# Patient Record
Sex: Male | Born: 1955 | Race: White | Hispanic: No | Marital: Married | Smoking: Never smoker
Health system: Southern US, Community
[De-identification: ages and names within clinical notes are randomized; demographics above are authoritative.]

## PROBLEM LIST (undated history)

## (undated) DIAGNOSIS — M1990 Unspecified osteoarthritis, unspecified site: Secondary | ICD-10-CM

## (undated) DIAGNOSIS — N319 Neuromuscular dysfunction of bladder, unspecified: Secondary | ICD-10-CM

## (undated) DIAGNOSIS — R112 Nausea with vomiting, unspecified: Secondary | ICD-10-CM

## (undated) DIAGNOSIS — S82892A Other fracture of left lower leg, initial encounter for closed fracture: Secondary | ICD-10-CM

## (undated) DIAGNOSIS — K219 Gastro-esophageal reflux disease without esophagitis: Secondary | ICD-10-CM

## (undated) DIAGNOSIS — M4856XA Collapsed vertebra, not elsewhere classified, lumbar region, initial encounter for fracture: Secondary | ICD-10-CM

## (undated) DIAGNOSIS — M79643 Pain in unspecified hand: Secondary | ICD-10-CM

## (undated) DIAGNOSIS — D649 Anemia, unspecified: Secondary | ICD-10-CM

## (undated) DIAGNOSIS — I341 Nonrheumatic mitral (valve) prolapse: Secondary | ICD-10-CM

## (undated) DIAGNOSIS — K592 Neurogenic bowel, not elsewhere classified: Secondary | ICD-10-CM

## (undated) DIAGNOSIS — Z9889 Other specified postprocedural states: Secondary | ICD-10-CM

## (undated) DIAGNOSIS — T4145XA Adverse effect of unspecified anesthetic, initial encounter: Secondary | ICD-10-CM

## (undated) DIAGNOSIS — Z978 Presence of other specified devices: Secondary | ICD-10-CM

## (undated) DIAGNOSIS — I4510 Unspecified right bundle-branch block: Secondary | ICD-10-CM

## (undated) DIAGNOSIS — T8859XA Other complications of anesthesia, initial encounter: Secondary | ICD-10-CM

## (undated) HISTORY — PX: TONSILLECTOMY: SUR1361

## (undated) HISTORY — PX: STRABISMUS SURGERY: SHX218

## (undated) HISTORY — PX: KNEE SURGERY: SHX244

---
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
## 1998-12-15 HISTORY — PX: CERVICAL FUSION: SHX112

## 2011-02-03 ENCOUNTER — Other Ambulatory Visit: Payer: Self-pay | Admitting: Orthopedic Surgery

## 2011-02-03 DIAGNOSIS — M25512 Pain in left shoulder: Secondary | ICD-10-CM

## 2011-02-06 ENCOUNTER — Ambulatory Visit
Admission: RE | Admit: 2011-02-06 | Discharge: 2011-02-06 | Disposition: A | Discharge status: Home / Self Care | Payer: PRIVATE HEALTH INSURANCE | Source: Ambulatory Visit | Attending: Orthopedic Surgery | Admitting: Orthopedic Surgery

## 2011-02-06 DIAGNOSIS — M25512 Pain in left shoulder: Secondary | ICD-10-CM

## 2019-10-15 ENCOUNTER — Emergency Department (HOSPITAL_COMMUNITY): Payer: Commercial Managed Care - PPO

## 2019-10-15 ENCOUNTER — Encounter (HOSPITAL_COMMUNITY): Payer: Self-pay | Admitting: Neurological Surgery

## 2019-10-15 ENCOUNTER — Other Ambulatory Visit: Payer: Self-pay

## 2019-10-15 ENCOUNTER — Inpatient Hospital Stay (HOSPITAL_COMMUNITY)
Admission: EM | Admit: 2019-10-15 | Discharge: 2019-10-25 | DRG: 456 | Disposition: A | Discharge status: Rehabilitation Facility | Payer: Commercial Managed Care - PPO | Attending: Surgery | Admitting: Surgery

## 2019-10-15 ENCOUNTER — Inpatient Hospital Stay (HOSPITAL_COMMUNITY): Payer: Commercial Managed Care - PPO

## 2019-10-15 DIAGNOSIS — S32012A Unstable burst fracture of first lumbar vertebra, initial encounter for closed fracture: Principal | ICD-10-CM | POA: Diagnosis present

## 2019-10-15 DIAGNOSIS — S82235A Nondisplaced oblique fracture of shaft of left tibia, initial encounter for closed fracture: Secondary | ICD-10-CM | POA: Diagnosis present

## 2019-10-15 DIAGNOSIS — Z791 Long term (current) use of non-steroidal anti-inflammatories (NSAID): Secondary | ICD-10-CM

## 2019-10-15 DIAGNOSIS — S32001A Stable burst fracture of unspecified lumbar vertebra, initial encounter for closed fracture: Secondary | ICD-10-CM

## 2019-10-15 DIAGNOSIS — S43015A Anterior dislocation of left humerus, initial encounter: Secondary | ICD-10-CM | POA: Diagnosis present

## 2019-10-15 DIAGNOSIS — S32009A Unspecified fracture of unspecified lumbar vertebra, initial encounter for closed fracture: Secondary | ICD-10-CM | POA: Diagnosis present

## 2019-10-15 DIAGNOSIS — Z09 Encounter for follow-up examination after completed treatment for conditions other than malignant neoplasm: Secondary | ICD-10-CM

## 2019-10-15 DIAGNOSIS — E46 Unspecified protein-calorie malnutrition: Secondary | ICD-10-CM | POA: Diagnosis not present

## 2019-10-15 DIAGNOSIS — G834 Cauda equina syndrome: Secondary | ICD-10-CM | POA: Diagnosis present

## 2019-10-15 DIAGNOSIS — Z800 Family history of malignant neoplasm of digestive organs: Secondary | ICD-10-CM

## 2019-10-15 DIAGNOSIS — S82892A Other fracture of left lower leg, initial encounter for closed fracture: Secondary | ICD-10-CM

## 2019-10-15 DIAGNOSIS — R739 Hyperglycemia, unspecified: Secondary | ICD-10-CM | POA: Diagnosis not present

## 2019-10-15 DIAGNOSIS — S343XXD Injury of cauda equina, subsequent encounter: Secondary | ICD-10-CM | POA: Diagnosis not present

## 2019-10-15 DIAGNOSIS — D62 Acute posthemorrhagic anemia: Secondary | ICD-10-CM | POA: Diagnosis not present

## 2019-10-15 DIAGNOSIS — S43006A Unspecified dislocation of unspecified shoulder joint, initial encounter: Secondary | ICD-10-CM

## 2019-10-15 DIAGNOSIS — Z9889 Other specified postprocedural states: Secondary | ICD-10-CM

## 2019-10-15 DIAGNOSIS — K5900 Constipation, unspecified: Secondary | ICD-10-CM | POA: Diagnosis not present

## 2019-10-15 DIAGNOSIS — Z880 Allergy status to penicillin: Secondary | ICD-10-CM | POA: Diagnosis not present

## 2019-10-15 DIAGNOSIS — T1490XA Injury, unspecified, initial encounter: Secondary | ICD-10-CM

## 2019-10-15 DIAGNOSIS — K592 Neurogenic bowel, not elsewhere classified: Secondary | ICD-10-CM | POA: Diagnosis present

## 2019-10-15 DIAGNOSIS — Z20828 Contact with and (suspected) exposure to other viral communicable diseases: Secondary | ICD-10-CM | POA: Diagnosis present

## 2019-10-15 DIAGNOSIS — M48061 Spinal stenosis, lumbar region without neurogenic claudication: Secondary | ICD-10-CM | POA: Diagnosis present

## 2019-10-15 DIAGNOSIS — W11XXXA Fall on and from ladder, initial encounter: Secondary | ICD-10-CM | POA: Diagnosis present

## 2019-10-15 DIAGNOSIS — M19041 Primary osteoarthritis, right hand: Secondary | ICD-10-CM | POA: Diagnosis present

## 2019-10-15 DIAGNOSIS — M19042 Primary osteoarthritis, left hand: Secondary | ICD-10-CM | POA: Diagnosis present

## 2019-10-15 DIAGNOSIS — S82852A Displaced trimalleolar fracture of left lower leg, initial encounter for closed fracture: Secondary | ICD-10-CM | POA: Diagnosis present

## 2019-10-15 DIAGNOSIS — Y92239 Unspecified place in hospital as the place of occurrence of the external cause: Secondary | ICD-10-CM | POA: Diagnosis not present

## 2019-10-15 DIAGNOSIS — S24103A Unspecified injury at T7-T10 level of thoracic spinal cord, initial encounter: Secondary | ICD-10-CM | POA: Diagnosis not present

## 2019-10-15 DIAGNOSIS — N390 Urinary tract infection, site not specified: Secondary | ICD-10-CM | POA: Diagnosis not present

## 2019-10-15 DIAGNOSIS — Z981 Arthrodesis status: Secondary | ICD-10-CM

## 2019-10-15 DIAGNOSIS — N319 Neuromuscular dysfunction of bladder, unspecified: Secondary | ICD-10-CM | POA: Diagnosis present

## 2019-10-15 DIAGNOSIS — S82832A Other fracture of upper and lower end of left fibula, initial encounter for closed fracture: Secondary | ICD-10-CM

## 2019-10-15 DIAGNOSIS — K644 Residual hemorrhoidal skin tags: Secondary | ICD-10-CM | POA: Diagnosis present

## 2019-10-15 DIAGNOSIS — I341 Nonrheumatic mitral (valve) prolapse: Secondary | ICD-10-CM | POA: Diagnosis present

## 2019-10-15 DIAGNOSIS — S43005A Unspecified dislocation of left shoulder joint, initial encounter: Secondary | ICD-10-CM

## 2019-10-15 DIAGNOSIS — T380X5A Adverse effect of glucocorticoids and synthetic analogues, initial encounter: Secondary | ICD-10-CM | POA: Diagnosis not present

## 2019-10-15 DIAGNOSIS — R339 Retention of urine, unspecified: Secondary | ICD-10-CM | POA: Diagnosis present

## 2019-10-15 DIAGNOSIS — Z79899 Other long term (current) drug therapy: Secondary | ICD-10-CM

## 2019-10-15 DIAGNOSIS — S343XXA Injury of cauda equina, initial encounter: Secondary | ICD-10-CM

## 2019-10-15 DIAGNOSIS — S32011D Stable burst fracture of first lumbar vertebra, subsequent encounter for fracture with routine healing: Secondary | ICD-10-CM | POA: Diagnosis not present

## 2019-10-15 DIAGNOSIS — G8918 Other acute postprocedural pain: Secondary | ICD-10-CM | POA: Diagnosis not present

## 2019-10-15 DIAGNOSIS — S32001S Stable burst fracture of unspecified lumbar vertebra, sequela: Secondary | ICD-10-CM | POA: Diagnosis not present

## 2019-10-15 DIAGNOSIS — Z8249 Family history of ischemic heart disease and other diseases of the circulatory system: Secondary | ICD-10-CM

## 2019-10-15 DIAGNOSIS — I454 Nonspecific intraventricular block: Secondary | ICD-10-CM | POA: Diagnosis present

## 2019-10-15 DIAGNOSIS — S064X0A Epidural hemorrhage without loss of consciousness, initial encounter: Secondary | ICD-10-CM | POA: Diagnosis present

## 2019-10-15 DIAGNOSIS — T148XXA Other injury of unspecified body region, initial encounter: Secondary | ICD-10-CM

## 2019-10-15 DIAGNOSIS — M792 Neuralgia and neuritis, unspecified: Secondary | ICD-10-CM | POA: Diagnosis not present

## 2019-10-15 DIAGNOSIS — Z820 Family history of epilepsy and other diseases of the nervous system: Secondary | ICD-10-CM | POA: Diagnosis not present

## 2019-10-15 DIAGNOSIS — E8809 Other disorders of plasma-protein metabolism, not elsewhere classified: Secondary | ICD-10-CM | POA: Diagnosis not present

## 2019-10-15 DIAGNOSIS — Z419 Encounter for procedure for purposes other than remedying health state, unspecified: Secondary | ICD-10-CM

## 2019-10-15 DIAGNOSIS — M549 Dorsalgia, unspecified: Secondary | ICD-10-CM

## 2019-10-15 DIAGNOSIS — S43492A Other sprain of left shoulder joint, initial encounter: Secondary | ICD-10-CM

## 2019-10-15 DIAGNOSIS — S43492S Other sprain of left shoulder joint, sequela: Secondary | ICD-10-CM | POA: Diagnosis not present

## 2019-10-15 HISTORY — DX: Unspecified right bundle-branch block: I45.10

## 2019-10-15 HISTORY — DX: Nausea with vomiting, unspecified: R11.2

## 2019-10-15 HISTORY — DX: Other specified postprocedural states: Z98.890

## 2019-10-15 HISTORY — DX: Pain in unspecified hand: M79.643

## 2019-10-15 HISTORY — DX: Other complications of anesthesia, initial encounter: T88.59XA

## 2019-10-15 HISTORY — DX: Unspecified injury at T7-t10 level of thoracic spinal cord, initial encounter: S24.103A

## 2019-10-15 HISTORY — DX: Nonrheumatic mitral (valve) prolapse: I34.1

## 2019-10-15 LAB — COMPREHENSIVE METABOLIC PANEL
ALT: 28 U/L (ref 0–44)
AST: 36 U/L (ref 15–41)
Albumin: 3.9 g/dL (ref 3.5–5.0)
Alkaline Phosphatase: 53 U/L (ref 38–126)
Anion gap: 9 (ref 5–15)
BUN: 23 mg/dL (ref 8–23)
CO2: 26 mmol/L (ref 22–32)
Calcium: 9.2 mg/dL (ref 8.9–10.3)
Chloride: 106 mmol/L (ref 98–111)
Creatinine, Ser: 1 mg/dL (ref 0.61–1.24)
GFR calc Af Amer: >60 mL/min (ref >60)
GFR calc non Af Amer: >60 mL/min (ref >60)
Glucose, Bld: 120 mg/dL — ABNORMAL HIGH (ref 70–99)
Potassium: 3.4 mmol/L — ABNORMAL LOW (ref 3.5–5.1)
Sodium: 141 mmol/L (ref 135–145)
Total Bilirubin: 0.6 mg/dL (ref 0.3–1.2)
Total Protein: 6.1 g/dL — ABNORMAL LOW (ref 6.5–8.1)

## 2019-10-15 LAB — SAMPLE TO BLOOD BANK

## 2019-10-15 LAB — CBC
HCT: 39.7 % (ref 39.0–52.0)
Hemoglobin: 13.5 g/dL (ref 13.0–17.0)
MCH: 31.5 pg (ref 26.0–34.0)
MCHC: 34 g/dL (ref 30.0–36.0)
MCV: 92.8 fL (ref 80.0–100.0)
Platelets: 264 10*3/uL (ref 150–400)
RBC: 4.28 MIL/uL (ref 4.22–5.81)
RDW: 12.8 % (ref 11.5–15.5)
WBC: 16 10*3/uL — ABNORMAL HIGH (ref 4.0–10.5)
nRBC: 0 % (ref 0.0–0.2)

## 2019-10-15 LAB — I-STAT CHEM 8, ED
BUN: 22 mg/dL (ref 8–23)
Calcium, Ion: 1.12 mmol/L — ABNORMAL LOW (ref 1.15–1.40)
Chloride: 105 mmol/L (ref 98–111)
Creatinine, Ser: 0.9 mg/dL (ref 0.61–1.24)
Glucose, Bld: 117 mg/dL — ABNORMAL HIGH (ref 70–99)
HCT: 38 % — ABNORMAL LOW (ref 39.0–52.0)
Hemoglobin: 12.9 g/dL — ABNORMAL LOW (ref 13.0–17.0)
Potassium: 3.5 mmol/L (ref 3.5–5.1)
Sodium: 140 mmol/L (ref 135–145)
TCO2: 26 mmol/L (ref 22–32)

## 2019-10-15 LAB — PROTIME-INR
INR: 1.1 (ref 0.8–1.2)
Prothrombin Time: 14.1 seconds (ref 11.4–15.2)

## 2019-10-15 LAB — ETHANOL: Alcohol, Ethyl (B): <10 mg/dL (ref <10)

## 2019-10-15 LAB — SARS CORONAVIRUS 2 BY RT PCR (HOSPITAL ORDER, PERFORMED IN ~~LOC~~ HOSPITAL LAB): SARS Coronavirus 2: NEGATIVE

## 2019-10-15 LAB — CDS SEROLOGY

## 2019-10-15 LAB — LACTIC ACID, PLASMA: Lactic Acid, Venous: 0.9 mmol/L (ref 0.5–1.9)

## 2019-10-15 MED ORDER — ONDANSETRON HCL 4 MG/2ML IJ SOLN
4.0000 mg | INTRAMUSCULAR | Status: DC | PRN
  Administered 2019-10-16: 4 mg via INTRAVENOUS
  Filled 2019-10-15 (×2): qty 2

## 2019-10-15 MED ORDER — LORAZEPAM 2 MG/ML IJ SOLN
1.0000 mg | Freq: Once | INTRAMUSCULAR | Status: AC | PRN
  Administered 2019-10-15: 1 mg via INTRAVENOUS
  Filled 2019-10-15: qty 1

## 2019-10-15 MED ORDER — KETAMINE HCL 10 MG/ML IJ SOLN
INTRAMUSCULAR | Status: AC | PRN
  Administered 2019-10-15: 40 mg via INTRAVENOUS
  Administered 2019-10-15: 20 mg via INTRAVENOUS
  Administered 2019-10-15: 40 mg via INTRAVENOUS

## 2019-10-15 MED ORDER — PROMETHAZINE HCL 25 MG/ML IJ SOLN
25.0000 mg | Freq: Once | INTRAMUSCULAR | Status: AC
  Administered 2019-10-15: 25 mg via INTRAVENOUS
  Filled 2019-10-15: qty 1

## 2019-10-15 MED ORDER — IOHEXOL 300 MG/ML  SOLN
100.0000 mL | Freq: Once | INTRAMUSCULAR | Status: AC | PRN
  Administered 2019-10-15: 100 mL via INTRAVENOUS

## 2019-10-15 MED ORDER — PROPOFOL 10 MG/ML IV BOLUS
60.0000 mg | Freq: Once | INTRAVENOUS | Status: DC
  Filled 2019-10-15: qty 20

## 2019-10-15 MED ORDER — FENTANYL CITRATE (PF) 100 MCG/2ML IJ SOLN
INTRAMUSCULAR | Status: AC | PRN
  Administered 2019-10-15: 50 ug via INTRAVENOUS

## 2019-10-15 MED ORDER — KETAMINE HCL 50 MG/5ML IJ SOSY
1.0000 mg/kg | PREFILLED_SYRINGE | Freq: Once | INTRAMUSCULAR | Status: DC
  Filled 2019-10-15: qty 10

## 2019-10-15 MED ORDER — KETAMINE HCL 50 MG/5ML IJ SOSY
0.3000 mg/kg | PREFILLED_SYRINGE | Freq: Once | INTRAMUSCULAR | Status: AC
  Administered 2019-10-15: 21 mg via INTRAVENOUS
  Filled 2019-10-15: qty 5

## 2019-10-15 MED ORDER — ONDANSETRON HCL 4 MG/2ML IJ SOLN
4.0000 mg | Freq: Once | INTRAMUSCULAR | Status: AC
  Administered 2019-10-15: 16:00:00 4 mg via INTRAVENOUS

## 2019-10-15 MED ORDER — HYDROMORPHONE HCL 1 MG/ML IJ SOLN
1.0000 mg | INTRAMUSCULAR | Status: DC | PRN
  Administered 2019-10-16 (×3): 1 mg via INTRAVENOUS
  Filled 2019-10-15 (×3): qty 1

## 2019-10-15 MED ORDER — PROPOFOL 10 MG/ML IV BOLUS
INTRAVENOUS | Status: AC | PRN
  Administered 2019-10-15 (×2): 40 mg via INTRAVENOUS

## 2019-10-15 MED ORDER — ONDANSETRON HCL 4 MG/2ML IJ SOLN
INTRAMUSCULAR | Status: AC
  Administered 2019-10-15: 4 mg via INTRAVENOUS
  Filled 2019-10-15: qty 2

## 2019-10-15 NOTE — ED Provider Notes (Signed)
[facility] EMERGENCY DEPARTMENT Provider Note   CSN: [id number] Arrival date & time: 10/15/19  1528     History   Chief Complaint Chief Complaint  Patient presents with  . Fall    HPI Torris Matsuoka is a 63 y.o. male.     HPI   Patient presents today after a fall while working on a 20 foot ladder, with a fall that was estimated to be about 12 feet. He apparently fell with a ladder underneath him, on his back, with parts of his body on the pavement. He states his last tetanus shot was 2 years ago. He is currently complaining of pain to his left shoulder as well as his left lower extremity. He received 250 mcg of fentanyl from EMS as well as 4 mg Zofran. He arrives to the ED GCS 15, satting well on room air, hemodynamically stable.  History reviewed. No pertinent past medical history.  Patient Active Problem List   Diagnosis Date Noted  . Fracture of lumbar spine without lesion of spinal cord (HCC) 10/15/2019    History reviewed. No pertinent surgical history.    Home Medications    Prior to Admission medications   Medication Sig Start Date End Date Taking? Authorizing Provider  ibuprofen (ADVIL) 200 MG tablet Take 400 mg by mouth every 6 (six) hours as needed for headache.   Yes [provider]  Multiple Vitamins-Minerals (MULTIVITAMIN ADULT PO) Take 1 tablet by mouth daily.   Yes [provider]  Naproxen Sod-diphenhydrAMINE (ALEVE PM PO) Take 2 tablets by mouth as needed (sleep).   Yes [provider]    Family History History reviewed. No pertinent family history.  Social History Social History   Tobacco Use  . Smoking status: Not on file  Substance Use Topics  . Alcohol use: Not on file  . Drug use: Not on file     Allergies   Penicillins   Review of Systems Review of Systems  Constitutional: Negative for fever.  Eyes: Negative for visual disturbance.  Respiratory: Negative for cough and shortness of breath.    Cardiovascular: Negative for chest pain.  Gastrointestinal: Negative for abdominal pain, nausea and vomiting.  Genitourinary: Negative for hematuria.  Musculoskeletal: Positive for back pain and gait problem.  Skin: Positive for wound. Negative for rash.  Neurological: Negative for syncope and headaches.  All other systems reviewed and are negative.    Physical Exam Updated Vital Signs BP 138/68   Pulse 70   Temp (!) 97.1 F (36.2 C) (Temporal)   Resp 13   Ht 5' 11.5" (1.816 m)   Wt 70.3 kg   SpO2 100%   BMI 21.32 kg/m   Physical Exam Vitals signs and nursing note reviewed.  Constitutional:      Appearance: He is well-developed.  HENT:     Head: Normocephalic and atraumatic.  Eyes:     Extraocular Movements: Extraocular movements intact.     Conjunctiva/sclera: Conjunctivae normal.     Pupils: Pupils are equal, round, and reactive to light.  Neck:     Musculoskeletal: Neck supple.     Comments: Cervical collar in place Cardiovascular:     Rate and Rhythm: Normal rate and regular rhythm.     Pulses: Normal pulses.     Heart sounds: No murmur.     Comments: 2+ DP pulses bilaterally, 2+ radial pulses bilaterally Pulmonary:     Effort: Pulmonary effort is normal. No respiratory distress.  Breath sounds: Normal breath sounds.     Comments: Chest wall stable to AP and lateral compression, satting well on room air Abdominal:     General: There is no distension.     Palpations: Abdomen is soft.     Tenderness: There is no abdominal tenderness. There is no right CVA tenderness, left CVA tenderness or guarding.     Comments: No flank tenderness to palpation  Genitourinary:    Comments: Pelvis stable to AP and lateral compression Musculoskeletal:        General: Deformity present.     Comments: Abrasion to left lower extremity, obvious deformity to the left lower extremity  Skin:    General: Skin is warm and dry.  Neurological:     Mental Status: He is alert and  oriented to person, place, and time.     Comments: No midline spinal tenderness to palpation, able to move all 4 extremities spontaneously      ED Treatments / Results  Labs (all labs ordered are listed, but only abnormal results are displayed) Labs Reviewed  COMPREHENSIVE METABOLIC PANEL - Abnormal; Notable for the following components:      Result Value   Potassium 3.4 (*)    Glucose, Bld 120 (*)    Total Protein 6.1 (*)    All other components within normal limits  CBC - Abnormal; Notable for the following components:   WBC 16.0 (*)    All other components within normal limits  I-STAT CHEM 8, ED - Abnormal; Notable for the following components:   Glucose, Bld 117 (*)    Calcium, Ion 1.12 (*)    Hemoglobin 12.9 (*)    HCT 38.0 (*)    All other components within normal limits  SARS CORONAVIRUS 2 BY RT PCR (HOSPITAL ORDER, PERFORMED IN [place] [facility])  CDS SEROLOGY  ETHANOL  LACTIC ACID, PLASMA  PROTIME-INR  URINALYSIS, ROUTINE W REFLEX MICROSCOPIC  HIV ANTIBODY (ROUTINE TESTING W REFLEX)  SAMPLE TO BLOOD BANK    EKG None  Radiology Dg Tibia/fibula Left  Result Date: 10/15/2019 CLINICAL DATA:  Pt arrives via EMS from home with reports of working on a 20 ft ladder when it slipped out and he fell on his back on the pavement. Estimated falling 12 ft. Denies LOC. Deformity to left ankle and dislocation to left shoulder EXAM: LEFT TIBIA AND FIBULA - 2 VIEW COMPARISON:  None. FINDINGS: Ankle fractures. There is a transverse fracture across the distal fibula, at the metadiaphysis. There is an oblique fracture across the medial aspect of the distal tibia extending to the medial tibial plafond, with flexure displaced proximally 5 mm medially and superiorly. There is widening of the lateral margin of the ankle joint. No talar subluxation. No additional fractures.  Knee joint normally aligned. 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  IMPRESSION: 1. Ankle fractures with oblique fracture across the medial distal tibia, above the base of the medial malleolus, and a lateral fracture of the distal fibula. 2. No other fractures.  No dislocation. 3. 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 Electronically Signed   By: Amie Portland M.D.   On: 10/15/2019 16:19   Dg Ankle Complete Left  Result Date: 10/15/2019 CLINICAL DATA:  Status post reduction EXAM: LEFT ANKLE COMPLETE - 3+ VIEW COMPARISON:  October 15, 2019 FINDINGS: The patient has been placed in a cast. The patient's trimalleolar fracture demonstrates similar alignment in the interval. IMPRESSION: The patient has been placed in a  cast. No significant change in alignment. Electronically Signed   By: Gerome Sam III M.D   On: 10/15/2019 18:13   Ct Head Wo Contrast  Result Date: 10/15/2019 CLINICAL DATA:  Was working on a 20 foot ladder at home when it slipped out and he fell onto his back on pavement, estimated fall height 12 feet, denies loss of consciousness EXAM: CT HEAD WITHOUT CONTRAST CT CERVICAL SPINE WITHOUT CONTRAST TECHNIQUE: Multidetector CT imaging of the head and cervical spine was performed following the standard protocol without intravenous contrast. Multiplanar CT image reconstructions of the cervical spine were also generated. COMPARISON:  None FINDINGS: CT HEAD FINDINGS Brain: Normal ventricular morphology. No midline shift or mass effect. Normal appearance of brain parenchyma. No intracranial hemorrhage, mass lesion or evidence of acute infarction. No extra-axial fluid collections. Vascular: Minimal atherosclerotic calcification of internal carotid and vertebral arteries at skull base Skull: Intact Sinuses/Orbits: Clear Other: N/A CT CERVICAL SPINE FINDINGS Alignment: Normal Skull base and vertebrae: Mild osseous demineralization. Skull base intact. Prior anterior fusion C5-C7 with good bony fusion. Vertebral body heights maintained. Disc space narrowing greatest at C4-C5  with minimal retrolisthesis. Mild scattered facet degenerative changes greatest at C7-T1. No fracture, additional subluxation or bone destruction. Or Soft tissues and spinal canal: Prevertebral soft tissues normal thickness. Remaining visualized cervical soft tissues unremarkable. Disc levels:  No specific abnormalities Upper chest: Lung apices clear Other: N/A IMPRESSION: No acute intracranial abnormalities. Prior anterior C5-C7 cervical spine fusion. Mild scattered degenerative disc and facet disease changes cervical spine. No acute cervical spine abnormalities. Electronically Signed   By: Ulyses Southward M.D.   On: 10/15/2019 16:54   Ct Chest W Contrast  Result Date: 10/15/2019 CLINICAL DATA:  Larey Seat off ladder today. EXAM: CT CHEST, ABDOMEN, AND PELVIS WITH CONTRAST TECHNIQUE: Multidetector CT imaging of the chest, abdomen and pelvis was performed following the standard protocol during bolus administration of intravenous contrast. CONTRAST:  OMNIPAQUE IOHEXOL 300 MG/ML  SOLN COMPARISON:  None. FINDINGS: CT CHEST FINDINGS Cardiovascular: The heart is normal in size. No pericardial effusion. Mild tortuosity, ectasia and calcification of the thoracic aorta but no aneurysm or dissection. The branch vessels are patent. Scattered coronary artery calcifications are noted. Mediastinum/Nodes: No mediastinal or hilar mass or lymphadenopathy. No mediastinal hematoma. The esophagus is grossly normal. Lungs/Pleura: No acute pulmonary findings. No pulmonary contusions or pneumothorax. Dependent bibasilar subpleural atelectasis. No pleural effusion. No worrisome pulmonary lesions. A few small pulmonary nodules are likely benign. Mild bronchiectasis is noted. No interstitial lung disease. Musculoskeletal: No chest wall mass, supraclavicular or axillary adenopathy. The thoracic vertebral bodies are normally aligned. No acute fracture. The sternum is intact. CT ABDOMEN PELVIS FINDINGS Hepatobiliary: No focal hepatic lesions  or acute hepatic injury. No perihepatic fluid collections. The gallbladder is normal. No common bile duct dilatation. Pancreas: No mass, inflammation or ductal dilatation. No acute injury or peripancreatic fluid collection. Spleen: Normal size. No acute injury or perisplenic fluid collection. Adrenals/Urinary Tract: The adrenal glands and kidneys are unremarkable. No acute renal injury or perinephric fluid collection. The bladder is unremarkable. Stomach/Bowel: The stomach, duodenum, small bowel and colon are grossly normal without oral contrast. No obvious acute inflammatory process, mass lesion or obstructive findings. No free air is identified. Vascular/Lymphatic: Moderate atherosclerotic calcifications involving the distal aorta and iliac arteries. No aneurysm or dissection. The major venous structures are patent. No mesenteric or retroperitoneal mass, adenopathy or hematoma. Reproductive: The prostate gland and seminal vesicles are unremarkable. Other: No free pelvic fluid  collections, pelvic hematoma or inguinal mass. Musculoskeletal: There is a significant burst type fracture of L1 with retropulsion of the posterosuperior aspect of the vertebral body into the spinal canal. There is approximately 50-60% canal compromise. The pedicles are intact but there is a fracture extending through the left lamina and the left facet joint and left transverse process. I do not see any definite fractures above or below this level. Paraspinal hematoma is noted along with some blood extending up into the retrocrural space. The posterior ribs are intact. IMPRESSION: 1. Significant burst type fracture of L1 with retropulsion and spinal canal compromise. 2. There is also involvement of the posterior elements on the left side. 3. No acute intra-abdominal/intrapelvic injury is identified. The solid abdominal organs are intact and no definite findings for bowel injury. 4. Bibasilar atelectasis but no acute lung injury. 5. Normal  appearance of the heart and great vessels. These results were called by telephone at the time of interpretation on 10/15/2019 at 4:57 pm to provider [facility] , who verbally acknowledged these results. Electronically Signed   By: Rudie Meyer M.D.   On: 10/15/2019 16:58   Ct Cervical Spine Wo Contrast  Result Date: 10/15/2019 CLINICAL DATA:  Was working on a 20 foot ladder at home when it slipped out and he fell onto his back on pavement, estimated fall height 12 feet, denies loss of consciousness EXAM: CT HEAD WITHOUT CONTRAST CT CERVICAL SPINE WITHOUT CONTRAST TECHNIQUE: Multidetector CT imaging of the head and cervical spine was performed following the standard protocol without intravenous contrast. Multiplanar CT image reconstructions of the cervical spine were also generated. COMPARISON:  None FINDINGS: CT HEAD FINDINGS Brain: Normal ventricular morphology. No midline shift or mass effect. Normal appearance of brain parenchyma. No intracranial hemorrhage, mass lesion or evidence of acute infarction. No extra-axial fluid collections. Vascular: Minimal atherosclerotic calcification of internal carotid and vertebral arteries at skull base Skull: Intact Sinuses/Orbits: Clear Other: N/A CT CERVICAL SPINE FINDINGS Alignment: Normal Skull base and vertebrae: Mild osseous demineralization. Skull base intact. Prior anterior fusion C5-C7 with good bony fusion. Vertebral body heights maintained. Disc space narrowing greatest at C4-C5 with minimal retrolisthesis. Mild scattered facet degenerative changes greatest at C7-T1. No fracture, additional subluxation or bone destruction. Or Soft tissues and spinal canal: Prevertebral soft tissues normal thickness. Remaining visualized cervical soft tissues unremarkable. Disc levels:  No specific abnormalities Upper chest: Lung apices clear Other: N/A IMPRESSION: No acute intracranial abnormalities. Prior anterior C5-C7 cervical spine fusion. Mild scattered degenerative  disc and facet disease changes cervical spine. No acute cervical spine abnormalities. Electronically Signed   By: Ulyses Southward M.D.   On: 10/15/2019 16:54   Mr Lumbar Spine Wo Contrast  Result Date: 10/15/2019 CLINICAL DATA:  12 feet fall from ladder. L1 burst fracture. EXAM: MRI LUMBAR SPINE WITHOUT CONTRAST TECHNIQUE: Multiplanar, multisequence MR imaging of the lumbar spine was performed. No intravenous contrast was administered. COMPARISON:  CT of the abdomen and pelvis contrast 10/15/2019 FINDINGS: Segmentation: 5 non rib-bearing lumbar type vertebral bodies are present. The lowest fully formed vertebral body is L5. Alignment: There straightening of the normal lumbar lordosis. No significant listhesis is present. Vertebrae: L1 burst fracture is again noted. Retropulsed bone and epidural hematoma result in central canal stenosis at the L1 level. This is at the tip of the conus medullaris. A more subtle superior endplate fractures present at L3. There is some edema within the upper half of the vertebral body. Minimally displaced superior endplate compression fracture  is more evident right than left. Marrow signal and vertebral body heights are preserved at L2, L4, and L5. Conus medullaris and cauda equina: Conus extends to the L1 level. Conus and cauda equina appear normal. Paraspinal and other soft tissues: There is a thin paraspinal hematoma no impact is present at L1. On the retroperitoneum. Visualized intra-abdominal organs are within normal limits. Paraspinous musculature is unremarkable. Disc levels: Moderate to severe central canal stenosis is present at L1 secondary to retropulsion of bone fragments and epidural hematoma. The epidural hematoma extends to the L2 vertebral body. L1-2: Moderate left subarticular narrowing is secondary to the epidural hematoma. Foramina are patent. L2-3: Mild broad-based disc bulge is present. No significant stenosis is present. L3-4: There is desiccation of the disc  consent loss of height without significant stenosis. L4-5: Mild disc bulging is present at. A far right annular tear is noted. There is no significant stenosis. L5-S1: Negative. IMPRESSION: 1. L1 burst fracture with retropulsion of bone fragments and epidural hematoma resulting in moderate to severe central canal stenosis at L1. 2. Minimally displaced acute superior endplate compression fracture at L3 without retropulsed bone or significant stenosis. 3. Moderate left subarticular stenosis at L1-2 secondary to the epidural hematoma. 4. Mild disc bulging at L2-3 and L3-4 without significant stenosis at either level. Electronically Signed   By: Marin Roberts M.D.   On: 10/15/2019 20:40   Ct Abdomen Pelvis W Contrast  Result Date: 10/15/2019 CLINICAL DATA:  Larey Seat off ladder today. EXAM: CT CHEST, ABDOMEN, AND PELVIS WITH CONTRAST TECHNIQUE: Multidetector CT imaging of the chest, abdomen and pelvis was performed following the standard protocol during bolus administration of intravenous contrast. CONTRAST:  OMNIPAQUE IOHEXOL 300 MG/ML  SOLN COMPARISON:  None. FINDINGS: CT CHEST FINDINGS Cardiovascular: The heart is normal in size. No pericardial effusion. Mild tortuosity, ectasia and calcification of the thoracic aorta but no aneurysm or dissection. The branch vessels are patent. Scattered coronary artery calcifications are noted. Mediastinum/Nodes: No mediastinal or hilar mass or lymphadenopathy. No mediastinal hematoma. The esophagus is grossly normal. Lungs/Pleura: No acute pulmonary findings. No pulmonary contusions or pneumothorax. Dependent bibasilar subpleural atelectasis. No pleural effusion. No worrisome pulmonary lesions. A few small pulmonary nodules are likely benign. Mild bronchiectasis is noted. No interstitial lung disease. Musculoskeletal: No chest wall mass, supraclavicular or axillary adenopathy. The thoracic vertebral bodies are normally aligned. No acute fracture. The sternum is  intact. CT ABDOMEN PELVIS FINDINGS Hepatobiliary: No focal hepatic lesions or acute hepatic injury. No perihepatic fluid collections. The gallbladder is normal. No common bile duct dilatation. Pancreas: No mass, inflammation or ductal dilatation. No acute injury or peripancreatic fluid collection. Spleen: Normal size. No acute injury or perisplenic fluid collection. Adrenals/Urinary Tract: The adrenal glands and kidneys are unremarkable. No acute renal injury or perinephric fluid collection. The bladder is unremarkable. Stomach/Bowel: The stomach, duodenum, small bowel and colon are grossly normal without oral contrast. No obvious acute inflammatory process, mass lesion or obstructive findings. No free air is identified. Vascular/Lymphatic: Moderate atherosclerotic calcifications involving the distal aorta and iliac arteries. No aneurysm or dissection. The major venous structures are patent. No mesenteric or retroperitoneal mass, adenopathy or hematoma. Reproductive: The prostate gland and seminal vesicles are unremarkable. Other: No free pelvic fluid collections, pelvic hematoma or inguinal mass. Musculoskeletal: There is a significant burst type fracture of L1 with retropulsion of the posterosuperior aspect of the vertebral body into the spinal canal. There is approximately 50-60% canal compromise. The pedicles are intact but there is  a fracture extending through the left lamina and the left facet joint and left transverse process. I do not see any definite fractures above or below this level. Paraspinal hematoma is noted along with some blood extending up into the retrocrural space. The posterior ribs are intact. IMPRESSION: 1. Significant burst type fracture of L1 with retropulsion and spinal canal compromise. 2. There is also involvement of the posterior elements on the left side. 3. No acute intra-abdominal/intrapelvic injury is identified. The solid abdominal organs are intact and no definite findings for  bowel injury. 4. Bibasilar atelectasis but no acute lung injury. 5. Normal appearance of the heart and great vessels. These results were called by telephone at the time of interpretation on 10/15/2019 at 4:57 pm to provider [facility] , who verbally acknowledged these results. Electronically Signed   By: Rudie Meyer M.D.   On: 10/15/2019 16:58   Dg Pelvis Portable  Result Date: 10/15/2019 CLINICAL DATA:  Fall off ladder from 12 ft height. Pelvic pain. Initial encounter. EXAM: PORTABLE PELVIS 1-2 VIEWS COMPARISON:  None. FINDINGS: There is no evidence of pelvic fracture or diastasis. No pelvic bone lesions are seen. IMPRESSION: Negative. Electronically Signed   By: Danae Orleans M.D.   On: 10/15/2019 16:18   Dg Chest Port 1 View  Result Date: 10/15/2019 CLINICAL DATA:  Fall off ladder from 12 ft height. Chest pain. Left shoulder dislocation. Initial encounter. EXAM: PORTABLE CHEST 1 VIEW COMPARISON:  None. FINDINGS: The heart size and mediastinal contours are within normal limits. No evidence of pneumothorax or hemothorax. Both lungs are clear. Left shoulder dislocation noted. Cervical spine fusion hardware is seen. IMPRESSION: No active cardiopulmonary disease. Left shoulder dislocation. Electronically Signed   By: Danae Orleans M.D.   On: 10/15/2019 16:20   Dg Shoulder Left Portable  Result Date: 10/15/2019 CLINICAL DATA:  Post reduction images. EXAM: LEFT SHOULDER - 1 VIEW COMPARISON:  Pre reduction images, 10/15/2019 at 3:49 p.m. FINDINGS: Humeral head has been realigned with the glenoid. No fracture is seen. IMPRESSION: Successful reduction of the left shoulder dislocation. Electronically Signed   By: Amie Portland M.D.   On: 10/15/2019 18:06   Dg Shoulder Left Portable  Result Date: 10/15/2019 CLINICAL DATA:  Fall off ladder from 12 ft height. Left shoulder pain. Initial encounter. EXAM: LEFT SHOULDER - 1 VIEW COMPARISON:  None. FINDINGS: Anterior dislocation of the humeral head is seen.  No fracture or other bone lesions identified. IMPRESSION: Anterior shoulder dislocation.  No evidence of fracture. Electronically Signed   By: Danae Orleans M.D.   On: 10/15/2019 16:19    Procedures .Sedation  Date/Time: 10/15/2019 4:53 PM Performed by: Chester Holstein, MD Authorized by: Derwood Kaplan, MD   Consent:    Consent obtained:  Written and verbal   Consent given by:  Patient and spouse   Risks discussed:  Respiratory compromise necessitating ventilatory assistance and intubation, nausea, dysrhythmia and vomiting Universal protocol:    Immediately prior to procedure a time out was called: yes   Indications:    Procedure performed:  Fracture reduction Pre-sedation assessment:    Time since last food or drink:  4   ASA classification: class 1 - normal, healthy patient     Neck mobility: reduced (Cervical collar in place)     Mouth opening:  3 or more finger widths   Thyromental distance:  4 finger widths   Mallampati score:  I - soft palate, uvula, fauces, pillars visible   Pre-sedation assessments completed and  reviewed: airway patency, cardiovascular function, hydration status, nausea/vomiting and pain level     Pre-sedation assessment completed:  10/15/2019 4:54 PM Immediate pre-procedure details:    Reassessment: Patient reassessed immediately prior to procedure     Reviewed: vital signs     Verified: bag valve mask available, intubation equipment available, IV patency confirmed, oxygen available and suction available   Procedure details (see MAR for exact dosages):    Preoxygenation:  Nasal cannula   Sedation:  Propofol and ketamine   Intended level of sedation: deep   Intra-procedure monitoring:  Blood pressure monitoring, continuous pulse oximetry, cardiac monitor, frequent LOC assessments and frequent vital sign checks   Total Provider sedation time (minutes):  20 Post-procedure details:    Post-sedation assessment completed:  10/15/2019 6:00 PM    Post-sedation assessments completed and reviewed: airway patency, mental status, nausea/vomiting, pain level and respiratory function     Patient is stable for discharge or admission: yes     Patient tolerance:  Tolerated well, no immediate complications Reduction of dislocation  Date/Time: 10/15/2019 4:55 PM Performed by: Chester Holstein, MD Authorized by: Derwood Kaplan, MD  Consent: Verbal consent obtained. Consent given by: patient and spouse Patient understanding: patient states understanding of the procedure being performed Patient consent: the patient's understanding of the procedure matches consent given Procedure consent: procedure consent matches procedure scheduled Required items: required blood products, implants, devices, and special equipment available Patient identity confirmed: verbally with patient and arm band Time out: Immediately prior to procedure a "time out" was called to verify the correct patient, procedure, equipment, support staff and site/side marked as required. Local anesthesia used: no  Anesthesia: Local anesthesia used: no  Sedation: Patient sedated: yes Sedation type: moderate (conscious) sedation Sedatives: propofol Analgesia: ketamine Vitals: Vital signs were monitored during sedation.  Patient tolerance: patient tolerated the procedure well with no immediate complications  Reduction of fracture  Date/Time: 10/15/2019 5:46 PM Performed by: Chester Holstein, MD Authorized by: Derwood Kaplan, MD  Consent: Verbal consent obtained. Consent given by: patient and spouse Patient understanding: patient states understanding of the procedure being performed Patient consent: the patient's understanding of the procedure matches consent given Required items: required blood products, implants, devices, and special equipment available Patient identity confirmed: verbally with patient and arm band Time out: Immediately prior to procedure a "time out"  was called to verify the correct patient, procedure, equipment, support staff and site/side marked as required. Local anesthesia used: no  Anesthesia: Local anesthesia used: no  Sedation: Patient sedated: yes Sedatives: propofol Analgesia: ketamine Vitals: Vital signs were monitored during sedation.  Patient tolerance: patient tolerated the procedure well with no immediate complications    (including critical care time)  Medications Ordered in ED Medications  HYDROmorphone (DILAUDID) injection 1 mg (has no administration in time range)  ondansetron (ZOFRAN) injection 4 mg (has no administration in time range)  ketamine 50 mg in normal saline 5 mL (10 mg/mL) syringe (21 mg Intravenous Given 10/15/19 1548)  ondansetron (ZOFRAN) injection 4 mg (4 mg Intravenous Given 10/15/19 1549)  fentaNYL (SUBLIMAZE) injection (50 mcg Intravenous Given 10/15/19 1535)  promethazine (PHENERGAN) injection 25 mg (25 mg Intravenous Given 10/15/19 1614)  iohexol (OMNIPAQUE) 300 MG/ML solution 100 mL (100 mLs Intravenous Contrast Given 10/15/19 1628)  ketamine (KETALAR) injection (40 mg Intravenous Given 10/15/19 1732)  propofol (DIPRIVAN) 10 mg/mL bolus/IV push (40 mg Intravenous Given 10/15/19 1732)  promethazine (PHENERGAN) injection 25 mg (25 mg Intravenous Given 10/15/19 1913)  LORazepam (ATIVAN) injection 1  mg (1 mg Intravenous Given 10/15/19 1959)     Initial Impression / Assessment and Plan / ED Course  I have reviewed the triage vital signs and the nursing notes.  Pertinent labs & imaging results that were available during my care of the patient were reviewed by me and considered in my medical decision making (see chart for details).        Amorion Oberlin is a 63 y.o. male presents today after a fall from 12 feet.  Trauma scans ordered, IV pain medication, nausea medication given soon after arrival.  Cervical collar in placed.    Labs are consistent with trauma, imaging consistent with  anterior shoulder dislocation, left trimalleolar fracture and tib-fib fracture.  No open fractures.  Shoulder dislocation reduced per above procedure note.  Left lower extremity fractures splinted.    Imaging also showed a bony retropulsion of L1.  Neurosurgery consulted, and they recommended admission with likely intervention in the morning.  Trauma and orthopedics called.  Admitted to the trauma service.  Care of patient was discussed with the supervising  Final Clinical Impressions(s) / ED Diagnoses   Final diagnoses:  Trauma  Shoulder dislocation, left, initial encounter  Closed burst fracture of lumbar vertebra, initial encounter (HCC)  Closed fracture of left ankle, initial encounter  Closed nondisplaced oblique fracture of shaft of left tibia, initial encounter  Closed fracture of distal end of left fibula, unspecified fracture morphology, initial encounter    ED Discharge Orders    None       Chester Holstein, MD 10/15/19 2322    Derwood Kaplan, MD 10/16/19 1943

## 2019-10-15 NOTE — Progress Notes (Signed)
Orthopedic Tech Progress Note Patient Details:  Louis Jordan 10/10/1956 [id number]  Ortho Devices Ortho Device/Splint Location: level 2 Trauma       [place] Pink 10/15/2019, 3:45 PM

## 2019-10-15 NOTE — Consult Note (Signed)
Reason for Consult: L1 burst fracture Referring Physician: EDP  Louis Jordan is an 63 y.o. male.   HPI:  63 year old gentleman who apparently fell 12 feet from a ladder and has suffered a left shoulder dislocation, left ankle fracture, and an L1 burst fracture.  The patient is sedated and cannot operate with history and physical exam.  EDP assures me that the patient had normal sensation and normal strength prior to sedation for reduction of his dislocation and fracture.  Was complaining of back pain but no leg pain.  History reviewed. No pertinent past medical history.  History reviewed. No pertinent surgical history.  Allergies  Allergen Reactions  . Penicillins     Social History   Tobacco Use  . Smoking status: Not on file  Substance Use Topics  . Alcohol use: Not on file    History reviewed. No pertinent family history.   Review of Systems  Positive ROS: Negative per his wife  All other systems have been reviewed and were otherwise negative with the exception of those mentioned in the HPI and as above.  Objective: Vital signs in last 24 hours: Temp:  [97.1 F (36.2 C)] 97.1 F (36.2 C) (10/31 1533) Pulse Rate:  [66-76] 72 (10/31 1735) Resp:  [8-26] 8 (10/31 1735) BP: (148-179)/(71-86) 159/85 (10/31 1735) SpO2:  [93 %-100 %] 99 % (10/31 1735) Weight:  [70.3 kg] 70.3 kg (10/31 1540)  General Appearance: Lethargic, unable to cooperate with exam, no distress, appears stated age Head: Normocephalic, without obvious abnormality, atraumatic Eyes: PERRL, conjunctiva/corneas clear, gaze conjugate Ears: Normal TM's and external ear canals, both ears Throat: benign Neck: In collar Lungs: respirations unlabored Heart: Regular rate and rhythm Abdomen: Soft Extremities: Deformity of left ankle Pulses: 2+ and symmetric all extremities Skin: Skin color, texture, turgor normal, no rashes or lesions  NEUROLOGIC:   Mental status: Unable to fully assess because of  medications Motor Exam - grossly normal, normal tone and bulk cording to the EDP but unable to assess at present Sensory Exam -unable to assess Reflexes: symmetric, no pathologic reflexes, No Hoffman's, No clonus Coordination -unable to assess Gait -unable to assess Balance -unable to assess Cranial nerves I: smell Not tested  II: visual acuity  OS: na    OD: na  II: visual fields   II: pupils Equal, round, reactive to light  III,VII: ptosis None  III,IV,VI: extraocular muscles  Full ROM  V: mastication Normal  V: facial light touch sensation  Normal  V,VII: corneal reflex  Present  VII: facial muscle function - upper  Normal  VII: facial muscle function - lower Normal  VIII: hearing Not tested  IX: soft palate elevation  Normal  IX,X: gag reflex Present  XI: trapezius strength  5/5  XI: sternocleidomastoid strength 5/5  XI: neck flexion strength  5/5  XII: tongue strength  Normal    Data Review Lab Results  Component Value Date   WBC 16.0 (H) 10/15/2019   HGB 12.9 (L) 10/15/2019   HCT 38.0 (L) 10/15/2019   MCV 92.8 10/15/2019   PLT 264 10/15/2019   Lab Results  Component Value Date   NA 140 10/15/2019   K 3.5 10/15/2019   CL 105 10/15/2019   CO2 26 10/15/2019   BUN 22 10/15/2019   CREATININE 0.90 10/15/2019   GLUCOSE 117 (H) 10/15/2019   Lab Results  Component Value Date   INR 1.1 10/15/2019    Radiology: Dg Tibia/fibula Left  Result Date: 10/15/2019 CLINICAL DATA:  Pt arrives via EMS from home with reports of working on a 20 ft ladder when it slipped out and he fell on his back on the pavement. Estimated falling 12 ft. Denies LOC. Deformity to left ankle and dislocation to left shoulder EXAM: LEFT TIBIA AND FIBULA - 2 VIEW COMPARISON:  None. FINDINGS: Ankle fractures. There is a transverse fracture across the distal fibula, at the metadiaphysis. There is an oblique fracture across the medial aspect of the distal tibia extending to the medial tibial plafond,  with flexure displaced proximally 5 mm medially and superiorly. There is widening of the lateral margin of the ankle joint. No talar subluxation. No additional fractures.  Knee joint normally aligned. 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 IMPRESSION: 1. Ankle fractures with oblique fracture across the medial distal tibia, above the base of the medial malleolus, and a lateral fracture of the distal fibula. 2. No other fractures.  No dislocation. 3. 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 Electronically Signed   By: Amie Portlandavid  Ormond M.D.   On: 10/15/2019 16:19   Ct Head Wo Contrast  Result Date: 10/15/2019 CLINICAL DATA:  Was working on a 20 foot ladder at home when it slipped out and he fell onto his back on pavement, estimated fall height 12 feet, denies loss of consciousness EXAM: CT HEAD WITHOUT CONTRAST CT CERVICAL SPINE WITHOUT CONTRAST TECHNIQUE: Multidetector CT imaging of the head and cervical spine was performed following the standard protocol without intravenous contrast. Multiplanar CT image reconstructions of the cervical spine were also generated. COMPARISON:  None FINDINGS: CT HEAD FINDINGS Brain: Normal ventricular morphology. No midline shift or mass effect. Normal appearance of brain parenchyma. No intracranial hemorrhage, mass lesion or evidence of acute infarction. No extra-axial fluid collections. Vascular: Minimal atherosclerotic calcification of internal carotid and vertebral arteries at skull base Skull: Intact Sinuses/Orbits: Clear Other: N/A CT CERVICAL SPINE FINDINGS Alignment: Normal Skull base and vertebrae: Mild osseous demineralization. Skull base intact. Prior anterior fusion C5-C7 with good bony fusion. Vertebral body heights maintained. Disc space narrowing greatest at C4-C5 with minimal retrolisthesis. Mild scattered facet degenerative changes greatest at C7-T1. No fracture, additional subluxation or bone destruction. Or Soft tissues and spinal canal:  Prevertebral soft tissues normal thickness. Remaining visualized cervical soft tissues unremarkable. Disc levels:  No specific abnormalities Upper chest: Lung apices clear Other: N/A IMPRESSION: No acute intracranial abnormalities. Prior anterior C5-C7 cervical spine fusion. Mild scattered degenerative disc and facet disease changes cervical spine. No acute cervical spine abnormalities. Electronically Signed   By: Ulyses SouthwardMark  Boles M.D.   On: 10/15/2019 16:54   Ct Chest W Contrast  Result Date: 10/15/2019 CLINICAL DATA:  Larey SeatFell off ladder today. EXAM: CT CHEST, ABDOMEN, AND PELVIS WITH CONTRAST TECHNIQUE: Multidetector CT imaging of the chest, abdomen and pelvis was performed following the standard protocol during bolus administration of intravenous contrast. CONTRAST:  100mL OMNIPAQUE IOHEXOL 300 MG/ML  SOLN COMPARISON:  None. FINDINGS: CT CHEST FINDINGS Cardiovascular: The heart is normal in size. No pericardial effusion. Mild tortuosity, ectasia and calcification of the thoracic aorta but no aneurysm or dissection. The branch vessels are patent. Scattered coronary artery calcifications are noted. Mediastinum/Nodes: No mediastinal or hilar mass or lymphadenopathy. No mediastinal hematoma. The esophagus is grossly normal. Lungs/Pleura: No acute pulmonary findings. No pulmonary contusions or pneumothorax. Dependent bibasilar subpleural atelectasis. No pleural effusion. No worrisome pulmonary lesions. A few small pulmonary nodules are likely benign. Mild bronchiectasis is noted. No interstitial lung disease. Musculoskeletal: No chest wall  mass, supraclavicular or axillary adenopathy. The thoracic vertebral bodies are normally aligned. No acute fracture. The sternum is intact. CT ABDOMEN PELVIS FINDINGS Hepatobiliary: No focal hepatic lesions or acute hepatic injury. No perihepatic fluid collections. The gallbladder is normal. No common bile duct dilatation. Pancreas: No mass, inflammation or ductal dilatation. No acute  injury or peripancreatic fluid collection. Spleen: Normal size. No acute injury or perisplenic fluid collection. Adrenals/Urinary Tract: The adrenal glands and kidneys are unremarkable. No acute renal injury or perinephric fluid collection. The bladder is unremarkable. Stomach/Bowel: The stomach, duodenum, small bowel and colon are grossly normal without oral contrast. No obvious acute inflammatory process, mass lesion or obstructive findings. No free air is identified. Vascular/Lymphatic: Moderate atherosclerotic calcifications involving the distal aorta and iliac arteries. No aneurysm or dissection. The major venous structures are patent. No mesenteric or retroperitoneal mass, adenopathy or hematoma. Reproductive: The prostate gland and seminal vesicles are unremarkable. Other: No free pelvic fluid collections, pelvic hematoma or inguinal mass. Musculoskeletal: There is a significant burst type fracture of L1 with retropulsion of the posterosuperior aspect of the vertebral body into the spinal canal. There is approximately 50-60% canal compromise. The pedicles are intact but there is a fracture extending through the left lamina and the left facet joint and left transverse process. I do not see any definite fractures above or below this level. Paraspinal hematoma is noted along with some blood extending up into the retrocrural space. The posterior ribs are intact. IMPRESSION: 1. Significant burst type fracture of L1 with retropulsion and spinal canal compromise. 2. There is also involvement of the posterior elements on the left side. 3. No acute intra-abdominal/intrapelvic injury is identified. The solid abdominal organs are intact and no definite findings for bowel injury. 4. Bibasilar atelectasis but no acute lung injury. 5. Normal appearance of the heart and great vessels. These results were called by telephone at the time of interpretation on 10/15/2019 at 4:57 pm to provider [facility] , who verbally  acknowledged these results. Electronically Signed   By: Rudie Meyer M.D.   On: 10/15/2019 16:58   Ct Cervical Spine Wo Contrast  Result Date: 10/15/2019 CLINICAL DATA:  Was working on a 20 foot ladder at home when it slipped out and he fell onto his back on pavement, estimated fall height 12 feet, denies loss of consciousness EXAM: CT HEAD WITHOUT CONTRAST CT CERVICAL SPINE WITHOUT CONTRAST TECHNIQUE: Multidetector CT imaging of the head and cervical spine was performed following the standard protocol without intravenous contrast. Multiplanar CT image reconstructions of the cervical spine were also generated. COMPARISON:  None FINDINGS: CT HEAD FINDINGS Brain: Normal ventricular morphology. No midline shift or mass effect. Normal appearance of brain parenchyma. No intracranial hemorrhage, mass lesion or evidence of acute infarction. No extra-axial fluid collections. Vascular: Minimal atherosclerotic calcification of internal carotid and vertebral arteries at skull base Skull: Intact Sinuses/Orbits: Clear Other: N/A CT CERVICAL SPINE FINDINGS Alignment: Normal Skull base and vertebrae: Mild osseous demineralization. Skull base intact. Prior anterior fusion C5-C7 with good bony fusion. Vertebral body heights maintained. Disc space narrowing greatest at C4-C5 with minimal retrolisthesis. Mild scattered facet degenerative changes greatest at C7-T1. No fracture, additional subluxation or bone destruction. Or Soft tissues and spinal canal: Prevertebral soft tissues normal thickness. Remaining visualized cervical soft tissues unremarkable. Disc levels:  No specific abnormalities Upper chest: Lung apices clear Other: N/A IMPRESSION: No acute intracranial abnormalities. Prior anterior C5-C7 cervical spine fusion. Mild scattered degenerative disc and facet disease changes cervical spine.  No acute cervical spine abnormalities. Electronically Signed   By: Ulyses Southward M.D.   On: 10/15/2019 16:54   Ct Abdomen Pelvis W  Contrast  Result Date: 10/15/2019 CLINICAL DATA:  Larey Seat off ladder today. EXAM: CT CHEST, ABDOMEN, AND PELVIS WITH CONTRAST TECHNIQUE: Multidetector CT imaging of the chest, abdomen and pelvis was performed following the standard protocol during bolus administration of intravenous contrast. CONTRAST:  OMNIPAQUE IOHEXOL 300 MG/ML  SOLN COMPARISON:  None. FINDINGS: CT CHEST FINDINGS Cardiovascular: The heart is normal in size. No pericardial effusion. Mild tortuosity, ectasia and calcification of the thoracic aorta but no aneurysm or dissection. The branch vessels are patent. Scattered coronary artery calcifications are noted. Mediastinum/Nodes: No mediastinal or hilar mass or lymphadenopathy. No mediastinal hematoma. The esophagus is grossly normal. Lungs/Pleura: No acute pulmonary findings. No pulmonary contusions or pneumothorax. Dependent bibasilar subpleural atelectasis. No pleural effusion. No worrisome pulmonary lesions. A few small pulmonary nodules are likely benign. Mild bronchiectasis is noted. No interstitial lung disease. Musculoskeletal: No chest wall mass, supraclavicular or axillary adenopathy. The thoracic vertebral bodies are normally aligned. No acute fracture. The sternum is intact. CT ABDOMEN PELVIS FINDINGS Hepatobiliary: No focal hepatic lesions or acute hepatic injury. No perihepatic fluid collections. The gallbladder is normal. No common bile duct dilatation. Pancreas: No mass, inflammation or ductal dilatation. No acute injury or peripancreatic fluid collection. Spleen: Normal size. No acute injury or perisplenic fluid collection. Adrenals/Urinary Tract: The adrenal glands and kidneys are unremarkable. No acute renal injury or perinephric fluid collection. The bladder is unremarkable. Stomach/Bowel: The stomach, duodenum, small bowel and colon are grossly normal without oral contrast. No obvious acute inflammatory process, mass lesion or obstructive findings. No free air is  identified. Vascular/Lymphatic: Moderate atherosclerotic calcifications involving the distal aorta and iliac arteries. No aneurysm or dissection. The major venous structures are patent. No mesenteric or retroperitoneal mass, adenopathy or hematoma. Reproductive: The prostate gland and seminal vesicles are unremarkable. Other: No free pelvic fluid collections, pelvic hematoma or inguinal mass. Musculoskeletal: There is a significant burst type fracture of L1 with retropulsion of the posterosuperior aspect of the vertebral body into the spinal canal. There is approximately 50-60% canal compromise. The pedicles are intact but there is a fracture extending through the left lamina and the left facet joint and left transverse process. I do not see any definite fractures above or below this level. Paraspinal hematoma is noted along with some blood extending up into the retrocrural space. The posterior ribs are intact. IMPRESSION: 1. Significant burst type fracture of L1 with retropulsion and spinal canal compromise. 2. There is also involvement of the posterior elements on the left side. 3. No acute intra-abdominal/intrapelvic injury is identified. The solid abdominal organs are intact and no definite findings for bowel injury. 4. Bibasilar atelectasis but no acute lung injury. 5. Normal appearance of the heart and great vessels. These results were called by telephone at the time of interpretation on 10/15/2019 at 4:57 pm to provider [facility] , who verbally acknowledged these results. Electronically Signed   By: Rudie Meyer M.D.   On: 10/15/2019 16:58   Dg Pelvis Portable  Result Date: 10/15/2019 CLINICAL DATA:  Fall off ladder from 12 ft height. Pelvic pain. Initial encounter. EXAM: PORTABLE PELVIS 1-2 VIEWS COMPARISON:  None. FINDINGS: There is no evidence of pelvic fracture or diastasis. No pelvic bone lesions are seen. IMPRESSION: Negative. Electronically Signed   By: Danae Orleans M.D.   On: 10/15/2019  16:18   Dg  Chest Port 1 View  Result Date: 10/15/2019 CLINICAL DATA:  Fall off ladder from 12 ft height. Chest pain. Left shoulder dislocation. Initial encounter. EXAM: PORTABLE CHEST 1 VIEW COMPARISON:  None. FINDINGS: The heart size and mediastinal contours are within normal limits. No evidence of pneumothorax or hemothorax. Both lungs are clear. Left shoulder dislocation noted. Cervical spine fusion hardware is seen. IMPRESSION: No active cardiopulmonary disease. Left shoulder dislocation. Electronically Signed   By: Danae Orleans M.D.   On: 10/15/2019 16:20   Dg Shoulder Left Portable  Result Date: 10/15/2019 CLINICAL DATA:  Post reduction images. EXAM: LEFT SHOULDER - 1 VIEW COMPARISON:  Pre reduction images, 10/15/2019 at 3:49 p.m. FINDINGS: Humeral head has been realigned with the glenoid. No fracture is seen. IMPRESSION: Successful reduction of the left shoulder dislocation. Electronically Signed   By: Amie Portland M.D.   On: 10/15/2019 18:06   Dg Shoulder Left Portable  Result Date: 10/15/2019 CLINICAL DATA:  Fall off ladder from 12 ft height. Left shoulder pain. Initial encounter. EXAM: LEFT SHOULDER - 1 VIEW COMPARISON:  None. FINDINGS: Anterior dislocation of the humeral head is seen. No fracture or other bone lesions identified. IMPRESSION: Anterior shoulder dislocation.  No evidence of fracture. Electronically Signed   By: Danae Orleans M.D.   On: 10/15/2019 16:19     Assessment/Plan: Estimated body mass index is 21.32 kg/m as calculated from the following:   Height as of this encounter: 5' 11.5" (1.816 m).   Weight as of this encounter: 70.3 kg.   Unfortunate 63 year old gentleman who fell 12 feet off a ladder and suffered injuries to his left shoulder and left ankle and an L1 burst fracture with a significant retropulsed fragment with severe stenosis at that level.  He is now sedated to have his shoulder and ankle reduced but the MDs in the emergency department assured me  that he had normal sensation and normal strength prior to sedation.  The L1 fracture is a 3 column injury with significant retropulsion.  1.  Recommend checking postvoid residual 2.  Recommend MRI of lumbar spine to evaluate where the conus is located and its relation to the fracture 3.  He will need open reduction internal fixation of the L1 fracture with pedicle screw instrumentation above and below the fracture.  We will try to do this tomorrow morning.  I have spoken at length with the wife regarding this.  I described the surgery as best I can.  She understands the risk of the surgery include but are not limited to bleeding, infection, CSF leak, spinal cord injury, nerve root injury, numbness, weakness, paralysis, loss of bowel bladder or sexual function, need for further surgery, pseudoarthrosis, nonhealing of the fracture, lack of relief of symptoms, worsening symptoms, misplaced hardware, failure of hardware, and anesthesia risk including DVT pneumonia MI and death.  She agrees to proceed.   Tia Alert 10/15/2019 6:08 PM

## 2019-10-15 NOTE — Progress Notes (Signed)
Orthopedic Tech Progress Note Patient Details:  Louis Jordan 05/17/1956 [id number]  Patient ID: Louis Jordan, male   DOB: [birth year], 63 y.o.   MRN: [id number]   Louis Jordan 10/15/2019, 7:05 PMCalled [employer] for TLSO brace.

## 2019-10-15 NOTE — ED Notes (Signed)
Patient transported to MRI 

## 2019-10-15 NOTE — H&P (Addendum)
History   Louis Jordan is an 63 y.o. male.   Chief Complaint:  Chief Complaint  Patient presents with  . Fall    HPI This is a 63 year old male in good health who fell about twelve feet from a ladder, landing on top of the ladder on pavement.  Complaining of pain left shoulder, left ankle, lower back.  Found to have a L1 burst fracture with retropulsion, dislocated left shoulder and left ankle fracture.  Neurosurgery plans surgery tomorrow.  Awaiting Ortho plans.  History reviewed. No pertinent past medical history.  History reviewed. No pertinent surgical history.  History reviewed. No pertinent family history. Social History:  has no history on file for tobacco, alcohol, and drug.  Allergies   Allergies  Allergen Reactions  . Penicillins     Home Medications   Prior to Admission medications   Not on File     Trauma Course   Results for orders placed or performed during the hospital encounter of 10/15/19 (from the past 48 hour(s))  Ethanol     Status: None   Collection Time: 10/15/19  3:35 PM  Result Value Ref Range   Alcohol, Ethyl (B) <10 <10 mg/dL    Comment: (NOTE) Lowest detectable limit for serum alcohol is 10 mg/dL. For medical purposes only. Performed at [facility], [address] [place], [place] [address]   Sample to Blood Bank     Status: None   Collection Time: 10/15/19  3:38 PM  Result Value Ref Range   Blood Bank Specimen SAMPLE AVAILABLE FOR TESTING    Sample Expiration      10/16/2019,2359 Performed at [facility], [address], [place], [place] [address]   SARS Coronavirus 2 by RT PCR (hospital order, performed in [facility] hospital lab) Nasopharyngeal Nasopharyngeal Swab     Status: None   Collection Time: 10/15/19  3:38 PM   Specimen: Nasopharyngeal Swab  Result Value Ref Range   SARS Coronavirus 2 NEGATIVE NEGATIVE    Comment: (NOTE) If result is NEGATIVE SARS-CoV-2 target nucleic acids are NOT DETECTED. The SARS-CoV-2  RNA is generally detectable in upper and lower  respiratory specimens during the acute phase of infection. The lowest  concentration of SARS-CoV-2 viral copies this assay can detect is 250  copies / mL. A negative result does not preclude SARS-CoV-2 infection  and should not be used as the sole basis for treatment or other  patient management decisions.  A negative result may occur with  improper specimen collection / handling, submission of specimen other  than nasopharyngeal swab, presence of viral mutation(s) within the  areas targeted by this assay, and inadequate number of viral copies  (<250 copies / mL). A negative result must be combined with clinical  observations, patient history, and epidemiological information. If result is POSITIVE SARS-CoV-2 target nucleic acids are DETECTED. The SARS-CoV-2 RNA is generally detectable in upper and lower  respiratory specimens dur ing the acute phase of infection.  Positive  results are indicative of active infection with SARS-CoV-2.  Clinical  correlation with patient history and other diagnostic information is  necessary to determine patient infection status.  Positive results do  not rule out bacterial infection or co-infection with other viruses. If result is PRESUMPTIVE POSTIVE SARS-CoV-2 nucleic acids MAY BE PRESENT.   A presumptive positive result was obtained on the submitted specimen  and confirmed on repeat testing.  While 2019 novel coronavirus  (SARS-CoV-2) nucleic acids may be  present in the submitted sample  additional confirmatory testing may be necessary for epidemiological  and / or clinical management purposes  to differentiate between  SARS-CoV-2 and other Sarbecovirus currently known to infect humans.  If clinically indicated additional testing with an alternate test  methodology [phone]) is advised. The SARS-CoV-2 RNA is generally  detectable in upper and lower respiratory sp ecimens during the acute  phase of  infection. The expected result is Negative. Fact Sheet for Patients:  BoilerBrush.com.cy Fact Sheet for Healthcare Providers: https://pope.com/ This test is not yet approved or cleared by the [place] FDA and has been authorized for detection and/or diagnosis of SARS-CoV-2 by FDA under an Emergency Use Authorization (EUA).  This EUA will remain in effect (meaning this test can be used) for the duration of the COVID-19 declaration under Section 564(b)(1) of the Act, 21 U.S.C. section 360bbb-3(b)(1), unless the authorization is terminated or revoked sooner. Performed at [facility], [address] [place], [place] [address]   CDS serology     Status: None   Collection Time: 10/15/19  3:56 PM  Result Value Ref Range   CDS serology specimen      SPECIMEN WILL BE HELD FOR 14 DAYS IF TESTING IS REQUIRED    Comment: Performed at [facility], [address] [place], [place] [address]  Comprehensive metabolic panel     Status: Abnormal   Collection Time: 10/15/19  3:56 PM  Result Value Ref Range   Sodium 141 135 - 145 mmol/L   Potassium 3.4 (L) 3.5 - 5.1 mmol/L   Chloride 106 98 - 111 mmol/L   CO2 26 22 - 32 mmol/L   Glucose, Bld 120 (H) 70 - 99 mg/dL   BUN 23 8 - 23 mg/dL   Creatinine, Ser 1.95 0.61 - 1.24 mg/dL   Calcium 9.2 8.9 - 09.3 mg/dL   Total Protein 6.1 (L) 6.5 - 8.1 g/dL   Albumin 3.9 3.5 - 5.0 g/dL   AST 36 15 - 41 U/L   ALT 28 0 - 44 U/L   Alkaline Phosphatase 53 38 - 126 U/L   Total Bilirubin 0.6 0.3 - 1.2 mg/dL   GFR calc non Af Amer >60 >60 mL/min   GFR calc Af Amer >60 >60 mL/min   Anion gap 9 5 - 15    Comment: Performed at [facility], [address], [place], [place] [address]  CBC     Status: Abnormal   Collection Time: 10/15/19  3:56 PM  Result Value Ref Range   WBC 16.0 (H) 4.0 - 10.5 K/uL   RBC 4.28 4.22 - 5.81 MIL/uL   Hemoglobin 13.5 13.0 - 17.0 g/dL   HCT 45.8 09.9 - 83.3 %    MCV 92.8 80.0 - 100.0 fL   MCH 31.5 26.0 - 34.0 pg   MCHC 34.0 30.0 - 36.0 g/dL   RDW 82.5 05.3 - 97.6 %   Platelets 264 150 - 400 K/uL   nRBC 0.0 0.0 - 0.2 %    Comment: Performed at [facility], [address], [place], [place] [address]  Protime-INR     Status: None   Collection Time: 10/15/19  3:56 PM  Result Value Ref Range   Prothrombin Time 14.1 11.4 - 15.2 seconds   INR 1.1 0.8 - 1.2    Comment: (NOTE) INR goal varies based on device and disease states. Performed at [facility], [address]  [place], [place] [address]   I-stat chem 8, ED     Status: Abnormal   Collection Time: 10/15/19  4:06 PM  Result Value Ref Range   Sodium 140 135 - 145 mmol/L   Potassium 3.5 3.5 - 5.1 mmol/L   Chloride 105 98 - 111 mmol/L   BUN 22 8 - 23 mg/dL   Creatinine, Ser 0.90 0.61 - 1.24 mg/dL   Glucose, Bld 117 (H) 70 - 99 mg/dL   Calcium, Ion 1.12 (L) 1.15 - 1.40 mmol/L   TCO2 26 22 - 32 mmol/L   Hemoglobin 12.9 (L) 13.0 - 17.0 g/dL   HCT 38.0 (L) 39.0 - 52.0 %  Lactic acid, plasma     Status: None   Collection Time: 10/15/19  4:14 PM  Result Value Ref Range   Lactic Acid, Venous 0.9 0.5 - 1.9 mmol/L    Comment: Performed at [facility] [address] [place], [place] [address]   Dg Tibia/fibula Left  Result Date: 10/15/2019 CLINICAL DATA:  Pt arrives via EMS from home with reports of working on a 20 ft ladder when it slipped out and he fell on his back on the pavement. Estimated falling 12 ft. Denies LOC. Deformity to left ankle and dislocation to left shoulder EXAM: LEFT TIBIA AND FIBULA - 2 VIEW COMPARISON:  None. FINDINGS: Ankle fractures. There is a transverse fracture across the distal fibula, at the metadiaphysis. There is an oblique fracture across the medial aspect of the distal tibia extending to the medial tibial plafond, with flexure displaced proximally 5 mm medially and superiorly. There is widening of the lateral margin of the ankle joint. No talar  subluxation. No additional fractures.  Knee joint normally aligned. 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 IMPRESSION: 1. Ankle fractures with oblique fracture across the medial distal tibia, above the base of the medial malleolus, and a lateral fracture of the distal fibula. 2. No other fractures.  No dislocation. 3. 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 Electronically Signed   By: Lajean Manes M.D.   On: 10/15/2019 16:19   Dg Ankle Complete Left  Result Date: 10/15/2019 CLINICAL DATA:  Status post reduction EXAM: LEFT ANKLE COMPLETE - 3+ VIEW COMPARISON:  October 15, 2019 FINDINGS: The patient has been placed in a cast. The patient's trimalleolar fracture demonstrates similar alignment in the interval. IMPRESSION: The patient has been placed in a cast. No significant change in alignment. Electronically Signed   By: Dorise Bullion III M.D   On: 10/15/2019 18:13   Ct Head Wo Contrast  Result Date: 10/15/2019 CLINICAL DATA:  Was working on a 20 foot ladder at home when it slipped out and he fell onto his back on pavement, estimated fall height 12 feet, denies loss of consciousness EXAM: CT HEAD WITHOUT CONTRAST CT CERVICAL SPINE WITHOUT CONTRAST TECHNIQUE: Multidetector CT imaging of the head and cervical spine was performed following the standard protocol without intravenous contrast. Multiplanar CT image reconstructions of the cervical spine were also generated. COMPARISON:  None FINDINGS: CT HEAD FINDINGS Brain: Normal ventricular morphology. No midline shift or mass effect. Normal appearance of brain parenchyma. No intracranial hemorrhage, mass lesion or evidence of acute infarction. No extra-axial fluid collections. Vascular: Minimal atherosclerotic calcification of internal carotid and vertebral arteries at skull base Skull: Intact Sinuses/Orbits: Clear Other: N/A CT CERVICAL SPINE FINDINGS Alignment: Normal Skull base and vertebrae: Mild osseous demineralization.  Skull base intact. Prior anterior fusion C5-C7 with good bony fusion.  Vertebral body heights maintained. Disc space narrowing greatest at C4-C5 with minimal retrolisthesis. Mild scattered facet degenerative changes greatest at C7-T1. No fracture, additional subluxation or bone destruction. Or Soft tissues and spinal canal: Prevertebral soft tissues normal thickness. Remaining visualized cervical soft tissues unremarkable. Disc levels:  No specific abnormalities Upper chest: Lung apices clear Other: N/A IMPRESSION: No acute intracranial abnormalities. Prior anterior C5-C7 cervical spine fusion. Mild scattered degenerative disc and facet disease changes cervical spine. No acute cervical spine abnormalities. Electronically Signed   By: Ulyses Southward M.D.   On: 10/15/2019 16:54   Ct Chest W Contrast  Result Date: 10/15/2019 CLINICAL DATA:  Larey Seat off ladder today. EXAM: CT CHEST, ABDOMEN, AND PELVIS WITH CONTRAST TECHNIQUE: Multidetector CT imaging of the chest, abdomen and pelvis was performed following the standard protocol during bolus administration of intravenous contrast. CONTRAST:  OMNIPAQUE IOHEXOL 300 MG/ML  SOLN COMPARISON:  None. FINDINGS: CT CHEST FINDINGS Cardiovascular: The heart is normal in size. No pericardial effusion. Mild tortuosity, ectasia and calcification of the thoracic aorta but no aneurysm or dissection. The branch vessels are patent. Scattered coronary artery calcifications are noted. Mediastinum/Nodes: No mediastinal or hilar mass or lymphadenopathy. No mediastinal hematoma. The esophagus is grossly normal. Lungs/Pleura: No acute pulmonary findings. No pulmonary contusions or pneumothorax. Dependent bibasilar subpleural atelectasis. No pleural effusion. No worrisome pulmonary lesions. A few small pulmonary nodules are likely benign. Mild bronchiectasis is noted. No interstitial lung disease. Musculoskeletal: No chest wall mass, supraclavicular or axillary adenopathy. The thoracic  vertebral bodies are normally aligned. No acute fracture. The sternum is intact. CT ABDOMEN PELVIS FINDINGS Hepatobiliary: No focal hepatic lesions or acute hepatic injury. No perihepatic fluid collections. The gallbladder is normal. No common bile duct dilatation. Pancreas: No mass, inflammation or ductal dilatation. No acute injury or peripancreatic fluid collection. Spleen: Normal size. No acute injury or perisplenic fluid collection. Adrenals/Urinary Tract: The adrenal glands and kidneys are unremarkable. No acute renal injury or perinephric fluid collection. The bladder is unremarkable. Stomach/Bowel: The stomach, duodenum, small bowel and colon are grossly normal without oral contrast. No obvious acute inflammatory process, mass lesion or obstructive findings. No free air is identified. Vascular/Lymphatic: Moderate atherosclerotic calcifications involving the distal aorta and iliac arteries. No aneurysm or dissection. The major venous structures are patent. No mesenteric or retroperitoneal mass, adenopathy or hematoma. Reproductive: The prostate gland and seminal vesicles are unremarkable. Other: No free pelvic fluid collections, pelvic hematoma or inguinal mass. Musculoskeletal: There is a significant burst type fracture of L1 with retropulsion of the posterosuperior aspect of the vertebral body into the spinal canal. There is approximately 50-60% canal compromise. The pedicles are intact but there is a fracture extending through the left lamina and the left facet joint and left transverse process. I do not see any definite fractures above or below this level. Paraspinal hematoma is noted along with some blood extending up into the retrocrural space. The posterior ribs are intact. IMPRESSION: 1. Significant burst type fracture of L1 with retropulsion and spinal canal compromise. 2. There is also involvement of the posterior elements on the left side. 3. No acute intra-abdominal/intrapelvic injury is  identified. The solid abdominal organs are intact and no definite findings for bowel injury. 4. Bibasilar atelectasis but no acute lung injury. 5. Normal appearance of the heart and great vessels. These results were called by telephone at the time of interpretation on 10/15/2019 at 4:57 pm to provider [facility] , who verbally acknowledged these results. Electronically Signed  By: Rudie Meyer M.D.   On: 10/15/2019 16:58   Ct Cervical Spine Wo Contrast  Result Date: 10/15/2019 CLINICAL DATA:  Was working on a 20 foot ladder at home when it slipped out and he fell onto his back on pavement, estimated fall height 12 feet, denies loss of consciousness EXAM: CT HEAD WITHOUT CONTRAST CT CERVICAL SPINE WITHOUT CONTRAST TECHNIQUE: Multidetector CT imaging of the head and cervical spine was performed following the standard protocol without intravenous contrast. Multiplanar CT image reconstructions of the cervical spine were also generated. COMPARISON:  None FINDINGS: CT HEAD FINDINGS Brain: Normal ventricular morphology. No midline shift or mass effect. Normal appearance of brain parenchyma. No intracranial hemorrhage, mass lesion or evidence of acute infarction. No extra-axial fluid collections. Vascular: Minimal atherosclerotic calcification of internal carotid and vertebral arteries at skull base Skull: Intact Sinuses/Orbits: Clear Other: N/A CT CERVICAL SPINE FINDINGS Alignment: Normal Skull base and vertebrae: Mild osseous demineralization. Skull base intact. Prior anterior fusion C5-C7 with good bony fusion. Vertebral body heights maintained. Disc space narrowing greatest at C4-C5 with minimal retrolisthesis. Mild scattered facet degenerative changes greatest at C7-T1. No fracture, additional subluxation or bone destruction. Or Soft tissues and spinal canal: Prevertebral soft tissues normal thickness. Remaining visualized cervical soft tissues unremarkable. Disc levels:  No specific abnormalities Upper  chest: Lung apices clear Other: N/A IMPRESSION: No acute intracranial abnormalities. Prior anterior C5-C7 cervical spine fusion. Mild scattered degenerative disc and facet disease changes cervical spine. No acute cervical spine abnormalities. Electronically Signed   By: Ulyses Southward M.D.   On: 10/15/2019 16:54   Ct Abdomen Pelvis W Contrast  Result Date: 10/15/2019 CLINICAL DATA:  Larey Seat off ladder today. EXAM: CT CHEST, ABDOMEN, AND PELVIS WITH CONTRAST TECHNIQUE: Multidetector CT imaging of the chest, abdomen and pelvis was performed following the standard protocol during bolus administration of intravenous contrast. CONTRAST:  OMNIPAQUE IOHEXOL 300 MG/ML  SOLN COMPARISON:  None. FINDINGS: CT CHEST FINDINGS Cardiovascular: The heart is normal in size. No pericardial effusion. Mild tortuosity, ectasia and calcification of the thoracic aorta but no aneurysm or dissection. The branch vessels are patent. Scattered coronary artery calcifications are noted. Mediastinum/Nodes: No mediastinal or hilar mass or lymphadenopathy. No mediastinal hematoma. The esophagus is grossly normal. Lungs/Pleura: No acute pulmonary findings. No pulmonary contusions or pneumothorax. Dependent bibasilar subpleural atelectasis. No pleural effusion. No worrisome pulmonary lesions. A few small pulmonary nodules are likely benign. Mild bronchiectasis is noted. No interstitial lung disease. Musculoskeletal: No chest wall mass, supraclavicular or axillary adenopathy. The thoracic vertebral bodies are normally aligned. No acute fracture. The sternum is intact. CT ABDOMEN PELVIS FINDINGS Hepatobiliary: No focal hepatic lesions or acute hepatic injury. No perihepatic fluid collections. The gallbladder is normal. No common bile duct dilatation. Pancreas: No mass, inflammation or ductal dilatation. No acute injury or peripancreatic fluid collection. Spleen: Normal size. No acute injury or perisplenic fluid collection. Adrenals/Urinary Tract:  The adrenal glands and kidneys are unremarkable. No acute renal injury or perinephric fluid collection. The bladder is unremarkable. Stomach/Bowel: The stomach, duodenum, small bowel and colon are grossly normal without oral contrast. No obvious acute inflammatory process, mass lesion or obstructive findings. No free air is identified. Vascular/Lymphatic: Moderate atherosclerotic calcifications involving the distal aorta and iliac arteries. No aneurysm or dissection. The major venous structures are patent. No mesenteric or retroperitoneal mass, adenopathy or hematoma. Reproductive: The prostate gland and seminal vesicles are unremarkable. Other: No free pelvic fluid collections, pelvic hematoma or inguinal mass. Musculoskeletal: There is  a significant burst type fracture of L1 with retropulsion of the posterosuperior aspect of the vertebral body into the spinal canal. There is approximately 50-60% canal compromise. The pedicles are intact but there is a fracture extending through the left lamina and the left facet joint and left transverse process. I do not see any definite fractures above or below this level. Paraspinal hematoma is noted along with some blood extending up into the retrocrural space. The posterior ribs are intact. IMPRESSION: 1. Significant burst type fracture of L1 with retropulsion and spinal canal compromise. 2. There is also involvement of the posterior elements on the left side. 3. No acute intra-abdominal/intrapelvic injury is identified. The solid abdominal organs are intact and no definite findings for bowel injury. 4. Bibasilar atelectasis but no acute lung injury. 5. Normal appearance of the heart and great vessels. These results were called by telephone at the time of interpretation on 10/15/2019 at 4:57 pm to provider [facility] , who verbally acknowledged these results. Electronically Signed   By: Rudie Meyer M.D.   On: 10/15/2019 16:58   Dg Pelvis Portable  Result Date:  10/15/2019 CLINICAL DATA:  Fall off ladder from 12 ft height. Pelvic pain. Initial encounter. EXAM: PORTABLE PELVIS 1-2 VIEWS COMPARISON:  None. FINDINGS: There is no evidence of pelvic fracture or diastasis. No pelvic bone lesions are seen. IMPRESSION: Negative. Electronically Signed   By: Danae Orleans M.D.   On: 10/15/2019 16:18   Dg Chest Port 1 View  Result Date: 10/15/2019 CLINICAL DATA:  Fall off ladder from 12 ft height. Chest pain. Left shoulder dislocation. Initial encounter. EXAM: PORTABLE CHEST 1 VIEW COMPARISON:  None. FINDINGS: The heart size and mediastinal contours are within normal limits. No evidence of pneumothorax or hemothorax. Both lungs are clear. Left shoulder dislocation noted. Cervical spine fusion hardware is seen. IMPRESSION: No active cardiopulmonary disease. Left shoulder dislocation. Electronically Signed   By: Danae Orleans M.D.   On: 10/15/2019 16:20   Dg Shoulder Left Portable  Result Date: 10/15/2019 CLINICAL DATA:  Post reduction images. EXAM: LEFT SHOULDER - 1 VIEW COMPARISON:  Pre reduction images, 10/15/2019 at 3:49 p.m. FINDINGS: Humeral head has been realigned with the glenoid. No fracture is seen. IMPRESSION: Successful reduction of the left shoulder dislocation. Electronically Signed   By: Amie Portland M.D.   On: 10/15/2019 18:06   Dg Shoulder Left Portable  Result Date: 10/15/2019 CLINICAL DATA:  Fall off ladder from 12 ft height. Left shoulder pain. Initial encounter. EXAM: LEFT SHOULDER - 1 VIEW COMPARISON:  None. FINDINGS: Anterior dislocation of the humeral head is seen. No fracture or other bone lesions identified. IMPRESSION: Anterior shoulder dislocation.  No evidence of fracture. Electronically Signed   By: Danae Orleans M.D.   On: 10/15/2019 16:19    Review of Systems  Constitutional: Negative for weight loss.  HENT: Negative for ear discharge, ear pain, hearing loss and tinnitus.   Eyes: Negative for blurred vision, double vision, photophobia  and pain.  Respiratory: Negative for cough, sputum production and shortness of breath.   Cardiovascular: Negative for chest pain.  Gastrointestinal: Negative for abdominal pain, nausea and vomiting.  Genitourinary: Negative for dysuria, flank pain, frequency and urgency.  Musculoskeletal: Positive for back pain and joint pain. Negative for falls, myalgias and neck pain.  Neurological: Negative for dizziness, tingling, sensory change, focal weakness, loss of consciousness and headaches.  Endo/Heme/Allergies: Does not bruise/bleed easily.  Psychiatric/Behavioral: Negative for depression, memory loss and substance abuse. The patient  is not nervous/anxious.     Blood pressure (!) 151/76, pulse 72, temperature (!) 97.1 F (36.2 C), temperature source Temporal, resp. rate 18, height 5' 11.5" (1.816 m), weight 70.3 kg, SpO2 100 %. Physical Exam  Vitals reviewed. Constitutional: He is oriented to person, place, and time. He appears well-developed and well-nourished. He is cooperative. No distress.  HENT:  Head: Normocephalic and atraumatic. Head is without raccoon's eyes, without Battle's sign, without abrasion, without contusion and without laceration.  Right Ear: Hearing, tympanic membrane, external ear and ear canal normal. No lacerations. No drainage or tenderness. No foreign bodies. Tympanic membrane is not perforated. No hemotympanum.  Left Ear: Hearing, tympanic membrane, external ear and ear canal normal. No lacerations. No drainage or tenderness. No foreign bodies. Tympanic membrane is not perforated. No hemotympanum.  Nose: Nose normal. No nose lacerations, sinus tenderness, nasal deformity or nasal septal hematoma. No epistaxis.  Mouth/Throat: Uvula is midline, oropharynx is clear and moist and mucous membranes are normal. No lacerations.  Eyes: Pupils are equal, round, and reactive to light. Conjunctivae, EOM and lids are normal. No scleral icterus.  Neck: Trachea normal. No JVD present.  No spinous process tenderness and no muscular tenderness present. Carotid bruit is not present. No thyromegaly present.  Cardiovascular: Normal rate, regular rhythm, normal heart sounds, intact distal pulses and normal pulses.  Respiratory: Effort normal and breath sounds normal. No respiratory distress. He exhibits no tenderness, no bony tenderness, no laceration and no crepitus.  GI: Soft. Normal appearance and bowel sounds are normal. He exhibits no distension. There is no abdominal tenderness. There is no rigidity, no rebound, no guarding and no CVA tenderness.  Musculoskeletal: Normal range of motion.        General: No tenderness or edema.     Comments: Left shoulder tenderness Left ankle tenderness NVI distally  Lymphadenopathy:    He has no cervical adenopathy.  Neurological: He is alert and oriented to person, place, and time. He has normal strength. No cranial nerve deficit or sensory deficit. GCS eye subscore is 4. GCS verbal subscore is 5. GCS motor subscore is 6.  Skin: Skin is warm, dry and intact. He is not diaphoretic.  Psychiatric: He has a normal mood and affect. His speech is normal and behavior is normal.     Assessment/Plan Fall from ladder L1 burst fracture with retropulsion and spinal stenosis Left anterior shoulder dislocation - reduced Left ankle trimalleolar fracture  Admit to Trauma Strict logroll only OR tomorrow with Neurosurgery for stabilization Awaiting Ortho plans for shoulder and trimalleolar ankle fracture PRN pain meds   Wynona LunaMatthew K Dawnell Bryant 10/15/2019, 6:37 PM   Procedures

## 2019-10-15 NOTE — ED Triage Notes (Signed)
Pt arrives via EMS from home with reports of working on a 20 ft ladder when it slipped out and he fell on his back on the pavement. Estimated falling 12 ft. Denies LOC. Deformity to left ankle and dislocation to left shoulder. 250 mcg fentanyl and 4 mg zofran given by EMS.

## 2019-10-15 NOTE — Progress Notes (Signed)
**Note Louis-Identified via Obfuscation** Responded to Level 1 Trauma.  Met Louis Jordan family in the ED lobby and escorted them to [facility] Room A.  Louis Jordan was out for CAT scan.  His nurse, a member of his church, will escort family back for a visit when he returns to ED.  Louis Jordan Chaplain Resident

## 2019-10-15 NOTE — Progress Notes (Signed)
Orthopedic Tech Progress Note Patient Details:  Louis Jordan [birth year] [id number]  Ortho Devices Type of Ortho Device: Ace wrap, Shoulder immobilizer, Stirrup splint, Short leg splint Ortho Device/Splint Location: reducton of left shoulder and left foot and ankle Ortho Device/Splint Interventions: Application   Post Interventions Instructions Provided: Care of device   Louis Jordan 10/15/2019, 5:52 PM

## 2019-10-15 NOTE — Progress Notes (Signed)
Orthopedic Tech Progress Note Patient Details:  Louis Jordan 1956/03/19 [id number]  Patient ID: Verl Bangs, male   DOB: 05/27/1956, 63 y.o.   MRN: [id number] Bio tech applied brace.  Karolee Stamps 10/15/2019, 8:55 PM

## 2019-10-16 ENCOUNTER — Inpatient Hospital Stay (HOSPITAL_COMMUNITY): Payer: Commercial Managed Care - PPO | Admitting: Anesthesiology

## 2019-10-16 ENCOUNTER — Inpatient Hospital Stay (HOSPITAL_COMMUNITY): Payer: Commercial Managed Care - PPO

## 2019-10-16 ENCOUNTER — Inpatient Hospital Stay (HOSPITAL_COMMUNITY): Admission: EM | Disposition: A | Discharge status: Rehabilitation Facility | Payer: Self-pay | Source: Home / Self Care

## 2019-10-16 DIAGNOSIS — Z981 Arthrodesis status: Secondary | ICD-10-CM

## 2019-10-16 HISTORY — PX: LUMBAR SPINE SURGERY: SHX701

## 2019-10-16 LAB — MRSA PCR SCREENING: MRSA by PCR: NEGATIVE

## 2019-10-16 LAB — HIV ANTIBODY (ROUTINE TESTING W REFLEX): HIV Screen 4th Generation wRfx: NONREACTIVE

## 2019-10-16 SURGERY — POSTERIOR LUMBAR FUSION 1 LEVEL
Anesthesia: General | Site: Back

## 2019-10-16 MED ORDER — PROMETHAZINE HCL 25 MG/ML IJ SOLN
25.0000 mg | INTRAMUSCULAR | Status: DC | PRN
  Filled 2019-10-16: qty 1

## 2019-10-16 MED ORDER — CELECOXIB 200 MG PO CAPS
200.0000 mg | ORAL_CAPSULE | Freq: Two times a day (BID) | ORAL | Status: DC
  Administered 2019-10-16 – 2019-10-25 (×18): 200 mg via ORAL
  Filled 2019-10-16 (×19): qty 1

## 2019-10-16 MED ORDER — KETOROLAC TROMETHAMINE 30 MG/ML IJ SOLN
INTRAMUSCULAR | Status: AC
  Filled 2019-10-16: qty 1

## 2019-10-16 MED ORDER — THROMBIN 5000 UNITS EX SOLR
OROMUCOSAL | Status: DC | PRN
  Administered 2019-10-16 (×2): 5 mL via TOPICAL

## 2019-10-16 MED ORDER — LIDOCAINE 2% (20 MG/ML) 5 ML SYRINGE
INTRAMUSCULAR | Status: DC | PRN
  Administered 2019-10-16: 100 mg via INTRAVENOUS

## 2019-10-16 MED ORDER — MIDAZOLAM HCL 2 MG/2ML IJ SOLN
INTRAMUSCULAR | Status: AC
  Filled 2019-10-16: qty 2

## 2019-10-16 MED ORDER — EPHEDRINE 5 MG/ML INJ
INTRAVENOUS | Status: AC
  Filled 2019-10-16: qty 10

## 2019-10-16 MED ORDER — SODIUM CHLORIDE 0.9% FLUSH: 3.0000 mL | INTRAVENOUS | Status: DC | PRN

## 2019-10-16 MED ORDER — SODIUM CHLORIDE 0.9% FLUSH
3.0000 mL | Freq: Two times a day (BID) | INTRAVENOUS | Status: DC
  Administered 2019-10-16: 3 mL via INTRAVENOUS

## 2019-10-16 MED ORDER — ONDANSETRON HCL 4 MG/2ML IJ SOLN
INTRAMUSCULAR | Status: AC
  Filled 2019-10-16: qty 2

## 2019-10-16 MED ORDER — CHLORHEXIDINE GLUCONATE CLOTH 2 % EX PADS: 6.0000 | MEDICATED_PAD | Freq: Every day | CUTANEOUS | Status: DC

## 2019-10-16 MED ORDER — FENTANYL CITRATE (PF) 250 MCG/5ML IJ SOLN
INTRAMUSCULAR | Status: AC
  Filled 2019-10-16: qty 5

## 2019-10-16 MED ORDER — ACETAMINOPHEN 10 MG/ML IV SOLN
INTRAVENOUS | Status: AC
  Filled 2019-10-16: qty 100

## 2019-10-16 MED ORDER — ARTHREX ANGEL - ACD-A SOLUTION (CHARTING ONLY) OPTIME
TOPICAL | Status: DC | PRN
  Administered 2019-10-16 (×2): 10 mL via TOPICAL

## 2019-10-16 MED ORDER — SUGAMMADEX SODIUM 200 MG/2ML IV SOLN
INTRAVENOUS | Status: DC | PRN
  Administered 2019-10-16: 200 mg via INTRAVENOUS

## 2019-10-16 MED ORDER — VANCOMYCIN HCL 1000 MG IV SOLR
INTRAVENOUS | Status: DC | PRN
  Administered 2019-10-16: 1000 mg

## 2019-10-16 MED ORDER — ACETAMINOPHEN 650 MG RE SUPP: 650.0000 mg | RECTAL | Status: DC | PRN

## 2019-10-16 MED ORDER — ALBUMIN HUMAN 5 % IV SOLN
INTRAVENOUS | Status: DC | PRN
  Administered 2019-10-16 (×2): via INTRAVENOUS

## 2019-10-16 MED ORDER — THROMBIN 5000 UNITS EX SOLR
CUTANEOUS | Status: AC
  Filled 2019-10-16: qty 10000

## 2019-10-16 MED ORDER — ONDANSETRON HCL 4 MG/2ML IJ SOLN
INTRAMUSCULAR | Status: DC | PRN
  Administered 2019-10-16 (×2): 4 mg via INTRAVENOUS

## 2019-10-16 MED ORDER — METHOCARBAMOL 500 MG PO TABS
500.0000 mg | ORAL_TABLET | Freq: Four times a day (QID) | ORAL | Status: DC | PRN
  Administered 2019-10-17 (×2): 500 mg via ORAL
  Filled 2019-10-16 (×2): qty 1

## 2019-10-16 MED ORDER — EPHEDRINE SULFATE-NACL 50-0.9 MG/10ML-% IV SOSY
PREFILLED_SYRINGE | INTRAVENOUS | Status: DC | PRN
  Administered 2019-10-16 (×3): 5 mg via INTRAVENOUS

## 2019-10-16 MED ORDER — ARTIFICIAL TEARS OPHTHALMIC OINT
TOPICAL_OINTMENT | OPHTHALMIC | Status: DC | PRN
  Administered 2019-10-16: 1 via OPHTHALMIC

## 2019-10-16 MED ORDER — PHENYLEPHRINE 40 MCG/ML (10ML) SYRINGE FOR IV PUSH (FOR BLOOD PRESSURE SUPPORT)
PREFILLED_SYRINGE | INTRAVENOUS | Status: AC
  Filled 2019-10-16: qty 10

## 2019-10-16 MED ORDER — PHENYLEPHRINE HCL-NACL 10-0.9 MG/250ML-% IV SOLN
INTRAVENOUS | Status: AC
  Filled 2019-10-16: qty 250

## 2019-10-16 MED ORDER — LIDOCAINE 2% (20 MG/ML) 5 ML SYRINGE
INTRAMUSCULAR | Status: AC
  Filled 2019-10-16: qty 5

## 2019-10-16 MED ORDER — MEPERIDINE HCL 25 MG/ML IJ SOLN: 6.2500 mg | INTRAMUSCULAR | Status: DC | PRN

## 2019-10-16 MED ORDER — HYDROMORPHONE HCL 1 MG/ML IJ SOLN: 0.2500 mg | INTRAMUSCULAR | Status: DC | PRN

## 2019-10-16 MED ORDER — VANCOMYCIN HCL IN DEXTROSE 1-5 GM/200ML-% IV SOLN
1000.0000 mg | Freq: Once | INTRAVENOUS | Status: DC
  Filled 2019-10-16: qty 200

## 2019-10-16 MED ORDER — ONDANSETRON HCL 4 MG/2ML IJ SOLN: 4.0000 mg | Freq: Once | INTRAMUSCULAR | Status: DC | PRN

## 2019-10-16 MED ORDER — BUPIVACAINE HCL (PF) 0.25 % IJ SOLN
INTRAMUSCULAR | Status: AC
  Filled 2019-10-16: qty 30

## 2019-10-16 MED ORDER — DEXAMETHASONE SODIUM PHOSPHATE 10 MG/ML IJ SOLN
INTRAMUSCULAR | Status: DC | PRN
  Administered 2019-10-16: 5 mg via INTRAVENOUS

## 2019-10-16 MED ORDER — THROMBIN 20000 UNITS EX SOLR
CUTANEOUS | Status: AC
  Filled 2019-10-16: qty 20000

## 2019-10-16 MED ORDER — PHENYLEPHRINE 40 MCG/ML (10ML) SYRINGE FOR IV PUSH (FOR BLOOD PRESSURE SUPPORT)
PREFILLED_SYRINGE | INTRAVENOUS | Status: DC | PRN
  Administered 2019-10-16: 80 ug via INTRAVENOUS
  Administered 2019-10-16: 120 ug via INTRAVENOUS
  Administered 2019-10-16: 80 ug via INTRAVENOUS

## 2019-10-16 MED ORDER — DEXAMETHASONE SODIUM PHOSPHATE 10 MG/ML IJ SOLN
INTRAMUSCULAR | Status: AC
  Filled 2019-10-16: qty 1

## 2019-10-16 MED ORDER — VANCOMYCIN HCL 1000 MG IV SOLR
INTRAVENOUS | Status: AC
  Filled 2019-10-16: qty 1000

## 2019-10-16 MED ORDER — KETOROLAC TROMETHAMINE 30 MG/ML IJ SOLN
INTRAMUSCULAR | Status: DC | PRN
  Administered 2019-10-16: 30 mg via INTRAVENOUS

## 2019-10-16 MED ORDER — HYDROMORPHONE HCL 1 MG/ML IJ SOLN
0.5000 mg | INTRAMUSCULAR | Status: DC | PRN
  Administered 2019-10-16 – 2019-10-17 (×5): 0.5 mg via INTRAVENOUS
  Filled 2019-10-16 (×5): qty 1

## 2019-10-16 MED ORDER — 0.9 % SODIUM CHLORIDE (POUR BTL) OPTIME
TOPICAL | Status: DC | PRN
  Administered 2019-10-16: 1000 mL

## 2019-10-16 MED ORDER — ROCURONIUM BROMIDE 10 MG/ML (PF) SYRINGE
PREFILLED_SYRINGE | INTRAVENOUS | Status: AC
  Filled 2019-10-16: qty 10

## 2019-10-16 MED ORDER — DEXAMETHASONE 4 MG PO TABS
4.0000 mg | ORAL_TABLET | Freq: Four times a day (QID) | ORAL | Status: DC
  Administered 2019-10-20 – 2019-10-25 (×20): 4 mg via ORAL
  Filled 2019-10-16 (×25): qty 1

## 2019-10-16 MED ORDER — ACETAMINOPHEN 10 MG/ML IV SOLN
INTRAVENOUS | Status: DC | PRN
  Administered 2019-10-16: 1000 mg via INTRAVENOUS

## 2019-10-16 MED ORDER — SODIUM CHLORIDE 0.9 % IV SOLN
INTRAVENOUS | Status: DC | PRN
  Administered 2019-10-16: 500 mL

## 2019-10-16 MED ORDER — ROCURONIUM BROMIDE 10 MG/ML (PF) SYRINGE
PREFILLED_SYRINGE | INTRAVENOUS | Status: DC | PRN
  Administered 2019-10-16: 50 mg via INTRAVENOUS
  Administered 2019-10-16: 20 mg via INTRAVENOUS
  Administered 2019-10-16: 70 mg via INTRAVENOUS

## 2019-10-16 MED ORDER — ONDANSETRON HCL 4 MG PO TABS: 4.0000 mg | ORAL_TABLET | Freq: Four times a day (QID) | ORAL | Status: DC | PRN

## 2019-10-16 MED ORDER — SENNA 8.6 MG PO TABS
1.0000 | ORAL_TABLET | Freq: Two times a day (BID) | ORAL | Status: DC
  Administered 2019-10-16 – 2019-10-24 (×16): 8.6 mg via ORAL
  Filled 2019-10-16 (×17): qty 1

## 2019-10-16 MED ORDER — FENTANYL CITRATE (PF) 250 MCG/5ML IJ SOLN
INTRAMUSCULAR | Status: DC | PRN
  Administered 2019-10-16: 150 ug via INTRAVENOUS
  Administered 2019-10-16: 100 ug via INTRAVENOUS
  Administered 2019-10-16 (×2): 25 ug via INTRAVENOUS

## 2019-10-16 MED ORDER — MIDAZOLAM HCL 2 MG/2ML IJ SOLN
INTRAMUSCULAR | Status: DC | PRN
  Administered 2019-10-16: 2 mg via INTRAVENOUS

## 2019-10-16 MED ORDER — THROMBIN 20000 UNITS EX SOLR
CUTANEOUS | Status: DC | PRN
  Administered 2019-10-16: 100 mL via TOPICAL

## 2019-10-16 MED ORDER — SODIUM CHLORIDE 0.9 % IV SOLN
250.0000 mL | INTRAVENOUS | Status: DC
  Administered 2019-10-16: 250 mL via INTRAVENOUS

## 2019-10-16 MED ORDER — CHLORHEXIDINE GLUCONATE CLOTH 2 % EX PADS
6.0000 | MEDICATED_PAD | Freq: Every day | CUTANEOUS | Status: DC
  Administered 2019-10-16 – 2019-10-25 (×9): 6 via TOPICAL

## 2019-10-16 MED ORDER — ACETAMINOPHEN 325 MG PO TABS
650.0000 mg | ORAL_TABLET | ORAL | Status: DC | PRN
  Administered 2019-10-18 – 2019-10-19 (×3): 650 mg via ORAL
  Filled 2019-10-16 (×3): qty 2

## 2019-10-16 MED ORDER — OXYCODONE HCL 5 MG PO TABS
5.0000 mg | ORAL_TABLET | ORAL | Status: DC | PRN
  Administered 2019-10-17: 5 mg via ORAL
  Filled 2019-10-16: qty 1

## 2019-10-16 MED ORDER — PHENYLEPHRINE HCL-NACL 10-0.9 MG/250ML-% IV SOLN
INTRAVENOUS | Status: DC | PRN
  Administered 2019-10-16: 25 ug/min via INTRAVENOUS

## 2019-10-16 MED ORDER — VANCOMYCIN HCL IN DEXTROSE 1-5 GM/200ML-% IV SOLN
INTRAVENOUS | Status: AC
  Administered 2019-10-16: 1000 mg
  Filled 2019-10-16: qty 200

## 2019-10-16 MED ORDER — PROPOFOL 10 MG/ML IV BOLUS
INTRAVENOUS | Status: DC | PRN
  Administered 2019-10-16: 100 mg via INTRAVENOUS

## 2019-10-16 MED ORDER — ARTIFICIAL TEARS OPHTHALMIC OINT
TOPICAL_OINTMENT | OPHTHALMIC | Status: AC
  Filled 2019-10-16: qty 3.5

## 2019-10-16 MED ORDER — PROMETHAZINE HCL 25 MG/ML IJ SOLN
25.0000 mg | INTRAMUSCULAR | Status: AC | PRN
  Administered 2019-10-16: 13:00:00 6.25 mg via INTRAVENOUS
  Filled 2019-10-16: qty 1

## 2019-10-16 MED ORDER — HEPARIN SODIUM (PORCINE) 1000 UNIT/ML IJ SOLN
1000.0000 [IU] | INTRAMUSCULAR | Status: DC | PRN
  Filled 2019-10-16: qty 10

## 2019-10-16 MED ORDER — PHENOL 1.4 % MT LIQD: 1.0000 | OROMUCOSAL | Status: DC | PRN

## 2019-10-16 MED ORDER — POTASSIUM CHLORIDE IN NACL 20-0.9 MEQ/L-% IV SOLN
INTRAVENOUS | Status: DC
  Administered 2019-10-16 – 2019-10-21 (×6): via INTRAVENOUS
  Filled 2019-10-16 (×7): qty 1000

## 2019-10-16 MED ORDER — DEXAMETHASONE SODIUM PHOSPHATE 4 MG/ML IJ SOLN
4.0000 mg | Freq: Four times a day (QID) | INTRAMUSCULAR | Status: DC
  Administered 2019-10-16 – 2019-10-22 (×15): 4 mg via INTRAVENOUS
  Filled 2019-10-16 (×15): qty 1

## 2019-10-16 MED ORDER — THROMBIN 5000 UNITS EX SOLR
CUTANEOUS | Status: AC
  Filled 2019-10-16: qty 5000

## 2019-10-16 MED ORDER — LACTATED RINGERS IV SOLN
INTRAVENOUS | Status: DC | PRN
  Administered 2019-10-16: 10:00:00 via INTRAVENOUS

## 2019-10-16 MED ORDER — PROPOFOL 10 MG/ML IV BOLUS
INTRAVENOUS | Status: AC
  Filled 2019-10-16: qty 20

## 2019-10-16 MED ORDER — LACTATED RINGERS IV SOLN
INTRAVENOUS | Status: DC
  Administered 2019-10-16 (×2): via INTRAVENOUS

## 2019-10-16 MED ORDER — MENTHOL 3 MG MT LOZG: 1.0000 | LOZENGE | OROMUCOSAL | Status: DC | PRN

## 2019-10-16 MED ORDER — ONDANSETRON HCL 4 MG/2ML IJ SOLN
4.0000 mg | Freq: Four times a day (QID) | INTRAMUSCULAR | Status: DC | PRN
  Administered 2019-10-17 – 2019-10-21 (×5): 4 mg via INTRAVENOUS
  Filled 2019-10-16 (×5): qty 2

## 2019-10-16 MED ORDER — METHOCARBAMOL 1000 MG/10ML IJ SOLN: 500.0000 mg | Freq: Four times a day (QID) | INTRAVENOUS | Status: DC | PRN

## 2019-10-16 MED ORDER — VANCOMYCIN HCL IN DEXTROSE 1-5 GM/200ML-% IV SOLN
1000.0000 mg | Freq: Two times a day (BID) | INTRAVENOUS | Status: DC
  Administered 2019-10-16 – 2019-10-17 (×2): 1000 mg via INTRAVENOUS
  Filled 2019-10-16 (×3): qty 200

## 2019-10-16 SURGICAL SUPPLY — 69 items
BAG DECANTER FOR FLEXI CONT (MISCELLANEOUS) ×3 IMPLANT
BASKET BONE COLLECTION (BASKET) ×3 IMPLANT
BENZOIN TINCTURE PRP APPL 2/3 (GAUZE/BANDAGES/DRESSINGS) ×6 IMPLANT
BLADE CLIPPER SURG (BLADE) IMPLANT
BONE CANC CHIPS 40CC CAN1/2 (Bone Implant) ×3 IMPLANT
BUR MATCHSTICK NEURO 3.0 LAGG (BURR) ×3 IMPLANT
CANISTER SUCT 3000ML PPV (MISCELLANEOUS) ×3 IMPLANT
CARTRIDGE OIL MAESTRO DRILL (MISCELLANEOUS) ×1 IMPLANT
CHIPS CANC BONE 40CC CAN1/2 (Bone Implant) ×1 IMPLANT
CLOSURE STERI-STRIP 1/2X4 (GAUZE/BANDAGES/DRESSINGS) ×2
CLOSURE WOUND 1/2 X4 (GAUZE/BANDAGES/DRESSINGS) ×2
CLSR STERI-STRIP ANTIMIC 1/2X4 (GAUZE/BANDAGES/DRESSINGS) ×4 IMPLANT
CONT SPEC 4OZ CLIKSEAL STRL BL (MISCELLANEOUS) ×3 IMPLANT
COVER BACK TABLE 60X90IN (DRAPES) ×3 IMPLANT
COVER WAND RF STERILE (DRAPES) ×3 IMPLANT
DERMABOND ADVANCED (GAUZE/BANDAGES/DRESSINGS) ×4
DERMABOND ADVANCED .7 DNX12 (GAUZE/BANDAGES/DRESSINGS) ×2 IMPLANT
DIFFUSER DRILL AIR PNEUMATIC (MISCELLANEOUS) ×3 IMPLANT
DRAPE C-ARM 42X72 X-RAY (DRAPES) ×6 IMPLANT
DRAPE LAPAROTOMY 100X72X124 (DRAPES) ×3 IMPLANT
DRAPE SURG 17X23 STRL (DRAPES) ×3 IMPLANT
DRSG OPSITE POSTOP 4X10 (GAUZE/BANDAGES/DRESSINGS) ×3 IMPLANT
DURAPREP 26ML APPLICATOR (WOUND CARE) ×3 IMPLANT
ELECT REM PT RETURN 9FT ADLT (ELECTROSURGICAL) ×3
ELECTRODE REM PT RTRN 9FT ADLT (ELECTROSURGICAL) ×1 IMPLANT
EVACUATOR 1/8 PVC DRAIN (DRAIN) ×3 IMPLANT
GAUZE 4X4 16PLY RFD (DISPOSABLE) IMPLANT
GLOVE BIO SURGEON STRL SZ 6.5 (GLOVE) ×4 IMPLANT
GLOVE BIO SURGEON STRL SZ7 (GLOVE) ×15 IMPLANT
GLOVE BIO SURGEON STRL SZ8 (GLOVE) ×6 IMPLANT
GLOVE BIO SURGEONS STRL SZ 6.5 (GLOVE) ×2
GLOVE BIOGEL PI IND STRL 7.0 (GLOVE) ×1 IMPLANT
GLOVE BIOGEL PI IND STRL 7.5 (GLOVE) ×1 IMPLANT
GLOVE BIOGEL PI INDICATOR 7.0 (GLOVE) ×2
GLOVE BIOGEL PI INDICATOR 7.5 (GLOVE) ×2
GOWN STRL REUS W/ TWL LRG LVL3 (GOWN DISPOSABLE) ×4 IMPLANT
GOWN STRL REUS W/ TWL XL LVL3 (GOWN DISPOSABLE) ×2 IMPLANT
GOWN STRL REUS W/TWL 2XL LVL3 (GOWN DISPOSABLE) IMPLANT
GOWN STRL REUS W/TWL LRG LVL3 (GOWN DISPOSABLE) ×8
GOWN STRL REUS W/TWL XL LVL3 (GOWN DISPOSABLE) ×4
HEMOSTAT POWDER KIT SURGIFOAM (HEMOSTASIS) ×3 IMPLANT
KIT BASIN OR (CUSTOM PROCEDURE TRAY) ×3 IMPLANT
KIT BONE MRW ASP ANGEL CPRP (KITS) ×6 IMPLANT
KIT TURNOVER KIT B (KITS) ×3 IMPLANT
MILL MEDIUM DISP (BLADE) ×3 IMPLANT
NEEDLE HYPO 25X1 1.5 SAFETY (NEEDLE) ×3 IMPLANT
NS IRRIG 1000ML POUR BTL (IV SOLUTION) ×3 IMPLANT
OIL CARTRIDGE MAESTRO DRILL (MISCELLANEOUS) ×3
PACK LAMINECTOMY NEURO (CUSTOM PROCEDURE TRAY) ×3 IMPLANT
PAD ARMBOARD 7.5X6 YLW CONV (MISCELLANEOUS) ×9 IMPLANT
PUTTY DBM ALLOSYNC PURE 10CC (Putty) ×6 IMPLANT
ROD KODIAK 5.5X300 (Rod) ×6 IMPLANT
SCREW CANC SHANK MOD 5.5X45 (Screw) ×24 IMPLANT
SCREW CANC SHANK MOD 6.5X45 (Screw) ×6 IMPLANT
SCREW POLYAXIAL TULIP (Screw) ×36 IMPLANT
SCREW SHANK MOD 5.5X40 (Screw) ×6 IMPLANT
SET SCREW (Screw) ×24 IMPLANT
SET SCREW SPNE (Screw) ×12 IMPLANT
SPONGE LAP 4X18 RFD (DISPOSABLE) IMPLANT
SPONGE SURGIFOAM ABS GEL 100 (HEMOSTASIS) ×3 IMPLANT
STRIP CLOSURE SKIN 1/2X4 (GAUZE/BANDAGES/DRESSINGS) ×4 IMPLANT
SUT VIC AB 0 CT1 18XCR BRD8 (SUTURE) ×3 IMPLANT
SUT VIC AB 0 CT1 8-18 (SUTURE) ×6
SUT VIC AB 2-0 CP2 18 (SUTURE) ×9 IMPLANT
SUT VIC AB 3-0 SH 8-18 (SUTURE) ×12 IMPLANT
TOWEL GREEN STERILE (TOWEL DISPOSABLE) ×3 IMPLANT
TOWEL GREEN STERILE FF (TOWEL DISPOSABLE) ×3 IMPLANT
TRAY FOLEY MTR SLVR 16FR STAT (SET/KITS/TRAYS/PACK) IMPLANT
WATER STERILE IRR 1000ML POUR (IV SOLUTION) ×3 IMPLANT

## 2019-10-16 NOTE — Anesthesia Postprocedure Evaluation (Signed)
Anesthesia Post Note  Patient: Louis Jordan  Procedure(s) Performed: Thoracic ten - Lumbar four Instrumented Fusion (N/A Back)     Patient location during evaluation: PACU Anesthesia Type: General Level of consciousness: awake and alert Pain management: pain level controlled Vital Signs Assessment: post-procedure vital signs reviewed and stable Respiratory status: spontaneous breathing, nonlabored ventilation, respiratory function stable and patient connected to nasal cannula oxygen Cardiovascular status: blood pressure returned to baseline and stable Postop Assessment: no apparent nausea or vomiting Anesthetic complications: no    Last Vitals:  Vitals:   10/16/19 1445 10/16/19 1500  BP: 131/78 (!) 142/79  Pulse: 69 72  Resp: 13 12  Temp:    SpO2: 100% 99%    Last Pain:  Vitals:   10/16/19 1500  TempSrc:   PainSc: [place] DAVID

## 2019-10-16 NOTE — Anesthesia Procedure Notes (Signed)
Procedure Name: Intubation Date/Time: 10/16/2019 10:46 AM Performed by: Kathryne Hitch, CRNA Pre-anesthesia Checklist: Patient identified, Emergency Drugs available, Suction available and Patient being monitored Patient Re-evaluated:Patient Re-evaluated prior to induction Oxygen Delivery Method: Circle system utilized Preoxygenation: Pre-oxygenation with 100% oxygen Induction Type: IV induction Ventilation: Mask ventilation without difficulty Laryngoscope Size: Miller and 2 Grade View: Grade III Tube type: Oral Tube size: 8.0 mm Number of attempts: 2 Airway Equipment and Method: Stylet and Oral airway Placement Confirmation: ETT inserted through vocal cords under direct vision,  positive ETCO2 and breath sounds checked- equal and bilateral Secured at: 22 cm Tube secured with: Tape Dental Injury: Teeth and Oropharynx as per pre-operative assessment  Comments: CRNA DVL x1 Grade 3 view. Dr. Conrad Maumelle intubated patient.

## 2019-10-16 NOTE — Progress Notes (Signed)
Pt arrived to 6N9, AOx4, VSS, oriented to room, use of bed controls, call bell and phone, placed within reach. No complaints of pain at this time, will continue to monitor.

## 2019-10-16 NOTE — Progress Notes (Signed)
Patient ID: Louis Jordan, male   DOB: [birth year], 63 y.o.   MRN: [id number] BP (!) 147/75 (BP Location: Left Arm)   Pulse 76   Temp 98.7 F (37.1 C) (Axillary)   Resp 15   Ht 6' (1.829 m)   Wt 70.3 kg   SpO2 93%   BMI 21.02 kg/m  Alert and oriented x 4, speech is clear and fluent Moving lower extremities well. Sensation intact but not normal. Feels like a tinge of numbness in his words. Wound is clean and dry.

## 2019-10-16 NOTE — Transfer of Care (Signed)
Immediate Anesthesia Transfer of Care Note  Patient: Tevis Dunavan  Procedure(s) Performed: Thoracic ten - Lumbar four Instrumented Fusion (N/A Back)  Patient Location: PACU  Anesthesia Type:General  Level of Consciousness: drowsy and patient cooperative  Airway & Oxygen Therapy: Patient Spontanous Breathing and Patient connected to face mask oxygen  Post-op Assessment: Report given to RN and Post -op Vital signs reviewed and stable  Post vital signs: Reviewed and stable  Last Vitals:  Vitals Value Taken Time  BP 125/68 10/16/19 1436  Temp    Pulse 67 10/16/19 1437  Resp 9 10/16/19 1437  SpO2 100 % 10/16/19 1437  Vitals shown include unvalidated device data.  Last Pain:  Vitals:   10/16/19 0941  TempSrc:   PainSc: 7       Patients Stated Pain Goal: 0 (01/75/10 2585)  Complications: No apparent anesthesia complications

## 2019-10-16 NOTE — Anesthesia Preprocedure Evaluation (Signed)
Anesthesia Evaluation  Patient identified by MRN, date of birth, ID band Patient awake    Reviewed: Allergy & Precautions, NPO status , Patient's Chart, lab work & pertinent test results  Airway Mallampati: I  TM Distance: >3 FB Neck ROM: Full    Dental   Pulmonary    Pulmonary exam normal        Cardiovascular Normal cardiovascular exam     Neuro/Psych    GI/Hepatic   Endo/Other    Renal/GU      Musculoskeletal   Abdominal   Peds  Hematology   Anesthesia Other Findings   Reproductive/Obstetrics                             Anesthesia Physical Anesthesia Plan  ASA: II  Anesthesia Plan: General   Post-op Pain Management:    Induction: Intravenous  PONV Risk Score and Plan: 2 and Ondansetron and Midazolam  Airway Management Planned: Oral ETT  Additional Equipment:   Intra-op Plan:   Post-operative Plan: Extubation in OR  Informed Consent: I have reviewed the patients History and Physical, chart, labs and discussed the procedure including the risks, benefits and alternatives for the proposed anesthesia with the patient or authorized representative who has indicated his/her understanding and acceptance.       Plan Discussed with: CRNA and Surgeon  Anesthesia Plan Comments:         Anesthesia Quick Evaluation  

## 2019-10-16 NOTE — Op Note (Signed)
10/16/2019  2:23 PM  PATIENT:  Louis Jordan  63 y.o. male  PRE-OPERATIVE DIAGNOSIS: L1 burst fracture with epidural hematoma and canal stenosis  POST-OPERATIVE DIAGNOSIS:  same  PROCEDURE:   1.  Open reduction internal fixation of L1 fracture with decompressive lumbar laminectomy, medial facetectomy and foraminotomy T12-L1 L1-L2 with removal of epidural hematoma and reduction of fracture fragment 2. Posterior fixation T10-L4 using Alphatec cortical pedicle screws.  3. Intertransverse arthrodesis L4 inclusive using morcellized autograft and allograft soaked with a bone marrow aspirate obtained through separate fascial incisions over the left and right iliac crests.  SURGEON:  Sherley Bounds, MD  ASSISTANTS: Glenford Peers, FNP  ANESTHESIA:  General  EBL: 550 ml  Total I/O In: 2900 [I.V.:2400; IV Piggyback:500] Out: 2150 [Urine:1600; Blood:550]  BLOOD ADMINISTERED:none  DRAINS: Medium Hemovac  INDICATION FOR PROCEDURE: This patient presented with L1 burst fracture after a fall from a ladder. Imaging revealed L1 burst fracture.  I recommended decompression and instrumented fusion to address the stenosis as well as the segmental  instability.  Patient understood the risks, benefits, and alternatives and potential outcomes and wished to proceed.  PROCEDURE DETAILS:  The patient was brought to the operating room. After induction of generalized endotracheal anesthesia the patient was rolled into the prone position on chest rolls and all pressure points were padded. The patient's thoracic and lumbar region was cleaned and then prepped with DuraPrep and draped in the usual sterile fashion. Anesthesia was injected and then a dorsal midline incision was made and carried down to the thoracic and lumbosacral fascia. The fascia was opened and the paraspinous musculature was taken down in a subperiosteal fashion to expose T10-L4. A self-retaining retractor was placed. Intraoperative fluoroscopy confirmed  my level. I then dissected in a suprafascial plane to expose the iliac crest.  Opened the fascia and we used a Jamshidi needle to extract 60 cc of bone marrow aspirate from the iliac crests bilaterally with the assistance of my nurse practitioner.  This was then spun down by [facility] device and 2 to 4 cc of  BMAC was soaked on morselized allograft for later arthrodesis.  I dried the holes with Surgifoam and closed the fascia.  I then turned my attention to the decompression and complete lumbar laminectomies, hemi- facetectomies, and foraminotomies were performed at T12-L1 L1-L2.  We wanted to decompress at the L1 pedicle level where he had a retropulsed fracture fragment but he also had a ventral epidural hematoma contributing to the stenosis.  The conus in good below the fracture fragments we were very careful during our decompression.  My nurse practitioner was directly involved in the decompression and exposure of the neural elements. Much more generous decompression and generous foraminotomy was undertaken in order to adequately decompress the neural elements and address the patient's leg pain. The yellow ligament was removed to expose the underlying dura and nerve roots, and generous foraminotomies were performed to adequately decompress the neural elements. Both the exiting and traversing nerve roots were decompressed on both sides until a coronary dilator passed easily along the nerve roots.  We then palpated at the pedicle level and felt the fracture fragments.  I was able to remove a large amount of ventral epidural hematoma until I could see the posterior part of the vertebral body of L1.  Was able to use a S&D footplate to try to reduce the fracture fragment as best I could.  I checked this decompression by placing an instrument on the back of the  vertebral body at the level.  once the decompression was complete, I turned my attention to the placement of the pedicle screws.  Screws were placed at T10, T11,  T12, L2, L3, and L4 bilaterally.  The pedicle screw entry zones were identified utilizing surface landmarks and AP and lateral fluoroscopy.  In the thoracic spine we used a pedicle probe to probe each pedicle and then tapped with a 4.5 mm tap and then placed 5.5 x 45 mm pedicle screws into the pedicles from T10-T12 bilaterally.  In the lumbar spine we placed medialized cortical pedicle screws.  I drilled into each pedicle utilizing the hand drill, and tapped each pedicle with the appropriate tap. We palpated with a ball probe to assure no break in the cortex. We then placed 5.5 x 45 mm cortical pedicle screws into the pedicles bilaterally at L2 and L3 and 5.5 x 45 mm screws at L4.  My nurse practitioner assisted in placement of the pedicle screws.  We then decorticated the transverse processes and laid a mixture of morcellized autograft and allograft out over these to perform intertransverse arthrodesis at T10-L4. We then cut and shaped long thoracolumbar rods with a slight kyphotic curvature in the thoracic spine and lordotic curvature in the lumbar spine.  Placed  rods into the multiaxial screw heads of the pedicle screws and locked these in position with the locking caps and anti-torque device. We then checked our construct with AP and lateral fluoroscopy. Irrigated with copious amounts of bacitracin-containing saline solution. Inspected the dura once again to assure adequate decompression, lined to the dura with Gelfoam, placed powdered vancomycin into the wound, used a medium Hemovac drain through a separate stab incision, and then we closed the muscle and the fascia with 0 Vicryl. Closed the subcutaneous tissues with 2-0 Vicryl and subcuticular tissues with 3-0 Vicryl. The skin was closed with benzoin and Steri-Strips. Dressing was then applied, the patient was awakened from general anesthesia and transported to the recovery room in stable condition. At the end of the procedure all sponge, needle and  instrument counts were correct.   PLAN OF CARE: admit to inpatient  PATIENT DISPOSITION:  PACU - hemodynamically stable.   Delay start of Pharmacological VTE agent (>24hrs) due to surgical blood loss or risk of bleeding:  yes

## 2019-10-16 NOTE — Progress Notes (Signed)
Trauma Service Note  Chief Complaint/Subjective: Moderate pain, nausea 6 h ago  Objective: Vital signs in last 24 hours: Temp:  [97.5 F (36.4 C)-99.3 F (37.4 C)] 98.7 F (37.1 C) (11/01 1655) Pulse Rate:  [69-78] 76 (11/01 1655) Resp:  [9-21] 15 (11/01 1655) BP: (125-162)/(68-88) 147/75 (11/01 1655) SpO2:  [92 %-100 %] 93 % (11/01 1655) Weight:  [68 kg-70.3 kg] 70.3 kg (11/01 1655) Last BM Date: 10/15/19  Intake/Output from previous day: 10/31 0701 - 11/01 0700 In: -  Out: 775 [Urine:775] Intake/Output this shift: Total I/O In: 260 [Other:260] Out: -   General: NAD  Lungs: nonlabored  Abd: RRR  Extremities: soft, left leg splint  Neuro: GCS 15, moves all extremities, no numbness  Lab Results: CBC  Recent Labs    10/15/19 1556 10/15/19 1606  WBC 16.0*  --   HGB 13.5 12.9*  HCT 39.7 38.0*  PLT 264  --    BMET Recent Labs    10/15/19 1556 10/15/19 1606  NA 141 140  K 3.4* 3.5  CL 106 105  CO2 26  --   GLUCOSE 120* 117*  BUN 23 22  CREATININE 1.00 0.90  CALCIUM 9.2  --    PT/INR Recent Labs    10/15/19 1556  LABPROT 14.1  INR 1.1   ABG No results for input(s): PHART, HCO3 in the last 72 hours.  Invalid input(s): PCO2, PO2  Studies/Results: Dg Thoracolumabar Spine  Result Date: 10/16/2019 CLINICAL DATA:  T10 through L4 PLIF EXAM: DG C-ARM 1-60 MIN; THORACOLUMBAR SPINE - 2 VIEW FLUOROSCOPY TIME:  Fluoroscopy Time:  2 minutes and 11 seconds COMPARISON:  None. FINDINGS: Six intraoperative fluoroscopic images are provided showing posterior inter pedicular fixation hardware traversing the thoracolumbar junction as indicated by the given clinical data. Hardware appears intact and appropriately positioned. Fluoroscopy was provided for 2 minutes and 11 seconds. IMPRESSION: Intraoperative fluoroscopic images demonstrating posterior interpedicular fixation hardware traversing the thoracolumbar junction. No evidence of surgical complicating feature.  Electronically Signed   By: Bary Richard M.D.   On: 10/16/2019 15:47   Dg C-arm 1-60 Min  Result Date: 10/16/2019 CLINICAL DATA:  T10 through L4 PLIF EXAM: DG C-ARM 1-60 MIN; THORACOLUMBAR SPINE - 2 VIEW FLUOROSCOPY TIME:  Fluoroscopy Time:  2 minutes and 11 seconds COMPARISON:  None. FINDINGS: Six intraoperative fluoroscopic images are provided showing posterior inter pedicular fixation hardware traversing the thoracolumbar junction as indicated by the given clinical data. Hardware appears intact and appropriately positioned. Fluoroscopy was provided for 2 minutes and 11 seconds. IMPRESSION: Intraoperative fluoroscopic images demonstrating posterior interpedicular fixation hardware traversing the thoracolumbar junction. No evidence of surgical complicating feature. Electronically Signed   By: Bary Richard M.D.   On: 10/16/2019 15:47    Anti-infectives: Anti-infectives (From admission, onward)   Start     Dose/Rate Route Frequency Ordered Stop   10/16/19 2130  vancomycin (VANCOCIN) IVPB 1000 mg/200 mL premix  Status:  Discontinued     1,000 mg 200 mL/hr over 60 Minutes Intravenous  Once 10/16/19 1704 10/16/19 1737   10/16/19 2130  vancomycin (VANCOCIN) IVPB 1000 mg/200 mL premix     1,000 mg 200 mL/hr over 60 Minutes Intravenous Every 12 hours 10/16/19 1737     10/16/19 1342  vancomycin (VANCOCIN) powder  Status:  Discontinued       As needed 10/16/19 1342 10/16/19 1431   10/16/19 1049  bacitracin 50,000 Units in sodium chloride 0.9 % 500 mL irrigation  Status:  Discontinued  As needed 10/16/19 1049 10/16/19 1431   10/16/19 0922  vancomycin (VANCOCIN) 1-5 GM/200ML-% IVPB    Note to Pharmacy: Bobbie Stack   : cabinet override      10/16/19 0922 10/16/19 1740      Medications Scheduled Meds: . celecoxib  200 mg Oral Q12H  . Chlorhexidine Gluconate Cloth  6 each Topical Daily  . dexamethasone (DECADRON) injection  4 mg Intravenous Q6H   Or  . dexamethasone  4 mg Oral Q6H  . senna   1 tablet Oral BID  . sodium chloride flush  3 mL Intravenous Q12H   Continuous Infusions: . sodium chloride 250 mL (10/16/19 1742)  . 0.9 % NaCl with KCl 20 mEq / L 75 mL/hr at 10/16/19 1742  . methocarbamol (ROBAXIN) IV    . vancomycin     PRN Meds:.acetaminophen **OR** acetaminophen, HYDROmorphone (DILAUDID) injection, menthol-cetylpyridinium **OR** phenol, methocarbamol **OR** methocarbamol (ROBAXIN) IV, ondansetron **OR** ondansetron (ZOFRAN) IV, oxyCODONE, sodium chloride flush  Assessment/Plan: s/p Procedure(s): Thoracic ten - Lumbar four Instrumented Fusion  Fall from ladder L 1 burst fracture - fusion 11/1 Ronnald Ramp) L anterior shoulder dislocation - Landau, MRI soon L ankle trimalleolar fracture - Landau, CT soon FEN- reg diet VTE- mechanical ID- no ID issues Dispo- continue floor, PT/OT, additional ortho workup    LOS: 1 day   [facility] Surgeon [phone] Surgery 10/16/2019

## 2019-10-16 NOTE — Progress Notes (Addendum)
Pharmacy Antibiotic Note  Louis Jordan is a 63 y.o. male admitted on 10/15/2019 s/p fall and underwent ORIF of L1 fracture with removal of epidural hematoma.  Pharmacy has been consulted for vancomycin dosing for surgical prophylaxis.  Patient has a hemovac per RN.  SCr 0.9, CrCL 82 ml/min.  Noted patient received pre-op vancomycin 1gm IV around 0930.  Plan: Vanc 1gm IV Q12H for trough 10-15 mcg/mL Monitor renal fxn, clinical progress F/U abx LOT   Height: 6' (182.9 cm) Weight: 154 lb 15.7 oz (70.3 kg) IBW/kg (Calculated) : 77.6  Temp (24hrs), Avg:98.5 F (36.9 C), Min:97.5 F (36.4 C), Max:99.3 F (37.4 C)  Recent Labs  Lab 10/15/19 1556 10/15/19 1606 10/15/19 1614  WBC 16.0*  --   --   CREATININE 1.00 0.90  --   LATICACIDVEN  --   --  0.9    Estimated Creatinine Clearance: 82.5 mL/min (by C-G formula based on SCr of 0.9 mg/dL).    Allergies  Allergen Reactions  . Penicillins Swelling    Vanc 11/1 >>   Louis Bureau D. Mina Jordan, PharmD, BCPS, [place] 10/16/2019, 5:02 PM

## 2019-10-16 NOTE — Progress Notes (Signed)
Patient complains of left ankle pain and back pain.  States he may have "a little" numbness in the left leg.  Denies weakness.  Wiggles his toes well and seems to move his legs okay in bed.  Difficult to fully examine while lying flat in a cast on the left ankle.  Foley catheter in place.  He is scheduled for open reduction internal fixation of L1 fracture later this morning.

## 2019-10-16 NOTE — Consult Note (Addendum)
` ORTHOPAEDIC CONSULTATION  REQUESTING PHYSICIAN: Md, Trauma, MD  Chief Complaint: Fall from a ladder  HPI: Louis Jordan is a 63 y.o. male who complains of acute severe pain over the left ankle, left shoulder, and back.  He was a trauma activation today, who fell about 12 feet from a ladder.  He denies loss of consciousness.  History reviewed. No pertinent past medical history. History reviewed. No pertinent surgical history. Social History   Socioeconomic History  . Marital status: Married    Spouse name: Not on file  . Number of children: Not on file  . Years of education: Not on file  . Highest education level: Not on file  Occupational History  . Not on file  Social Needs  . Financial resource strain: Not on file  . Food insecurity    Worry: Not on file    Inability: Not on file  . Transportation needs    Medical: Not on file    Non-medical: Not on file  Tobacco Use  . Smoking status: Not on file  Substance and Sexual Activity  . Alcohol use: Not on file  . Drug use: Not on file  . Sexual activity: Not on file  Lifestyle  . Physical activity    Days per week: Not on file    Minutes per session: Not on file  . Stress: Not on file  Relationships  . Social Musician on phone: Not on file    Gets together: Not on file    Attends religious service: Not on file    Active member of club or organization: Not on file    Attends meetings of clubs or organizations: Not on file    Relationship status: Not on file  Other Topics Concern  . Not on file  Social History Narrative  . Not on file   History reviewed. No pertinent family history. Allergies  Allergen Reactions  . Penicillins Swelling     Positive ROS: All other systems have been reviewed and were otherwise negative with the exception of those mentioned in the HPI and as above.  Physical Exam: General: The patient is somewhat lethargic, and not a very good historian.  He is somewhat confused right  now, and is talking about a sofa in the front yard. Cardiovascular: No pedal edema, his left lower extremity is splinted. Respiratory: No cyanosis, no use of accessory musculature GI: No organomegaly, abdomen is soft and non-tender Skin: No bleeding in his left ankle by report.  Left shoulder skin is intact. Neurologic: Sensation intact distally to the best that I can appreciate, but his exam is fairly limited. Psychiatric: Patient is somewhat confused and does not answer questions clearly but does follow some commands. Lymphatic: No axillary or cervical lymphadenopathy  MUSCULOSKELETAL: Left shoulder has normal appearing contour.  All fingers flex extend and abduct in his hand.  His left foot is splinted, he can move his toes on both feet.  Assessment: Active Problems:   Fracture of lumbar spine without lesion of spinal cord (HCC)  Comminuted left ankle fracture Left shoulder glenohumeral dislocation  Plan: His unstable spine fracture takes precedence over his orthopedic injuries currently, but ultimately he will need to have a thorough evaluation of his shoulder, including an MRI, and also a surgical fixation on his ankle.  I will plan to get a CAT scan of his ankle and an MRI of the shoulder once he stabilizes from a neurosurgical standpoint.  I will have  further discussions with him once he is more awake.  I discussed with Dr. Ronnald Ramp and Dr. Georgette Dover his cognitive status, and it is thought that it is more secondary to medication than it is to any type of head injury.  He is planning for spine surgery tomorrow morning, and likely definitive management of his ankle next week.  Splint and elevate in the meantime.  The talus appears under the tibia, I do not think he needs a repeat reduction on his ankle.    Johnny Bridge, MD Cell [phone]   10/16/2019 12:25 AM

## 2019-10-17 ENCOUNTER — Inpatient Hospital Stay (HOSPITAL_COMMUNITY): Payer: Commercial Managed Care - PPO

## 2019-10-17 ENCOUNTER — Encounter (HOSPITAL_COMMUNITY): Payer: Self-pay | Admitting: Anesthesiology

## 2019-10-17 LAB — CBC
HCT: 29.9 % — ABNORMAL LOW (ref 39.0–52.0)
Hemoglobin: 10 g/dL — ABNORMAL LOW (ref 13.0–17.0)
MCH: 31.3 pg (ref 26.0–34.0)
MCHC: 33.4 g/dL (ref 30.0–36.0)
MCV: 93.7 fL (ref 80.0–100.0)
Platelets: 195 10*3/uL (ref 150–400)
RBC: 3.19 MIL/uL — ABNORMAL LOW (ref 4.22–5.81)
RDW: 12.9 % (ref 11.5–15.5)
WBC: 12.2 10*3/uL — ABNORMAL HIGH (ref 4.0–10.5)
nRBC: 0 % (ref 0.0–0.2)

## 2019-10-17 LAB — BASIC METABOLIC PANEL
Anion gap: 6 (ref 5–15)
BUN: 16 mg/dL (ref 8–23)
CO2: 29 mmol/L (ref 22–32)
Calcium: 8.2 mg/dL — ABNORMAL LOW (ref 8.9–10.3)
Chloride: 101 mmol/L (ref 98–111)
Creatinine, Ser: 0.93 mg/dL (ref 0.61–1.24)
GFR calc Af Amer: >60 mL/min (ref >60)
GFR calc non Af Amer: >60 mL/min (ref >60)
Glucose, Bld: 159 mg/dL — ABNORMAL HIGH (ref 70–99)
Potassium: 4.4 mmol/L (ref 3.5–5.1)
Sodium: 136 mmol/L (ref 135–145)

## 2019-10-17 MED ORDER — BUPIVACAINE HCL (PF) 0.5 % IJ SOLN
10.0000 mL | Freq: Once | INTRAMUSCULAR | Status: AC
  Administered 2019-10-17: 10 mL
  Filled 2019-10-17 (×2): qty 10

## 2019-10-17 MED ORDER — METHOCARBAMOL 500 MG PO TABS
500.0000 mg | ORAL_TABLET | Freq: Four times a day (QID) | ORAL | Status: DC
  Administered 2019-10-17 – 2019-10-25 (×33): 500 mg via ORAL
  Filled 2019-10-17 (×33): qty 1

## 2019-10-17 MED ORDER — MORPHINE SULFATE (PF) 2 MG/ML IV SOLN
1.0000 mg | INTRAVENOUS | Status: DC | PRN
  Administered 2019-10-17: 4 mg via INTRAVENOUS
  Filled 2019-10-17: qty 2

## 2019-10-17 MED ORDER — OXYCODONE HCL 5 MG/5ML PO SOLN
5.0000 mg | ORAL | Status: DC | PRN
  Administered 2019-10-18 – 2019-10-19 (×5): 10 mg via ORAL
  Filled 2019-10-17 (×5): qty 10

## 2019-10-17 MED ORDER — OXYCODONE HCL 5 MG PO TABS
5.0000 mg | ORAL_TABLET | ORAL | Status: DC | PRN
  Administered 2019-10-17: 10 mg via ORAL
  Filled 2019-10-17: qty 2

## 2019-10-17 MED ORDER — LIDOCAINE HCL 1 % IJ SOLN
10.0000 mL | Freq: Once | INTRAMUSCULAR | Status: AC
  Administered 2019-10-17: 10 mL
  Filled 2019-10-17 (×2): qty 10

## 2019-10-17 MED ORDER — MORPHINE SULFATE (PF) 4 MG/ML IV SOLN
4.0000 mg | Freq: Four times a day (QID) | INTRAVENOUS | Status: DC | PRN
  Administered 2019-10-17 – 2019-10-19 (×5): 4 mg via INTRAVENOUS
  Filled 2019-10-17 (×5): qty 1

## 2019-10-17 MED FILL — Thrombin For Soln 5000 Unit: CUTANEOUS | Qty: 5000 | Status: AC

## 2019-10-17 MED FILL — Gelatin Absorbable MT Powder: OROMUCOSAL | Qty: 1 | Status: AC

## 2019-10-17 NOTE — Progress Notes (Addendum)
Patient c/o feeling extreme pain from swelling in left leg and requested this nurse reach out to orthopedic Dr. This nurse spoke with Dr Marchia Bond who asked to have the orthopedic tech come up and change patients splint. This nurse text paged Ortho tech and is awaiting call back. Message passed on to patient as well as confirmation to patient and patient's wife that he is on the surgical schedule for tomorrow morning at 0745  This nurse also assessed patient pedal pulse which is 2+ as well as capillary refill <3 seconds.

## 2019-10-17 NOTE — Progress Notes (Signed)
Orthopedic Tech Progress Note Patient Details:  Louis Jordan Oct 05, 1956 [id number]  Ortho Devices Type of Ortho Device: Sling immobilizer Ortho Device/Splint Location: LUE Ortho Device/Splint Interventions: Adjustment, Application, Ordered   Post Interventions Patient Tolerated: Well Instructions Provided: Care of device, Adjustment of device   [name] 10/17/2019, 2:34 PM

## 2019-10-17 NOTE — Progress Notes (Addendum)
Patient ID: Louis Jordan, male   DOB: 10-29-1956, 63 y.o.   MRN: [id number]   LOS: 2 days   Subjective: Left shoulder pain, feels like it came out of joint this morning. He says sling was removed when he transferred from ICU to floor.   Objective: Vital signs in last 24 hours: Temp:  [97.5 F (36.4 C)-99 F (37.2 C)] 98.8 F (37.1 C) (11/02 0422) Pulse Rate:  [62-76] 62 (11/02 0422) Resp:  [9-18] 18 (11/02 0422) BP: (121-147)/(60-79) 123/72 (11/02 0422) SpO2:  [92 %-100 %] 97 % (11/02 0422) Weight:  [70.3 kg] 70.3 kg (11/01 1655) Last BM Date: 10/15/19   Laboratory  CBC Recent Labs    10/15/19 1556 10/15/19 1606 10/17/19 1024  WBC 16.0*  --  12.2*  HGB 13.5 12.9* 10.0*  HCT 39.7 38.0* 29.9*  PLT 264  --  195   BMET Recent Labs    10/15/19 1556 10/15/19 1606 10/17/19 1024  NA 141 140 136  K 3.4* 3.5 4.4  CL 106 105 101  CO2 26  --  29  GLUCOSE 120* 117* 159*  BUN 23 22 16   CREATININE 1.00 0.90 0.93  CALCIUM 9.2  --  8.2*     Physical Exam General appearance: alert and no distress  LUE: Ant shoulder dislocation apparent. Some paresthesias of hand. Motor fxn intact.   Assessment/Plan: Left shoulder dislocation -- Will plan on closed reduction followed by CT scan for surgical planning. Sling at all times.    Lisette Abu, PA-C Orthopedic Surgery [phone] 10/17/2019   Discussed and agree with above.   Johnny Bridge, MD

## 2019-10-17 NOTE — Procedures (Addendum)
Procedure: Left shoulder closed reduction  Indication: Left shoulder dislocation  Surgeon: Silvestre Gunner, PA-C  Assist: None  Anesthesia: 66ml 1% lidocaine, 74ml 0.5% Marcaine, 4mg  morphine  EBL: None  Complications: None  Findings: After risks/benefits explained patient desires to undergo procedure. Consent obtained and time out performed. The left shoulder joint was injected with anesthetic. After time for the anesthetics to take effect shoulder was reduced without difficulty. X-rays pending. Pt tolerated the procedure well.    Lisette Abu, PA-C Orthopedic Surgery [phone]  Discussed and agree with above. Will likely involve Dr. Griffin Basil for management of recurrent instability, large glenoid fragment, may need ORIF vs. Reverse TSA, will get CT with blueprint protocol for preop planning. Likely surgery wed.    Johnny Bridge, MD

## 2019-10-17 NOTE — Progress Notes (Signed)
Patient denies leg pain or numbness or tingling in his legs.  He complains of pain in the arch of the left foot where he has a broken ankle.  He has good strength in his lower extremities in all muscle groups that are testable.  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  He had signal change in his conus on his preoperative MRI.  Hopefully this will improve now that things are decompressed and stabilized.  However he will need his Foley catheter back.  He is having urinary retention at this time.  Mobilize today in TLSO.

## 2019-10-17 NOTE — Progress Notes (Signed)
OT Cancellation Note  Patient Details Name: Louis Jordan MRN: [id number] DOB: [birth year]   Cancelled Treatment:    Reason Eval/Treat Not Completed: Patient not medically ready. Possible shoulder dislocation again this AM and awaiting ortho. Ankle to be assessed further as well. OT will follow once appropriate.   Darleen Crocker P MS, OTR/L 10/17/2019, 10:06 AM

## 2019-10-17 NOTE — Progress Notes (Addendum)
Patient felt that his bladder is not full and needed more time and water to drink before he will void, does not want to have In& Out cath yet.  Perineum area has sensation but a little numb.  Gave ice water and water pitcher to drink.  Will endorse appropriately to day shift RN.

## 2019-10-17 NOTE — Progress Notes (Signed)
Patient ID: Louis Jordan, male   DOB: 1956-07-12, 62 y.o.   MRN: [id number]    1 Day Post-Op  Subjective: Patient unable to void and really hurting because of this.  Having some back pain after surgery yesterday, but greatest pain is in his left ankle. He states his shoulder feels ok and began to raise it to show me it feels ok and then had tremendous difficulty getting it back down and in the process possibly dislocated it again.  He states" it is no longer in the normal position and now my hand is going numb."  Ate breakfast this morning  ROS: See above, otherwise other systems negative  Objective: Vital signs in last 24 hours: Temp:  [97.5 F (36.4 C)-99 F (37.2 C)] 98.8 F (37.1 C) (11/02 0422) Pulse Rate:  [62-77] 62 (11/02 0422) Resp:  [9-18] 18 (11/02 0422) BP: (121-150)/(60-79) 123/72 (11/02 0422) SpO2:  [92 %-100 %] 97 % (11/02 0422) Weight:  [70.3 kg] 70.3 kg (11/01 1655) Last BM Date: 10/15/19  Intake/Output from previous day: 11/01 0701 - 11/02 0700 In: 4599.1 [P.O.:480; I.V.:3219.1; IV Piggyback:900] Out: 6761 [Urine:2650; Drains:435; Blood:550] Intake/Output this shift: No intake/output data recorded.  PE: Gen: NAD Heart: regular Lungs: CTAB Abd: soft, bloating in lower abdomen with some tension likely secondary to distended bladder.  Some BS noted, tender over lower abdomen, but otherwise nontender GU: peroneum is numb Ext: left ankle in case, good cap refill, wiggles toes.  Abnormality of left shoulder after spontaneously raising it on his own.  Back with Davol drain in place with bloody output, but minimal currently Psych: A&O x3  Lab Results:  Recent Labs    10/15/19 1556 10/15/19 1606  WBC 16.0*  --   HGB 13.5 12.9*  HCT 39.7 38.0*  PLT 264  --    BMET Recent Labs    10/15/19 1556 10/15/19 1606  NA 141 140  K 3.4* 3.5  CL 106 105  CO2 26  --   GLUCOSE 120* 117*  BUN 23 22  CREATININE 1.00 0.90  CALCIUM 9.2  --    PT/INR Recent Labs     10/15/19 1556  LABPROT 14.1  INR 1.1   CMP     Component Value Date/Time   NA 140 10/15/2019 1606   K 3.5 10/15/2019 1606   CL 105 10/15/2019 1606   CO2 26 10/15/2019 1556   GLUCOSE 117 (H) 10/15/2019 1606   BUN 22 10/15/2019 1606   CREATININE 0.90 10/15/2019 1606   CALCIUM 9.2 10/15/2019 1556   PROT 6.1 (L) 10/15/2019 1556   ALBUMIN 3.9 10/15/2019 1556   AST 36 10/15/2019 1556   ALT 28 10/15/2019 1556   ALKPHOS 53 10/15/2019 1556   BILITOT 0.6 10/15/2019 1556   GFRNONAA >60 10/15/2019 1556   GFRAA >60 10/15/2019 1556   Lipase  No results found for: LIPASE     Studies/Results: Dg Thoracolumabar Spine  Result Date: 10/16/2019 CLINICAL DATA:  T10 through L4 PLIF EXAM: DG C-ARM 1-60 MIN; THORACOLUMBAR SPINE - 2 VIEW FLUOROSCOPY TIME:  Fluoroscopy Time:  2 minutes and 11 seconds COMPARISON:  None. FINDINGS: Six intraoperative fluoroscopic images are provided showing posterior inter pedicular fixation hardware traversing the thoracolumbar junction as indicated by the given clinical data. Hardware appears intact and appropriately positioned. Fluoroscopy was provided for 2 minutes and 11 seconds. IMPRESSION: Intraoperative fluoroscopic images demonstrating posterior interpedicular fixation hardware traversing the thoracolumbar junction. No evidence of surgical complicating feature. Electronically Signed   By:  Bary Richard M.D.   On: 10/16/2019 15:47   Dg Tibia/fibula Left  Result Date: 10/15/2019 CLINICAL DATA:  Pt arrives via EMS from home with reports of working on a 20 ft ladder when it slipped out and he fell on his back on the pavement. Estimated falling 12 ft. Denies LOC. Deformity to left ankle and dislocation to left shoulder EXAM: LEFT TIBIA AND FIBULA - 2 VIEW COMPARISON:  None. FINDINGS: Ankle fractures. There is a transverse fracture across the distal fibula, at the metadiaphysis. There is an oblique fracture across the medial aspect of the distal tibia extending to the  medial tibial plafond, with flexure displaced proximally 5 mm medially and superiorly. There is widening of the lateral margin of the ankle joint. No talar subluxation. No additional fractures.  Knee joint normally aligned. 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 IMPRESSION: 1. Ankle fractures with oblique fracture across the medial distal tibia, above the base of the medial malleolus, and a lateral fracture of the distal fibula. 2. No other fractures.  No dislocation. 3. 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 Electronically Signed   By: Amie Portland M.D.   On: 10/15/2019 16:19   Dg Ankle Complete Left  Result Date: 10/15/2019 CLINICAL DATA:  Status post reduction EXAM: LEFT ANKLE COMPLETE - 3+ VIEW COMPARISON:  October 15, 2019 FINDINGS: The patient has been placed in a cast. The patient's trimalleolar fracture demonstrates similar alignment in the interval. IMPRESSION: The patient has been placed in a cast. No significant change in alignment. Electronically Signed   By: Gerome Sam III M.D   On: 10/15/2019 18:13   Ct Head Wo Contrast  Result Date: 10/15/2019 CLINICAL DATA:  Was working on a 20 foot ladder at home when it slipped out and he fell onto his back on pavement, estimated fall height 12 feet, denies loss of consciousness EXAM: CT HEAD WITHOUT CONTRAST CT CERVICAL SPINE WITHOUT CONTRAST TECHNIQUE: Multidetector CT imaging of the head and cervical spine was performed following the standard protocol without intravenous contrast. Multiplanar CT image reconstructions of the cervical spine were also generated. COMPARISON:  None FINDINGS: CT HEAD FINDINGS Brain: Normal ventricular morphology. No midline shift or mass effect. Normal appearance of brain parenchyma. No intracranial hemorrhage, mass lesion or evidence of acute infarction. No extra-axial fluid collections. Vascular: Minimal atherosclerotic calcification of internal carotid and vertebral arteries at skull  base Skull: Intact Sinuses/Orbits: Clear Other: N/A CT CERVICAL SPINE FINDINGS Alignment: Normal Skull base and vertebrae: Mild osseous demineralization. Skull base intact. Prior anterior fusion C5-C7 with good bony fusion. Vertebral body heights maintained. Disc space narrowing greatest at C4-C5 with minimal retrolisthesis. Mild scattered facet degenerative changes greatest at C7-T1. No fracture, additional subluxation or bone destruction. Or Soft tissues and spinal canal: Prevertebral soft tissues normal thickness. Remaining visualized cervical soft tissues unremarkable. Disc levels:  No specific abnormalities Upper chest: Lung apices clear Other: N/A IMPRESSION: No acute intracranial abnormalities. Prior anterior C5-C7 cervical spine fusion. Mild scattered degenerative disc and facet disease changes cervical spine. No acute cervical spine abnormalities. Electronically Signed   By: Ulyses Southward M.D.   On: 10/15/2019 16:54   Ct Chest W Contrast  Result Date: 10/15/2019 CLINICAL DATA:  Larey Seat off ladder today. EXAM: CT CHEST, ABDOMEN, AND PELVIS WITH CONTRAST TECHNIQUE: Multidetector CT imaging of the chest, abdomen and pelvis was performed following the standard protocol during bolus administration of intravenous contrast. CONTRAST:  OMNIPAQUE IOHEXOL 300 MG/ML  SOLN COMPARISON:  None. FINDINGS: CT CHEST FINDINGS Cardiovascular: The heart is normal in size. No pericardial effusion. Mild tortuosity, ectasia and calcification of the thoracic aorta but no aneurysm or dissection. The branch vessels are patent. Scattered coronary artery calcifications are noted. Mediastinum/Nodes: No mediastinal or hilar mass or lymphadenopathy. No mediastinal hematoma. The esophagus is grossly normal. Lungs/Pleura: No acute pulmonary findings. No pulmonary contusions or pneumothorax. Dependent bibasilar subpleural atelectasis. No pleural effusion. No worrisome pulmonary lesions. A few small pulmonary nodules are likely benign.  Mild bronchiectasis is noted. No interstitial lung disease. Musculoskeletal: No chest wall mass, supraclavicular or axillary adenopathy. The thoracic vertebral bodies are normally aligned. No acute fracture. The sternum is intact. CT ABDOMEN PELVIS FINDINGS Hepatobiliary: No focal hepatic lesions or acute hepatic injury. No perihepatic fluid collections. The gallbladder is normal. No common bile duct dilatation. Pancreas: No mass, inflammation or ductal dilatation. No acute injury or peripancreatic fluid collection. Spleen: Normal size. No acute injury or perisplenic fluid collection. Adrenals/Urinary Tract: The adrenal glands and kidneys are unremarkable. No acute renal injury or perinephric fluid collection. The bladder is unremarkable. Stomach/Bowel: The stomach, duodenum, small bowel and colon are grossly normal without oral contrast. No obvious acute inflammatory process, mass lesion or obstructive findings. No free air is identified. Vascular/Lymphatic: Moderate atherosclerotic calcifications involving the distal aorta and iliac arteries. No aneurysm or dissection. The major venous structures are patent. No mesenteric or retroperitoneal mass, adenopathy or hematoma. Reproductive: The prostate gland and seminal vesicles are unremarkable. Other: No free pelvic fluid collections, pelvic hematoma or inguinal mass. Musculoskeletal: There is a significant burst type fracture of L1 with retropulsion of the posterosuperior aspect of the vertebral body into the spinal canal. There is approximately 50-60% canal compromise. The pedicles are intact but there is a fracture extending through the left lamina and the left facet joint and left transverse process. I do not see any definite fractures above or below this level. Paraspinal hematoma is noted along with some blood extending up into the retrocrural space. The posterior ribs are intact. IMPRESSION: 1. Significant burst type fracture of L1 with retropulsion and spinal  canal compromise. 2. There is also involvement of the posterior elements on the left side. 3. No acute intra-abdominal/intrapelvic injury is identified. The solid abdominal organs are intact and no definite findings for bowel injury. 4. Bibasilar atelectasis but no acute lung injury. 5. Normal appearance of the heart and great vessels. These results were called by telephone at the time of interpretation on 10/15/2019 at 4:57 pm to provider [facility] , who verbally acknowledged these results. Electronically Signed   By: Rudie Meyer M.D.   On: 10/15/2019 16:58   Ct Cervical Spine Wo Contrast  Result Date: 10/15/2019 CLINICAL DATA:  Was working on a 20 foot ladder at home when it slipped out and he fell onto his back on pavement, estimated fall height 12 feet, denies loss of consciousness EXAM: CT HEAD WITHOUT CONTRAST CT CERVICAL SPINE WITHOUT CONTRAST TECHNIQUE: Multidetector CT imaging of the head and cervical spine was performed following the standard protocol without intravenous contrast. Multiplanar CT image reconstructions of the cervical spine were also generated. COMPARISON:  None FINDINGS: CT HEAD FINDINGS Brain: Normal ventricular morphology. No midline shift or mass effect. Normal appearance of brain parenchyma. No intracranial hemorrhage, mass lesion or evidence of acute infarction. No extra-axial fluid collections. Vascular: Minimal atherosclerotic calcification of internal carotid and vertebral arteries at skull base Skull: Intact Sinuses/Orbits: Clear Other: N/A CT CERVICAL SPINE FINDINGS Alignment: Normal  Skull base and vertebrae: Mild osseous demineralization. Skull base intact. Prior anterior fusion C5-C7 with good bony fusion. Vertebral body heights maintained. Disc space narrowing greatest at C4-C5 with minimal retrolisthesis. Mild scattered facet degenerative changes greatest at C7-T1. No fracture, additional subluxation or bone destruction. Or Soft tissues and spinal canal:  Prevertebral soft tissues normal thickness. Remaining visualized cervical soft tissues unremarkable. Disc levels:  No specific abnormalities Upper chest: Lung apices clear Other: N/A IMPRESSION: No acute intracranial abnormalities. Prior anterior C5-C7 cervical spine fusion. Mild scattered degenerative disc and facet disease changes cervical spine. No acute cervical spine abnormalities. Electronically Signed   By: Ulyses Southward M.D.   On: 10/15/2019 16:54   Mr Lumbar Spine Wo Contrast  Result Date: 10/15/2019 CLINICAL DATA:  12 feet fall from ladder. L1 burst fracture. EXAM: MRI LUMBAR SPINE WITHOUT CONTRAST TECHNIQUE: Multiplanar, multisequence MR imaging of the lumbar spine was performed. No intravenous contrast was administered. COMPARISON:  CT of the abdomen and pelvis contrast 10/15/2019 FINDINGS: Segmentation: 5 non rib-bearing lumbar type vertebral bodies are present. The lowest fully formed vertebral body is L5. Alignment: There straightening of the normal lumbar lordosis. No significant listhesis is present. Vertebrae: L1 burst fracture is again noted. Retropulsed bone and epidural hematoma result in central canal stenosis at the L1 level. This is at the tip of the conus medullaris. A more subtle superior endplate fractures present at L3. There is some edema within the upper half of the vertebral body. Minimally displaced superior endplate compression fracture is more evident right than left. Marrow signal and vertebral body heights are preserved at L2, L4, and L5. Conus medullaris and cauda equina: Conus extends to the L1 level. Conus and cauda equina appear normal. Paraspinal and other soft tissues: There is a thin paraspinal hematoma no impact is present at L1. On the retroperitoneum. Visualized intra-abdominal organs are within normal limits. Paraspinous musculature is unremarkable. Disc levels: Moderate to severe central canal stenosis is present at L1 secondary to retropulsion of bone fragments and  epidural hematoma. The epidural hematoma extends to the L2 vertebral body. L1-2: Moderate left subarticular narrowing is secondary to the epidural hematoma. Foramina are patent. L2-3: Mild broad-based disc bulge is present. No significant stenosis is present. L3-4: There is desiccation of the disc consent loss of height without significant stenosis. L4-5: Mild disc bulging is present at. A far right annular tear is noted. There is no significant stenosis. L5-S1: Negative. IMPRESSION: 1. L1 burst fracture with retropulsion of bone fragments and epidural hematoma resulting in moderate to severe central canal stenosis at L1. 2. Minimally displaced acute superior endplate compression fracture at L3 without retropulsed bone or significant stenosis. 3. Moderate left subarticular stenosis at L1-2 secondary to the epidural hematoma. 4. Mild disc bulging at L2-3 and L3-4 without significant stenosis at either level. Electronically Signed   By: Marin Roberts M.D.   On: 10/15/2019 20:40   Ct Abdomen Pelvis W Contrast  Result Date: 10/15/2019 CLINICAL DATA:  Larey Seat off ladder today. EXAM: CT CHEST, ABDOMEN, AND PELVIS WITH CONTRAST TECHNIQUE: Multidetector CT imaging of the chest, abdomen and pelvis was performed following the standard protocol during bolus administration of intravenous contrast. CONTRAST:  OMNIPAQUE IOHEXOL 300 MG/ML  SOLN COMPARISON:  None. FINDINGS: CT CHEST FINDINGS Cardiovascular: The heart is normal in size. No pericardial effusion. Mild tortuosity, ectasia and calcification of the thoracic aorta but no aneurysm or dissection. The branch vessels are patent. Scattered coronary artery calcifications are noted. Mediastinum/Nodes: No mediastinal  or hilar mass or lymphadenopathy. No mediastinal hematoma. The esophagus is grossly normal. Lungs/Pleura: No acute pulmonary findings. No pulmonary contusions or pneumothorax. Dependent bibasilar subpleural atelectasis. No pleural effusion. No  worrisome pulmonary lesions. A few small pulmonary nodules are likely benign. Mild bronchiectasis is noted. No interstitial lung disease. Musculoskeletal: No chest wall mass, supraclavicular or axillary adenopathy. The thoracic vertebral bodies are normally aligned. No acute fracture. The sternum is intact. CT ABDOMEN PELVIS FINDINGS Hepatobiliary: No focal hepatic lesions or acute hepatic injury. No perihepatic fluid collections. The gallbladder is normal. No common bile duct dilatation. Pancreas: No mass, inflammation or ductal dilatation. No acute injury or peripancreatic fluid collection. Spleen: Normal size. No acute injury or perisplenic fluid collection. Adrenals/Urinary Tract: The adrenal glands and kidneys are unremarkable. No acute renal injury or perinephric fluid collection. The bladder is unremarkable. Stomach/Bowel: The stomach, duodenum, small bowel and colon are grossly normal without oral contrast. No obvious acute inflammatory process, mass lesion or obstructive findings. No free air is identified. Vascular/Lymphatic: Moderate atherosclerotic calcifications involving the distal aorta and iliac arteries. No aneurysm or dissection. The major venous structures are patent. No mesenteric or retroperitoneal mass, adenopathy or hematoma. Reproductive: The prostate gland and seminal vesicles are unremarkable. Other: No free pelvic fluid collections, pelvic hematoma or inguinal mass. Musculoskeletal: There is a significant burst type fracture of L1 with retropulsion of the posterosuperior aspect of the vertebral body into the spinal canal. There is approximately 50-60% canal compromise. The pedicles are intact but there is a fracture extending through the left lamina and the left facet joint and left transverse process. I do not see any definite fractures above or below this level. Paraspinal hematoma is noted along with some blood extending up into the retrocrural space. The posterior ribs are intact.  IMPRESSION: 1. Significant burst type fracture of L1 with retropulsion and spinal canal compromise. 2. There is also involvement of the posterior elements on the left side. 3. No acute intra-abdominal/intrapelvic injury is identified. The solid abdominal organs are intact and no definite findings for bowel injury. 4. Bibasilar atelectasis but no acute lung injury. 5. Normal appearance of the heart and great vessels. These results were called by telephone at the time of interpretation on 10/15/2019 at 4:57 pm [facility] , who verbally acknowledged these results. Electronically Signed   By: Rudie MeyerP.  Gallerani M.D.   On: 10/15/2019 16:58   Dg Pelvis Portable  Result Date: 10/15/2019 CLINICAL DATA:  Fall off ladder from 12 ft height. Pelvic pain. Initial encounter. EXAM: PORTABLE PELVIS 1-2 VIEWS COMPARISON:  None. FINDINGS: There is no evidence of pelvic fracture or diastasis. No pelvic bone lesions are seen. IMPRESSION: Negative. Electronically Signed   By: Danae OrleansJohn A Stahl M.D.   On: 10/15/2019 16:18   Mr Shoulder Left Wo Contrast  Result Date: 10/17/2019 CLINICAL DATA:  In left shoulder dislocation after falling off a ladder. EXAM: MRI OF THE LEFT SHOULDER WITHOUT CONTRAST TECHNIQUE: Multiplanar, multisequence MR imaging of the shoulder was performed. No intravenous contrast was administered. COMPARISON:  Left shoulder x-rays dated October 15, 2019. MRI left shoulder dated February 06, 2011. FINDINGS: Rotator cuff:  Intact rotator cuff. Muscles: Moderate edema along the subscapularis muscle. No muscle atrophy. Biceps long head:  Intact and normally positioned. Acromioclavicular Joint: Mild arthropathy of the acromioclavicular joint. Type I acromion. Small amount of fluid in the subacromial/subdeltoid bursa. Glenohumeral Joint: Small to moderate hemarthrosis. No chondral defect. Labrum: Large bony Bankart lesion involving the anterior inferior labrum/glenoid  with the displaced fragment in the  subscapularis recess (series 5, image 13). Remaining labrum is grossly intact. Bones: Bony Bankart lesion as above. Large impaction fracture of the posterosuperior humeral head. No dislocation. No suspicious bone lesion. Other: Irregularity of the anterior inferior glenohumeral ligament at its humeral attachment. IMPRESSION: 1. Sequelae of recent anterior shoulder dislocation with large Hill-Sachs fracture deformity and large bony Bankart lesion involving the anterior inferior labrum/glenoid. The displaced fragment is in the subscapularis recess. 2. Suspected partial tear of the anterior inferior glenohumeral ligament near the humeral attachment. 3. Intact rotator cuff.  Moderate subscapularis muscle strain. 4. Small to moderate hemarthrosis. Electronically Signed   By: Obie Dredge M.D.   On: 10/17/2019 08:25   Ct Ankle Left Wo Contrast  Result Date: 10/17/2019 CLINICAL DATA:  Ankle fracture EXAM: CT OF THE LEFT ANKLE WITHOUT CONTRAST TECHNIQUE: Multidetector CT imaging of the left ankle was performed according to the standard protocol. Multiplanar CT image reconstructions were also generated. COMPARISON:  X-ray 10/15/2019 FINDINGS: Bones/Joint/Cartilage Redemonstration of trimalleolar fracture the left ankle. Vertical fracture component of the medial aspect the distal tibial metaphysis results in minimal displacement of the medial malleolar component and up to 8 mm articular-surface diastasis at the anteromedial aspect of the tibial plafond and (series 6, image 29). Mildly displaced posterior malleolar fracture (series 10, image 40). Slight varus angulation at distal fibular metaphyseal fracture (series 9, image 72). Additional tiny avulsion fracture from the inferior tip of the lateral malleolus (series 9, image 69) as well as from the adjacent lateral cortex of the talus (series 9, image 66). There is varus tilt of the talus relative to the tibial plafond without dislocation. Multiple small comminuted  fracture fragments interposed at the tibiotalar joint. Small nondisplaced fracture at the anterolateral margin of the calcaneus (series 6, image 60). Alignment of the subtalar joint is maintained. Osseous structures of the midfoot appear intact without fracture or malalignment. Ligaments Suboptimally assessed by CT. Muscles and Tendons Peroneal tendons closely approximate the lateral malleolar fracture site without definite entrapment. No obvious tendinous discontinuity. Muscle bulk unremarkable. Soft tissues Circumferential soft tissue swelling and hemorrhage at the fracture sites. IMPRESSION: 1. Trimalleolar fracture of the left ankle with varus tilt of the talus relative to the tibial plafond without dislocation. 2. Additional tiny avulsion fracture from the inferior tip of the lateral malleolus as well as from the adjacent lateral cortex of the talus. 3. Small nondisplaced fracture at the anterolateral margin of the calcaneus. 4. Peroneal tendons closely approximate the lateral malleolar fracture site without definite entrapment. Electronically Signed   By: Duanne Guess M.D.   On: 10/17/2019 08:20   Dg Chest Port 1 View  Result Date: 10/15/2019 CLINICAL DATA:  Fall off ladder from 12 ft height. Chest pain. Left shoulder dislocation. Initial encounter. EXAM: PORTABLE CHEST 1 VIEW COMPARISON:  None. FINDINGS: The heart size and mediastinal contours are within normal limits. No evidence of pneumothorax or hemothorax. Both lungs are clear. Left shoulder dislocation noted. Cervical spine fusion hardware is seen. IMPRESSION: No active cardiopulmonary disease. Left shoulder dislocation. Electronically Signed   By: Danae Orleans M.D.   On: 10/15/2019 16:20   Dg Shoulder Left Portable  Result Date: 10/15/2019 CLINICAL DATA:  Post reduction images. EXAM: LEFT SHOULDER - 1 VIEW COMPARISON:  Pre reduction images, 10/15/2019 at 3:49 p.m. FINDINGS: Humeral head has been realigned with the glenoid. No fracture is  seen. IMPRESSION: Successful reduction of the left shoulder dislocation. Electronically Signed   By:  Amie Portland M.D.   On: 10/15/2019 18:06   Dg Shoulder Left Portable  Result Date: 10/15/2019 CLINICAL DATA:  Fall off ladder from 12 ft height. Left shoulder pain. Initial encounter. EXAM: LEFT SHOULDER - 1 VIEW COMPARISON:  None. FINDINGS: Anterior dislocation of the humeral head is seen. No fracture or other bone lesions identified. IMPRESSION: Anterior shoulder dislocation.  No evidence of fracture. Electronically Signed   By: Danae Orleans M.D.   On: 10/15/2019 16:19   Dg C-arm 1-60 Min  Result Date: 10/16/2019 CLINICAL DATA:  T10 through L4 PLIF EXAM: DG C-ARM 1-60 MIN; THORACOLUMBAR SPINE - 2 VIEW FLUOROSCOPY TIME:  Fluoroscopy Time:  2 minutes and 11 seconds COMPARISON:  None. FINDINGS: Six intraoperative fluoroscopic images are provided showing posterior inter pedicular fixation hardware traversing the thoracolumbar junction as indicated by the given clinical data. Hardware appears intact and appropriately positioned. Fluoroscopy was provided for 2 minutes and 11 seconds. IMPRESSION: Intraoperative fluoroscopic images demonstrating posterior interpedicular fixation hardware traversing the thoracolumbar junction. No evidence of surgical complicating feature. Electronically Signed   By: Bary Richard M.D.   On: 10/16/2019 15:47    Anti-infectives: Anti-infectives (From admission, onward)   Start     Dose/Rate Route Frequency Ordered Stop   10/16/19 2130  vancomycin (VANCOCIN) IVPB 1000 mg/200 mL premix  Status:  Discontinued     1,000 mg 200 mL/hr over 60 Minutes Intravenous  Once 10/16/19 1704 10/16/19 1737   10/16/19 2130  vancomycin (VANCOCIN) IVPB 1000 mg/200 mL premix     1,000 mg 200 mL/hr over 60 Minutes Intravenous Every 12 hours 10/16/19 1737     10/16/19 1342  vancomycin (VANCOCIN) powder  Status:  Discontinued       As needed 10/16/19 1342 10/16/19 1431   10/16/19 1049   bacitracin 50,000 Units in sodium chloride 0.9 % 500 mL irrigation  Status:  Discontinued       As needed 10/16/19 1049 10/16/19 1431   10/16/19 0922  vancomycin (VANCOCIN) 1-5 GM/200ML-% IVPB    Note to Pharmacy: Launa Flight   : cabinet override      10/16/19 0922 10/16/19 1740       Assessment/Plan Fall from ladder L 1 burst fracture - fusion 11/1 Yetta Barre), now with peroneal numbness and urinary retention.  Will place foley today.  On decadron per neuro L anterior shoulder dislocation - may have just dislocated again.  MRI with significant injury to this shoulder.  Xray pending to confirm dislocation or current position of joint.  Have asked ortho PA to come reassess this in acute setting and will await Dr. Shelba Flake decision later on management of MRI findings. L ankle trimalleolar fracture - CT completed and confirms this fracture along with several other avulsion type fractions. Without dislocation at this time.  Await further management decisions. FEN - regular diet, IVFs, foley being placed for urinary retention, check labs today VTE - SCD, will D/W NS about when lovenox can be started ID - vanc for back   LOS: 2 days    Letha Cape , [facility] 10/17/2019, 8:40 AM Please see Amion for pager number during day hours 7:00am-4:30pm

## 2019-10-17 NOTE — Plan of Care (Signed)
  Problem: Education: Goal: Ability to verbalize activity precautions or restrictions will improve Outcome: Progressing   Problem: Activity: Goal: Ability to avoid complications of mobility impairment will improve Outcome: Progressing Goal: Will remain free from falls Outcome: Progressing   Problem: Education: Goal: Knowledge of General Education information will improve Description: Including pain rating scale, medication(s)/side effects and non-pharmacologic comfort measures Outcome: Progressing   Problem: Clinical Measurements: Goal: Respiratory complications will improve Outcome: Progressing   Problem: Nutrition: Goal: Adequate nutrition will be maintained Outcome: Progressing   Problem: Elimination: Goal: Will not experience complications related to urinary retention Outcome: Progressing   Problem: Pain Managment: Goal: General experience of comfort will improve Outcome: Progressing   Problem: Safety: Goal: Ability to remain free from injury will improve Outcome: Progressing   Problem: Skin Integrity: Goal: Risk for impaired skin integrity will decrease Outcome: Progressing

## 2019-10-17 NOTE — Progress Notes (Signed)
PT Cancellation Note  Patient Details Name: Balen Woolum MRN: [id number] DOB: 04-Dec-1956   Cancelled Treatment:    Reason Eval/Treat Not Completed: Patient not medically ready   Patient not medically ready. Possible shoulder dislocation again this AM and awaiting ortho. Ankle to be assessed further as well.  Will follow,   Roney Marion, PT  Acute Rehabilitation Services Pager [phone] Office [phone]    Colletta [place] 10/17/2019, 10:25 AM

## 2019-10-17 NOTE — Anesthesia Preprocedure Evaluation (Deleted)
Anesthesia Evaluation    Reviewed: Allergy & Precautions, Patient's Chart, lab work & pertinent test results  History of Anesthesia Complications Negative for: history of anesthetic complications  Airway        Dental   Pulmonary neg pulmonary ROS,           Cardiovascular negative cardio ROS       Neuro/Psych negative neurological ROS  negative psych ROS   GI/Hepatic negative GI ROS, Neg liver ROS,   Endo/Other  negative endocrine ROS  Renal/GU negative Renal ROS     Musculoskeletal negative musculoskeletal ROS (+)   Abdominal   Peds  Hematology  (+) anemia ,   Anesthesia Other Findings L 1 burst fracture - fusion 11/1 Ronnald Ramp), now with peroneal numbness and urinary retention. L ankle trimalleolar fracture   Reproductive/Obstetrics                             Anesthesia Physical Anesthesia Plan  ASA: II  Anesthesia Plan: General   Post-op Pain Management:  Regional for Post-op pain   Induction: Intravenous  PONV Risk Score and Plan: 3 and Treatment may vary due to age or medical condition and Ondansetron  Airway Management Planned: LMA  Additional Equipment: None  Intra-op Plan:   Post-operative Plan: Extubation in OR  Informed Consent:   Plan Discussed with: CRNA and Anesthesiologist  Anesthesia Plan Comments:         Anesthesia Quick Evaluation

## 2019-10-18 ENCOUNTER — Encounter (HOSPITAL_COMMUNITY): Payer: Self-pay | Admitting: General Practice

## 2019-10-18 ENCOUNTER — Encounter (HOSPITAL_COMMUNITY): Admission: EM | Disposition: A | Discharge status: Rehabilitation Facility | Payer: Self-pay | Source: Home / Self Care

## 2019-10-18 LAB — BASIC METABOLIC PANEL
Anion gap: 6 (ref 5–15)
BUN: 16 mg/dL (ref 8–23)
CO2: 29 mmol/L (ref 22–32)
Calcium: 8.5 mg/dL — ABNORMAL LOW (ref 8.9–10.3)
Chloride: 105 mmol/L (ref 98–111)
Creatinine, Ser: 0.78 mg/dL (ref 0.61–1.24)
GFR calc Af Amer: >60 mL/min (ref >60)
GFR calc non Af Amer: >60 mL/min (ref >60)
Glucose, Bld: 118 mg/dL — ABNORMAL HIGH (ref 70–99)
Potassium: 4.2 mmol/L (ref 3.5–5.1)
Sodium: 140 mmol/L (ref 135–145)

## 2019-10-18 LAB — PHOSPHORUS: Phosphorus: 3.5 mg/dL (ref 2.5–4.6)

## 2019-10-18 LAB — CBC
HCT: 28.2 % — ABNORMAL LOW (ref 39.0–52.0)
Hemoglobin: 9.4 g/dL — ABNORMAL LOW (ref 13.0–17.0)
MCH: 31.6 pg (ref 26.0–34.0)
MCHC: 33.3 g/dL (ref 30.0–36.0)
MCV: 94.9 fL (ref 80.0–100.0)
Platelets: 186 10*3/uL (ref 150–400)
RBC: 2.97 MIL/uL — ABNORMAL LOW (ref 4.22–5.81)
RDW: 12.8 % (ref 11.5–15.5)
WBC: 9.5 10*3/uL (ref 4.0–10.5)
nRBC: 0 % (ref 0.0–0.2)

## 2019-10-18 LAB — MAGNESIUM: Magnesium: 2.1 mg/dL (ref 1.7–2.4)

## 2019-10-18 SURGERY — OPEN REDUCTION INTERNAL FIXATION (ORIF) ANKLE FRACTURE
Anesthesia: General | Site: Ankle | Laterality: Left

## 2019-10-18 NOTE — Progress Notes (Deleted)
Patients dressing came off. This nurse applied two 2x2 and tape. Clean dry and intact no new drainage site looks clean

## 2019-10-18 NOTE — Plan of Care (Signed)
  Problem: Education: Goal: Ability to verbalize activity precautions or restrictions will improve Outcome: Progressing Goal: Knowledge of the prescribed therapeutic regimen will improve Outcome: Progressing   Problem: Activity: Goal: Ability to tolerate increased activity will improve Outcome: Progressing   Problem: Clinical Measurements: Goal: Ability to maintain clinical measurements within normal limits will improve Outcome: Progressing   Problem: Pain Management: Goal: Pain level will decrease Outcome: Progressing   Problem: Skin Integrity: Goal: Will show signs of wound healing Outcome: Progressing

## 2019-10-18 NOTE — Progress Notes (Signed)
Doing well, some feeling back in groin, no leg pain or NTW, c/o L ankle pain. For ortho surgery tomorrow.

## 2019-10-18 NOTE — Progress Notes (Addendum)
2 Days Post-Op  Subjective: CC: Patient complaining of left ankle feeling swollen and toes feeling numb. He feels that his brace may be too tight. He reports he is tolerating his diet. Last BM 10/31. Ortho planning OR tomorrow.   Objective: Vital signs in last 24 hours: Temp:  [97.7 F (36.5 C)-98.7 F (37.1 C)] 97.7 F (36.5 C) (11/03 0439) Pulse Rate:  [60-66] 60 (11/03 0439) Resp:  [17-18] 17 (11/03 0439) BP: (130-147)/(73-79) 147/79 (11/03 0439) SpO2:  [95 %-99 %] 95 % (11/03 0439) Last BM Date: 10/15/19  Intake/Output from previous day: 11/02 0701 - 11/03 0700 In: 720 [P.O.:720] Out: 4045 [Urine:3900; Drains:145] Intake/Output this shift: No intake/output data recorded.  PE: Gen: NAD Heart: regular Lungs: CTAB Abd: soft, ND, NT, +BS Ext: left ankle in splint, good cap refill, wiggles toes. Calf and compartments just below knee are soft. He does report decreased sensation of left toes compared to right.  Back: Davol drain in place with bloody output, but minimal currently. 145cc/24 hours recorded Psych: A&O x3  Lab Results:  Recent Labs    10/17/19 1024 10/18/19 0412  WBC 12.2* 9.5  HGB 10.0* 9.4*  HCT 29.9* 28.2*  PLT 195 186   BMET Recent Labs    10/17/19 1024 10/18/19 0412  NA 136 140  K 4.4 4.2  CL 101 105  CO2 29 29  GLUCOSE 159* 118*  BUN 16 16  CREATININE 0.93 0.78  CALCIUM 8.2* 8.5*   PT/INR Recent Labs    10/15/19 1556  LABPROT 14.1  INR 1.1   CMP     Component Value Date/Time   NA 140 10/18/2019 0412   K 4.2 10/18/2019 0412   CL 105 10/18/2019 0412   CO2 29 10/18/2019 0412   GLUCOSE 118 (H) 10/18/2019 0412   BUN 16 10/18/2019 0412   CREATININE 0.78 10/18/2019 0412   CALCIUM 8.5 (L) 10/18/2019 0412   PROT 6.1 (L) 10/15/2019 1556   ALBUMIN 3.9 10/15/2019 1556   AST 36 10/15/2019 1556   ALT 28 10/15/2019 1556   ALKPHOS 53 10/15/2019 1556   BILITOT 0.6 10/15/2019 1556   GFRNONAA >60 10/18/2019 0412   GFRAA >60  10/18/2019 0412   Lipase  No results found for: LIPASE     Studies/Results: Dg Thoracolumabar Spine  Result Date: 10/16/2019 CLINICAL DATA:  T10 through L4 PLIF EXAM: DG C-ARM 1-60 MIN; THORACOLUMBAR SPINE - 2 VIEW FLUOROSCOPY TIME:  Fluoroscopy Time:  2 minutes and 11 seconds COMPARISON:  None. FINDINGS: Six intraoperative fluoroscopic images are provided showing posterior inter pedicular fixation hardware traversing the thoracolumbar junction as indicated by the given clinical data. Hardware appears intact and appropriately positioned. Fluoroscopy was provided for 2 minutes and 11 seconds. IMPRESSION: Intraoperative fluoroscopic images demonstrating posterior interpedicular fixation hardware traversing the thoracolumbar junction. No evidence of surgical complicating feature. Electronically Signed   By: Bary Richard M.D.   On: 10/16/2019 15:47   Ct Shoulder Left Wo Contrast  Result Date: 10/17/2019 CLINICAL DATA:  Left shoulder dislocation after falling off a ladder. EXAM: CT OF THE UPPER LEFT EXTREMITY WITHOUT CONTRAST TECHNIQUE: Multidetector CT imaging of the upper left extremity was performed according to the standard protocol. COMPARISON:  MRI left shoulder from same day. FINDINGS: Bones/Joint/Cartilage Again seen is a large bony Bankart lesion involving the anterior and anterior inferior glenoid with two large displaced fragments in the subscapularis and anterior axillary recesses. The fracture involves approximately 25% of the glenoid surface. Again seen is  a large impaction fracture of the posterosuperior humeral head. No dislocation. Small hemarthrosis. Mild acromioclavicular osteoarthritis. Prior C5-C7 ACDF with solid fusion. Ligaments Suboptimally assessed by CT. Muscles and Tendons Grossly intact.  No muscle atrophy. Soft tissues New small left pleural effusion. Small amount of hemorrhage in fluid in the left axilla. IMPRESSION: 1. 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 2. Large Hill-Sachs deformity. 3. New small left pleural effusion. Electronically Signed   By: Titus Dubin M.D.   On: 10/17/2019 16:06   Mr Shoulder Left Wo Contrast  Result Date: 10/17/2019 CLINICAL DATA:  In left shoulder dislocation after falling off a ladder. EXAM: MRI OF THE LEFT SHOULDER WITHOUT CONTRAST TECHNIQUE: Multiplanar, multisequence MR imaging of the shoulder was performed. No intravenous contrast was administered. COMPARISON:  Left shoulder x-rays dated October 15, 2019. MRI left shoulder dated February 06, 2011. FINDINGS: Rotator cuff:  Intact rotator cuff. Muscles: Moderate edema along the subscapularis muscle. No muscle atrophy. Biceps long head:  Intact and normally positioned. Acromioclavicular Joint: Mild arthropathy of the acromioclavicular joint. Type I acromion. Small amount of fluid in the subacromial/subdeltoid bursa. Glenohumeral Joint: Small to moderate hemarthrosis. No chondral defect. Labrum: Large bony Bankart lesion involving the anterior inferior labrum/glenoid with the displaced fragment in the subscapularis recess (series 5, image 13). Remaining labrum is grossly intact. Bones: Bony Bankart lesion as above. Large impaction fracture of the posterosuperior humeral head. No dislocation. No suspicious bone lesion. Other: Irregularity of the anterior inferior glenohumeral ligament at its humeral attachment. IMPRESSION: 1. Sequelae of recent anterior shoulder dislocation with large Hill-Sachs fracture deformity and large bony Bankart lesion involving the anterior inferior labrum/glenoid. The displaced fragment is in the subscapularis recess. 2. Suspected partial tear of the anterior inferior glenohumeral ligament near the humeral attachment. 3. Intact rotator cuff.  Moderate subscapularis muscle strain. 4. Small to moderate hemarthrosis. Electronically Signed   By: Titus Dubin M.D.   On: 10/17/2019 08:25    Ct Ankle Left Wo Contrast  Result Date: 10/17/2019 CLINICAL DATA:  Ankle fracture EXAM: CT OF THE LEFT ANKLE WITHOUT CONTRAST TECHNIQUE: Multidetector CT imaging of the left ankle was performed according to the standard protocol. Multiplanar CT image reconstructions were also generated. COMPARISON:  X-ray 10/15/2019 FINDINGS: Bones/Joint/Cartilage Redemonstration of trimalleolar fracture the left ankle. Vertical fracture component of the medial aspect the distal tibial metaphysis results in minimal displacement of the medial malleolar component and up to 8 mm articular-surface diastasis at the anteromedial aspect of the tibial plafond and (series 6, image 29). Mildly displaced posterior malleolar fracture (series 10, image 40). Slight varus angulation at distal fibular metaphyseal fracture (series 9, image 72). Additional tiny avulsion fracture from the inferior tip of the lateral malleolus (series 9, image 69) as well as from the adjacent lateral cortex of the talus (series 9, image 66). There is varus tilt of the talus relative to the tibial plafond without dislocation. Multiple small comminuted fracture fragments interposed at the tibiotalar joint. Small nondisplaced fracture at the anterolateral margin of the calcaneus (series 6, image 60). Alignment of the subtalar joint is maintained. Osseous structures of the midfoot appear intact without fracture or malalignment. Ligaments Suboptimally assessed by CT. Muscles and Tendons Peroneal tendons closely approximate the lateral malleolar fracture site without definite entrapment. No obvious tendinous discontinuity. Muscle bulk unremarkable. Soft tissues Circumferential soft tissue swelling and hemorrhage at the fracture sites. IMPRESSION: 1. Trimalleolar fracture of the left ankle  with varus tilt of the talus relative to the tibial plafond without dislocation. 2. Additional tiny avulsion fracture from the inferior tip of the lateral malleolus as well as from  the adjacent lateral cortex of the talus. 3. Small nondisplaced fracture at the anterolateral margin of the calcaneus. 4. Peroneal tendons closely approximate the lateral malleolar fracture site without definite entrapment. Electronically Signed   By: Duanne Guess M.D.   On: 10/17/2019 08:20   Dg Shoulder Left Port  Result Date: 10/17/2019 CLINICAL DATA:  Shoulder dislocation EXAM: LEFT SHOULDER - 1 VIEW COMPARISON:  Earlier same day FINDINGS: There is now anatomic alignment at the left glenohumeral joint. No acute fracture. No acute findings. IMPRESSION: Post reduction of left shoulder dislocation with anatomic alignment. Electronically Signed   By: Guadlupe Spanish M.D.   On: 10/17/2019 14:45   Dg Shoulder Left Port  Result Date: 10/17/2019 CLINICAL DATA:  Fall, dislocation EXAM: LEFT SHOULDER - 1 VIEW COMPARISON:  October 15, 2019 FINDINGS: The humeral head is anterior and inferior to its expected location. No acute fracture. Partially imaged cervical and thoracic fixation hardware. IMPRESSION: Left anterior shoulder dislocation. Electronically Signed   By: Guadlupe Spanish M.D.   On: 10/17/2019 10:48   Dg C-arm 1-60 Min  Result Date: 10/16/2019 CLINICAL DATA:  T10 through L4 PLIF EXAM: DG C-ARM 1-60 MIN; THORACOLUMBAR SPINE - 2 VIEW FLUOROSCOPY TIME:  Fluoroscopy Time:  2 minutes and 11 seconds COMPARISON:  None. FINDINGS: Six intraoperative fluoroscopic images are provided showing posterior inter pedicular fixation hardware traversing the thoracolumbar junction as indicated by the given clinical data. Hardware appears intact and appropriately positioned. Fluoroscopy was provided for 2 minutes and 11 seconds. IMPRESSION: Intraoperative fluoroscopic images demonstrating posterior interpedicular fixation hardware traversing the thoracolumbar junction. No evidence of surgical complicating feature. Electronically Signed   By: Bary Richard M.D.   On: 10/16/2019 15:47     Anti-infectives: Anti-infectives (From admission, onward)   Start     Dose/Rate Route Frequency Ordered Stop   10/16/19 2130  vancomycin (VANCOCIN) IVPB 1000 mg/200 mL premix  Status:  Discontinued     1,000 mg 200 mL/hr over 60 Minutes Intravenous  Once 10/16/19 1704 10/16/19 1737   10/16/19 2130  vancomycin (VANCOCIN) IVPB 1000 mg/200 mL premix  Status:  Discontinued     1,000 mg 200 mL/hr over 60 Minutes Intravenous Every 12 hours 10/16/19 1737 10/17/19 2021   10/16/19 1342  vancomycin (VANCOCIN) powder  Status:  Discontinued       As needed 10/16/19 1342 10/16/19 1431   10/16/19 1049  bacitracin 50,000 Units in sodium chloride 0.9 % 500 mL irrigation  Status:  Discontinued       As needed 10/16/19 1049 10/16/19 1431   10/16/19 0922  vancomycin (VANCOCIN) 1-5 GM/200ML-% IVPB    Note to Pharmacy: Launa Flight   : cabinet override      10/16/19 0922 10/16/19 1740       Assessment/Plan Fall from ladder L 1 burst fracture - fusion 11/1 Yetta Barre), now with peroneal numbness and urinary retention. Foley in place. On decadron per neuro L anterior shoulder dislocation - Had repeat dislocation yesterday s/p closed reduction. MRI with significant injury to this shoulder. CT done yesterday for surgical planning. Dr. Everardo Pacific planning OR tomorrow.  L ankle trimalleolar fracture - CT confirms fracture along with several other avulsion type fractions. Dr. Jena Gauss planning OR tomorrow. Have paged Ortho tech to loosen splint. Compartments currently soft above splint with good cap refill  of all digits.  FEN - Reg diet, NPO at midnight for OR with Ortho tomorrow.  Foley - In place for urinary retention VTE - SCD, will d/w NS about when lovenox can be started ID - van 11/1-11/2 for back  Dispo: OR with Ortho tomorrow. PT/OT after.    LOS: 3 days    Jacinto HalimMichael M Joeanthony Seeling , [facility] 10/18/2019, 10:17 AM Please see Amion for pager number during day hours 7:00am-4:30pm

## 2019-10-18 NOTE — Evaluation (Signed)
Occupational Therapy Evaluation Patient Details Name: Louis Jordan MRN: [id number] DOB: 12-29-1955 Today's Date: 10/18/2019    History of Present Illness Admitted after fall from ladder resulting in L1 burst fracture, now s/p lumbar surgery for fixation (clamshell brace, may don sitting), L ankle comminuted fracture, and L shoulder dislocation; Lumbar surgery 11/1; as of 11/2 has not had surgery on the L ankle yet, and per Ortho consult, await further assessment of L shoulder   Clinical Impression   Pt with decline in function and safety with ADLs and ADL mobility with impaired strength, balance and endurance. Pt reports that PTA, he lived at home with his wife and was independent with ADLs/selfcare and mobility. Pt currently requires mod A +2 for bed mobility, max - total A with LB ADLs and was unable to attempt sit - stand/transfers today. Educated pt and his wife on precautions, and positioning. Pt will benefit from acute OT services to address impairments to maximize level of function and safety      Follow Up Recommendations  CIR    Equipment Recommendations  3 in 1 bedside commode;Tub/shower seat;Other (comment)(ADL A/E kit, TBD at next venue of care)    Recommendations for Other Services Rehab consult     Precautions / Restrictions Precautions Precautions: Fall;Back Precaution Comments: L shoulder dislocation - maintain sling Required Braces or Orthoses: Sling;Spinal Brace Spinal Brace: Thoracolumbosacral orthotic Restrictions Weight Bearing Restrictions: Yes LLE Weight Bearing: Non weight bearing      Mobility Bed Mobility Overal bed mobility: Needs Assistance Bed Mobility: Rolling;Supine to Sit;Sit to Supine Rolling: Min assist   Supine to sit: Mod assist;+2 for physical assistance Sit to supine: Mod assist;+2 for physical assistance   General bed mobility comments: Pt performed rolling this session in each direction x 2 with min assist for LE management and cues for  log roll technique to maintain back precautions. Pt performed supine<>sitting EOB this session with mod assist +2 to assist with trunk elevation/control and second therapist assisting with LE management and lines, cues throughout for back precautions and techniques.  Transfers Overall transfer level: (unable this session secondary to pain and nausea)               General transfer comment: not attempted    Balance Overall balance assessment: Needs assistance Sitting-balance support: Feet supported Sitting balance-Leahy Scale: Poor Sitting balance - Comments: CGA for sitting balance EOB                                   ADL either performed or assessed with clinical judgement   ADL Overall ADL's : Needs assistance/impaired Eating/Feeding: Independent;Sitting;Bed level   Grooming: Wash/dry hands;Wash/dry face;Min guard;Sitting   Upper Body Bathing: Min guard;Sitting   Lower Body Bathing: Maximal assistance   Upper Body Dressing : Min guard;Cueing for safety   Lower Body Dressing: Total assistance     Toilet Transfer Details (indicate cue type and reason): not attempted Toileting- Clothing Manipulation and Hygiene: Total assistance;Bed level               Vision Patient Visual Report: No change from baseline       Perception     Praxis      Pertinent Vitals/Pain Pain Assessment: 0-10 Pain Score: 6  Pain Location: back Pain Descriptors / Indicators: Constant;Pressure;Grimacing;Guarding Pain Intervention(s): Limited activity within patient's tolerance;Monitored during session;Premedicated before session;Repositioned     Hand Dominance Right  Extremity/Trunk Assessment Upper Extremity Assessment Upper Extremity Assessment: Overall WFL for tasks assessed   Lower Extremity Assessment Lower Extremity Assessment: Defer to PT evaluation   Cervical / Trunk Assessment Cervical / Trunk Assessment: Other exceptions Cervical / Trunk Exceptions:  spinal fx, surgery   Communication Communication Communication: No difficulties   Cognition Arousal/Alertness: Awake/alert Behavior During Therapy: WFL for tasks assessed/performed Overall Cognitive Status: Within Functional Limits for tasks assessed                                     General Comments       Exercises     Shoulder Instructions      Home Living Family/patient expects to be discharged to:: Private residence Living Arrangements: Spouse/significant other Available Help at Discharge: Family;Available 24 hours/day Type of Home: House Home Access: Stairs to enter     Home Layout: One level     Bathroom Shower/Tub: Occupational psychologist: Standard     Home Equipment: None          Prior Functioning/Environment Level of Independence: Independent                 OT Problem List: Impaired balance (sitting and/or standing);Pain;Decreased knowledge of precautions;Decreased activity tolerance;Decreased knowledge of use of DME or AE      OT Treatment/Interventions: Self-care/ADL training;DME and/or AE instruction;Therapeutic activities;Patient/family education    OT Goals(Current goals can be found in the care plan section) Acute Rehab OT Goals Patient Stated Goal: "move the right way" OT Goal Formulation: With patient/family Time For Goal Achievement: 11/01/19 Potential to Achieve Goals: Good ADL Goals Pt Will Perform Grooming: with supervision;with set-up;sitting;with caregiver independent in assisting Pt Will Perform Upper Body Bathing: with supervision;with set-up;sitting;with caregiver independent in assisting Pt Will Perform Lower Body Bathing: with mod assist;with adaptive equipment;sitting/lateral leans;with caregiver independent in assisting Pt Will Perform Upper Body Dressing: with supervision;with set-up;sitting;with caregiver independent in assisting Pt Will Transfer to Toilet: with mod assist;with min  assist;ambulating;stand pivot transfer;bedside commode Additional ADL Goal #1: pt will complete bed mobility min A using proper technique to sit EOB in prep to don back brace and for selfcare tasks  OT Frequency: Min 2X/week   Barriers to D/C:    no barriers       Co-evaluation PT/OT/SLP Co-Evaluation/Treatment: Yes Reason for Co-Treatment: Complexity of the patient's impairments (multi-system involvement);For patient/therapist safety;To address functional/ADL transfers   OT goals addressed during session: ADL's and self-care      AM-PAC OT "6 Clicks" Daily Activity     Outcome Measure Help from another person eating meals?: None Help from another person taking care of personal grooming?: A Little Help from another person toileting, which includes using toliet, bedpan, or urinal?: Total Help from another person bathing (including washing, rinsing, drying)?: Total Help from another person to put on and taking off regular upper body clothing?: A Little Help from another person to put on and taking off regular lower body clothing?: Total 6 Click Score: 13   End of Session Nurse Communication: Mobility status  Activity Tolerance: Patient limited by pain;Patient limited by fatigue Patient left: in chair;with call bell/phone within reach;with family/visitor present  OT Visit Diagnosis: Other abnormalities of gait and mobility (R26.89);Muscle weakness (generalized) (M62.81);History of falling (Z91.81);Pain Pain - Right/Left: Left(bilaterally and back) Pain - part of body: Leg;Ankle and joints of foot;Shoulder(L shoulder, L LE, back)  Time: 2376-2831 OT Time Calculation (min): 43 min Charges:  OT General Charges $OT Visit: 1 Visit OT Evaluation $OT Eval Moderate Complexity: 1 Mod   Britt Bottom 10/18/2019, 4:17 PM

## 2019-10-18 NOTE — Progress Notes (Signed)
Orthopedic Tech Progress Note Patient Details:  Louis Jordan [birth year] [id number] Was called by PA to come and loosen up splint or to change it out. Patient was in a lot of pain. Removed old one and reapplied a new one Ortho Devices Type of Ortho Device: Short leg splint, Stirrup splint Ortho Device/Splint Location: LUE Ortho Device/Splint Interventions: Removal, Adjustment, Application, Ordered   Post Interventions Patient Tolerated: Well Instructions Provided: Care of device, Adjustment of device   [name] 10/18/2019, 11:18 AM

## 2019-10-18 NOTE — Evaluation (Signed)
Physical Therapy Evaluation Patient Details Name: Louis Jordan MRN: [id number] DOB: 1956/04/30 Today's Date: 10/18/2019   History of Present Illness  Admitted after fall from ladder resulting in L1 burst fracture, now s/p lumbar surgery for fixation (clamshell brace, may don sitting), L ankle comminuted fracture, and L shoulder dislocation; Lumbar surgery 11/1; as of 11/2 has not had surgery on the L ankle yet, and per Ortho consult, await further assessment of L shoulder  Clinical Impression  Pt presents with an overall decrease in functional mobility secondary to above. PTA, pt independent with all mobility. Educ on precautions, positioning, therex, and importance of mobility. Today, pt able to perform bed mobility with emphasis on log roll techniques, min assist for rolling and mod+2 for supine<>sitting EOB . Will continue to assess and progress mobility including transfers and gait as pt is able to tolerate. Pt limited today by pain and nausea with rolling and sitting. Pt would benefit from continued acute PT services to maximize functional mobility and independence prior to d/c to inpatient rehabilitation facility.      Follow Up Recommendations CIR    Equipment Recommendations  Other (comment)(tbd next venue)    Recommendations for Other Services Rehab consult     Precautions / Restrictions Precautions Precautions: Fall;Back Precaution Comments: L shoulder dislocation - maintain sling Required Braces or Orthoses: Sling;Spinal Brace Spinal Brace: Thoracolumbosacral orthotic Restrictions Weight Bearing Restrictions: Yes LLE Weight Bearing: Non weight bearing      Mobility  Bed Mobility Overal bed mobility: Needs Assistance Bed Mobility: Rolling;Supine to Sit;Sit to Supine Rolling: Min assist   Supine to sit: Mod assist;+2 for physical assistance Sit to supine: Mod assist;+2 for physical assistance   General bed mobility comments: Pt performed rolling this session in each  direction x 2 with min assist for LE management and cues for log roll technique to maintain back precautions. Pt performed supine<>sitting EOB this session with mod assist +2 to assist with trunk elevation/control and second therapist assisting with LE management and lines, cues throughout for back precautions and techniques.  Transfers Overall transfer level: (unable this session secondary to pain and nausea)                  Ambulation/Gait                Stairs            Wheelchair Mobility    Modified Rankin (Stroke Patients Only)       Balance Overall balance assessment: Needs assistance   Sitting balance-Leahy Scale: Poor Sitting balance - Comments: CGA for sitting balance EOB                                     Pertinent Vitals/Pain      Home Living Family/patient expects to be discharged to:: Private residence Living Arrangements: Spouse/significant other                    Prior Function                 Hand Dominance        Extremity/Trunk Assessment                Communication      Cognition  General Comments      Exercises     Assessment/Plan    PT Assessment Patient needs continued PT services  PT Problem List Decreased strength;Decreased safety awareness;Decreased mobility;Decreased range of motion;Decreased activity tolerance;Decreased balance;Impaired sensation;Pain       PT Treatment Interventions DME instruction;Therapeutic activities;Gait training;Therapeutic exercise;Patient/family education;Stair training;Balance training;Wheelchair mobility training;Functional mobility training    PT Goals (Current goals can be found in the Care Plan section)  Acute Rehab PT Goals Patient Stated Goal: "move the right way" PT Goal Formulation: With patient Time For Goal Achievement: 11/01/19 Potential to Achieve Goals: Good     Frequency Min 5X/week   Barriers to discharge Inaccessible home environment 1 STE    Co-evaluation               AM-PAC PT "6 Clicks" Mobility  Outcome Measure Help needed turning from your back to your side while in a flat bed without using bedrails?: A Little Help needed moving from lying on your back to sitting on the side of a flat bed without using bedrails?: A Lot Help needed moving to and from a bed to a chair (including a wheelchair)?: A Lot Help needed standing up from a chair using your arms (e.g., wheelchair or bedside chair)?: A Lot Help needed to walk in hospital room?: Total Help needed climbing 3-5 steps with a railing? : Total 6 Click Score: 11    End of Session   Activity Tolerance: Patient limited by pain;Patient limited by fatigue Patient left: in bed;with bed alarm set;with call bell/phone within reach;with family/visitor present Nurse Communication: Mobility status PT Visit Diagnosis: Muscle weakness (generalized) (M62.81);Pain Pain - Right/Left: Right Pain - part of body: Shoulder(and low back)    Time: 2671-2458 PT Time Calculation (min) (ACUTE ONLY): 35 min   Charges:   PT Evaluation $PT Eval Moderate Complexity: 1 Mod          Cresenciano Genre, PT, DPT, CSRS Acute Rehab Office [phone]   Louis Jordan 10/18/2019, 2:28 PM

## 2019-10-18 NOTE — Consult Note (Signed)
I was asked to participate in this patient's care by Dr. Mardelle Matte due to a timing issue.  In short the patient's had a polytrauma injury with a spine fracture now status post fixation by the neurosurgeons and a ipsilateral left shoulder and ankle injury.  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  The CT demonstrates a anterior 20% bony loss of the glenoid with fragmentation of the fracture.  There is essentially a bony Bankart injury.    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  We talked about 3 options including arthroscopic capsulorrhaphy and bony Bankart repair, open laterjet to treat primary instability and the possibility of a reverse total shoulder arthroplasty.  We think that reverse total shoulder arthroplasty is not indicated based on the patient's functional level and can be saved as a bailout/salvage procedure.  Between his options of ladders a versus arthroscopic capsulorrhaphy the patient understanding there is a higher risk of recurrent instability shows arthroscopic stabilization which we think is reasonable considering his function and if he fails this he would likely proceed to a reverse total shoulder arthroplasty rather than a open stabilization procedure.  We do not feel that an ORIF of the glenoid would work well as the fragments are relatively comminuted.  Risk benefits and alternatives of an arthroscopic stabilization were discussed including the risk of at least 15 to 20% recurrent instability, extended recovery and nonweightbearing status for 6 weeks and then a 2 pound weight limit until 3 months postop.  Patient elected to proceed.  We will work on a combination case with Dr. Doreatha Martin for tomorrow.

## 2019-10-18 NOTE — Progress Notes (Signed)
Rehab Admissions Coordinator Note:  Patient was screened by Cleatrice Burke for appropriateness for an Inpatient Acute Rehab Consult. At this time, we are recommending Inpatient Rehab consult. Please place order for consult.  Cleatrice Burke 10/18/2019, 6:17 PM  I can be reached at [phone].

## 2019-10-19 ENCOUNTER — Inpatient Hospital Stay (HOSPITAL_COMMUNITY): Payer: Commercial Managed Care - PPO

## 2019-10-19 ENCOUNTER — Inpatient Hospital Stay (HOSPITAL_COMMUNITY): Payer: Commercial Managed Care - PPO | Admitting: Certified Registered Nurse Anesthetist

## 2019-10-19 ENCOUNTER — Encounter (HOSPITAL_COMMUNITY): Admission: EM | Disposition: A | Discharge status: Rehabilitation Facility | Payer: Self-pay | Source: Home / Self Care

## 2019-10-19 ENCOUNTER — Encounter (HOSPITAL_COMMUNITY): Payer: Self-pay | Admitting: *Deleted

## 2019-10-19 HISTORY — PX: OPEN REDUCTION INTERNAL FIXATION (ORIF) TIBIA/FIBULA FRACTURE: SHX5992

## 2019-10-19 HISTORY — PX: SHOULDER ARTHROSCOPY WITH BANKART REPAIR: SHX5673

## 2019-10-19 LAB — BASIC METABOLIC PANEL
Anion gap: 9 (ref 5–15)
BUN: 21 mg/dL (ref 8–23)
CO2: 27 mmol/L (ref 22–32)
Calcium: 8.6 mg/dL — ABNORMAL LOW (ref 8.9–10.3)
Chloride: 103 mmol/L (ref 98–111)
Creatinine, Ser: 0.79 mg/dL (ref 0.61–1.24)
GFR calc Af Amer: >60 mL/min (ref >60)
GFR calc non Af Amer: >60 mL/min (ref >60)
Glucose, Bld: 134 mg/dL — ABNORMAL HIGH (ref 70–99)
Potassium: 4.2 mmol/L (ref 3.5–5.1)
Sodium: 139 mmol/L (ref 135–145)

## 2019-10-19 LAB — PHOSPHORUS: Phosphorus: 3.5 mg/dL (ref 2.5–4.6)

## 2019-10-19 LAB — MAGNESIUM: Magnesium: 2 mg/dL (ref 1.7–2.4)

## 2019-10-19 SURGERY — OPEN REDUCTION INTERNAL FIXATION (ORIF) TIBIA/FIBULA FRACTURE
Anesthesia: General | Site: Shoulder | Laterality: Left

## 2019-10-19 MED ORDER — MORPHINE SULFATE (PF) 4 MG/ML IV SOLN
4.0000 mg | INTRAVENOUS | Status: DC | PRN
  Administered 2019-10-19: 4 mg via INTRAVENOUS
  Filled 2019-10-19: qty 1

## 2019-10-19 MED ORDER — MEPERIDINE HCL 25 MG/ML IJ SOLN: 6.2500 mg | INTRAMUSCULAR | Status: DC | PRN

## 2019-10-19 MED ORDER — LACTATED RINGERS IV SOLN
INTRAVENOUS | Status: DC | PRN
  Administered 2019-10-19 (×2): via INTRAVENOUS

## 2019-10-19 MED ORDER — SUGAMMADEX SODIUM 200 MG/2ML IV SOLN
INTRAVENOUS | Status: DC | PRN
  Administered 2019-10-19: 200 mg via INTRAVENOUS

## 2019-10-19 MED ORDER — VANCOMYCIN HCL 1000 MG IV SOLR
INTRAVENOUS | Status: DC | PRN
  Administered 2019-10-19: 1000 mg via TOPICAL

## 2019-10-19 MED ORDER — FENTANYL CITRATE (PF) 100 MCG/2ML IJ SOLN
100.0000 ug | Freq: Once | INTRAMUSCULAR | Status: AC
  Administered 2019-10-19: 11:00:00 100 ug via INTRAVENOUS

## 2019-10-19 MED ORDER — PROPOFOL 10 MG/ML IV BOLUS
INTRAVENOUS | Status: AC
  Filled 2019-10-19: qty 20

## 2019-10-19 MED ORDER — SCOPOLAMINE 1 MG/3DAYS TD PT72
1.0000 | MEDICATED_PATCH | TRANSDERMAL | Status: DC
  Administered 2019-10-19: 1.5 mg via TRANSDERMAL
  Filled 2019-10-19: qty 1

## 2019-10-19 MED ORDER — KETOROLAC TROMETHAMINE 30 MG/ML IJ SOLN
INTRAMUSCULAR | Status: AC
  Filled 2019-10-19: qty 1

## 2019-10-19 MED ORDER — OXYCODONE HCL 5 MG/5ML PO SOLN: 5.0000 mg | Freq: Once | ORAL | Status: DC | PRN

## 2019-10-19 MED ORDER — MIDAZOLAM HCL 2 MG/2ML IJ SOLN
INTRAMUSCULAR | Status: AC
  Administered 2019-10-19: 2 mg via INTRAVENOUS
  Filled 2019-10-19: qty 2

## 2019-10-19 MED ORDER — ONDANSETRON HCL 4 MG/2ML IJ SOLN
INTRAMUSCULAR | Status: AC
  Filled 2019-10-19: qty 2

## 2019-10-19 MED ORDER — HYDROMORPHONE HCL 1 MG/ML IJ SOLN
0.2500 mg | INTRAMUSCULAR | Status: DC | PRN
  Administered 2019-10-19: 0.5 mg via INTRAVENOUS

## 2019-10-19 MED ORDER — MORPHINE SULFATE (PF) 2 MG/ML IV SOLN
2.0000 mg | INTRAVENOUS | Status: DC | PRN
  Administered 2019-10-20 (×2): 4 mg via INTRAVENOUS
  Filled 2019-10-19 (×2): qty 2
  Filled 2019-10-19 (×2): qty 1

## 2019-10-19 MED ORDER — PROMETHAZINE HCL 25 MG/ML IJ SOLN
INTRAMUSCULAR | Status: DC | PRN
  Administered 2019-10-19: 6.25 mg via INTRAVENOUS

## 2019-10-19 MED ORDER — KETOROLAC TROMETHAMINE 30 MG/ML IJ SOLN
30.0000 mg | Freq: Once | INTRAMUSCULAR | Status: AC | PRN
  Administered 2019-10-19: 30 mg via INTRAVENOUS

## 2019-10-19 MED ORDER — MIDAZOLAM HCL 2 MG/2ML IJ SOLN
INTRAMUSCULAR | Status: AC
  Filled 2019-10-19: qty 2

## 2019-10-19 MED ORDER — FENTANYL CITRATE (PF) 100 MCG/2ML IJ SOLN
INTRAMUSCULAR | Status: AC
  Administered 2019-10-19: 100 ug via INTRAVENOUS
  Filled 2019-10-19: qty 2

## 2019-10-19 MED ORDER — CLINDAMYCIN PHOSPHATE 900 MG/50ML IV SOLN
900.0000 mg | INTRAVENOUS | Status: AC
  Administered 2019-10-19: 900 mg via INTRAVENOUS
  Filled 2019-10-19: qty 50

## 2019-10-19 MED ORDER — FENTANYL CITRATE (PF) 250 MCG/5ML IJ SOLN
INTRAMUSCULAR | Status: DC | PRN
  Administered 2019-10-19: 50 ug via INTRAVENOUS
  Administered 2019-10-19 (×2): 25 ug via INTRAVENOUS
  Administered 2019-10-19: 50 ug via INTRAVENOUS

## 2019-10-19 MED ORDER — METOCLOPRAMIDE HCL 5 MG PO TABS: 5.0000 mg | ORAL_TABLET | Freq: Three times a day (TID) | ORAL | Status: DC | PRN

## 2019-10-19 MED ORDER — LIDOCAINE 2% (20 MG/ML) 5 ML SYRINGE
INTRAMUSCULAR | Status: AC
  Filled 2019-10-19: qty 5

## 2019-10-19 MED ORDER — DEXAMETHASONE SODIUM PHOSPHATE 10 MG/ML IJ SOLN
INTRAMUSCULAR | Status: DC | PRN
  Administered 2019-10-19 (×2): 10 mg via INTRAVENOUS

## 2019-10-19 MED ORDER — ROCURONIUM BROMIDE 100 MG/10ML IV SOLN
INTRAVENOUS | Status: DC | PRN
  Administered 2019-10-19: 10 mg via INTRAVENOUS
  Administered 2019-10-19: 50 mg via INTRAVENOUS
  Administered 2019-10-19: 10 mg via INTRAVENOUS
  Administered 2019-10-19 (×3): 20 mg via INTRAVENOUS

## 2019-10-19 MED ORDER — PROMETHAZINE HCL 25 MG/ML IJ SOLN: 6.2500 mg | INTRAMUSCULAR | Status: DC | PRN

## 2019-10-19 MED ORDER — EPHEDRINE 5 MG/ML INJ
INTRAVENOUS | Status: AC
  Filled 2019-10-19: qty 10

## 2019-10-19 MED ORDER — VANCOMYCIN HCL 1000 MG IV SOLR
INTRAVENOUS | Status: AC
  Filled 2019-10-19: qty 1000

## 2019-10-19 MED ORDER — OXYCODONE HCL 5 MG PO TABS: 5.0000 mg | ORAL_TABLET | Freq: Once | ORAL | Status: DC | PRN

## 2019-10-19 MED ORDER — 0.9 % SODIUM CHLORIDE (POUR BTL) OPTIME
TOPICAL | Status: DC | PRN
  Administered 2019-10-19: 1000 mL

## 2019-10-19 MED ORDER — EPHEDRINE SULFATE-NACL 50-0.9 MG/10ML-% IV SOSY
PREFILLED_SYRINGE | INTRAVENOUS | Status: DC | PRN
  Administered 2019-10-19: 10 mg via INTRAVENOUS
  Administered 2019-10-19: 5 mg via INTRAVENOUS
  Administered 2019-10-19 (×2): 10 mg via INTRAVENOUS
  Administered 2019-10-19: 5 mg via INTRAVENOUS
  Administered 2019-10-19: 10 mg via INTRAVENOUS

## 2019-10-19 MED ORDER — ROPIVACAINE HCL 5 MG/ML IJ SOLN
INTRAMUSCULAR | Status: DC | PRN
  Administered 2019-10-19: 40 mL via EPIDURAL

## 2019-10-19 MED ORDER — ONDANSETRON HCL 4 MG/2ML IJ SOLN
INTRAMUSCULAR | Status: DC | PRN
  Administered 2019-10-19 (×2): 4 mg via INTRAVENOUS

## 2019-10-19 MED ORDER — METOCLOPRAMIDE HCL 5 MG/ML IJ SOLN: 5.0000 mg | Freq: Three times a day (TID) | INTRAMUSCULAR | Status: DC | PRN

## 2019-10-19 MED ORDER — PROPOFOL 10 MG/ML IV BOLUS
INTRAVENOUS | Status: DC | PRN
  Administered 2019-10-19: 150 mg via INTRAVENOUS

## 2019-10-19 MED ORDER — CLINDAMYCIN PHOSPHATE 900 MG/50ML IV SOLN
900.0000 mg | Freq: Three times a day (TID) | INTRAVENOUS | Status: AC
  Administered 2019-10-19 – 2019-10-20 (×3): 900 mg via INTRAVENOUS
  Filled 2019-10-19 (×3): qty 50

## 2019-10-19 MED ORDER — HYDROMORPHONE HCL 1 MG/ML IJ SOLN
INTRAMUSCULAR | Status: AC
  Filled 2019-10-19: qty 1

## 2019-10-19 MED ORDER — ROCURONIUM BROMIDE 10 MG/ML (PF) SYRINGE
PREFILLED_SYRINGE | INTRAVENOUS | Status: AC
  Filled 2019-10-19: qty 10

## 2019-10-19 MED ORDER — KETOROLAC TROMETHAMINE 15 MG/ML IJ SOLN
INTRAMUSCULAR | Status: AC
  Filled 2019-10-19: qty 1

## 2019-10-19 MED ORDER — PHENYLEPHRINE HCL-NACL 10-0.9 MG/250ML-% IV SOLN
INTRAVENOUS | Status: DC | PRN
  Administered 2019-10-19: 50 ug/min via INTRAVENOUS

## 2019-10-19 MED ORDER — SODIUM CHLORIDE 0.9 % IR SOLN
Status: DC | PRN
  Administered 2019-10-19 (×5): 3000 mL

## 2019-10-19 MED ORDER — MIDAZOLAM HCL 2 MG/2ML IJ SOLN
2.0000 mg | Freq: Once | INTRAMUSCULAR | Status: AC
  Administered 2019-10-19: 11:00:00 2 mg via INTRAVENOUS

## 2019-10-19 MED ORDER — CHLORHEXIDINE GLUCONATE 4 % EX LIQD
60.0000 mL | Freq: Once | CUTANEOUS | Status: DC
  Filled 2019-10-19: qty 60

## 2019-10-19 MED ORDER — LIDOCAINE 2% (20 MG/ML) 5 ML SYRINGE
INTRAMUSCULAR | Status: DC | PRN
  Administered 2019-10-19: 40 mg via INTRAVENOUS

## 2019-10-19 MED ORDER — ROCURONIUM BROMIDE 10 MG/ML (PF) SYRINGE
PREFILLED_SYRINGE | INTRAVENOUS | Status: AC
  Filled 2019-10-19: qty 30

## 2019-10-19 MED ORDER — OXYCODONE HCL 5 MG PO TABS
5.0000 mg | ORAL_TABLET | ORAL | Status: DC | PRN
  Administered 2019-10-19 – 2019-10-25 (×25): 10 mg via ORAL
  Filled 2019-10-19: qty 2
  Filled 2019-10-19: qty 1
  Filled 2019-10-19 (×15): qty 2
  Filled 2019-10-19: qty 1
  Filled 2019-10-19 (×8): qty 2

## 2019-10-19 MED ORDER — FENTANYL CITRATE (PF) 250 MCG/5ML IJ SOLN
INTRAMUSCULAR | Status: AC
  Filled 2019-10-19: qty 5

## 2019-10-19 MED ORDER — DEXAMETHASONE SODIUM PHOSPHATE 10 MG/ML IJ SOLN
INTRAMUSCULAR | Status: AC
  Filled 2019-10-19: qty 1

## 2019-10-19 SURGICAL SUPPLY — 97 items
ANCHOR SUT 1.8 FBRTK KNTLS 2SU (Anchor) ×12 IMPLANT
BANDAGE ESMARK 6X9 LF (GAUZE/BANDAGES/DRESSINGS) ×2 IMPLANT
BIT DRILL 2.5 X LONG (BIT) ×2
BIT DRILL QC 3.5 150 (BIT) ×1 IMPLANT
BIT DRILL QC 3.5 150MM (BIT) ×1
BIT DRILL QC SFS 2.8X200 (BIT) ×2 IMPLANT
BIT DRILL X LONG 2.5 (BIT) IMPLANT
BLADE EXCALIBUR 4.0MM X 13CM (MISCELLANEOUS) ×1
BLADE EXCALIBUR 4.0X13 (MISCELLANEOUS) ×3 IMPLANT
BNDG COHESIVE 4X5 TAN STRL (GAUZE/BANDAGES/DRESSINGS) ×4 IMPLANT
BNDG ELASTIC 4X5.8 VLCR STR LF (GAUZE/BANDAGES/DRESSINGS) ×2 IMPLANT
BNDG ELASTIC 6X5.8 VLCR STR LF (GAUZE/BANDAGES/DRESSINGS) ×2 IMPLANT
BNDG ESMARK 6X9 LF (GAUZE/BANDAGES/DRESSINGS) ×4
BRUSH SCRUB EZ PLAIN DRY (MISCELLANEOUS) ×8 IMPLANT
CANNULA 5.75X71 LONG (CANNULA) ×2 IMPLANT
CANNULA TWIST IN 8.25X7CM (CANNULA) ×4 IMPLANT
CHLORAPREP W/TINT 26 (MISCELLANEOUS) ×8 IMPLANT
CLOSURE STERI-STRIP 1/2X4 (GAUZE/BANDAGES/DRESSINGS) ×1
CLSR STERI-STRIP ANTIMIC 1/2X4 (GAUZE/BANDAGES/DRESSINGS) ×3 IMPLANT
COVER SURGICAL LIGHT HANDLE (MISCELLANEOUS) ×6 IMPLANT
DRAPE C-ARM 42X72 X-RAY (DRAPES) ×4 IMPLANT
DRAPE C-ARMOR (DRAPES) ×4 IMPLANT
DRAPE IMP U-DRAPE 54X76 (DRAPES) ×4 IMPLANT
DRAPE INCISE IOBAN 66X45 STRL (DRAPES) ×2 IMPLANT
DRAPE ORTHO SPLIT 77X108 STRL (DRAPES) ×4
DRAPE SHOULDER BEACH CHAIR (DRAPES) ×4 IMPLANT
DRAPE SURG ORHT 6 SPLT 77X108 (DRAPES) ×4 IMPLANT
DRAPE U-SHAPE 47X51 STRL (DRAPES) ×4 IMPLANT
DRILL BIT X LONG 2.5 (BIT) ×2
DRSG ADAPTIC 3X8 NADH LF (GAUZE/BANDAGES/DRESSINGS) ×2 IMPLANT
DRSG PAD ABDOMINAL 8X10 ST (GAUZE/BANDAGES/DRESSINGS) ×4 IMPLANT
DW OUTFLOW CASSETTE/TUBE SET (MISCELLANEOUS) ×4 IMPLANT
ELECT REM PT RETURN 9FT ADLT (ELECTROSURGICAL) ×4
ELECTRODE REM PT RTRN 9FT ADLT (ELECTROSURGICAL) ×2 IMPLANT
GAUZE SPONGE 4X4 12PLY STRL (GAUZE/BANDAGES/DRESSINGS) ×4 IMPLANT
GLOVE BIO SURGEON STRL SZ 6.5 (GLOVE) ×12 IMPLANT
GLOVE BIO SURGEON STRL SZ7.5 (GLOVE) ×12 IMPLANT
GLOVE BIO SURGEONS STRL SZ 6.5 (GLOVE) ×4
GLOVE BIOGEL PI IND STRL 6.5 (GLOVE) ×4 IMPLANT
GLOVE BIOGEL PI IND STRL 7.5 (GLOVE) ×2 IMPLANT
GLOVE BIOGEL PI IND STRL 8 (GLOVE) ×2 IMPLANT
GLOVE BIOGEL PI INDICATOR 6.5 (GLOVE) ×4
GLOVE BIOGEL PI INDICATOR 7.5 (GLOVE) ×2
GLOVE BIOGEL PI INDICATOR 8 (GLOVE) ×2
GLOVE ECLIPSE 8.0 STRL XLNG CF (GLOVE) ×4 IMPLANT
GOWN STRL REUS W/ TWL LRG LVL3 (GOWN DISPOSABLE) ×6 IMPLANT
GOWN STRL REUS W/TWL LRG LVL3 (GOWN DISPOSABLE) ×6
GOWN STRL REUS W/TWL XL LVL3 (GOWN DISPOSABLE) ×4 IMPLANT
KIT BASIN OR (CUSTOM PROCEDURE TRAY) ×4 IMPLANT
KIT PERC INSERT 3.0 KNTLS (KITS) ×2 IMPLANT
KIT STABILIZATION SHOULDER (MISCELLANEOUS) ×4 IMPLANT
KIT STR SPEAR 1.8 FBRTK DISP (KITS) ×2 IMPLANT
KIT TURNOVER KIT B (KITS) ×4 IMPLANT
LASSO 90 CVE QUICKPAS (DISPOSABLE) ×2 IMPLANT
MANIFOLD NEPTUNE II (INSTRUMENTS) ×8 IMPLANT
NDL HYPO 25GX1X1/2 BEV (NEEDLE) ×2 IMPLANT
NDL SAFETY ECLIPSE 18X1.5 (NEEDLE) ×2 IMPLANT
NEEDLE HYPO 18GX1.5 SHARP (NEEDLE) ×2
NEEDLE HYPO 25GX1X1/2 BEV (NEEDLE) ×4 IMPLANT
NS IRRIG 1000ML POUR BTL (IV SOLUTION) ×4 IMPLANT
PACK ARTHROSCOPY DSU (CUSTOM PROCEDURE TRAY) ×4 IMPLANT
PACK TOTAL JOINT (CUSTOM PROCEDURE TRAY) ×4 IMPLANT
PAD ARMBOARD 7.5X6 YLW CONV (MISCELLANEOUS) ×6 IMPLANT
PAD CAST 4YDX4 CTTN HI CHSV (CAST SUPPLIES) IMPLANT
PAD ORTHO SHOULDER 7X19 LRG (SOFTGOODS) ×4 IMPLANT
PADDING CAST COTTON 4X4 STRL (CAST SUPPLIES) ×2
PADDING CAST COTTON 6X4 STRL (CAST SUPPLIES) ×2 IMPLANT
PLATE LCP 3.5 1/3 TUB 5HX57 (Plate) ×2 IMPLANT
SCREW CORT HEADED ST 3.5X30 (Screw) ×2 IMPLANT
SCREW CORT HEADED ST 3.5X32 (Screw) ×2 IMPLANT
SCREW HEADED ST 3.5X100 (Screw) ×2 IMPLANT
SCREW HEADED ST 3.5X34 (Screw) ×2 IMPLANT
SCREW HEADED ST 3.5X36 (Screw) ×2 IMPLANT
SCREW HEADED ST 3.5X48 (Screw) ×4 IMPLANT
SCREW LOCK T15 FT 16X3.5X2.9X (Screw) IMPLANT
SCREW LOCKING 3.5X16 (Screw) ×2 IMPLANT
SLEEVE ARM SUSPENSION SYSTEM (MISCELLANEOUS) ×2 IMPLANT
SLING S3 LATERAL DISP (MISCELLANEOUS) ×2 IMPLANT
SPONGE LAP 18X18 RF (DISPOSABLE) IMPLANT
STAPLER VISISTAT 35W (STAPLE) ×4 IMPLANT
SUCTION FRAZIER HANDLE 10FR (MISCELLANEOUS) ×2
SUCTION TUBE FRAZIER 10FR DISP (MISCELLANEOUS) ×2 IMPLANT
SUT ETHILON 3 0 PS 1 (SUTURE) ×8 IMPLANT
SUT MNCRL AB 3-0 PS2 18 (SUTURE) ×2 IMPLANT
SUT MNCRL AB 4-0 PS2 18 (SUTURE) ×6 IMPLANT
SUT PROLENE 0 CT (SUTURE) IMPLANT
SUT VIC AB 0 CT1 27 (SUTURE) ×2
SUT VIC AB 0 CT1 27XBRD ANBCTR (SUTURE) ×2 IMPLANT
SUT VIC AB 2-0 CT1 27 (SUTURE) ×4
SUT VIC AB 2-0 CT1 TAPERPNT 27 (SUTURE) ×4 IMPLANT
SYR 5ML LL (SYRINGE) ×4 IMPLANT
SYR CONTROL 10ML LL (SYRINGE) ×4 IMPLANT
TOWEL GREEN STERILE (TOWEL DISPOSABLE) ×8 IMPLANT
TOWEL GREEN STERILE FF (TOWEL DISPOSABLE) ×8 IMPLANT
TUBING ARTHROSCOPY IRRIG 16FT (MISCELLANEOUS) ×4 IMPLANT
UNDERPAD 30X30 (UNDERPADS AND DIAPERS) ×4 IMPLANT
WATER STERILE IRR 1000ML POUR (IV SOLUTION) ×4 IMPLANT

## 2019-10-19 NOTE — Progress Notes (Signed)
Patient ID: Louis Jordan, male   DOB: 01/09/1956, 63 y.o.   MRN: [id number]    3 Days Post-Op  Subjective: Good spirits today.  Pain mostly in back today.  Was eating ok, but nothing today due to NPO status.  Morphine working well for him.  ROS: See above, otherwise other systems negative  Objective: Vital signs in last 24 hours: Temp:  [97.8 F (36.6 C)-98.6 F (37 C)] 97.9 F (36.6 C) (11/04 0329) Pulse Rate:  [56-62] 56 (11/04 0329) Resp:  [16-18] 18 (11/04 0329) BP: (137-160)/(71-80) 137/72 (11/04 0329) SpO2:  [97 %-100 %] 97 % (11/04 0329) Last BM Date: 10/15/19  Intake/Output from previous day: 11/03 0701 - 11/04 0700 In: 120 [P.O.:120] Out: 2285 [Urine:2100; Drains:185] Intake/Output this shift: No intake/output data recorded.  PE: Gen: NAD Heart: regular Lungs: CTAB Abd: soft, NT, ND, +BS GU: foley in place still with good yellow urine output Ext: LUE in sling.  Good cap refill and moves wrist and fingers.  LLE in splint, no numbness at toes, wiggles toes, good cap refill.  Davol drain in place in back with minimal serosang output currently  Lab Results:  Recent Labs    10/17/19 1024 10/18/19 0412  WBC 12.2* 9.5  HGB 10.0* 9.4*  HCT 29.9* 28.2*  PLT 195 186   BMET Recent Labs    10/18/19 0412 10/19/19 0124  NA 140 139  K 4.2 4.2  CL 105 103  CO2 29 27  GLUCOSE 118* 134*  BUN 16 21  CREATININE 0.78 0.79  CALCIUM 8.5* 8.6*   PT/INR No results for input(s): LABPROT, INR in the last 72 hours. CMP     Component Value Date/Time   NA 139 10/19/2019 0124   K 4.2 10/19/2019 0124   CL 103 10/19/2019 0124   CO2 27 10/19/2019 0124   GLUCOSE 134 (H) 10/19/2019 0124   BUN 21 10/19/2019 0124   CREATININE 0.79 10/19/2019 0124   CALCIUM 8.6 (L) 10/19/2019 0124   PROT 6.1 (L) 10/15/2019 1556   ALBUMIN 3.9 10/15/2019 1556   AST 36 10/15/2019 1556   ALT 28 10/15/2019 1556   ALKPHOS 53 10/15/2019 1556   BILITOT 0.6 10/15/2019 1556   GFRNONAA >60 10/19/2019  0124   GFRAA >60 10/19/2019 0124   Lipase  No results found for: LIPASE     Studies/Results: Ct Shoulder Left Wo Contrast  Result Date: 10/17/2019 CLINICAL DATA:  Left shoulder dislocation after falling off a ladder. EXAM: CT OF THE UPPER LEFT EXTREMITY WITHOUT CONTRAST TECHNIQUE: Multidetector CT imaging of the upper left extremity was performed according to the standard protocol. COMPARISON:  MRI left shoulder from same day. FINDINGS: Bones/Joint/Cartilage Again seen is a large bony Bankart lesion involving the anterior and anterior inferior glenoid with two large displaced fragments in the subscapularis and anterior axillary recesses. The fracture involves approximately 25% of the glenoid surface. Again seen is a large impaction fracture of the posterosuperior humeral head. No dislocation. Small hemarthrosis. Mild acromioclavicular osteoarthritis. Prior C5-C7 ACDF with solid fusion. Ligaments Suboptimally assessed by CT. Muscles and Tendons Grossly intact.  No muscle atrophy. Soft tissues New small left pleural effusion. Small amount of hemorrhage in fluid in the left axilla. IMPRESSION: 1. 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 2. Large Hill-Sachs deformity. 3. New small left pleural effusion. Electronically Signed   By: Obie Dredge M.D.   On: 10/17/2019 16:06   Dg Shoulder  Left Port  Result Date: 10/17/2019 CLINICAL DATA:  Shoulder dislocation EXAM: LEFT SHOULDER - 1 VIEW COMPARISON:  Earlier same day FINDINGS: There is now anatomic alignment at the left glenohumeral joint. No acute fracture. No acute findings. IMPRESSION: Post reduction of left shoulder dislocation with anatomic alignment. Electronically Signed   By: Macy Mis M.D.   On: 10/17/2019 14:45   Dg Shoulder Left Port  Result Date: 10/17/2019 CLINICAL DATA:  Fall, dislocation EXAM: LEFT SHOULDER - 1 VIEW COMPARISON:  October 15, 2019  FINDINGS: The humeral head is anterior and inferior to its expected location. No acute fracture. Partially imaged cervical and thoracic fixation hardware. IMPRESSION: Left anterior shoulder dislocation. Electronically Signed   By: Macy Mis M.D.   On: 10/17/2019 10:48    Anti-infectives: Anti-infectives (From admission, onward)   Start     Dose/Rate Route Frequency Ordered Stop   10/19/19 1130  clindamycin (CLEOCIN) IVPB 900 mg     900 mg 100 mL/hr over 30 Minutes Intravenous On call to O.R. 10/19/19 0712 10/20/19 0559   10/16/19 2130  vancomycin (VANCOCIN) IVPB 1000 mg/200 mL premix  Status:  Discontinued     1,000 mg 200 mL/hr over 60 Minutes Intravenous  Once 10/16/19 1704 10/16/19 1737   10/16/19 2130  vancomycin (VANCOCIN) IVPB 1000 mg/200 mL premix  Status:  Discontinued     1,000 mg 200 mL/hr over 60 Minutes Intravenous Every 12 hours 10/16/19 1737 10/17/19 2021   10/16/19 1342  vancomycin (VANCOCIN) powder  Status:  Discontinued       As needed 10/16/19 1342 10/16/19 1431   10/16/19 1049  bacitracin 50,000 Units in sodium chloride 0.9 % 500 mL irrigation  Status:  Discontinued       As needed 10/16/19 1049 10/16/19 1431   10/16/19 0922  vancomycin (VANCOCIN) 1-5 GM/200ML-% IVPB    Note to Pharmacy: Bobbie Stack   : cabinet override      10/16/19 0922 10/16/19 1740       Assessment/Plan Fall from ladder L 1 burst fracture- fusion 11/1 Ronnald Ramp), now with peroneal numbness and urinary retention. Foley in place.On decadron per neuro L anterior shoulder dislocation-OR today with Dr. Griffin Basil for repair L ankle trimalleolar fracture- OR today with Dr. Doreatha Martin for ORIF FEN -NPO for OR  Foley - In place for urinary retention VTE -SCD, Lovenox can be started 5 days post back surgery per NS ID -van 11/1-11/2 for back  Dispo: PT/OT after OR, CIR consult   LOS: 4 days    Henreitta Cea , [facility] Surgery 10/19/2019, 9:55 AM Please see Amion for pager  number during day hours 7:00am-4:30pm

## 2019-10-19 NOTE — Anesthesia Procedure Notes (Signed)
Anesthesia Regional Block: Adductor canal block   Pre-Anesthetic Checklist: ,, timeout performed, Correct Patient, Correct Site, Correct Laterality, Correct Procedure, Correct Position, site marked, Risks and benefits discussed,  Surgical consent,  Pre-op evaluation,  At surgeon's request and post-op pain management  Laterality: Left  Prep: Maximum Sterile Barrier Precautions used, chloraprep       Needles:  Injection technique: Single-shot  Needle Type: Echogenic Stimulator Needle     Needle Length: 9cm  Needle Gauge: 22     Additional Needles:   Procedures:,,,, ultrasound used (permanent image in chart),,,,  Narrative:  Start time: 10/19/2019 10:45 AM End time: 10/19/2019 10:52 AM Injection made incrementally with aspirations every 5 mL.  Performed by: Personally  Anesthesiologist: Pervis Hocking, DO  Additional Notes: Monitors applied. No increased pain on injection. No increased resistance to injection. Injection made in 5cc increments. Good needle visualization. Patient tolerated procedure well.

## 2019-10-19 NOTE — Anesthesia Preprocedure Evaluation (Addendum)
Anesthesia Evaluation  Patient identified by MRN, date of birth, ID band Patient awake    Reviewed: Allergy & Precautions, NPO status , Patient's Chart, lab work & pertinent test results  History of Anesthesia Complications (+) PONV and history of anesthetic complications  Airway Mallampati: I  TM Distance: >3 FB Neck ROM: Full    Dental   Pulmonary neg pulmonary ROS,    Pulmonary exam normal        Cardiovascular negative cardio ROS Normal cardiovascular exam     Neuro/Psych negative neurological ROS  negative psych ROS   GI/Hepatic negative GI ROS, Neg liver ROS,   Endo/Other  negative endocrine ROS  Renal/GU negative Renal ROS  negative genitourinary   Musculoskeletal L shoulder bankart tear L ankle fx Hx lumbar surgery 10/16/2019   Abdominal   Peds  Hematology negative hematology ROS (+)   Anesthesia Other Findings Fell from ladder 10/15/19- spine fracture, left trimalleolar ankle fracture and anterior dislocation of left shoulder. Now s/p repair of spine fx 3d ago  Reproductive/Obstetrics negative OB ROS                             Anesthesia Physical Anesthesia Plan  ASA: III  Anesthesia Plan: General   Post-op Pain Management: GA combined w/ Regional for post-op pain   Induction: Intravenous  PONV Risk Score and Plan: 3 and Ondansetron, Dexamethasone, Midazolam, Treatment may vary due to age or medical condition, Scopolamine patch - Pre-op and Promethazine  Airway Management Planned: Oral ETT and Video Laryngoscope Planned  Additional Equipment: None  Intra-op Plan:   Post-operative Plan: Extubation in OR  Informed Consent: I have reviewed the patients History and Physical, chart, labs and discussed the procedure including the risks, benefits and alternatives for the proposed anesthesia with the patient or authorized representative who has indicated his/her understanding  and acceptance.     Dental advisory given  Plan Discussed with: CRNA  Anesthesia Plan Comments: (glidescope needed last surgery)       Anesthesia Quick Evaluation

## 2019-10-19 NOTE — H&P (View-Only) (Signed)
Orthopaedic Trauma Service (OTS) Consult   Patient ID: Louis Jordan MRN: [id number] DOB/AGE: 01/06/1956 63 y.o.  Reason for Consult: Left distal tibia/ankle fracture Referring Physician: Dr. Joshua Landau, MD ([facility])  HPI: Louis Azzarello is an 63 y.o. male being seen in consultation at the request of Dr. Landau for left ankle fracture. Patient presented to emergency department on 10/15/19 with left shoulder and left leg pain after falling from 12 foot ladder. Found to have spine fracture, left trimalleolar ankle fracture and anterior dislocation of left shoulder. Patient was placed in short leg splint and was admitted to trauma service.  Orthopaedics was consulted for shoulder and ankle injuries. Due to OR scheduling and surgeon availability, Dr. Landau asked orthopaedic trauma service to assume care for definitive fixation of patient's ankle.   Patient evaluated on 6N this morning. He is now status post fixation of spine fracture by the neurosurgeons. Left short leg splint was re-applied yesterday after patient experienced increased ankle pain and feeling of splint being too tight. He notes improvement in ankle pain now. Main complaints this morning is his back pain. Patient is an avid body builder. He lives here in [place], [place]. 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 He has one step to enter his home. Patient is requesting he receive phenergan before being intubated, states he gets very sick when waking up from anesthesia if he does not. He is concerned about how he will be able to mobilize with therapy post-operatively.  Past Medical History:  Diagnosis Date  . Complication of anesthesia   . PONV (postoperative nausea and vomiting)     Past Surgical History:  Procedure Laterality Date  . APPENDECTOMY    . EYE MUSCLE SURGERY    . KNEE SURGERY Right   . LUMBAR SPINE SURGERY  10/16/2019   L1 burst fracture with epidural hematoma and canal stenosis  . TONSILLECTOMY       History reviewed. No pertinent family history.  Social History:  reports that he has never smoked. He has never used smokeless tobacco. He reports current alcohol use. He reports that he does not use drugs.  Allergies:  Allergies  Allergen Reactions  . Penicillins Swelling    Medications: I have reviewed the patient's current medications.  ROS: Constitutional: No fever or chills Vision: No changes in vision ENT: No difficulty swallowing CV: No chest pain Pulm: No SOB or wheezing GI: No nausea or vomiting GU: No urgency or inability to hold urine Skin: No poor wound healing  Neurologic: No numbness or tingling Psychiatric: No depression or anxiety Heme: No bruising Allergic: No reaction to medications or food   Exam: Blood pressure 137/72, pulse (!) 56, temperature 97.9 F (36.6 C), temperature source Oral, resp. rate 18, height 6' (1.829 m), weight 70.3 kg, SpO2 97 %. General: Laying flat in bed, NAD Orientation: Alert and oriented x3 Mood and Affect: Pleasant and cooperative Gait: Not assessed due to known fracture Coordination and balance: Within normal limits  Left Lower Extremity: Short leg splint in place. Non-tender above splint. Able to wiggle toes. Sensation intact to light touch. Toes warm and well perfused. 2+ DP pulse  Right Lower Extremity: Skin without lesions. No tenderness to palpation. Full painless ROM. Motor and sensory function intact. Neurovascularly intact   Medical Decision Making: Data: Imaging: X-ray and CT scan of left ankle show a trimalleolar fracture of the left ankle with varus tilt of the talus relative to the tibial plafond without dislocation.   Additionally, there is a small avulsion fracture from the inferior tip of the lateral malleolus  Labs:  Results for orders placed or performed during the hospital encounter of 10/15/19 (from the past 24 hour(s))  Basic metabolic panel     Status: Abnormal   Collection Time: 10/19/19  1:24 AM   Result Value Ref Range   Sodium 139 135 - 145 mmol/L   Potassium 4.2 3.5 - 5.1 mmol/L   Chloride 103 98 - 111 mmol/L   CO2 27 22 - 32 mmol/L   Glucose, Bld 134 (H) 70 - 99 mg/dL   BUN 21 8 - 23 mg/dL   Creatinine, Ser 0.79 0.61 - 1.24 mg/dL   Calcium 8.6 (L) 8.9 - 10.3 mg/dL   GFR calc non Af Amer >60 >60 mL/min   GFR calc Af Amer >60 >60 mL/min   Anion gap 9 5 - 15  Magnesium     Status: None   Collection Time: 10/19/19  1:24 AM  Result Value Ref Range   Magnesium 2.0 1.7 - 2.4 mg/dL  Phosphorus     Status: None   Collection Time: 10/19/19  1:24 AM  Result Value Ref Range   Phosphorus 3.5 2.5 - 4.6 mg/dL     Assessment/Plan: 63 year old male s/p fall from ladder, resulting in left trimalleolar fracture of the left ankle  Would recommend proceeding with open reduction internal fixation of the left distal tibia/ankle today.  I discussed risks and benefits of the procedure with the patient. Risks discussed included bleeding requiring blood transfusion, bleeding causing a hematoma, infection, malunion, nonunion, damage to surrounding nerves and blood vessels, pain, hardware prominence or irritation, hardware failure, stiffness, post-traumatic arthritis, DVT/PE, compartment syndrome, and anesthesia complications even including death. Patient agrees to proceed with surgery. We will work on a combination case with Dr. Griffin Basil who will be arthroscopically repairing the patient's left shoulder. All questions answered, consent obtained.   Darlinda Bellows A. [facility] [phone] (office) [email or web address]

## 2019-10-19 NOTE — Progress Notes (Signed)
Orthopedic Tech Progress Note Patient Details:  Chares Slaymaker Dec 02, 1956 [id number]  Ortho Devices Type of Ortho Device: Shoulder abduction pillow Ortho Device/Splint Location: at or desk Ortho Device/Splint Interventions: Removal, Adjustment, Application, Ordered   Post Interventions Patient Tolerated: Well Instructions Provided: Care of device, Adjustment of device   Braulio Bosch 10/19/2019, 2:11 PM

## 2019-10-19 NOTE — Anesthesia Procedure Notes (Signed)
Anesthesia Regional Block: Popliteal block   Pre-Anesthetic Checklist: ,, timeout performed, Correct Patient, Correct Site, Correct Laterality, Correct Procedure, Correct Position, site marked, Risks and benefits discussed,  Surgical consent,  Pre-op evaluation,  At surgeon's request and post-op pain management  Laterality: Left  Prep: Maximum Sterile Barrier Precautions used, chloraprep       Needles:  Injection technique: Single-shot  Needle Type: Echogenic Stimulator Needle     Needle Length: 9cm  Needle Gauge: 22     Additional Needles:   Procedures:,,,, ultrasound used (permanent image in chart),,,,  Narrative:  Start time: 10/19/2019 10:40 AM End time: 10/19/2019 10:45 AM Injection made incrementally with aspirations every 5 mL.  Performed by: Personally  Anesthesiologist: Pervis Hocking, DO  Additional Notes: Monitors applied. No increased pain on injection. No increased resistance to injection. Injection made in 5cc increments. Good needle visualization. Patient tolerated procedure well.

## 2019-10-19 NOTE — Progress Notes (Signed)
Patient states that he is starting to get increased sensation in his perineal area.  Denies weakness in the legs.  Denies numbness in the legs.  He has been taken down for orthopedic surgery.

## 2019-10-19 NOTE — Interval H&P Note (Signed)
History and Physical Interval Note:  10/19/2019 11:15 AM  Louis Jordan  has presented today for surgery, with the diagnosis of LEFT ANKLE FRACTURE AND LEFT SHOULDER BANKART TEAR.  The various methods of treatment have been discussed with the patient and family. After consideration of risks, benefits and other options for treatment, the patient has consented to  Procedure(s): OPEN REDUCTION INTERNAL FIXATION (ORIF) TIBIA/FIBULA FRACTURE (Left) SHOULDER ARTHROSCOPY WITH BANKART REPAIR (Left) as a surgical intervention.  The patient's history has been reviewed, patient examined, no change in status, stable for surgery.  I have reviewed the patient's chart and labs.  Questions were answered to the patient's satisfaction.     Lennette Bihari P Haddix

## 2019-10-19 NOTE — Anesthesia Postprocedure Evaluation (Signed)
Anesthesia Post Note  Patient: Nthony Lefferts  Procedure(s) Performed: OPEN REDUCTION INTERNAL FIXATION (ORIF) TIBIA/FIBULA FRACTURE (Left ) SHOULDER ARTHROSCOPY WITH BANKART REPAIR, EXTENSIVE DEBRIDEMENT, LOOSE BODY REMOVAL (Left Shoulder)     Patient location during evaluation: PACU Anesthesia Type: General Level of consciousness: sedated Pain management: pain level controlled Vital Signs Assessment: post-procedure vital signs reviewed and stable Respiratory status: spontaneous breathing and respiratory function stable Cardiovascular status: stable Postop Assessment: no apparent nausea or vomiting Anesthetic complications: no    Last Vitals:  Vitals:   10/19/19 1706 10/19/19 1721  BP: (!) 160/85 (!) 158/86  Pulse: 67 68  Resp: 12 15  Temp:  36.4 C  SpO2: 99% 94%             Naliyah Neth [name]

## 2019-10-19 NOTE — Op Note (Signed)
Orthopaedic Surgery Operative Note (CSN: [id number])  Louis Jordan  [birth year] Date of Surgery: 10/19/2019   Diagnoses:  Left shoulder large bony Bankart injury with 20-25 percent anterior glenoid bone loss  Procedure: Arthroscopic labral repair and capsulorrhaphy  Arthroscopic loose body removal Arthroscopic extensive debridement   Operative Finding Exam under anesthesia: Clear instability with the head sitting anteriorly and difficult to reduce posteriorly. Articular space: 25% approximately anterior-inferior bone loss with significant loose bodies throughout the joint measuring 1 x 1 cm.  We removed 2-3 of these loose bodies that were both cartilage and osseous.  There was significant bone loss with the anterior bone fragments were fragmented and displaced medial to the anterior aspect of the glenoid.  We able to mobilize these and repair them with a capsule labral/osseous repair. Chondral surfaces:Intact, no sign of chondral degeneration on the glenoid or humeral head Biceps: Mild fraying but otherwise intact Subscapularis: Mild fraying superiorly but intact Superior Cuff: Articular sided 2 to 5% tearing but otherwise intact.   Successful completion of the planned procedure.  This was a unique case and the patient's high level of activity as well as our discussion led to a shared decision to proceed with an arthroscopic procedure rather than a laterjet procedure due to the patient's age and the high level of complication without procedure.  If the patient failed the surgery he was understanding that a reverse total shoulder arthroplasty would likely be his next step.    That said we could not be more satisfied with the repair itself.  Good fixation of all of her anchors and robust imbrication of the tissue anteriorly with a capsular plication as well as appropriate placement of the bony fragments to aid in healing.  This was a combination case with Dr. Jena Gauss to minimize anesthetics and the  patient was handed over to me after his ankle surgery was complete.   Post-operative plan: The patient will be non-weightbearing in a sling for 6 weeks without therapy until the 6-week.  The patient will be readmitted to the trauma service as he is a polytrauma patient.  DVT prophylaxis not indicated in ambulatory upper extremity patient without known risk factors for the shoulder but he will likely be on anticoagulants for his other injuries but defer this to his other surgeons.   Pain control with PRN pain medication preferring oral medicines.  Follow up plan will be scheduled in approximately 7 days for incision check and XR or inpatient if still here.  Post-Op Diagnosis: Same Surgeons: Ramond Marrow Assistants:Caroline McBane PA-C Location: [facility] OR ROOM 06 Anesthesia: General with Exparel interscalene block Antibiotics:Clindamycin prior to the open ankle portion of the case Tourniquet time: None Estimated Blood Loss: Minimal Complications: None Specimens: None Implants: Implant Name Type Inv. Item Serial No. Manufacturer Lot No. LRB No. Used Action  SCREW HEADED ST 3.5X36 - [id number] Screw SCREW HEADED ST 3.5X36  [employer]  Left 1 Implanted  SCREW HEADED ST 3.5X48 - [id number] Screw SCREW HEADED ST 3.5X48  [employer]  Left 2 Implanted  SCREW HEADED ST 3.5X34 - [id number] Screw SCREW HEADED ST 3.5X34  [employer]  Left 1 Implanted  SCREW CORT HEADED ST 3.5X30 - [id number] Screw SCREW CORT HEADED ST 3.5X30  [employer]  Left 1 Implanted  SCREW CORT HEADED ST 3.5X32 - [id number] Screw SCREW CORT HEADED ST 3.5X32  [employer]  Left 1 Implanted  SCREW LOCKING 3.5X16 - [id number] Screw SCREW LOCKING 3.5X16  [employer]  Left 1 Implanted  SCREW HEADED ST 3.5X100 - [id number] Screw SCREW HEADED ST 3.5X100  [employer]  Left 1 Implanted  PLATE TUBULAR W/COLLAR - [id number] Plate PLATE TUBULAR W/COLLAR  [employer]  Left 1 Implanted  [id number] Knotless Fiber TAK     [id number] Left  1 Implanted  [id number]  Knotless Fiber Tak Shoulder   [employer] [id number] Left 1 Implanted  ANCHOR SUT 1.8 FIBERTAK 2 SUT - [id number] Anchor ANCHOR SUT 1.8 FIBERTAK 2 SUT  [employer] [id number] Left 1 Implanted  ANCHOR SUT 1.8 FIBERTAK 2 SUT - [id number] Anchor ANCHOR SUT 1.8 FIBERTAK 2 SUT  [employer] [id number] Left 1 Implanted  ANCHOR SUT 1.8 FIBERTAK 2 SUT - [id number] Anchor ANCHOR SUT 1.8 FIBERTAK 2 SUT  [employer] [id number] Left 1 Implanted  ANCHOR SUT 1.8 FIBERTAK 2 SUT - [id number] Anchor ANCHOR SUT 1.8 FIBERTAK 2 SUT  [employer] [id number] Left 1 Implanted    Indications for Surgery:   Louis JeffersonGreg Farney is a 63 y.o. male with polytraumatic injury after a fall from a ladder with a complex anterior-inferior shoulder dislocation with 25% bone loss and a comminuted bony Bankart injury.  He had multiple dislocation events while he was in the hospital and unfortunately needed to surgery.  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  We can talk to him about his options including a open laterjet, arthroscopic attempted stabilization or primary reverse total shoulder arthroplasty.  Based on the patient's high level of function he desired to avoid the open laterjet procedure and wanted to return to weightlifting if possible and thus was inclined to try an arthroscopic procedure though I quoted him a very high level of recurrent instability.  If he fails this he would need a reverse shoulder arthroplasty and he understands that.  We talked respiratory risk including a high risk of recurrent instability, postoperative arthrosis, infection, pain and need for recovery and rehab.  Procedure:   Dr. Jena GaussHaddix finished his portion of the case and I took over.  The patient was positioned lateral under the same anesthesia on a beanbag and using an Arthrex lateral positioner were able to manipulate the arm.  Patient had already had anesthesia induced but the shoulders prepped and draped in the  usual sterile fashion.  Timeout was called.  A standard posterior viewing portal was made after localizing the portal with a spinal needle.  An anterior accessory portal was also made.  After clearing the articular space the camera was positione  We initially placed our portals and the countered multiple 1 x 1 cm fibro-osseous loose bodies as well as cartilage.  3-4 of these were removed without issue with a grasper.  We then performed extensive debridement of the anterior interval as well as the bony fragments that were too small to be grasped.  This included loose bodies, labral tissue, hematoma and cartilage.  At this point we took a close assessment of the joint and noted that there was a least 25% anterior bone loss.  This was a significant injury and has a high likelihood of instability going forward however we felt that to the patient's decision making and our attempt that we would try to stabilize the joint arthroscopically.  We prepared our anterior inferior Bankart region for bony healing but there is not much preparation needed as the bone fragments were relatively fresh.  Hematoma was cleared.  We mobilized the fracture fragments and then proceeded to place anchors.  We placed  accessory anterior portals as well as a posterior inferior portal and placed cannulas in each.  At that point we placed anchors x6 starting at the 4 o'clock position posteriorly and working forward to the 9 o'clock position.  We able to get good capsular repair as well as reapproximation of the bony fragments.  The head was centered at the end of the case.  The incisions were closed with absorbable monocryl and steri strips.  A sterile dressing was placed along with a sling personally by me and I helped with moving the patient to the table to avoid excess traction on the arm. The patient was awoken from general anesthesia and taken to the PACU in stable condition without complication.   Noemi Chapel, PA-C, present  and scrubbed throughout the case, critical for completion in a timely fashion, and for retraction, instrumentation, closure.

## 2019-10-19 NOTE — Consult Note (Signed)
Orthopaedic Trauma Service (OTS) Consult   Patient ID: Louis Jordan MRN: [id number] DOB/AGE: 23-Sep-1956 63 y.o.  Reason for Consult: Left distal tibia/ankle fracture Referring Physician: Dr. Teryl Lucy, MD Delbert Harness)  HPI: Louis Jordan is an 63 y.o. male being seen in consultation at the request of Dr. Dion Saucier for left ankle fracture. Patient presented to emergency department on 10/15/19 with left shoulder and left leg pain after falling from 12 foot ladder. Found to have spine fracture, left trimalleolar ankle fracture and anterior dislocation of left shoulder. Patient was placed in short leg splint and was admitted to trauma service.  Orthopaedics was consulted for shoulder and ankle injuries. Due to OR scheduling and surgeon availability, Dr. Dion Saucier asked orthopaedic trauma service to assume care for definitive fixation of patient's ankle.   Patient evaluated on 6N this morning. He is now status post fixation of spine fracture by the neurosurgeons. Left short leg splint was re-applied yesterday after patient experienced increased ankle pain and feeling of splint being too tight. He notes improvement in ankle pain now. Main complaints this morning is his back pain. Patient is an avid Pharmacist, community. He lives here in [place], [place]. 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 He has one step to enter his home. Patient is requesting he receive phenergan before being intubated, states he gets very sick when waking up from anesthesia if he does not. He is concerned about how he will be able to mobilize with therapy post-operatively.  Past Medical History:  Diagnosis Date  . Complication of anesthesia   . PONV (postoperative nausea and vomiting)     Past Surgical History:  Procedure Laterality Date  . APPENDECTOMY    . EYE MUSCLE SURGERY    . KNEE SURGERY Right   . LUMBAR SPINE SURGERY  10/16/2019   L1 burst fracture with epidural hematoma and canal stenosis  . TONSILLECTOMY       History reviewed. No pertinent family history.  Social History:  reports that he has never smoked. He has never used smokeless tobacco. He reports current alcohol use. He reports that he does not use drugs.  Allergies:  Allergies  Allergen Reactions  . Penicillins Swelling    Medications: I have reviewed the patient's current medications.  ROS: Constitutional: No fever or chills Vision: No changes in vision ENT: No difficulty swallowing CV: No chest pain Pulm: No SOB or wheezing GI: No nausea or vomiting GU: No urgency or inability to hold urine Skin: No poor wound healing  Neurologic: No numbness or tingling Psychiatric: No depression or anxiety Heme: No bruising Allergic: No reaction to medications or food   Exam: Blood pressure 137/72, pulse (!) 56, temperature 97.9 F (36.6 C), temperature source Oral, resp. rate 18, height 6' (1.829 m), weight 70.3 kg, SpO2 97 %. General: Laying flat in bed, NAD Orientation: Alert and oriented x3 Mood and Affect: Pleasant and cooperative Gait: Not assessed due to known fracture Coordination and balance: Within normal limits  Left Lower Extremity: Short leg splint in place. Non-tender above splint. Able to wiggle toes. Sensation intact to light touch. Toes warm and well perfused. 2+ DP pulse  Right Lower Extremity: Skin without lesions. No tenderness to palpation. Full painless ROM. Motor and sensory function intact. Neurovascularly intact   Medical Decision Making: Data: Imaging: X-ray and CT scan of left ankle show a trimalleolar fracture of the left ankle with varus tilt of the talus relative to the tibial plafond without dislocation.  Additionally, there is a small avulsion fracture from the inferior tip of the lateral malleolus  Labs:  Results for orders placed or performed during the hospital encounter of 10/15/19 (from the past 24 hour(s))  Basic metabolic panel     Status: Abnormal   Collection Time: 10/19/19  1:24 AM   Result Value Ref Range   Sodium 139 135 - 145 mmol/L   Potassium 4.2 3.5 - 5.1 mmol/L   Chloride 103 98 - 111 mmol/L   CO2 27 22 - 32 mmol/L   Glucose, Bld 134 (H) 70 - 99 mg/dL   BUN 21 8 - 23 mg/dL   Creatinine, Ser 0.79 0.61 - 1.24 mg/dL   Calcium 8.6 (L) 8.9 - 10.3 mg/dL   GFR calc non Af Amer >60 >60 mL/min   GFR calc Af Amer >60 >60 mL/min   Anion gap 9 5 - 15  Magnesium     Status: None   Collection Time: 10/19/19  1:24 AM  Result Value Ref Range   Magnesium 2.0 1.7 - 2.4 mg/dL  Phosphorus     Status: None   Collection Time: 10/19/19  1:24 AM  Result Value Ref Range   Phosphorus 3.5 2.5 - 4.6 mg/dL     Assessment/Plan: 63 year old male s/p fall from ladder, resulting in left trimalleolar fracture of the left ankle  Would recommend proceeding with open reduction internal fixation of the left distal tibia/ankle today.  I discussed risks and benefits of the procedure with the patient. Risks discussed included bleeding requiring blood transfusion, bleeding causing a hematoma, infection, malunion, nonunion, damage to surrounding nerves and blood vessels, pain, hardware prominence or irritation, hardware failure, stiffness, post-traumatic arthritis, DVT/PE, compartment syndrome, and anesthesia complications even including death. Patient agrees to proceed with surgery. We will work on a combination case with Dr. Griffin Basil who will be arthroscopically repairing the patient's left shoulder. All questions answered, consent obtained.   Lynelle Weiler A. Carmie Kanner [facility] [phone] (office) [email or web address]

## 2019-10-19 NOTE — Transfer of Care (Signed)
Immediate Anesthesia Transfer of Care Note  Patient: Dedric Ethington  Procedure(s) Performed: OPEN REDUCTION INTERNAL FIXATION (ORIF) TIBIA/FIBULA FRACTURE (Left ) SHOULDER ARTHROSCOPY WITH BANKART REPAIR, EXTENSIVE DEBRIDEMENT, LOOSE BODY REMOVAL (Left Shoulder)  Patient Location: PACU  Anesthesia Type:General  Level of Consciousness: drowsy  Airway & Oxygen Therapy: Patient Spontanous Breathing and Patient connected to face mask oxygen  Post-op Assessment: Report given to RN and Post -op Vital signs reviewed and stable  Post vital signs: Reviewed  Last Vitals:  Vitals Value Taken Time  BP 131/62 10/19/19 1521  Temp    Pulse 56 10/19/19 1526  Resp 11 10/19/19 1526  SpO2 100 % 10/19/19 1526  Vitals shown include unvalidated device data.  Last Pain:  Vitals:   10/19/19 0924  TempSrc:   PainSc: 8       Patients Stated Pain Goal: 3 (37/85/88 5027)  Complications: No apparent anesthesia complications

## 2019-10-19 NOTE — Anesthesia Procedure Notes (Signed)
Procedure Name: Intubation Date/Time: 10/19/2019 11:37 AM Performed by: Janene Harvey, CRNA Pre-anesthesia Checklist: Patient identified, Emergency Drugs available, Suction available and Patient being monitored Patient Re-evaluated:Patient Re-evaluated prior to induction Oxygen Delivery Method: Circle system utilized Preoxygenation: Pre-oxygenation with 100% oxygen Induction Type: IV induction Ventilation: Mask ventilation without difficulty Laryngoscope Size: Glidescope and 3 Grade View: Grade I Tube type: Oral Tube size: 7.5 mm Number of attempts: 1 Airway Equipment and Method: Stylet and Oral airway Placement Confirmation: ETT inserted through vocal cords under direct vision,  positive ETCO2 and breath sounds checked- equal and bilateral Secured at: 22 cm Tube secured with: Tape Dental Injury: Teeth and Oropharynx as per pre-operative assessment

## 2019-10-19 NOTE — Op Note (Signed)
Orthopaedic Surgery Operative Note (CSN: [id number] ) Date of Surgery: 10/15/2019 - 10/19/2019  Admit Date: 10/15/2019   Diagnoses: Pre-Op Diagnoses: Left trimalleolar ankle fracture  Post-Op Diagnosis: Same  Procedures: CPT 27823-Open reduction internal fixation of left trimalleolar ankle fracture  Surgeons : Primary: Roby Lofts, MD  Assistant: Ulyses Southward, PA-C  Location: OR 6   Anesthesia:General  Antibiotics: Ancef 2g preop   Tourniquet time: None  Estimated Blood Loss:25 mL  Complications:None   Specimens:None   Implants: Implant Name Type Inv. Item Serial No. Manufacturer Lot No. LRB No. Used Action  SCREW HEADED ST 3.5X36 - [id number] Screw SCREW HEADED ST 3.5X36  [employer]  Left 1 Implanted  SCREW HEADED ST 3.5X48 - [id number] Screw SCREW HEADED ST 3.5X48  [employer]  Left 2 Implanted  SCREW HEADED ST 3.5X34 - [id number] Screw SCREW HEADED ST 3.5X34  [employer]  Left 1 Implanted  SCREW CORT HEADED ST 3.5X30 - [id number] Screw SCREW CORT HEADED ST 3.5X30  [employer]  Left 1 Implanted  SCREW CORT HEADED ST 3.5X32 - [id number] Screw SCREW CORT HEADED ST 3.5X32  [employer]  Left 1 Implanted  SCREW LOCKING 3.5X16 - [id number] Screw SCREW LOCKING 3.5X16  [employer]  Left 1 Implanted  SCREW HEADED ST 3.5X100 - [id number] Screw SCREW HEADED ST 3.5X100  [employer]  Left 1 Implanted  PLATE TUBULAR Rosana Hoes - [id number] Plate PLATE TUBULAR W/COLLAR  [employer]  Left 1 Implanted     Indications for Surgery: 63 year old male who fell multiple feet from a ladder found to have a spine fracture as well as left trimalleolar ankle fracture and an anterior dislocation of his left shoulder.  He underwent fixation of his spine and it was recommended to undergo open reduction internal fixation to his unstable nature of his ankle injury.  He also had a bony Bankart lesion for which Dr. Everardo Pacific is treating him.  Risks and benefits were discussed with the  patient regarding his left ankle.  Risks included but not limited to bleeding, infection, malunion, nonunion, hardware failure, hardware irritation, stiffness of the ankle, posttraumatic arthritis, ankle instability, nerve and blood vessel injury, DVT, even the possibility anesthetic complications.  The patient agreed to proceed with surgery and consent was obtained.  Operative Findings: Open reduction internal fixation of left trimalleolar ankle fracture using Synthes one third tubular medial buttress plate with independent 3.5 mm lag screws across the joint.  An intramedullary 3.5 millimeter screw to fix the fibula.  As well as a percutaneously placed anterior posterior 3.5 millimeter screw for the posterior malleolus fracture.  Procedure: The patient was identified in the preoperative holding area. Consent was confirmed with the patient and their family and all questions were answered. The operative extremity was marked after confirmation with the patient. he was then brought back to the operating room by our anesthesia colleagues.  He was placed under general anesthetic and carefully transferred over to a radiolucent flat top table.  A bump was placed under his operative hip. The operative extremity was then prepped and draped in usual sterile fashion. A preoperative timeout was performed to verify the patient, the procedure, and the extremity. Preoperative antibiotics were dosed.  Fluoroscopic imaging was used to show the unstable nature of his injury.  A medial approach to the distal tibia was performed it was carried down through skin and subcutaneous tissue.  The neurovascular bundle was protected.  I mobilized this out of the way and perform subperiosteal dissection to visualize  the fracture.  The clot was debrided from the fracture site and a anatomic reduction was obtained and a percutaneous incision was placed for a tine of a clamp.  The fracture was reduced anatomically and a 3.5 mm lag screw  was placed from medial to lateral.  Another positional screw was placed just posterior to this to provisionally hold the fracture.  I removed my clamp and then I contoured a one third tubular plate using buttress screws to hold it in place.  Another nonlocking screw was placed across the the fracture and a locking screw was placed in the distal hole to complete the construct.  I then used fluoroscopy to make a small percutaneous incision and drill and place a 3.5 millimeter screw of the fibula to provide compression of that transverse fibula fracture.  There was still a small posterior malleolus fracture that was moderately displaced.  There is no significant subluxation to the ankle however I felt that fixation of this would be appropriate.  Using AP and lateral fluoroscopic imaging I placed a clamp to reduce the fracture and then I placed a 3.5 millimeter screw from anterior to posterior to hold that for fixation.  Final fluoroscopic imaging was obtained.  A gram of vancomycin powder was placed in the incision after it was thoroughly irrigated.  The skin was then closed with 2-0 Vicryl and 3-0 Monocryl.  Sterile dressing consisting of Steri-Strips, 4 x 4's sterile cast padding and a well-padded short leg splint was placed.  The patient was then transitioned in position for the left upper extremity for Dr. Griffin Basil.  Post Op Plan/Instructions: The patient will be nonweightbearing to the left lower extremity.  He may receive Lovenox for DVT prophylaxis.  He will receive postoperative Ancef for surgical prophylaxis.  We will mobilize him with physical therapy.  I was present and performed the entire surgery.  Patrecia Pace, PA-C did assist me throughout the case. An assistant was necessary given the difficulty in approach, maintenance of reduction and ability to instrument the fracture.   Katha Hamming, MD [facility]

## 2019-10-20 ENCOUNTER — Inpatient Hospital Stay (HOSPITAL_COMMUNITY): Payer: Commercial Managed Care - PPO

## 2019-10-20 LAB — CBC
HCT: 24.3 % — ABNORMAL LOW (ref 39.0–52.0)
Hemoglobin: 7.9 g/dL — ABNORMAL LOW (ref 13.0–17.0)
MCH: 31.3 pg (ref 26.0–34.0)
MCHC: 32.5 g/dL (ref 30.0–36.0)
MCV: 96.4 fL (ref 80.0–100.0)
Platelets: 208 10*3/uL (ref 150–400)
RBC: 2.52 MIL/uL — ABNORMAL LOW (ref 4.22–5.81)
RDW: 12.9 % (ref 11.5–15.5)
WBC: 7 10*3/uL (ref 4.0–10.5)
nRBC: 0 % (ref 0.0–0.2)

## 2019-10-20 LAB — BASIC METABOLIC PANEL
Anion gap: 7 (ref 5–15)
BUN: 22 mg/dL (ref 8–23)
CO2: 26 mmol/L (ref 22–32)
Calcium: 8.1 mg/dL — ABNORMAL LOW (ref 8.9–10.3)
Chloride: 105 mmol/L (ref 98–111)
Creatinine, Ser: 0.87 mg/dL (ref 0.61–1.24)
GFR calc Af Amer: >60 mL/min (ref >60)
GFR calc non Af Amer: >60 mL/min (ref >60)
Glucose, Bld: 136 mg/dL — ABNORMAL HIGH (ref 70–99)
Potassium: 4.6 mmol/L (ref 3.5–5.1)
Sodium: 138 mmol/L (ref 135–145)

## 2019-10-20 LAB — VITAMIN D 25 HYDROXY (VIT D DEFICIENCY, FRACTURES): Vit D, 25-Hydroxy: 35.24 ng/mL (ref 30–100)

## 2019-10-20 MED ORDER — ACETAMINOPHEN 500 MG PO TABS
1000.0000 mg | ORAL_TABLET | Freq: Four times a day (QID) | ORAL | Status: DC
  Administered 2019-10-20 – 2019-10-25 (×19): 1000 mg via ORAL
  Filled 2019-10-20 (×22): qty 2

## 2019-10-20 MED ORDER — MORPHINE SULFATE (PF) 4 MG/ML IV SOLN: 4.0000 mg | INTRAVENOUS | Status: DC | PRN

## 2019-10-20 MED ORDER — MORPHINE SULFATE (PF) 2 MG/ML IV SOLN
2.0000 mg | INTRAVENOUS | Status: DC | PRN
  Administered 2019-10-20 (×4): 4 mg via INTRAVENOUS
  Administered 2019-10-23: 2 mg via INTRAVENOUS
  Filled 2019-10-20 (×4): qty 2
  Filled 2019-10-20: qty 1

## 2019-10-20 MED ORDER — DIAZEPAM 5 MG PO TABS
5.0000 mg | ORAL_TABLET | Freq: Three times a day (TID) | ORAL | Status: DC | PRN
  Administered 2019-10-20 – 2019-10-22 (×2): 5 mg via ORAL
  Filled 2019-10-20 (×2): qty 1

## 2019-10-20 MED ORDER — TRAMADOL HCL 50 MG PO TABS
50.0000 mg | ORAL_TABLET | Freq: Four times a day (QID) | ORAL | Status: DC
  Administered 2019-10-20 – 2019-10-25 (×22): 50 mg via ORAL
  Filled 2019-10-20 (×22): qty 1

## 2019-10-20 MED ORDER — ENOXAPARIN SODIUM 40 MG/0.4ML ~~LOC~~ SOLN
40.0000 mg | SUBCUTANEOUS | Status: DC
  Administered 2019-10-21 – 2019-10-25 (×5): 40 mg via SUBCUTANEOUS
  Filled 2019-10-20 (×5): qty 0.4

## 2019-10-20 NOTE — Progress Notes (Signed)
Orthopaedic Trauma Progress Note  S: Having a lot of pain in his left ankle and back this morning. Nerve block started to wear off of leg around 4AM. Able to wiggle toes, foot still numb. Ankle is throbbing currently, feels like pain meds are not really helping. I loosened ace wraps on splint this morning to hopefully provide some relief. Spoke with trauma team, they are making some pain medication adjustments this morning as well. Vitamin D level drawn post-op was within normal limits.  O:  Vitals:   10/20/19 0117 10/20/19 0634  BP: (!) 155/82 (!) 181/82  Pulse: (!) 57 63  Resp: 17 18  Temp: 98.6 F (37 C) 98.4 F (36.9 C)  SpO2: 100% 99%    General - Sitting up in bed, NAD. Pleasant and cooperative  Left Lower Extremity - Splint in place, is clean, dry, intact. Loosened ace wraps some. Able to wiggle toes, sensation intact to light touch of toes. Diminished sensation over dorsal and plantar aspect of foot, nerve block not fully worn off. Foot warm and well perfused. Compartments soft and compressible  Imaging: Stable post op imaging.   Labs:  Results for orders placed or performed during the hospital encounter of 10/15/19 (from the past 24 hour(s))  CBC     Status: Abnormal   Collection Time: 10/20/19  2:05 AM  Result Value Ref Range   WBC 7.0 4.0 - 10.5 K/uL   RBC 2.52 (L) 4.22 - 5.81 MIL/uL   Hemoglobin 7.9 (L) 13.0 - 17.0 g/dL   HCT 24.3 (L) 39.0 - 52.0 %   MCV 96.4 80.0 - 100.0 fL   MCH 31.3 26.0 - 34.0 pg   MCHC 32.5 30.0 - 36.0 g/dL   RDW 12.9 11.5 - 15.5 %   Platelets 208 150 - 400 K/uL   nRBC 0.0 0.0 - 0.2 %  VITAMIN D 25 Hydroxy (Vit-D Deficiency, Fractures)     Status: None   Collection Time: 10/20/19  2:05 AM  Result Value Ref Range   Vit D, 25-Hydroxy 35.24 30 - 100 ng/mL  Basic metabolic panel     Status: Abnormal   Collection Time: 10/20/19  4:11 AM  Result Value Ref Range   Sodium 138 135 - 145 mmol/L   Potassium 4.6 3.5 - 5.1 mmol/L   Chloride 105 98 -  111 mmol/L   CO2 26 22 - 32 mmol/L   Glucose, Bld 136 (H) 70 - 99 mg/dL   BUN 22 8 - 23 mg/dL   Creatinine, Ser 0.87 0.61 - 1.24 mg/dL   Calcium 8.1 (L) 8.9 - 10.3 mg/dL   GFR calc non Af Amer >60 >60 mL/min   GFR calc Af Amer >60 >60 mL/min   Anion gap 7 5 - 15    Assessment: 63 year old male s/p fall from ladder  Injuries: 1. Left trimalleolar ankle fracture s/p ORIF  Weightbearing: NWB LLE  Insicional and dressing care: Keep splint in place, keep clean and dry.   Orthopedic device(s): Splint LLE  CV/Blood loss: Acute blood loss anemia, Hgb 7.9. Continue to monitor CBC  Pain management: per trauma  VTE prophylaxis: Okay to start Lovenox from ortho standpoint once cleared by trauma team and neurosurgery. Likely start tomorrow  ID: Clindamycin post op  Foley/Lines: Foley, KVO IVFs  Medical co-morbidities: None   Impediments to Fracture Healing: polytrauma  Dispo: PT/OT eval, dispo pending  Follow - up plan: Continue to follow while in hospital, plan for outpatient follow-up 2  weeks after discharge.  Contact information:  Dr. Truitt Merle MD, Ulyses Southward PA-C   Erika Hussar A. Ladonna Snide [facility] [phone] (office) [email or web address]

## 2019-10-20 NOTE — Progress Notes (Signed)
Upon passing meds and rounding on patient, patient complain of severe back pain significantly worse than past two nights. This nurse assessed capillary refill ,3 seconds and pedal pulse strong 2+. Patient requested this nurse reach out to Dr on call and make aware of extreme back pain that is worse than before. This nurse provided pain medication and helped reposition patient. Paged Dr. Annye English on call and made aware

## 2019-10-20 NOTE — Progress Notes (Signed)
This nurse spoke with Dr. Dema Severin in regards to patients back pain. Will closely monitor patients pain and keep up with pain medication as needed.

## 2019-10-20 NOTE — Progress Notes (Signed)
Physical Therapy Treatment Patient Details Name: Louis Jordan MRN: [id number] DOB: 24-Mar-1956 Today's Date: 10/20/2019    History of Present Illness Admitted after fall from ladder resulting in L1 burst fracture, now s/p lumbar surgery for fixation (clamshell brace, may don sitting), L ankle comminuted fracture s/p ORIF 11/4, and L shoulder dislocation s/p L arthroscopic labral and capsular repair, loose body removal, and debridement 11/4; Lumbar surgery 11/1    PT Comments    Pt with moderate pain in back, LLE, and L shoulder today, but pt eager to mobilize with PT. Pt with improved sitting balance and tolerance for activity today, and continues to be very conscious of all precautions. PT reinforced back precautions pre, during, and post-mobility, pt with good understanding and application. Today's session limited to EOB activity only, and spinal brace was not applied as pt states he cannot don spinal brace with L shoulder sling per MD verbal order. PT following up with this to clarify. PT continuing to recommend CIR post-acutely to maximize pt recovery, as pt is very motivated.    Follow Up Recommendations  CIR     Equipment Recommendations  Other (comment)(tbd next venue)    Recommendations for Other Services Rehab consult     Precautions / Restrictions Precautions Precautions: Fall;Back Precaution Comments: L shoulder dislocation - maintain sling; PT verbally reviewed no bending, lifting, twisting, arching spine Required Braces or Orthoses: Sling;Spinal Brace Spinal Brace: Thoracolumbosacral orthotic;Applied in sitting position Restrictions Weight Bearing Restrictions: Yes LUE Weight Bearing: Non weight bearing LLE Weight Bearing: Non weight bearing    Mobility  Bed Mobility Overal bed mobility: Needs Assistance Bed Mobility: Rolling;Supine to Sit;Sit to Supine Rolling: Mod assist;+2 for safety/equipment   Supine to sit: Mod assist;+2 for physical assistance;+2 for  safety/equipment Sit to supine: Mod assist;+2 for physical assistance;+2 for safety/equipment   General bed mobility comments: mod assist for rolling towards R, assist for moving LEs and trunk in concert to avoid twisting. Rolling towards R x2, in preparation for OOB and for bed pad placement underneath pt. Pt able to lift buttocks off bed very minimally with use of RLE to straighten bed pad, neutral spine maintained well. Mod assist +2 for sidelying<>sit for trunk and LE management, lines/leads management, and ensuring maintenance of shoulder and trunk alignment to maintain all precautions. Pt sat EOB ~15 minutes, performed gentle LE movement with frequent VC for upright posture and neutral spine.  Transfers Overall transfer level: (NT)               General transfer comment: not attempted  Ambulation/Gait                 Stairs             Wheelchair Mobility    Modified Rankin (Stroke Patients Only)       Balance Overall balance assessment: Needs assistance Sitting-balance support: Feet supported;Single extremity supported Sitting balance-Leahy Scale: Fair Sitting balance - Comments: able to sit EOB and perform LE movement without LOB, no external support needed                                    Cognition Arousal/Alertness: Awake/alert Behavior During Therapy: WFL for tasks assessed/performed Overall Cognitive Status: Within Functional Limits for tasks assessed  Exercises General Exercises - Lower Extremity Short Arc Quad: AROM;Both;5 reps;Seated(limited ROM to avoid neural tension)    General Comments        Pertinent Vitals/Pain Pain Assessment: Faces Faces Pain Scale: Hurts little more Pain Location: back Pain Descriptors / Indicators: Constant;Grimacing;Guarding;Sore Pain Intervention(s): Limited activity within patient's tolerance;Monitored during session;Premedicated  before session;Repositioned    Home Living                      Prior Function            PT Goals (current goals can now be found in the care plan section) Acute Rehab PT Goals Patient Stated Goal: "move the right way" PT Goal Formulation: With patient Time For Goal Achievement: 11/01/19 Potential to Achieve Goals: Good Progress towards PT goals: Progressing toward goals    Frequency    Min 5X/week      PT Plan Current plan remains appropriate    Co-evaluation              AM-PAC PT "6 Clicks" Mobility   Outcome Measure  Help needed turning from your back to your side while in a flat bed without using bedrails?: A Lot Help needed moving from lying on your back to sitting on the side of a flat bed without using bedrails?: A Lot Help needed moving to and from a bed to a chair (including a wheelchair)?: A Lot Help needed standing up from a chair using your arms (e.g., wheelchair or bedside chair)?: A Lot Help needed to walk in hospital room?: Total Help needed climbing 3-5 steps with a railing? : Total 6 Click Score: 10    End of Session Equipment Utilized During Treatment: Other (comment) Activity Tolerance: Patient limited by pain;Patient limited by fatigue Patient left: in bed;with bed alarm set;with call bell/phone within reach;with family/visitor present Nurse Communication: Mobility status PT Visit Diagnosis: Muscle weakness (generalized) (M62.81);Pain Pain - Right/Left: Left Pain - part of body: Shoulder;Leg(and low back)     Time: 1062-6948 PT Time Calculation (min) (ACUTE ONLY): 32 min  Charges:  $Therapeutic Activity: 23-37 mins                     Quinntin Malter E, PT Acute Rehabilitation Services Pager [phone]  Office [phone]   Dhairya Corales D Karam Dunson 10/20/2019, 4:28 PM

## 2019-10-20 NOTE — Progress Notes (Signed)
Inpatient Rehabilitation-Admissions Coordinator   CIR consult received. Will await therapy assessment at this time as pt now POD 1 from left shoulder surgery and requiring med adjustment for pain control.   Jhonnie Garner, OTR/L  Rehab Admissions Coordinator  [phone] 10/20/2019 12:38 PM

## 2019-10-20 NOTE — Progress Notes (Signed)
ORTHOPAEDIC PROGRESS NOTE  s/p Procedure(s):  Left SHOULDER ARTHROSCOPY WITH BANKART REPAIR, EXTENSIVE DEBRIDEMENT, LOOSE BODY REMOVAL  SUBJECTIVE: Reports mild pain about operative site. States the shoulder feels better than before surgery and hurts less than his ankle and back currently.   OBJECTIVE:  PE:LUE dressing intact, +AIN/PIN/Ulnar motor function, sensory intact.  WWP fingers.    Vitals:   10/20/19 0117 10/20/19 0634  BP: (!) 155/82 (!) 181/82  Pulse: (!) 57 63  Resp: 17 18  Temp: 98.6 F (37 C) 98.4 F (36.9 C)  SpO2: 100% 99%     ASSESSMENT: Louis Jordan is a 63 y.o. male doing well postoperatively.  PLAN: Weightbearing: NWB LUE for 6 weeks, ok for wrist and hand ROM but highly unstable shoulder and cannot perform motion at shoulder and for now no more than gentle stretching of elbow Insicional and dressing care: OK to remove dressings 11/6 and leave open to air with dry gauze PRN , keep steri strips in place Orthopedic device(s): Sling with abduction pillow.   Showering: OK to bathe but arm needs to stay in position VTE prophylaxis: not indicated from shoulder, defer to other providers.  Pain control: prn meds Follow - up plan: 1 week w Griffin Basil in clinic or if still inpatient we will perform clinic visit in house. Contact information:  Weekdays 8-5 Noemi Chapel PA-C [phone], After hours and holidays please check [email or web address] for group call information for [facility]

## 2019-10-20 NOTE — Progress Notes (Signed)
Patient ID: Louis Jordan, male   DOB: Aug 10, 1956, 63 y.o.   MRN: [id number]    1 Day Post-Op  Subjective: Patient having a lot of pain this morning that isn't well controlled.  His back is hurting him more today than it has been.  He is anxious about his back.  He is anxious about how he is going to get his TLSO brace on with his sling, etc.  Hungry and wants to eat.  ROS: See above, otherwise other systems negative  Objective: Vital signs in last 24 hours: Temp:  [97.5 F (36.4 C)-98.6 F (37 C)] 98.4 F (36.9 C) (11/05 0634) Pulse Rate:  [56-70] 63 (11/05 0634) Resp:  [9-18] 18 (11/05 0634) BP: (131-181)/(62-90) 181/82 (11/05 0634) SpO2:  [94 %-100 %] 99 % (11/05 0634) Weight:  [70.3 kg] 70.3 kg (11/04 1018) Last BM Date: 10/15/19  Intake/Output from previous day: 11/04 0701 - 11/05 0700 In: 1500 [I.V.:1500] Out: 2325 [Urine:2150; Drains:100; Blood:75] Intake/Output this shift: No intake/output data recorded.  PE: Gen: NAD Heart: regular Lungs: CTAB Abd: soft, NT, Nd, +BS GU: foley in place Ext: LUE dressing in place and clean.  Wiggles fingers with good radial pulse.  LLE in splint.  Still with some toe numbness but feels my touch and wiggles toes.  Davol drain in place in back with about 60cc just emptied he states.  Lab Results:  Recent Labs    10/18/19 0412 10/20/19 0205  WBC 9.5 7.0  HGB 9.4* 7.9*  HCT 28.2* 24.3*  PLT 186 208   BMET Recent Labs    10/19/19 0124 10/20/19 0411  NA 139 138  K 4.2 4.6  CL 103 105  CO2 27 26  GLUCOSE 134* 136*  BUN 21 22  CREATININE 0.79 0.87  CALCIUM 8.6* 8.1*   PT/INR No results for input(s): LABPROT, INR in the last 72 hours. CMP     Component Value Date/Time   NA 138 10/20/2019 0411   K 4.6 10/20/2019 0411   CL 105 10/20/2019 0411   CO2 26 10/20/2019 0411   GLUCOSE 136 (H) 10/20/2019 0411   BUN 22 10/20/2019 0411   CREATININE 0.87 10/20/2019 0411   CALCIUM 8.1 (L) 10/20/2019 0411   PROT 6.1 (L) 10/15/2019  1556   ALBUMIN 3.9 10/15/2019 1556   AST 36 10/15/2019 1556   ALT 28 10/15/2019 1556   ALKPHOS 53 10/15/2019 1556   BILITOT 0.6 10/15/2019 1556   GFRNONAA >60 10/20/2019 0411   GFRAA >60 10/20/2019 0411   Lipase  No results found for: LIPASE     Studies/Results: Dg Ankle 2 Views Left  Result Date: 10/19/2019 CLINICAL DATA:  63 year old male with open reduction internal fixation of left ankle fracture EXAM: LEFT ANKLE - 2 VIEW; DG C-ARM 1-60 MIN COMPARISON:  None. FINDINGS: Limited intraoperative fluoroscopic spot images. Nine below plate and screw fixation of distal tibia fracture with screw fixation of distal fibular fracture. Final images demonstrate anatomic alignment maintained with no complicating features. IMPRESSION: Limited intraoperative fluoroscopic spot images of ORIF of left ankle fractures. Please refer to the dictated operative report for full details of intraoperative findings and procedure. Electronically Signed   By: Corrie Mckusick D.O.   On: 10/19/2019 14:52   Dg Ankle Complete Left  Result Date: 10/19/2019 CLINICAL DATA:  Post ankle fixation EXAM: LEFT ANKLE COMPLETE - 3+ VIEW COMPARISON:  Earlier same day FINDINGS: Post plate and screw fixation of distal tibial fracture and screw fixation of distal fibular fracture.  Alignment is near anatomic. IMPRESSION: Near anatomic alignment post fixation of distal left tibial and fibular fractures. Electronically Signed   By: Guadlupe Spanish M.D.   On: 10/19/2019 16:26   Dg C-arm 1-60 Min  Result Date: 10/19/2019 CLINICAL DATA:  63 year old male with open reduction internal fixation of left ankle fracture EXAM: LEFT ANKLE - 2 VIEW; DG C-ARM 1-60 MIN COMPARISON:  None. FINDINGS: Limited intraoperative fluoroscopic spot images. Nine below plate and screw fixation of distal tibia fracture with screw fixation of distal fibular fracture. Final images demonstrate anatomic alignment maintained with no complicating features. IMPRESSION:  Limited intraoperative fluoroscopic spot images of ORIF of left ankle fractures. Please refer to the dictated operative report for full details of intraoperative findings and procedure. Electronically Signed   By: Gilmer Mor D.O.   On: 10/19/2019 14:52    Anti-infectives: Anti-infectives (From admission, onward)   Start     Dose/Rate Route Frequency Ordered Stop   10/19/19 1930  clindamycin (CLEOCIN) IVPB 900 mg    Note to Pharmacy: Please give 8 hours from intraoperative dose   900 mg 100 mL/hr over 30 Minutes Intravenous Every 8 hours 10/19/19 1755 10/20/19 1929   10/19/19 1247  vancomycin (VANCOCIN) powder  Status:  Discontinued       As needed 10/19/19 1248 10/19/19 1515   10/19/19 1130  clindamycin (CLEOCIN) IVPB 900 mg     900 mg 100 mL/hr over 30 Minutes Intravenous On call to O.R. 10/19/19 0712 10/19/19 1217   10/16/19 2130  vancomycin (VANCOCIN) IVPB 1000 mg/200 mL premix  Status:  Discontinued     1,000 mg 200 mL/hr over 60 Minutes Intravenous  Once 10/16/19 1704 10/16/19 1737   10/16/19 2130  vancomycin (VANCOCIN) IVPB 1000 mg/200 mL premix  Status:  Discontinued     1,000 mg 200 mL/hr over 60 Minutes Intravenous Every 12 hours 10/16/19 1737 10/17/19 2021   10/16/19 1342  vancomycin (VANCOCIN) powder  Status:  Discontinued       As needed 10/16/19 1342 10/16/19 1431   10/16/19 1049  bacitracin 50,000 Units in sodium chloride 0.9 % 500 mL irrigation  Status:  Discontinued       As needed 10/16/19 1049 10/16/19 1431   10/16/19 0922  vancomycin (VANCOCIN) 1-5 GM/200ML-% IVPB    Note to Pharmacy: Launa Flight   : cabinet override      10/16/19 0922 10/16/19 1740       Assessment/Plan Fall from ladder L 1 burst fracture- fusion 11/1 Yetta Barre), now with peroneal numbness and urinary retention.Foley in place.On decadron per neuro.  More back pain today.  Spoke with NS, will order AP/lat films.   L anterior shoulder dislocation-OR 11/4, Dr. Everardo Pacific for labral repair and  capsulorrhaphy.  Must stay in sling in abducted position L ankle trimalleolar fracture- OR 11/4, Dr. Jena Gauss for ORIF, NWB FEN -regular diet Foley - In place for urinary retention VTE -SCD, Lovenox can be started 5 days post back surgery per NS, 11/6 ID -van11/1-11/2 for back, Cleocin for 3 doses per ortho  Dispo: PT/OT, CIR consult, pain control an issue today as expected.  Lots of adjustments made to meds today for multi-modal pain control.   LOS: 5 days    Letha Cape , [facility] 10/20/2019, 8:33 AM Please see Amion for pager number during day hours 7:00am-4:30pm

## 2019-10-21 LAB — CBC
HCT: 33.2 % — ABNORMAL LOW (ref 39.0–52.0)
Hemoglobin: 11.2 g/dL — ABNORMAL LOW (ref 13.0–17.0)
MCH: 31.4 pg (ref 26.0–34.0)
MCHC: 33.7 g/dL (ref 30.0–36.0)
MCV: 93 fL (ref 80.0–100.0)
Platelets: 282 10*3/uL (ref 150–400)
RBC: 3.57 MIL/uL — ABNORMAL LOW (ref 4.22–5.81)
RDW: 12.8 % (ref 11.5–15.5)
WBC: 10.2 10*3/uL (ref 4.0–10.5)
nRBC: 0 % (ref 0.0–0.2)

## 2019-10-21 MED ORDER — POLYETHYLENE GLYCOL 3350 17 G PO PACK
17.0000 g | PACK | Freq: Every day | ORAL | Status: DC
  Administered 2019-10-21 – 2019-10-23 (×3): 17 g via ORAL
  Filled 2019-10-21 (×3): qty 1

## 2019-10-21 NOTE — Progress Notes (Signed)
LSO brace delivered to bedside and applied by [facility] staff. Theodis Aguas gave RN phone number for future needs [phone].

## 2019-10-21 NOTE — Plan of Care (Signed)

## 2019-10-21 NOTE — Progress Notes (Signed)
Subjective: Patient reports some improvement in perineal sensation, some back pain, no leg NTW  Objective: Vital signs in last 24 hours: Temp:  [98.2 F (36.8 C)-98.4 F (36.9 C)] 98.2 F (36.8 C) (11/06 0542) Pulse Rate:  [51-59] 54 (11/06 0542) Resp:  [17-18] 18 (11/06 0542) BP: (147-182)/(83-95) 182/95 (11/06 0542) SpO2:  [95 %-97 %] 97 % (11/06 0542)  Intake/Output from previous day: 11/05 0701 - 11/06 0700 In: -  Out: 2840 [Urine:2800; Drains:40] Intake/Output this shift: No intake/output data recorded.  Neurologic: Grossly normal with cast L LE, incision CDI  Lab Results: Lab Results  Component Value Date   WBC 7.0 10/20/2019   HGB 7.9 (L) 10/20/2019   HCT 24.3 (L) 10/20/2019   MCV 96.4 10/20/2019   PLT 208 10/20/2019   Lab Results  Component Value Date   INR 1.1 10/15/2019   BMET Lab Results  Component Value Date   NA 138 10/20/2019   K 4.6 10/20/2019   CL 105 10/20/2019   CO2 26 10/20/2019   GLUCOSE 136 (H) 10/20/2019   BUN 22 10/20/2019   CREATININE 0.87 10/20/2019   CALCIUM 8.1 (L) 10/20/2019    Studies/Results: Dg Thoracolumabar Spine  Result Date: 10/20/2019 CLINICAL DATA:  63 year old male with back pain. History of prior spinal surgery and trauma. EXAM: THORACOLUMBAR SPINE 1V COMPARISON:  Fluoroscopy image dated 10/16/2019. FINDINGS: T10-L4 posterior fixation rods and transpedicular screws. There is compression fracture of L1 with approximately 30% loss of vertebral body height similar or slightly progressed since the prior study. No new fractures. The fixation hardware appears intact. The bones are osteopenic. There is grade 1 L1-L2 retrolisthesis. The soft tissues are unremarkable. Support tubes noted. IMPRESSION: 1. Stable or slightly progressed compression fracture of L1. No new fracture. 2. Stable appearance of the posterior fusion hardware of T10-L4. Electronically Signed   By: Anner Crete M.D.   On: 10/20/2019 10:08   Dg Ankle 2 Views  Left  Result Date: 10/19/2019 CLINICAL DATA:  63 year old male with open reduction internal fixation of left ankle fracture EXAM: LEFT ANKLE - 2 VIEW; DG C-ARM 1-60 MIN COMPARISON:  None. FINDINGS: Limited intraoperative fluoroscopic spot images. Nine below plate and screw fixation of distal tibia fracture with screw fixation of distal fibular fracture. Final images demonstrate anatomic alignment maintained with no complicating features. IMPRESSION: Limited intraoperative fluoroscopic spot images of ORIF of left ankle fractures. Please refer to the dictated operative report for full details of intraoperative findings and procedure. Electronically Signed   By: Corrie Mckusick D.O.   On: 10/19/2019 14:52   Dg Ankle Complete Left  Result Date: 10/19/2019 CLINICAL DATA:  Post ankle fixation EXAM: LEFT ANKLE COMPLETE - 3+ VIEW COMPARISON:  Earlier same day FINDINGS: Post plate and screw fixation of distal tibial fracture and screw fixation of distal fibular fracture. Alignment is near anatomic. IMPRESSION: Near anatomic alignment post fixation of distal left tibial and fibular fractures. Electronically Signed   By: Macy Mis M.D.   On: 10/19/2019 16:26   Dg C-arm 1-60 Min  Result Date: 10/19/2019 CLINICAL DATA:  63 year old male with open reduction internal fixation of left ankle fracture EXAM: LEFT ANKLE - 2 VIEW; DG C-ARM 1-60 MIN COMPARISON:  None. FINDINGS: Limited intraoperative fluoroscopic spot images. Nine below plate and screw fixation of distal tibia fracture with screw fixation of distal fibular fracture. Final images demonstrate anatomic alignment maintained with no complicating features. IMPRESSION: Limited intraoperative fluoroscopic spot images of ORIF of left ankle fractures. Please refer  to the dictated operative report for full details of intraoperative findings and procedure. Electronically Signed   By: Gilmer Mor D.O.   On: 10/19/2019 14:52    Assessment/Plan: try LSO brace since  ortho injuries/ sling inhibit use of TLSO  Mobilize in brace  Xrays look good  conus injury/ urinary retention - continue foley  Estimated body mass index is 21.02 kg/m as calculated from the following:   Height as of this encounter: 6' (1.829 m).   Weight as of this encounter: 70.3 kg.    LOS: 6 days    Tia Alert 10/21/2019, 7:42 AM

## 2019-10-21 NOTE — Progress Notes (Signed)
Orthopaedic Trauma Progress Note  S: Doing well this morning, ankle pain much improved form yesterday. States this is the best he has felt since he has been here. Was able to sit on edge of bed yesterday with therapy. No questions this AM  O:  Vitals:   10/20/19 2206 10/21/19 0542  BP: (!) 158/83 (!) 182/95  Pulse: (!) 51 (!) 54  Resp: 17 18  Temp: 98.3 F (36.8 C) 98.2 F (36.8 C)  SpO2: 95% 97%    General - Laying in bed, NAD. Pleasant and cooperative  Left Lower Extremity - Splint in place, is clean, dry, intact. Able to wiggle toes, sensation intact to light touch of toes and forefoot. Foot warm and well perfused. Compartments soft and compressible  Imaging: Stable post op imaging.   Labs:  No results found for this or any previous visit (from the past 24 hour(s)).  Assessment: 63 year old male s/p fall from ladder  Injuries: 1. Left trimalleolar ankle fracture s/p ORIF  Weightbearing: NWB LLE  Insicional and dressing care: Keep splint in place, keep clean and dry.   Orthopedic device(s): Splint LLE  CV/Blood loss: Acute blood loss anemia, Hgb 7.9 yesterday, CBC pending this morning. Continue to monitor CBC  Pain management: per trauma  VTE prophylaxis: Lovenox starting today  ID: Clindamycin post op cpmpleted  Foley/Lines: Foley, KVO IVFs  Medical co-morbidities: None   Impediments to Fracture Healing: polytrauma  Dispo: PT/OT eval, recommending CIR  Follow - up plan: Continue to follow while in hospital, plan for outpatient follow-up 2 weeks after discharge.  Contact information:  Dr. Katha Hamming MD, Patrecia Pace PA-C   Kemesha Mosey [facility] [phone] (office) [email or web address]

## 2019-10-21 NOTE — Progress Notes (Signed)
Per Saverio Danker, PA okay to follow previous TLSO brace restrictions. Will continue to monitor.

## 2019-10-21 NOTE — Progress Notes (Signed)
ORTHOPAEDIC PROGRESS NOTE  s/p Procedure(s): OPEN REDUCTION INTERNAL FIXATION (ORIF) TIBIA/FIBULA FRACTURE SHOULDER ARTHROSCOPY WITH BANKART REPAIR, EXTENSIVE DEBRIDEMENT, LOOSE BODY REMOVAL  SUBJECTIVE: He reports mild pain of his left shoulder. He was just placed in a new LSO brace and reports this is very uncomfortable. His wife is at his bedside. He was very drowsy after receiving his recent dose of pain medications.    OBJECTIVE: PE: LUE dressing removed - incisions are CDI, sling well fitting, +AIN/PIN/Ulnar motor function, sensory intact.  WWP fingers.     Vitals:   10/21/19 1003 10/21/19 1444  BP: 136/73 (!) 151/86  Pulse: 65 (!) 57  Resp: 16 18  Temp: 98.6 F (37 C) 98.4 F (36.9 C)  SpO2: 98% 99%     ASSESSMENT: Louis Jordan is a 63 y.o. male doing reasonable postoperatively.  PLAN: Weightbearing: NWB LUE for 6 weeks, ok for wrist and hand ROM but highly unstable shoulder and cannot perform motion at shoulder and for now no more than gentle stretching of elbow Insicional and dressing care: Dressing removed today. Incisions can be left open to air with dry gauze PRN, leave steri-strips in palce Orthopedic device(s): Sling with abduction pillow Showering: Ok to bathe but arm needs to stay in position VTE prophylaxis: per trauma/NS recommendations Pain control: per trauma Follow - up plan: 1 week with Dr. Griffin Basil in clinic or if still inpatient we will perform clinic visit in house.   Noemi Chapel, PA-C

## 2019-10-21 NOTE — Progress Notes (Addendum)
Patient ID: Louis Jordan, male   DOB: 1956/11/03, 63 y.o.   MRN: [id number]    2 Days Post-Op  Subjective: Pain better controlled today.  L ankle feeling much better.  Some slight return of sensation to perineum.  Back still hurts some, but stable.  Eating well.  Getting fitted for a special LSO brace to work with sling position as well.  ROS: See above, otherwise other systems negative  Objective: Vital signs in last 24 hours: Temp:  [98.2 F (36.8 C)-98.4 F (36.9 C)] 98.2 F (36.8 C) (11/06 0542) Pulse Rate:  [51-59] 54 (11/06 0542) Resp:  [17-18] 18 (11/06 0542) BP: (147-182)/(83-95) 182/95 (11/06 0542) SpO2:  [95 %-97 %] 97 % (11/06 0542) Last BM Date: 10/15/19  Intake/Output from previous day: 11/05 0701 - 11/06 0700 In: -  Out: 2840 [Urine:2800; Drains:40] Intake/Output this shift: Total I/O In: -  Out: 700 [Urine:700]  PE: Gen: NAD Heart: regular Lungs: CTAB Abd: soft, NT, ND, +BS GU: foley in place with clear yellow urine Ext: LUE with dressing in place, c/d/i.  Splint in place to left ankle.  Wiggles toes with normal sensation.  Davol removed from back  Lab Results:  Recent Labs    10/20/19 0205  WBC 7.0  HGB 7.9*  HCT 24.3*  PLT 208   BMET Recent Labs    10/19/19 0124 10/20/19 0411  NA 139 138  K 4.2 4.6  CL 103 105  CO2 27 26  GLUCOSE 134* 136*  BUN 21 22  CREATININE 0.79 0.87  CALCIUM 8.6* 8.1*   PT/INR No results for input(s): LABPROT, INR in the last 72 hours. CMP     Component Value Date/Time   NA 138 10/20/2019 0411   K 4.6 10/20/2019 0411   CL 105 10/20/2019 0411   CO2 26 10/20/2019 0411   GLUCOSE 136 (H) 10/20/2019 0411   BUN 22 10/20/2019 0411   CREATININE 0.87 10/20/2019 0411   CALCIUM 8.1 (L) 10/20/2019 0411   PROT 6.1 (L) 10/15/2019 1556   ALBUMIN 3.9 10/15/2019 1556   AST 36 10/15/2019 1556   ALT 28 10/15/2019 1556   ALKPHOS 53 10/15/2019 1556   BILITOT 0.6 10/15/2019 1556   GFRNONAA >60 10/20/2019 0411   GFRAA >60  10/20/2019 0411   Lipase  No results found for: LIPASE     Studies/Results: Dg Thoracolumabar Spine  Result Date: 10/20/2019 CLINICAL DATA:  63 year old male with back pain. History of prior spinal surgery and trauma. EXAM: THORACOLUMBAR SPINE 1V COMPARISON:  Fluoroscopy image dated 10/16/2019. FINDINGS: T10-L4 posterior fixation rods and transpedicular screws. There is compression fracture of L1 with approximately 30% loss of vertebral body height similar or slightly progressed since the prior study. No new fractures. The fixation hardware appears intact. The bones are osteopenic. There is grade 1 L1-L2 retrolisthesis. The soft tissues are unremarkable. Support tubes noted. IMPRESSION: 1. Stable or slightly progressed compression fracture of L1. No new fracture. 2. Stable appearance of the posterior fusion hardware of T10-L4. Electronically Signed   By: Elgie Collard M.D.   On: 10/20/2019 10:08   Dg Ankle 2 Views Left  Result Date: 10/19/2019 CLINICAL DATA:  63 year old male with open reduction internal fixation of left ankle fracture EXAM: LEFT ANKLE - 2 VIEW; DG C-ARM 1-60 MIN COMPARISON:  None. FINDINGS: Limited intraoperative fluoroscopic spot images. Nine below plate and screw fixation of distal tibia fracture with screw fixation of distal fibular fracture. Final images demonstrate anatomic alignment maintained with no complicating  features. IMPRESSION: Limited intraoperative fluoroscopic spot images of ORIF of left ankle fractures. Please refer to the dictated operative report for full details of intraoperative findings and procedure. Electronically Signed   By: Gilmer MorJaime  Wagner D.O.   On: 10/19/2019 14:52   Dg Ankle Complete Left  Result Date: 10/19/2019 CLINICAL DATA:  Post ankle fixation EXAM: LEFT ANKLE COMPLETE - 3+ VIEW COMPARISON:  Earlier same day FINDINGS: Post plate and screw fixation of distal tibial fracture and screw fixation of distal fibular fracture. Alignment is near  anatomic. IMPRESSION: Near anatomic alignment post fixation of distal left tibial and fibular fractures. Electronically Signed   By: Guadlupe SpanishPraneil  Patel M.D.   On: 10/19/2019 16:26   Dg C-arm 1-60 Min  Result Date: 10/19/2019 CLINICAL DATA:  63 year old male with open reduction internal fixation of left ankle fracture EXAM: LEFT ANKLE - 2 VIEW; DG C-ARM 1-60 MIN COMPARISON:  None. FINDINGS: Limited intraoperative fluoroscopic spot images. Nine below plate and screw fixation of distal tibia fracture with screw fixation of distal fibular fracture. Final images demonstrate anatomic alignment maintained with no complicating features. IMPRESSION: Limited intraoperative fluoroscopic spot images of ORIF of left ankle fractures. Please refer to the dictated operative report for full details of intraoperative findings and procedure. Electronically Signed   By: Gilmer MorJaime  Wagner D.O.   On: 10/19/2019 14:52    Anti-infectives: Anti-infectives (From admission, onward)   Start     Dose/Rate Route Frequency Ordered Stop   10/19/19 1930  clindamycin (CLEOCIN) IVPB 900 mg    Note to Pharmacy: Please give 8 hours from intraoperative dose   900 mg 100 mL/hr over 30 Minutes Intravenous Every 8 hours 10/19/19 1755 10/20/19 1251   10/19/19 1247  vancomycin (VANCOCIN) powder  Status:  Discontinued       As needed 10/19/19 1248 10/19/19 1515   10/19/19 1130  clindamycin (CLEOCIN) IVPB 900 mg     900 mg 100 mL/hr over 30 Minutes Intravenous On call to O.R. 10/19/19 0712 10/19/19 1217   10/16/19 2130  vancomycin (VANCOCIN) IVPB 1000 mg/200 mL premix  Status:  Discontinued     1,000 mg 200 mL/hr over 60 Minutes Intravenous  Once 10/16/19 1704 10/16/19 1737   10/16/19 2130  vancomycin (VANCOCIN) IVPB 1000 mg/200 mL premix  Status:  Discontinued     1,000 mg 200 mL/hr over 60 Minutes Intravenous Every 12 hours 10/16/19 1737 10/17/19 2021   10/16/19 1342  vancomycin (VANCOCIN) powder  Status:  Discontinued       As needed  10/16/19 1342 10/16/19 1431   10/16/19 1049  bacitracin 50,000 Units in sodium chloride 0.9 % 500 mL irrigation  Status:  Discontinued       As needed 10/16/19 1049 10/16/19 1431   10/16/19 0922  vancomycin (VANCOCIN) 1-5 GM/200ML-% IVPB    Note to Pharmacy: Launa Flightavy, Adrian   : cabinet override      10/16/19 0922 10/16/19 1740       Assessment/Plan Fall from ladder L 1 burst fracture- fusion 11/1 Yetta Barre(Jones), now with peroneal numbness and urinary retention.Foley in place.On decadron per neuro.  LSO brace being fitted just for him.  Mobilize once he gets his brace. PT/OT L anterior shoulder dislocation-OR 11/4, Dr. Everardo PacificVarkey for labral repair and capsulorrhaphy.  Must stay in sling in abducted position L ankle trimalleolar fracture-OR 11/4, Dr. Jena GaussHaddix for ORIF, NWB, PT/OT ABL anemia - hgb 7.9 yesterday.  Will check tomorrow after initiation of lovenox FEN -regular diet, colace, miralax added Foley -  In place for urinary retention, will continue for now VTE -SCD,Lovenox started today POD 5 from NS ID -van11/1-11/2 for back, Cleocin for 3 doses per ortho  Dispo:PT/OT, CIR consult, pain better controlled today with med adjustments   LOS: 6 days    Henreitta Cea , [facility] 10/21/2019, 9:35 AM Please see Amion for pager number during day hours 7:00am-4:30pm

## 2019-10-21 NOTE — Plan of Care (Signed)
  Problem: Education: Goal: Knowledge of General Education information will improve Description: Including pain rating scale, medication(s)/side effects and non-pharmacologic comfort measures Outcome: Progressing   Problem: Education: Goal: Knowledge of General Education information will improve Description: Including pain rating scale, medication(s)/side effects and non-pharmacologic comfort measures Outcome: Progressing   Problem: Health Behavior/Discharge Planning: Goal: Ability to manage health-related needs will improve Outcome: Progressing   Problem: Clinical Measurements: Goal: Ability to maintain clinical measurements within normal limits will improve Outcome: Progressing Goal: Respiratory complications will improve Outcome: Progressing   Problem: Activity: Goal: Risk for activity intolerance will decrease Outcome: Progressing   Problem: Nutrition: Goal: Adequate nutrition will be maintained Outcome: Progressing   Problem: Coping: Goal: Level of anxiety will decrease Outcome: Progressing   Problem: Elimination: Goal: Will not experience complications related to urinary retention Outcome: Progressing   Problem: Pain Managment: Goal: General experience of comfort will improve Outcome: Progressing   Problem: Safety: Goal: Ability to remain free from injury will improve Outcome: Progressing   Problem: Skin Integrity: Goal: Risk for impaired skin integrity will decrease Outcome: Progressing

## 2019-10-21 NOTE — Progress Notes (Signed)
Neurosurgery provider on call, Cabbell, MD, paged regarding pt's restrictions with new LSO brace. Awaiting MD response. Will continue to monitor.

## 2019-10-21 NOTE — Progress Notes (Signed)
Occupational Therapy Treatment Patient Details Name: Louis Jordan MRN: [id number] DOB: 1956-07-23 Today's Date: 10/21/2019    History of present illness Admitted after fall from ladder resulting in L1 burst fracture, now s/p lumbar surgery for fixation (clamshell brace, may don sitting), L ankle comminuted fracture s/p ORIF 11/4, and L shoulder dislocation s/p L arthroscopic labral and capsular repair, loose body removal, and debridement 11/4; Lumbar surgery 11/1   OT comments  Session limited due to pt's fatigue/lethargy and pain. [name] present and and had many question sand ADL/selfcare and inpatient rehab services (pt planning to go CIR once stable). OT initiated  sling/shoulder protocol education with pt and his wife including NWB status, no ROM/movement of L UE except to wiggle fingers, ADL compensatory techniques and positioning. OT will continue to follow acutely  Follow Up Recommendations  CIR    Equipment Recommendations  3 in 1 bedside commode;Tub/shower seat;Other (comment)(ADL A/E kit)    Recommendations for Other Services      Precautions / Restrictions Precautions Precautions: Fall;Back Precaution Comments: L shoulder dislocation - maintain sling; NWB L UE, provided back handout, initiated sling edcuation handout Required Braces or Orthoses: Sling;Spinal Brace Spinal Brace Comments: awaiting custom LSO brace per MD note Restrictions Weight Bearing Restrictions: Yes LUE Weight Bearing: Non weight bearing LLE Weight Bearing: Non weight bearing Other Position/Activity Restrictions: awaiting LSO custom back brace to fit with shoulder abduction sling       Mobility Bed Mobility               General bed mobility comments: not addressed due to lethargy  Transfers                 General transfer comment: not attempted    Balance                                           ADL either performed or assessed with clinical judgement   ADL  Overall ADL's : Needs assistance/impaired                                       General ADL Comments: Initiated sling/shoulder protocol education with pt and his wife including NWB status, no ROM/movement of L UE except to wiggle fingers, ADL compensatory techniques and positioing     Vision Patient Visual Report: No change from baseline     Perception     Praxis      Cognition Arousal/Alertness: Lethargic;Suspect due to medications Behavior During Therapy: [facility] for tasks assessed/performed Overall Cognitive Status: Within Functional Limits for tasks assessed                                          Exercises     Shoulder Instructions       General Comments      Pertinent Vitals/ Pain       Pain Assessment: 0-10 Pain Score: 4  Pain Descriptors / Indicators: Aching;Sore Pain Intervention(s): Limited activity within patient's tolerance;Monitored during session;Premedicated before session  Home Living  Prior Functioning/Environment              Frequency  Min 2X/week        Progress Toward Goals  OT Goals(current goals can now be found in the care plan section)  Progress towards OT goals: OT to reassess next treatment     Plan Discharge plan remains appropriate    Co-evaluation                 AM-PAC OT "6 Clicks" Daily Activity     Outcome Measure   Help from another person eating meals?: None Help from another person taking care of personal grooming?: A Little Help from another person toileting, which includes using toliet, bedpan, or urinal?: Total Help from another person bathing (including washing, rinsing, drying)?: Total Help from another person to put on and taking off regular upper body clothing?: A Lot Help from another person to put on and taking off regular lower body clothing?: Total 6 Click Score: 12    End of Session    OT Visit  Diagnosis: Other abnormalities of gait and mobility (R26.89);Muscle weakness (generalized) (M62.81);History of falling (Z91.81);Pain Pain - Right/Left: Left Pain - part of body: Leg;Ankle and joints of foot;Shoulder   Activity Tolerance Patient limited by pain;Patient limited by fatigue   Patient Left in chair;with call bell/phone within reach;with family/visitor present   Nurse Communication          Time: 3382-5053 OT Time Calculation (min): 28 min  Charges: OT General Charges $OT Visit: 1 Visit OT Treatments $Self Care/Home Management : 8-22 mins $Therapeutic Activity: 8-22 mins     Britt Bottom 10/21/2019, 1:39 PM

## 2019-10-21 NOTE — Consult Note (Signed)
Inpatient Rehab Admissions:  Inpatient Rehab Consult received.  I met with pt and his wife at the bedside for rehabilitation assessment and to discuss goals and expectations of an inpatient rehab admission. Pt was independent PTA, worked full time, and has great social support at [employer]. Feel pt will be a great CIR candidate once he can get a proper fitting brace and be able to transfer out of bed safely. Pt highly motivated to work with therapies and return home. Plan to follow up Monday to see if pt is at a point where I can submit for insurance authorization for IP Rehab.   Please call if questions.  Jhonnie Garner, OTR/L  Rehab Admissions Coordinator  [phone] 10/21/2019 12:36 PM

## 2019-10-21 NOTE — PMR Pre-admission (Signed)
PMR Admission Coordinator Pre-Admission Assessment  Patient: Louis Jordan is an 63 y.o., male MRN: [id number] DOB: 01-14-1956 Height: 6' (182.9 cm) Weight: 70.3 kg  Insurance Information HMO:     PPO: yes     PCP:      IPA:      80/20:      OTHER: PRIMARY: [employer]      Policy#: [id number]      Subscriber: Patient CM Name: Lattie Haw      Phone#: [phone]     Fax#: [phone] Pre-Cert#: [id number]      Employer:  [employer] provided by Lattie Haw on 11/10 for admit to CIR on 11/10. Pt is approved for 7 days with updates due 11/17. Follow up CM is Lattie Haw (P); [phone] (f): [phone] Benefits:  Phone #: online - [email or web address]     Name: [email or web address] Eff. Date: 12/15/2018 - 12/15/2019 (revised on 04/15/2019)     Deduct: $1,000 ($174.36 met)      Out of Pocket Max: $3,000 ($174.36 met - includes deductible)      Life Max: NA CIR: coverage is 80%, 20% co-insurance      SNF: 80% coverage, 20% co-oinsurnace; limited by medical necessity review and requires continual improvement Outpatient: 80% coverage, 20% co-insurance; limited to 20 visits combined with OT/PT/Manipulation therapy/and aquatic for office or out pt. Hopsital, limited to 20 visits ST (accumulation separate from other therapies).     Home Health: 80% coverage, 20% co-insurance; limited by medical necessity review with 1 visit = 4 hours     DME: 80% coverage     Co-Pay: 20% co-insurance Providers:  SECONDARY: None      Policy#:       Subscriber:  CM Name:       Phone#:      Fax#:  Pre-Cert#:       Employer:  Benefits:  Phone #:      Name:  Eff. Date:      Deduct:      Out of Pocket Max:       Life Max:  CIR:       SNF:  Outpatient:      Co-Pay:  Home Health:       Co-Pay:  DME:      Co-Pay:  Medicaid Application Date:       Case Manager:  Disability Application Date:       Case Worker:   The "Data Collection Information Summary" for patients in Inpatient Rehabilitation Facilities with attached "Privacy Act [place] Records" was provided and  verbally reviewed with: N/A  Emergency Contact Information Contact Information    Name Relation Home Work Mobile   Bertone,Jessica Spouse [phone]  [phone]      Current Medical History  Patient Admitting Diagnosis: multi-ortho trauma  History of Present Illness: Louis Jordan is a 63 year old male with who fell 12 feet off a ladder on 10/15/19 with subsequent L1 burst fracture with retropulsion, left anterior shoulder dislocation and left trimalleolar ankle fracture. Shoulder reduced by trauma with sling recommended and left ankle splinted. He was taken to OR for ORIF L1 fracture with decompressive laminectomy with removal of epidural hematoma, medial facetectomy and foraminotomy with posterior fixation T10- L4 and intertransverse arthrodesis of L4 by Dr. Ronnald Ramp.   Noted to have perineal numbness due to conus injury with neurogenic bowel and bladder. He did have repeat dislocation of left shoulder requiring reduction 11/2 and Dr. Griffin Basil consulted who felt that patient with Bankart injury --arthroscopic stabilization recommended. He was  taken to OR 11/4 for arthroscopy with removal of loose bodies and labral repair and capsulorrhaphy as well as ORIF left trimalleolar ankle fracture by Dr. Doreatha Martin.  Post op NWB LLE and  NWB LUE for  6 weeks with gentle stretching of elbow due to highly unstable shoulder.     Custom LSO ordered for back support as sling inhibits TLSO use. Foley in place due to urinary retention. He continues on decadron 4 mg qid with elevated fasting BS. He continues to have saddle anesthesia and received SMOG enema 11/09 and on dulcolax suppositories bid.  He reports saddle anesthesia with abdominal pain, LLE > back pain that is constant. Is tolerating increase in activity but does get nauseous with fatigue. 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 CIR recommended due to functional decline.  Pt is to admit to CIR 10/25/2019.     Patient's medical  record from [facility] has been reviewed by the rehabilitation admission coordinator and physician.  Past Medical History  Past Medical History:  Diagnosis Date  . Bundle branch block, right   . Complication of anesthesia   . Hand pain    mild OA bilateral  hands  . Left knee injury 1970   requiring surgery for repair of ligaments/cartilage damage with nerve injury that took 20 years to resolve  . MVP (mitral valve prolapse)   . PONV (postoperative nausea and vomiting)     Family History   family history includes Alzheimer's disease in his mother; Heart disease in his mother; High blood pressure in his brother; Pancreatic cancer in his father.  Prior Rehab/Hospitalizations Has the patient had prior rehab or hospitalizations prior to admission? No  Has the patient had major surgery during 100 days prior to admission? Yes   Current Medications  Current Facility-Administered Medications:  .  acetaminophen (TYLENOL) tablet 1,000 mg, 1,000 mg, Oral, Q6H, Saverio Danker, PA-C, 1,000 mg at 10/25/19 0819 .  bisacodyl (DULCOLAX) suppository 10 mg, 10 mg, Rectal, QID, Saverio Danker, PA-C, 10 mg at 10/24/19 2140 .  celecoxib (CELEBREX) capsule 200 mg, 200 mg, Oral, Q12H, Patrecia Pace A, PA-C, 200 mg at 10/25/19 1043 .  Chlorhexidine Gluconate Cloth 2 % PADS 6 each, 6 each, Topical, Daily, Delray Alt, PA-C, 6 each at 10/25/19 1050 .  dexamethasone (DECADRON) injection 4 mg, 4 mg, Intravenous, Q6H, 4 mg at 10/22/19 1807 **OR** dexamethasone (DECADRON) tablet 4 mg, 4 mg, Oral, Q6H, Yacobi, Sarah A, PA-C, 4 mg at 10/25/19 1142 .  docusate sodium (COLACE) capsule 100 mg, 100 mg, Oral, BID, Saverio Danker, PA-C, 100 mg at 10/25/19 1043 .  enoxaparin (LOVENOX) injection 40 mg, 40 mg, Subcutaneous, Q24H, Saverio Danker, PA-C, 40 mg at 10/25/19 1146 .  gabapentin (NEURONTIN) capsule 200 mg, 200 mg, Oral, BID, Saverio Danker, PA-C, 200 mg at 10/25/19 1344 .  methocarbamol  (ROBAXIN) tablet 500 mg, 500 mg, Oral, QID, 500 mg at 10/25/19 1343 **OR** [DISCONTINUED] methocarbamol (ROBAXIN) 500 mg in dextrose 5 % 50 mL IVPB, 500 mg, Intravenous, Q6H PRN, Eustace Moore, MD .  morphine 2 MG/ML injection 1 mg, 1 mg, Intravenous, Q8H PRN, Lovick, Montel Culver, MD .  ondansetron (ZOFRAN) tablet 4 mg, 4 mg, Oral, Q6H PRN **OR** ondansetron (ZOFRAN) injection 4 mg, 4 mg, Intravenous, Q6H PRN, Delray Alt, PA-C, 4 mg at 10/21/19 2107 .  oxybutynin (DITROPAN-XL) 24 hr tablet 5 mg, 5 mg, Oral, QHS, Lovick, Montel Culver, MD,  5 mg at 10/24/19 2317 .  oxyCODONE (Oxy IR/ROXICODONE) immediate release tablet 5-10 mg, 5-10 mg, Oral, Q4H PRN, Patrecia Pace A, PA-C, 10 mg at 10/25/19 1343 .  polyethylene glycol (MIRALAX / GLYCOLAX) packet 17 g, 17 g, Oral, BID, Saverio Danker, PA-C, 17 g at 10/25/19 1045 .  senna (SENOKOT) tablet 8.6 mg, 1 tablet, Oral, BID, Delray Alt, PA-C, 8.6 mg at 10/24/19 2140 .  sodium phosphate (FLEET) 7-19 GM/118ML enema 1 enema, 1 enema, Rectal, Daily PRN, Georganna Skeans, MD, 1 enema at 10/23/19 1548 .  traMADol (ULTRAM) tablet 50 mg, 50 mg, Oral, Q6H, Saverio Danker, PA-C, 50 mg at 10/25/19 1142  Patients Current Diet:  Diet Order            Diet regular Room service appropriate? Yes; Fluid consistency: Thin  Diet effective now              Precautions / Restrictions Precautions Precautions: Fall, Back Precaution Comments: L shoulder dislocation - maintain sling; NWB L UE, provided back handout, initiated sling edcuation handout Spinal Brace: Applied in sitting position Spinal Brace Comments: awaiting custom LSO brace per MD note Restrictions Weight Bearing Restrictions: Yes LUE Weight Bearing: Non weight bearing LLE Weight Bearing: Non weight bearing Other Position/Activity Restrictions: awaiting LSO custom back brace to fit with shoulder abduction sling   Has the patient had 2 or more falls or a fall with injury in the past year? Yes  Prior  Activity Level Community (5-7x/wk): very active, worked full time in an office job. drove PTA; no AD use PTA  Prior Functional Level Self Care: Did the patient need help bathing, dressing, using the toilet or eating? Independent  Indoor Mobility: Did the patient need assistance with walking from room to room (with or without device)? Independent  Stairs: Did the patient need assistance with internal or external stairs (with or without device)? Independent  Functional Cognition: Did the patient need help planning regular tasks such as shopping or remembering to take medications? Independent  Home Assistive Devices / Equipment Home Equipment: None  Prior Device Use: Indicate devices/aids used by the patient prior to current illness, exacerbation or injury? None of the above  Current Functional Level Cognition  Overall Cognitive Status: Within Functional Limits for tasks assessed Orientation Level: Oriented X4 General Comments: anxious     Extremity Assessment (includes Sensation/Coordination)  Upper Extremity Assessment: LUE deficits/detail LUE Deficits / Details: NWB to UE LUE: Unable to fully assess due to pain, Unable to fully assess due to immobilization LUE Sensation: WNL LUE Coordination: decreased gross motor  Lower Extremity Assessment: Defer to PT evaluation    ADLs  Overall ADL's : Needs assistance/impaired Eating/Feeding: Independent, Sitting, Bed level Grooming: Wash/dry hands, Wash/dry face, Min guard, Sitting Upper Body Bathing: Min guard, Sitting Lower Body Bathing: Moderate assistance, Sitting/lateral leans Upper Body Dressing : Min guard, Cueing for safety Lower Body Dressing: Total assistance Toilet Transfer Details (indicate cue type and reason): declined, just back to bed recently from being up in recliner after PT approximately 1.5 hours. Per PT note, pt mod A +2/max A sit - stand and min/min guard A with lateral scoot transfers Toileting- Clothing  Manipulation and Hygiene: Total assistance, Bed level General ADL Comments: limited to fear within the session but complete sit to stands with +2 and varrying level of assist from raised surface     Mobility  Overal bed mobility: Needs Assistance Bed Mobility: Rolling, Sidelying to Sit, Sit to Sidelying Rolling: Min assist, +  2 for safety/equipment Sidelying to sit: Mod assist Supine to sit: Mod assist, +2 for physical assistance, +2 for safety/equipment Sit to supine: Mod assist, +2 for physical assistance, +2 for safety/equipment Sit to sidelying: Mod assist General bed mobility comments: cues for sequencing and assistance required to bring bilat LE/hips to EOB and to elevate trunk into sitting; light mod, almost min assist to push up to sit -- good push with R UE    Transfers  Overall transfer level: Needs assistance Transfers: Lateral/Scoot Transfers, Sit to/from Stand Sit to Stand: Mod assist, +2 physical assistance, +2 safety/equipment, From elevated surface, Max assist  Lateral/Scoot Transfers: Min assist, Min guard General transfer comment: declined, just back to bed recently from being up in recliner after PT approximately 1.5 hours. Per PT note, pt mod A +2/max A sit - stand and min/min guard A with lateral scoot transfers    Ambulation / Gait / Stairs / Wheelchair Mobility       Posture / Balance Dynamic Sitting Balance Sitting balance - Comments: Performed R lean and elbow prop while sitting on BSC (drop-arm down) for pericare; able to push back up with RUE Balance Overall balance assessment: Needs assistance Sitting-balance support: Feet supported, Single extremity supported Sitting balance-Leahy Scale: Fair Sitting balance - Comments: Performed R lean and elbow prop while sitting on BSC (drop-arm down) for pericare; able to push back up with RUE Postural control: Posterior lean    Special needs/care consideration BiPAP/CPAP : no CPM : no Continuous Drip IV :  no Dialysis : no        Days : no Life Vest : no Oxygen : no Special Bed : no Trach Size : no Wound Vac (area) : no      Location : no Skin : surgical incision to the back, ankle (left), shoulder (left).  Bowel mgmt: last charted 10/31-per pt had BM today  Bladder mgmt: catheter Diabetic mgmt: no Behavioral consideration : no Chemo/radiation : no   Previous Home Environment (from acute therapy documentation) Living Arrangements: Spouse/significant other Available Help at Discharge: Family, Available 24 hours/day Type of Home: House Home Layout: One level Home Access: Stairs to enter [employer] Shower/Tub: Multimedia programmer: [place]: No  Discharge Living Setting Plans for Discharge Living Setting: Patient's home, Lives with (comment)(wife) Type of Home at Discharge: House Discharge Home Layout: Two level Alternate Level Stairs-Rails: None(NA, pt does not need to use 2nd stor for any tasks at DC) Alternate Level Stairs-Number of Steps: NA Discharge Home Access: Stairs to enter Entrance Stairs-Rails: None Entrance Stairs-Number of Steps: 1 Discharge Bathroom Shower/Tub: Tub/shower unit, Horticulturist, commercial: Standard Discharge Bathroom Accessibility: Yes How Accessible: Accessible via walker, Other (comment)(wife is measuring doorways for wc accessibility) Does the patient have any problems obtaining your medications?: No  Social/Family/Support Systems Patient Roles: Spouse(employee) Contact Information: (wife: [name] [phone]) Anticipated Caregiver: wife Anticipated Caregiver's Contact Information: see above Ability/Limitations of Caregiver: Min A Caregiver Availability: 24/7 Discharge Plan Discussed with Primary Caregiver: Yes Is Caregiver In Agreement with Plan?: Yes Does Caregiver/Family have Issues with Lodging/Transportation while Pt is in Rehab?: No  Goals/Additional Needs Patient/Family Goal for Rehab:  PT: Supervision; OT: Min A; SLP: NA Expected length of stay: 14-18 days  Cultural Considerations: NA Dietary Needs: regular diet, thin liquids Equipment Needs: TBD Pt/Family Agrees to Admission and willing to participate: Yes Program Orientation Provided & Reviewed with Pt/Caregiver Including Roles  & Responsibilities: Yes(wife)  Barriers to Discharge: Home environment  access/layout, Weight bearing restrictions  Barriers to Discharge Comments: one ledge to enter home environment, unsure if wc accessable environment throughout home.   Decrease burden of Care through IP rehab admission: NA  Possible need for SNF placement upon discharge: Not anticipated. Pt has great social assist at DC from his wife and has grown children nearby who can assist as needed. For the most part, pt's home environment can be modified for greater assessability. Anticipate pt can make large gains in a reasonable amount of time to return home with light assist at DC.   Patient Condition: I have reviewed medical records from [facility], spoken with MD, and patient and spouse. I met with patient at the bedside for inpatient rehabilitation assessment.  Patient will benefit from ongoing PT and OT, can actively participate in 3 hours of therapy a day 5 days of the week, and can make measurable gains during the admission.  Patient will also benefit from the coordinated team approach during an Inpatient Acute Rehabilitation admission.  The patient will receive intensive therapy as well as Rehabilitation physician, nursing, social worker, and care management interventions.  Due to bladder management, bowel management, safety, skin/wound care, disease management, medication administration, pain management and patient education the patient requires 24 hour a day rehabilitation nursing.  The patient is currently Mod A +2 sit to stand, Min A/Min G for lateral scoot transfers and Min G to Total A for basic ADLs.  Discharge  setting and therapy post discharge at home with home health is anticipated.  Patient has agreed to participate in the Acute Inpatient Rehabilitation Program and will admit 10/25/2019.  Preadmission Screen Completed By:  Raechel Ache, 10/25/2019 4:58 PM ______________________________________________________________________   Discussed status with Dr. Dagoberto Ligas on 10/25/2019 at 4:50PM and received approval for admission today.  Admission Coordinator:  Raechel Ache, OT, time 4:58PM/Date 10/25/2019   Assessment/Plan: Diagnosis: 1. Does the need for close, 24 hr/day Medical supervision in concert with the patient's rehab needs make it unreasonable for this patient to be served in a less intensive setting? Yes 2. Co-Morbidities requiring supervision/potential complications: L shoulder labral tear, L trimalleolar ankle fracture, Cauda equina syndrome, neurogenic bowel and bladder, post op pain 3. Due to bladder management, bowel management, skin/wound care and medication administration, does the patient require 24 hr/day rehab nursing? Yes 4. Does the patient require coordinated care of a physician, rehab nurse, PT, OT, and SLP to address physical and functional deficits in the context of the above medical diagnosis(es)? Yes Addressing deficits in the following areas: balance, locomotion, strength, transferring, bowel/bladder control, bathing, dressing, toileting and psychosocial support 5. Can the patient actively participate in an intensive therapy program of at least 3 hrs of therapy 5 days a week? Yes 6. The potential for patient to make measurable gains while on inpatient rehab is good 7. Anticipated functional outcomes upon discharge from inpatient rehab: supervision PT, supervision OT, n/a SLP 8. Estimated rehab length of stay to reach the above functional goals is: 14-18 days 9. Anticipated discharge destination: Home 10. Overall Rehab/Functional Prognosis: good   MD Signature:

## 2019-10-22 DIAGNOSIS — W11XXXA Fall on and from ladder, initial encounter: Secondary | ICD-10-CM

## 2019-10-22 DIAGNOSIS — S82852A Displaced trimalleolar fracture of left lower leg, initial encounter for closed fracture: Secondary | ICD-10-CM

## 2019-10-22 DIAGNOSIS — S43492A Other sprain of left shoulder joint, initial encounter: Secondary | ICD-10-CM

## 2019-10-22 LAB — BASIC METABOLIC PANEL
Anion gap: 7 (ref 5–15)
BUN: 18 mg/dL (ref 8–23)
CO2: 27 mmol/L (ref 22–32)
Calcium: 8.3 mg/dL — ABNORMAL LOW (ref 8.9–10.3)
Chloride: 103 mmol/L (ref 98–111)
Creatinine, Ser: 0.78 mg/dL (ref 0.61–1.24)
GFR calc Af Amer: >60 mL/min (ref >60)
GFR calc non Af Amer: >60 mL/min (ref >60)
Glucose, Bld: 111 mg/dL — ABNORMAL HIGH (ref 70–99)
Potassium: 4.2 mmol/L (ref 3.5–5.1)
Sodium: 137 mmol/L (ref 135–145)

## 2019-10-22 LAB — CBC
HCT: 29.5 % — ABNORMAL LOW (ref 39.0–52.0)
Hemoglobin: 10.1 g/dL — ABNORMAL LOW (ref 13.0–17.0)
MCH: 31.9 pg (ref 26.0–34.0)
MCHC: 34.2 g/dL (ref 30.0–36.0)
MCV: 93.1 fL (ref 80.0–100.0)
Platelets: 295 10*3/uL (ref 150–400)
RBC: 3.17 MIL/uL — ABNORMAL LOW (ref 4.22–5.81)
RDW: 12.7 % (ref 11.5–15.5)
WBC: 8.6 10*3/uL (ref 4.0–10.5)
nRBC: 0 % (ref 0.0–0.2)

## 2019-10-22 NOTE — Progress Notes (Signed)
Occupational Therapy Treatment Patient Details Name: Louis JeffersonGreg Jordan MRN: [id number] DOB: 1956-04-17 Today's Date: 10/22/2019    History of present illness Admitted after fall from ladder resulting in L1 burst fracture, now s/p lumbar surgery for fixation (clamshell brace, may don sitting), L ankle comminuted fracture s/p ORIF 11/4, and L shoulder dislocation s/p L arthroscopic labral and capsular repair, loose body removal, and debridement 11/4; Lumbar surgery 11/1   OT comments  Pt admitted with L1 burst fracture, now s/p lumbar surgery for fixation (clamshell brace, may don sitting), L ankle comminuted fracture s/p ORIF 11/4, and L shoulder dislocation s/p L arthroscopic labral and capsular repair, loose body removal, and debridement 11/4; Lumbar surgery 11/1. Pt currently with functional limitations due to the deficits listed below (see OT Problem List). Pt was seen with Physical therapy and was very fearful of completion of any movent at this time. Pt completed bed mobility supine to sitting with mod + 2, sit to stand transfers with limited clearance from bed on first trial with mod +2 then went to max +1 and min guard +1 for repositioning of supplies. Pt reported following session felt better with pain but still at 8.   Pt will benefit from skilled OT to increase their safety and independence with ADL and functional mobility for ADL to facilitate discharge to venue listed below.     Follow Up Recommendations  CIR    Equipment Recommendations  3 in 1 bedside commode;Tub/shower seat;Other (comment)    Recommendations for Other Services Rehab consult    Precautions / Restrictions Precautions Precautions: Fall;Back Precaution Comments: L shoulder dislocation - maintain sling; NWB L UE, provided back handout, initiated sling edcuation handout Required Braces or Orthoses: Sling;Spinal Brace Spinal Brace: (LSO but does best applying in supine at this time ) Restrictions Weight Bearing  Restrictions: Yes LUE Weight Bearing: Non weight bearing LLE Weight Bearing: Non weight bearing       Mobility Bed Mobility Overal bed mobility: Needs Assistance Bed Mobility: Rolling;Supine to Sit;Sit to Supine Rolling: Mod assist;+2 for safety/equipment   Supine to sit: Mod assist;+2 for physical assistance;+2 for safety/equipment Sit to supine: Mod assist;+2 for physical assistance;+2 for safety/equipment      Transfers Overall transfer level: Needs assistance   Transfers: Sit to/from Stand Sit to Stand: Mod assist;+2 physical assistance;+2 safety/equipment;From elevated surface(only limited clerance from bed )              Balance Overall balance assessment: Needs assistance Sitting-balance support: Feet supported;Single extremity supported Sitting balance-Leahy Scale: Fair Sitting balance - Comments: able to sit EOB and perform LE movement without LOB, no external support needed Postural control: Posterior lean                                 ADL either performed or assessed with clinical judgement   ADL Overall ADL's : Needs assistance/impaired Eating/Feeding: Independent;Sitting;Bed level   Grooming: Wash/dry hands;Wash/dry face;Min guard;Sitting   Upper Body Bathing: Min guard;Sitting   Lower Body Bathing: Maximal assistance   Upper Body Dressing : Min guard;Cueing for safety   Lower Body Dressing: Total assistance                 General ADL Comments: limited to fear within the session but complete sit to stands with +2 and varrying level of assist from raised surface      Vision       Perception  Praxis      Cognition Arousal/Alertness: Awake/alert Behavior During Therapy: WFL for tasks assessed/performed Overall Cognitive Status: Within Functional Limits for tasks assessed                                          Exercises Exercises: Hand exercises   Shoulder Instructions       General  Comments      Pertinent Vitals/ Pain       Pain Assessment: 0-10 Pain Score: 8  Pain Location: back Pain Descriptors / Indicators: Aching;Sore Pain Intervention(s): Limited activity within patient's tolerance;Monitored during session;Premedicated before session;Repositioned  Home Living                                          Prior Functioning/Environment              Frequency  Min 2X/week        Progress Toward Goals  OT Goals(current goals can now be found in the care plan section)  Progress towards OT goals: Progressing toward goals  Acute Rehab OT Goals Patient Stated Goal: "move the right way" OT Goal Formulation: With patient/family Time For Goal Achievement: 11/01/19 Potential to Achieve Goals: Good ADL Goals Pt Will Perform Grooming: with supervision;with set-up;sitting;with caregiver independent in assisting Pt Will Perform Upper Body Bathing: with supervision;with set-up;sitting;with caregiver independent in assisting Pt Will Perform Lower Body Bathing: with mod assist;with adaptive equipment;sitting/lateral leans;with caregiver independent in assisting Pt Will Perform Upper Body Dressing: with supervision;with set-up;sitting;with caregiver independent in assisting Pt Will Transfer to Toilet: with mod assist;with min assist;ambulating;stand pivot transfer;bedside commode Additional ADL Goal #1: pt will complete bed mobility min A using proper technique to sit EOB in prep to don back brace and for selfcare tasks  Plan Discharge plan remains appropriate    Co-evaluation    PT/OT/SLP Co-Evaluation/Treatment: Yes Reason for Co-Treatment: Complexity of the patient's impairments (multi-system involvement);For patient/therapist safety;To address functional/ADL transfers   OT goals addressed during session: ADL's and self-care      AM-PAC OT "6 Clicks" Daily Activity     Outcome Measure   Help from another person eating meals?: None Help  from another person taking care of personal grooming?: A Little Help from another person toileting, which includes using toliet, bedpan, or urinal?: Total Help from another person bathing (including washing, rinsing, drying)?: Total Help from another person to put on and taking off regular upper body clothing?: A Lot Help from another person to put on and taking off regular lower body clothing?: Total 6 Click Score: 12    End of Session Equipment Utilized During Treatment: Gait belt;Back brace(abductor sling)  OT Visit Diagnosis: Other abnormalities of gait and mobility (R26.89);Muscle weakness (generalized) (M62.81);History of falling (Z91.81);Pain Pain - Right/Left: Left Pain - part of body: Leg;Ankle and joints of foot;Shoulder   Activity Tolerance Patient limited by pain;Patient limited by fatigue   Patient Left in bed;with call bell/phone within reach   Nurse Communication          Time: 1950-9326 OT Time Calculation (min): 56 min  Charges: OT General Charges $OT Visit: 1 Visit OT Treatments $Self Care/Home Management : 23-37 mins  Joeseph Amor OTR/L  Acute Rehab Services  [phone] office number [phone] pager number    Vladimir Creeks  Sharise Lippy 10/22/2019, 11:33 AM

## 2019-10-22 NOTE — Progress Notes (Signed)
Patient ID: Louis Jordan, male   DOB: [birth year], 63 y.o.   MRN: [id number] Patient doing well back pain manageable denies any lower extremity symptoms   Strength 5 out of 5  Mobilize in brace with physical occupational therapy  Consider discontinuing the Foley during the day or wait till patient is able to be mobilized.  Apparently did have some urinary retention with conus compression preop so may require catheterization or teaching self intermittent catheterizations.

## 2019-10-22 NOTE — Progress Notes (Signed)
Patient ID: Louis Jordan, male   DOB: [birth year], 63 y.o.   MRN: [id number]    3 Days Post-Op  Subjective: No acute change  ROS: See above, otherwise other systems negative  Objective: Vital signs in last 24 hours: Temp:  [97.7 F (36.5 C)-98.6 F (37 C)] 98.3 F (36.8 C) (11/07 0457) Pulse Rate:  [48-65] 48 (11/07 0457) Resp:  [16-18] 18 (11/06 2300) BP: (136-167)/(73-89) 156/89 (11/07 0457) SpO2:  [96 %-99 %] 96 % (11/07 0457) Last BM Date: 10/15/19  Intake/Output from previous day: 11/06 0701 - 11/07 0700 In: 2757 [P.O.:1080; I.V.:1677] Out: 2475 [Urine:2475] Intake/Output this shift: Total I/O In: -  Out: 675 [Urine:675]  PE: Gen: NAD Heart: regular Lungs: CTAB Abd: soft, NT, ND, +BS GU: foley in place with clear yellow urine Ext: LUE with dressing in place, c/d/i.  Splint in place to left ankle.  Wiggles toes with normal sensation.    Lab Results:  Recent Labs    10/21/19 1027 10/22/19 0407  WBC 10.2 8.6  HGB 11.2* 10.1*  HCT 33.2* 29.5*  PLT 282 295   BMET Recent Labs    10/20/19 0411 10/22/19 0407  NA 138 137  K 4.6 4.2  CL 105 103  CO2 26 27  GLUCOSE 136* 111*  BUN 22 18  CREATININE 0.87 0.78  CALCIUM 8.1* 8.3*   PT/INR No results for input(s): LABPROT, INR in the last 72 hours. CMP     Component Value Date/Time   NA 137 10/22/2019 0407   K 4.2 10/22/2019 0407   CL 103 10/22/2019 0407   CO2 27 10/22/2019 0407   GLUCOSE 111 (H) 10/22/2019 0407   BUN 18 10/22/2019 0407   CREATININE 0.78 10/22/2019 0407   CALCIUM 8.3 (L) 10/22/2019 0407   PROT 6.1 (L) 10/15/2019 1556   ALBUMIN 3.9 10/15/2019 1556   AST 36 10/15/2019 1556   ALT 28 10/15/2019 1556   ALKPHOS 53 10/15/2019 1556   BILITOT 0.6 10/15/2019 1556   GFRNONAA >60 10/22/2019 0407   GFRAA >60 10/22/2019 0407   Lipase  No results found for: LIPASE     Studies/Results: Dg Thoracolumabar Spine  Result Date: 10/20/2019 CLINICAL DATA:  63 year old male with back pain. History  of prior spinal surgery and trauma. EXAM: THORACOLUMBAR SPINE 1V COMPARISON:  Fluoroscopy image dated 10/16/2019. FINDINGS: T10-L4 posterior fixation rods and transpedicular screws. There is compression fracture of L1 with approximately 30% loss of vertebral body height similar or slightly progressed since the prior study. No new fractures. The fixation hardware appears intact. The bones are osteopenic. There is grade 1 L1-L2 retrolisthesis. The soft tissues are unremarkable. Support tubes noted. IMPRESSION: 1. Stable or slightly progressed compression fracture of L1. No new fracture. 2. Stable appearance of the posterior fusion hardware of T10-L4. Electronically Signed   By: Anner Crete M.D.   On: 10/20/2019 10:08    Anti-infectives: Anti-infectives (From admission, onward)   Start     Dose/Rate Route Frequency Ordered Stop   10/19/19 1930  clindamycin (CLEOCIN) IVPB 900 mg    Note to Pharmacy: Please give 8 hours from intraoperative dose   900 mg 100 mL/hr over 30 Minutes Intravenous Every 8 hours 10/19/19 1755 10/20/19 1251   10/19/19 1247  vancomycin (VANCOCIN) powder  Status:  Discontinued       As needed 10/19/19 1248 10/19/19 1515   10/19/19 1130  clindamycin (CLEOCIN) IVPB 900 mg     900 mg 100 mL/hr over 30 Minutes Intravenous On  call to O.R. 10/19/19 0712 10/19/19 1217   10/16/19 2130  vancomycin (VANCOCIN) IVPB 1000 mg/200 mL premix  Status:  Discontinued     1,000 mg 200 mL/hr over 60 Minutes Intravenous  Once 10/16/19 1704 10/16/19 1737   10/16/19 2130  vancomycin (VANCOCIN) IVPB 1000 mg/200 mL premix  Status:  Discontinued     1,000 mg 200 mL/hr over 60 Minutes Intravenous Every 12 hours 10/16/19 1737 10/17/19 2021   10/16/19 1342  vancomycin (VANCOCIN) powder  Status:  Discontinued       As needed 10/16/19 1342 10/16/19 1431   10/16/19 1049  bacitracin 50,000 Units in sodium chloride 0.9 % 500 mL irrigation  Status:  Discontinued       As needed 10/16/19 1049 10/16/19 1431    10/16/19 0922  vancomycin (VANCOCIN) 1-5 GM/200ML-% IVPB    Note to Pharmacy: Launa Flight   : cabinet override      10/16/19 0922 10/16/19 1740       Assessment/Plan Fall from ladder L 1 burst fracture- fusion 11/1 Yetta Barre), now with peroneal numbness and urinary retention.Foley in place.On decadron per neuro.  LSO brace fitted.  Mobilize. PT/OT. Continue foley today per bedside d/w Dr. Wynetta Emery. L anterior shoulder dislocation-OR 11/4, Dr. Everardo Pacific for labral repair and capsulorrhaphy.  Must stay in sling in abducted position L ankle trimalleolar fracture-OR 11/4, Dr. Jena Gauss for ORIF, NWB, PT/OT ABL anemia - stable, hgb 10.1 FEN -regular diet, colace, miralax added Foley - In place for urinary retention, will continue for now VTE -SCD,Lovenox started today POD 5 from NS ID -van11/1-11/2 for back, Cleocin for 3 doses per ortho  Dispo:PT/OT, CIR consult   LOS: 7 days    Berna Bue , MD [facility] 10/22/2019, 8:47 AM

## 2019-10-22 NOTE — Progress Notes (Signed)
Physical Therapy Treatment Patient Details Name: Louis Jordan MRN: [id number] DOB: September 02, 1956 Today's Date: 10/22/2019    History of Present Illness Admitted after fall from ladder resulting in L1 burst fracture, now s/p lumbar surgery for fixation (clamshell brace, may don sitting), L ankle comminuted fracture s/p ORIF 11/4, and L shoulder dislocation s/p L arthroscopic labral and capsular repair, loose body removal, and debridement 11/4; Lumbar surgery 11/1    PT Comments    Patient seen for mobility progression. Pt requires mod/max A +2 for bed mobility and functional transfer training. Pt tolerated increased activity well and is making progress toward PT goals. Continue to progress as tolerated.    Follow Up Recommendations  CIR     Equipment Recommendations  Other (comment)(TBD)    Recommendations for Other Services       Precautions / Restrictions Precautions Precautions: Fall;Back Precaution Comments: L shoulder dislocation - maintain sling; NWB L UE, provided back handout, initiated sling edcuation handout Required Braces or Orthoses: Sling;Spinal Brace Spinal Brace: Applied in sitting position(LSO but pt prefers it be applied in supine ) Restrictions Weight Bearing Restrictions: Yes LUE Weight Bearing: Non weight bearing LLE Weight Bearing: Non weight bearing    Mobility  Bed Mobility Overal bed mobility: Needs Assistance Bed Mobility: Rolling;Supine to Sit;Sit to Supine Rolling: +2 for safety/equipment;Min assist   Supine to sit: Mod assist;+2 for physical assistance;+2 for safety/equipment Sit to supine: Mod assist;+2 for physical assistance;+2 for safety/equipment   General bed mobility comments: cues for sequencing and assistance required to bring bilat LE/hips to EOB and to elevate trunk into sitting   Transfers Overall transfer level: Needs assistance   Transfers: Sit to/from Stand Sit to Stand: Mod assist;+2 physical assistance;+2 safety/equipment;From  elevated surface;Max assist         General transfer comment: cues for positioning to maintain NWB L LE; R LE blocked at medial side due to pt adducting if not supported and assistance with gait belt to power up; 2 trials with limited clearance from EOB and then one partial stand  Ambulation/Gait                 Stairs             Wheelchair Mobility    Modified Rankin (Stroke Patients Only)       Balance Overall balance assessment: Needs assistance Sitting-balance support: Feet supported;Single extremity supported Sitting balance-Leahy Scale: Fair                                      Cognition Arousal/Alertness: Awake/alert Behavior During Therapy: WFL for tasks assessed/performed Overall Cognitive Status: Within Functional Limits for tasks assessed                                 General Comments: anxious       Exercises      General Comments        Pertinent Vitals/Pain Pain Assessment: 0-10 Pain Score: 8  Pain Location: back Pain Descriptors / Indicators: Aching;Sore Pain Intervention(s): Limited activity within patient's tolerance;Monitored during session;Repositioned;Premedicated before session    Home Living                      Prior Function            PT Goals (current goals can now  be found in the care plan section) Acute Rehab PT Goals Patient Stated Goal: "move the right way" Progress towards PT goals: Progressing toward goals    Frequency    Min 5X/week      PT Plan Current plan remains appropriate    Co-evaluation PT/OT/SLP Co-Evaluation/Treatment: Yes Reason for Co-Treatment: Complexity of the patient's impairments (multi-system involvement);For patient/therapist safety;To address functional/ADL transfers PT goals addressed during session: Mobility/safety with mobility        AM-PAC PT "6 Clicks" Mobility   Outcome Measure  Help needed turning from your back to your side  while in a flat bed without using bedrails?: A Lot Help needed moving from lying on your back to sitting on the side of a flat bed without using bedrails?: A Lot Help needed moving to and from a bed to a chair (including a wheelchair)?: Total Help needed standing up from a chair using your arms (e.g., wheelchair or bedside chair)?: A Lot Help needed to walk in hospital room?: Total Help needed climbing 3-5 steps with a railing? : Total 6 Click Score: 9    End of Session Equipment Utilized During Treatment: Gait belt;Back brace;Other (comment)(shoulder sling) Activity Tolerance: Patient tolerated treatment well Patient left: in bed;with call bell/phone within reach Nurse Communication: Mobility status PT Visit Diagnosis: Muscle weakness (generalized) (M62.81);Pain Pain - Right/Left: Left Pain - part of body: Shoulder;Leg     Time: 1224-8250 PT Time Calculation (min) (ACUTE ONLY): 56 min  Charges:  $Gait Training: 8-22 mins $Therapeutic Activity: 8-22 mins                     Erline Levine, PTA Acute Rehabilitation Services Pager: [phone] Office: [phone]     Carolynne Edouard 10/22/2019, 4:14 PM

## 2019-10-23 MED ORDER — BISACODYL 10 MG RE SUPP
10.0000 mg | Freq: Every day | RECTAL | Status: DC | PRN
  Administered 2019-10-23: 10 mg via RECTAL
  Filled 2019-10-23: qty 1

## 2019-10-23 MED ORDER — FLEET ENEMA 7-19 GM/118ML RE ENEM
1.0000 | ENEMA | Freq: Every day | RECTAL | Status: DC | PRN
  Administered 2019-10-23: 1 via RECTAL
  Filled 2019-10-23: qty 1

## 2019-10-23 NOTE — Progress Notes (Signed)
Patient ID: Louis Jordan, male   DOB: [birth year], 63 y.o.   MRN: [id number]  [facility] Progress Note:   4 Days Post-Op  Subjective: Mental status is clear and talkative Objective: Vital signs in last 24 hours: Temp:  [97.9 F (36.6 C)-99 F (37.2 C)] 97.9 F (36.6 C) (11/08 0546) Pulse Rate:  [48-57] 48 (11/08 0546) Resp:  [14-18] 18 (11/08 0546) BP: (140-172)/(79-92) 172/92 (11/08 0546) SpO2:  [98 %-99 %] 98 % (11/08 0546)  Intake/Output from previous day: 11/07 0701 - 11/08 0700 In: -  Out: 3425 [Urine:3425] Intake/Output this shift: No intake/output data recorded.  Physical Exam: Work of breathing is not labored.  No new pains or sensory deficits  Lab Results:  Results for orders placed or performed during the hospital encounter of 10/15/19 (from the past 48 hour(s))  CBC     Status: Abnormal   Collection Time: 10/21/19 10:27 AM  Result Value Ref Range   WBC 10.2 4.0 - 10.5 K/uL   RBC 3.57 (L) 4.22 - 5.81 MIL/uL   Hemoglobin 11.2 (L) 13.0 - 17.0 g/dL    Comment: REPEATED TO VERIFY   HCT 33.2 (L) 39.0 - 52.0 %   MCV 93.0 80.0 - 100.0 fL   MCH 31.4 26.0 - 34.0 pg   MCHC 33.7 30.0 - 36.0 g/dL   RDW 38.4 66.5 - 99.3 %   Platelets 282 150 - 400 K/uL   nRBC 0.0 0.0 - 0.2 %    Comment: Performed at [facility], [address], [place], [place] [address]  CBC     Status: Abnormal   Collection Time: 10/22/19  4:07 AM  Result Value Ref Range   WBC 8.6 4.0 - 10.5 K/uL   RBC 3.17 (L) 4.22 - 5.81 MIL/uL   Hemoglobin 10.1 (L) 13.0 - 17.0 g/dL   HCT 79.3 (L) 90.3 - 00.9 %   MCV 93.1 80.0 - 100.0 fL   MCH 31.9 26.0 - 34.0 pg   MCHC 34.2 30.0 - 36.0 g/dL   RDW 23.3 00.7 - 62.2 %   Platelets 295 150 - 400 K/uL   nRBC 0.0 0.0 - 0.2 %    Comment: Performed at [facility], [address] [place], [place] [address]  Basic metabolic panel     Status: Abnormal   Collection Time: 10/22/19  4:07 AM  Result Value Ref Range   Sodium 137 135 - 145 mmol/L    Potassium 4.2 3.5 - 5.1 mmol/L   Chloride 103 98 - 111 mmol/L   CO2 27 22 - 32 mmol/L   Glucose, Bld 111 (H) 70 - 99 mg/dL   BUN 18 8 - 23 mg/dL   Creatinine, Ser 4.56 0.61 - 1.24 mg/dL   Calcium 8.3 (L) 8.9 - 10.3 mg/dL   GFR calc non Af Amer >60 >60 mL/min   GFR calc Af Amer >60 >60 mL/min   Anion gap 7 5 - 15    Comment: Performed at [facility], [address] [place], [place] [address]    Radiology/Results: No results found.  Anti-infectives: Anti-infectives (From admission, onward)   Start     Dose/Rate Route Frequency Ordered Stop   10/19/19 1930  clindamycin (CLEOCIN) IVPB 900 mg    Note to Pharmacy: Please give 8 hours from intraoperative dose   900 mg 100 mL/hr over 30 Minutes Intravenous Every 8 hours 10/19/19 1755 10/20/19 1251   10/19/19 1247  vancomycin (VANCOCIN) powder  Status:  Discontinued       As needed 10/19/19 1248 10/19/19 1515   10/19/19 1130  clindamycin (CLEOCIN) IVPB 900 mg     900 mg 100 mL/hr over 30 Minutes Intravenous On call to O.R. 10/19/19 0712 10/19/19 1217   10/16/19 2130  vancomycin (VANCOCIN) IVPB 1000 mg/200 mL premix  Status:  Discontinued     1,000 mg 200 mL/hr over 60 Minutes Intravenous  Once 10/16/19 1704 10/16/19 1737   10/16/19 2130  vancomycin (VANCOCIN) IVPB 1000 mg/200 mL premix  Status:  Discontinued     1,000 mg 200 mL/hr over 60 Minutes Intravenous Every 12 hours 10/16/19 1737 10/17/19 2021   10/16/19 1342  vancomycin (VANCOCIN) powder  Status:  Discontinued       As needed 10/16/19 1342 10/16/19 1431   10/16/19 1049  bacitracin 50,000 Units in sodium chloride 0.9 % 500 mL irrigation  Status:  Discontinued       As needed 10/16/19 1049 10/16/19 1431   10/16/19 0922  vancomycin (VANCOCIN) 1-5 GM/200ML-% IVPB    Note to Pharmacy: Bobbie Stack   : cabinet override      10/16/19 0922 10/16/19 1740      Assessment/Plan: Problem List: Patient Active Problem List   Diagnosis Date Noted  . Fall from ladder 10/22/2019   . 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 Closed displaced trimalleolar fracture of left ankle 10/22/2019  . S/P lumbar fusion 10/16/2019  . Fracture of lumbar spine without lesion of spinal cord ([place]) 10/15/2019    Post fall from 15 foot ladder ~8 days ago (Friday) with multiple fractures and no head injury.  PT and OT will be working with him.   4 Days Post-Op    LOS: 8 days   Matt B. Hassell Done, MD, [facility] [phone] beeper [phone]  10/23/2019 8:57 AM

## 2019-10-23 NOTE — Progress Notes (Signed)
Patient ID: Louis Jordan, male   DOB: Mar 27, 1956, 63 y.o.   MRN: [id number] Alert and oriented x 4 Saddle anesthesia, no bowel movements, no control of urination Wound is cleaN, dry, no sign of infection Will need rehab

## 2019-10-23 NOTE — Progress Notes (Signed)
Pt alert and oriented , ORIF tibia/fibula Fx, shoulder arthroscopy with bankart repair with left sling, his last bowel movement last Saturday Nov 1 , its been 8 days no bowel movement, on Miralax and senna scheduled, given dulcolax supp no outcome, given fleet enema, pt and wife was so frustrated, encourage to drink lots of fluids, he said he's trying to push still no outcome, will continue to monitor.

## 2019-10-24 ENCOUNTER — Encounter (HOSPITAL_COMMUNITY): Payer: Self-pay | Admitting: Student

## 2019-10-24 ENCOUNTER — Inpatient Hospital Stay (HOSPITAL_COMMUNITY): Payer: Commercial Managed Care - PPO

## 2019-10-24 MED ORDER — POLYETHYLENE GLYCOL 3350 17 G PO PACK
17.0000 g | PACK | Freq: Two times a day (BID) | ORAL | Status: DC
  Administered 2019-10-24 – 2019-10-25 (×3): 17 g via ORAL
  Filled 2019-10-24 (×3): qty 1

## 2019-10-24 MED ORDER — DOCUSATE SODIUM 100 MG PO CAPS
100.0000 mg | ORAL_CAPSULE | Freq: Two times a day (BID) | ORAL | Status: DC
  Administered 2019-10-24 – 2019-10-25 (×3): 100 mg via ORAL
  Filled 2019-10-24 (×3): qty 1

## 2019-10-24 MED ORDER — BISACODYL 10 MG RE SUPP
10.0000 mg | Freq: Four times a day (QID) | RECTAL | Status: DC
  Administered 2019-10-24 (×2): 10 mg via RECTAL
  Filled 2019-10-24 (×2): qty 1

## 2019-10-24 MED ORDER — OXYBUTYNIN CHLORIDE ER 5 MG PO TB24
5.0000 mg | ORAL_TABLET | Freq: Every day | ORAL | Status: DC
  Administered 2019-10-24: 5 mg via ORAL
  Filled 2019-10-24 (×3): qty 1

## 2019-10-24 MED ORDER — MORPHINE SULFATE (PF) 2 MG/ML IV SOLN: 1.0000 mg | Freq: Three times a day (TID) | INTRAVENOUS | Status: DC | PRN

## 2019-10-24 MED ORDER — SORBITOL 70 % SOLN
960.0000 mL | TOPICAL_OIL | Freq: Once | ORAL | Status: AC
  Administered 2019-10-24: 960 mL via RECTAL
  Filled 2019-10-24 (×2): qty 473

## 2019-10-24 NOTE — Progress Notes (Signed)
Patient ID: Louis Jordan, male   DOB: 1956/08/20, 63 y.o.   MRN: [id number]    5 Days Post-Op  Subjective: Patient is saddened from injuries and how this is going to affect his life moving forward.  We had a long discussion about this today.  Continues to not have a BM.  Tried fleet enema yesterday.  Pain seems well controlled.  ROS: See above, otherwise other systems negative  Objective: Vital signs in last 24 hours: Temp:  [98.2 F (36.8 C)-98.6 F (37 C)] 98.2 F (36.8 C) (11/09 0423) Pulse Rate:  [54-64] 56 (11/09 0423) Resp:  [16-17] 16 (11/09 0423) BP: (142-177)/(78-88) 142/82 (11/09 0423) SpO2:  [98 %-99 %] 98 % (11/09 0423) Last BM Date: 10/15/19  Intake/Output from previous day: 11/08 0701 - 11/09 0700 In: 200 [P.O.:200] Out: 2250 [Urine:2250] Intake/Output this shift: No intake/output data recorded.  PE: Gen: NAD Heart: regular Lungs: CTAB Abd: soft, NT, mild bloating, +BS GU: foley in place with clear yellow urine, still numb in perineum  Ext: LUE with dressing in place, c/d/i.  Splint in place to left ankle.  Wiggles toes with sensation, but states they are slightly numb.  Lab Results:  Recent Labs    10/21/19 1027 10/22/19 0407  WBC 10.2 8.6  HGB 11.2* 10.1*  HCT 33.2* 29.5*  PLT 282 295   BMET Recent Labs    10/22/19 0407  NA 137  K 4.2  CL 103  CO2 27  GLUCOSE 111*  BUN 18  CREATININE 0.78  CALCIUM 8.3*   PT/INR No results for input(s): LABPROT, INR in the last 72 hours. CMP     Component Value Date/Time   NA 137 10/22/2019 0407   K 4.2 10/22/2019 0407   CL 103 10/22/2019 0407   CO2 27 10/22/2019 0407   GLUCOSE 111 (H) 10/22/2019 0407   BUN 18 10/22/2019 0407   CREATININE 0.78 10/22/2019 0407   CALCIUM 8.3 (L) 10/22/2019 0407   PROT 6.1 (L) 10/15/2019 1556   ALBUMIN 3.9 10/15/2019 1556   AST 36 10/15/2019 1556   ALT 28 10/15/2019 1556   ALKPHOS 53 10/15/2019 1556   BILITOT 0.6 10/15/2019 1556   GFRNONAA >60 10/22/2019 0407   GFRAA >60 10/22/2019 0407   Lipase  No results found for: LIPASE     Studies/Results: No results found.  Anti-infectives: Anti-infectives (From admission, onward)   Start     Dose/Rate Route Frequency Ordered Stop   10/19/19 1930  clindamycin (CLEOCIN) IVPB 900 mg    Note to Pharmacy: Please give 8 hours from intraoperative dose   900 mg 100 mL/hr over 30 Minutes Intravenous Every 8 hours 10/19/19 1755 10/20/19 1251   10/19/19 1247  vancomycin (VANCOCIN) powder  Status:  Discontinued       As needed 10/19/19 1248 10/19/19 1515   10/19/19 1130  clindamycin (CLEOCIN) IVPB 900 mg     900 mg 100 mL/hr over 30 Minutes Intravenous On call to O.R. 10/19/19 0712 10/19/19 1217   10/16/19 2130  vancomycin (VANCOCIN) IVPB 1000 mg/200 mL premix  Status:  Discontinued     1,000 mg 200 mL/hr over 60 Minutes Intravenous  Once 10/16/19 1704 10/16/19 1737   10/16/19 2130  vancomycin (VANCOCIN) IVPB 1000 mg/200 mL premix  Status:  Discontinued     1,000 mg 200 mL/hr over 60 Minutes Intravenous Every 12 hours 10/16/19 1737 10/17/19 2021   10/16/19 1342  vancomycin (VANCOCIN) powder  Status:  Discontinued  As needed 10/16/19 1342 10/16/19 1431   10/16/19 1049  bacitracin 50,000 Units in sodium chloride 0.9 % 500 mL irrigation  Status:  Discontinued       As needed 10/16/19 1049 10/16/19 1431   10/16/19 0922  vancomycin (VANCOCIN) 1-5 GM/200ML-% IVPB    Note to Pharmacy: Bobbie Stack   : cabinet override      10/16/19 0922 10/16/19 1740       Assessment/Plan Fall from ladder L 1 burst fracture- fusion 11/1 Ronnald Ramp), now with peroneal numbness and urinary retention.Foley in place.On decadron per neuro. LSO brace being fitted just for him.  Mobilize once he gets his brace.PT/OT Neurogenic bowel and bladder - cont foley.  Will give SMOG enema today to help have a BM.  Increase miralax to BID and add colace BID as well as senokot that is already ordered.  Will likely need training in CIR  to help with self cath and self stimulation to have BMs L anterior shoulder dislocation-OR11/4, Dr. Griffin Basil for labral repair and capsulorrhaphy. Must stay in sling in abducted position L ankle trimalleolar fracture-OR11/4,Dr. Haddix for ORIF, NWB, PT/OT ABL anemia - stable FEN -regular diet, colace, miralax BID, senokot, SMOG today Foley - In place for urinary retention, will continue for now VTE -SCD,Lovenox  ID -van11/1-11/2 for back, Cleocin for 3 doses per ortho  Dispo:PT/OT,CIR consult, pain better controlled today with med adjustments   LOS: 9 days    Henreitta Cea , [facility] Surgery 10/24/2019, 8:21 AM Please see Amion for pager number during day hours 7:00am-4:30pm

## 2019-10-24 NOTE — Progress Notes (Signed)
Inpatient Rehabilitation-Admissions Coordinator   Will begin insurance authorization process for possible admit. Pt and wife aware and would like to proceed with CIR once insurance approves.   Jhonnie Garner, OTR/L  Rehab Admissions Coordinator  [phone] 10/24/2019 11:10 AM

## 2019-10-24 NOTE — Progress Notes (Signed)
Physical Therapy Treatment Patient Details Name: Louis Jordan MRN: [id number] DOB: 1956/05/01 Today's Date: 10/24/2019    History of Present Illness Admitted after fall from ladder resulting in L1 burst fracture, now s/p lumbar surgery for fixation (clamshell brace, may don sitting), L ankle comminuted fracture s/p ORIF 11/4, and L shoulder dislocation s/p L arthroscopic labral and capsular repair, loose body removal, and debridement 11/4; Lumbar surgery 11/1    PT Comments    Continuing work on functional mobility and activity tolerance;  Noting improvements in transfers, with pt able to fully clear hips for sit to stand with +2 assist -- still sit to stand and stand pivot are not the best mode of transfer for Louis Jordan right now,  As he tends to put weight on his LLE; Lateral scoot transfers are showing promise, and we took time after getting settled in the recliner to discuss positioning for lateral scooting across drop-arm BSC to get back to bed  Follow Up Recommendations  CIR     Equipment Recommendations  Other (comment)(TBD)    Recommendations for Other Services       Precautions / Restrictions Precautions Precautions: Fall;Back Precaution Comments: L shoulder dislocation - maintain sling; NWB L UE, provided back handout, initiated sling edcuation handout Required Braces or Orthoses: Sling;Spinal Brace Spinal Brace: Applied in sitting position(LSO but pt prefers it be applied in supine ) Restrictions Weight Bearing Restrictions: Yes LUE Weight Bearing: Non weight bearing LLE Weight Bearing: Non weight bearing    Mobility  Bed Mobility Overal bed mobility: Needs Assistance Bed Mobility: Rolling;Supine to Sit Rolling: +2 for safety/equipment;Min assist   Supine to sit: Mod assist;+2 for physical assistance;+2 for safety/equipment     General bed mobility comments: cues for sequencing and assistance required to bring bilat LE/hips to EOB and to elevate trunk into sitting    Transfers Overall transfer level: Needs assistance   Transfers: Sit to/from Stand;Lateral/Scoot Transfers Sit to Stand: Mod assist;+2 physical assistance;+2 safety/equipment;From elevated surface;Max assist        Lateral/Scoot Transfers: Mod assist General transfer comment: Stood from bed with +2 mod assist and R knee blocked; acheived fully upright standign, but tended to put too much weight on LLE in full standing; Basic stand pivot to [facility] with +2 heavy mod assist; then lateral scoot from drop-arm BSC (armrest down) to recliner on Pt's stronger R side; Mod assist for balance, and cues for technqiue  Ambulation/Gait                 Stairs             Wheelchair Mobility    Modified Rankin (Stroke Patients Only)       Balance     Sitting balance-Leahy Scale: Fair                                      Cognition Arousal/Alertness: Awake/alert Behavior During Therapy: WFL for tasks assessed/performed Overall Cognitive Status: Within Functional Limits for tasks assessed                                        Exercises      General Comments General comments (skin integrity, edema, etc.): extra-time spent to help with hygeine after he used the [facility]      Pertinent Vitals/Pain Pain  Assessment: Faces Faces Pain Scale: Hurts even more Pain Location: back, and nausea Pain Descriptors / Indicators: Aching;Sore Pain Intervention(s): Monitored during session    Home Living                      Prior Function            PT Goals (current goals can now be found in the care plan section) Acute Rehab PT Goals Patient Stated Goal: "move the right way" PT Goal Formulation: With patient Time For Goal Achievement: 11/01/19 Potential to Achieve Goals: Good Progress towards PT goals: Progressing toward goals    Frequency    Min 5X/week      PT Plan Current plan remains appropriate    Co-evaluation               AM-PAC PT "6 Clicks" Mobility   Outcome Measure  Help needed turning from your back to your side while in a flat bed without using bedrails?: A Lot Help needed moving from lying on your back to sitting on the side of a flat bed without using bedrails?: A Lot Help needed moving to and from a bed to a chair (including a wheelchair)?: A Lot Help needed standing up from a chair using your arms (e.g., wheelchair or bedside chair)?: A Lot Help needed to walk in hospital room?: Total Help needed climbing 3-5 steps with a railing? : Total 6 Click Score: 10    End of Session Equipment Utilized During Treatment: Gait belt;Back brace;Other (comment)(shoulder sling) Activity Tolerance: Patient tolerated treatment well Patient left: in chair;with call bell/phone within reach;with family/visitor present Nurse Communication: Mobility status PT Visit Diagnosis: Muscle weakness (generalized) (M62.81);Pain Pain - Right/Left: Left Pain - part of body: Shoulder;Leg     Time: 1115-1200(minus approx 5 min while pt on BSC) PT Time Calculation (min) (ACUTE ONLY): 45 min  Charges:  $Therapeutic Activity: 38-52 mins                     Roney Marion, PT  Acute Rehabilitation Services Pager [phone] Office [place] 10/24/2019, 2:37 PM

## 2019-10-24 NOTE — Progress Notes (Signed)
ORTHOPAEDIC PROGRESS NOTE  s/p Procedure(s): SHOULDER ARTHROSCOPY WITH BANKART REPAIR, EXTENSIVE DEBRIDEMENT, LOOSE BODY REMOVAL  SUBJECTIVE: Patient was finishing up with PT upon my arrival. PT was very happy with the progress he has made with transferring. He reports he has had no pain regarding his left shoulder. He denies any instability or re-dislocation. He has been in his sling as instructed. His wife is at his bedside. He may be going to inpatient rehab by Wednesday this week. No chest pain. No SOB. No nausea/vomiting. No other complaints.  OBJECTIVE: PE:  Left shoulder: incisions are CDI, sling well fitting, axillary nerve sensation preserved. + Motor in  AIN, PIN, Ulnar distributions. Sensation intact. Well perfused digits.    Vitals:   10/23/19 2213 10/24/19 0423  BP: (!) 177/88 (!) 142/82  Pulse: (!) 54 (!) 56  Resp: 16 16  Temp: 98.6 F (37 C) 98.2 F (36.8 C)  SpO2: 98% 98%   Imaging: Stable post-op imaging. No signs of dislocation.  ASSESSMENT: Louis Jordan is a 63 y.o. male doing well postoperatively.  PLAN: Weightbearing: NWBLUEfor 6 weeks, ok for wrist and hand ROM but highly unstable shoulder and cannot perform motion at shoulder and for now no more than gentle stretching of elbow Insicional and dressing care: Incisions can be left open to air with dry gauze PRN, leave steri-strips in place Orthopedic device(s): Sling with abduction pillow Showering: Ok to bathe but arm needs to stay in position VTE prophylaxis: per trauma/NS recommendations Pain control: per trauma Follow - up plan: 5 weekswith Dr. Griffin Basil in clinic.  Noemi Chapel, PA-C 10/24/2019

## 2019-10-24 NOTE — Progress Notes (Signed)
Physical Therapy Treatment Patient Details Name: Louis Jordan MRN: [id number] DOB: 08-Oct-1956 Today's Date: 10/24/2019    History of Present Illness Admitted after fall from ladder resulting in L1 burst fracture, now s/p lumbar surgery for fixation (clamshell brace, may don sitting), L ankle comminuted fracture s/p ORIF 11/4, and L shoulder dislocation s/p L arthroscopic labral and capsular repair, loose body removal, and debridement 11/4; Lumbar surgery 11/1    PT Comments    Continuing work on functional mobility and activity tolerance;  Very nice progress with basic lateral scooting transfers; able to use RLE to unweigh hips for small scoots/boosts to R and tansfer recliner to Drop-arm BSC to bed; Continue to recommend comprehensive inpatient rehab (CIR) for post-acute therapy needs.  He is concerned that his brace is very uncomfortable after approx 20-30 minutes of wear; will discuss with one of the Orthotists from [employer]  Follow Up Recommendations  CIR     Equipment Recommendations  Other (comment);Wheelchair (measurements PT);Wheelchair cushion (measurements PT)(TBD)    Recommendations for Other Services       Precautions / Restrictions Precautions Precautions: Fall;Back Precaution Comments: L shoulder dislocation - maintain sling; NWB L UE, provided back handout, initiated sling edcuation handout Required Braces or Orthoses: Sling;Spinal Brace Spinal Brace: Applied in sitting position(LSO but pt prefers it be applied in supine ) Restrictions LUE Weight Bearing: Non weight bearing LLE Weight Bearing: Non weight bearing    Mobility  Bed Mobility Overal bed mobility: Needs Assistance Bed Mobility: Rolling;Supine to Sit Rolling: +2 for safety/equipment;Min assist   Supine to sit: Mod assist;+2 for physical assistance;+2 for safety/equipment     General bed mobility comments: cues for sequencing and assistance required to bring bilat LE/hips to EOB and to elevate trunk into  sitting   Transfers Overall transfer level: Needs assistance   Transfers: Lateral/Scoot Transfers Sit to Stand: Mod assist;+2 physical assistance;+2 safety/equipment;From elevated surface;Max assist        Lateral/Scoot Transfers: Min assist General transfer comment: Performed lateral scoot transfer recliner>droparm BSC>bed; verbal and demo cues for technique an doccasional reminders to keep weight off of LLE; good boost, and use of RLE to unweigh hips for scooting  Ambulation/Gait                 Stairs             Wheelchair Mobility    Modified Rankin (Stroke Patients Only)       Balance     Sitting balance-Leahy Scale: Fair(approaching good) Sitting balance - Comments: Performed R lean and elbow prop while sitting on BSC (drop-arm down) for pericare; able to push back up with RUE                                    Cognition Arousal/Alertness: Awake/alert Behavior During Therapy: WFL for tasks assessed/performed Overall Cognitive Status: Within Functional Limits for tasks assessed                                        Exercises      General Comments General comments (skin integrity, edema, etc.): extra-time spent to help with hygeine after he used the [facility]      Pertinent Vitals/Pain Pain Assessment: Faces Faces Pain Scale: Hurts little more Pain Location: back, and nausea Pain Descriptors / Indicators: Aching;Sore Pain  Intervention(s): Monitored during session    Home Living                      Prior Function            PT Goals (current goals can now be found in the care plan section) Acute Rehab PT Goals Patient Stated Goal: "move the right way" PT Goal Formulation: With patient Time For Goal Achievement: 11/01/19 Potential to Achieve Goals: Good Progress towards PT goals: Progressing toward goals    Frequency    Min 5X/week      PT Plan Current plan remains appropriate     Co-evaluation              AM-PAC PT "6 Clicks" Mobility   Outcome Measure  Help needed turning from your back to your side while in a flat bed without using bedrails?: A Lot Help needed moving from lying on your back to sitting on the side of a flat bed without using bedrails?: A Lot Help needed moving to and from a bed to a chair (including a wheelchair)?: A Little Help needed standing up from a chair using your arms (e.g., wheelchair or bedside chair)?: A Lot Help needed to walk in hospital room?: Total Help needed climbing 3-5 steps with a railing? : Total 6 Click Score: 11    End of Session Equipment Utilized During Treatment: Gait belt;Back brace;Other (comment)(shoulder sling) Activity Tolerance: Patient tolerated treatment well Patient left: in bed;with call bell/phone within reach;with family/visitor present;Other (comment)(sitting EOB with NT) Nurse Communication: Mobility status PT Visit Diagnosis: Muscle weakness (generalized) (M62.81);Pain Pain - Right/Left: Left Pain - part of body: Shoulder;Leg     Time: 3875-6433 PT Time Calculation (min) (ACUTE ONLY): 15 min  Charges:  $Therapeutic Activity: 8-22 mins                     Roney Marion, PT  Acute Rehabilitation Services Pager [phone] Office [place] 10/24/2019, 4:17 PM

## 2019-10-25 ENCOUNTER — Encounter (HOSPITAL_COMMUNITY): Payer: Self-pay | Admitting: Physical Medicine and Rehabilitation

## 2019-10-25 ENCOUNTER — Inpatient Hospital Stay (HOSPITAL_COMMUNITY): Payer: Commercial Managed Care - PPO

## 2019-10-25 ENCOUNTER — Other Ambulatory Visit: Payer: Self-pay

## 2019-10-25 ENCOUNTER — Inpatient Hospital Stay (HOSPITAL_COMMUNITY)
Admit: 2019-10-25 | Discharge: 2019-11-08 | DRG: 560 | Disposition: A | Discharge status: Home / Self Care | Payer: Commercial Managed Care - PPO | Source: Intra-hospital | Attending: Physical Medicine and Rehabilitation | Admitting: Physical Medicine and Rehabilitation

## 2019-10-25 ENCOUNTER — Inpatient Hospital Stay (HOSPITAL_COMMUNITY)
Admission: RE | Admit: 2019-10-25 | Payer: PRIVATE HEALTH INSURANCE | Source: Intra-hospital | Admitting: Physical Medicine and Rehabilitation

## 2019-10-25 ENCOUNTER — Encounter (HOSPITAL_COMMUNITY): Payer: Self-pay

## 2019-10-25 DIAGNOSIS — W11XXXD Fall on and from ladder, subsequent encounter: Secondary | ICD-10-CM | POA: Diagnosis present

## 2019-10-25 DIAGNOSIS — B965 Pseudomonas (aeruginosa) (mallei) (pseudomallei) as the cause of diseases classified elsewhere: Secondary | ICD-10-CM | POA: Diagnosis present

## 2019-10-25 DIAGNOSIS — Z8249 Family history of ischemic heart disease and other diseases of the circulatory system: Secondary | ICD-10-CM

## 2019-10-25 DIAGNOSIS — S82852D Displaced trimalleolar fracture of left lower leg, subsequent encounter for closed fracture with routine healing: Secondary | ICD-10-CM | POA: Diagnosis not present

## 2019-10-25 DIAGNOSIS — T148XXA Other injury of unspecified body region, initial encounter: Secondary | ICD-10-CM

## 2019-10-25 DIAGNOSIS — S32001S Stable burst fracture of unspecified lumbar vertebra, sequela: Secondary | ICD-10-CM | POA: Diagnosis not present

## 2019-10-25 DIAGNOSIS — S343XXA Injury of cauda equina, initial encounter: Secondary | ICD-10-CM | POA: Diagnosis present

## 2019-10-25 DIAGNOSIS — D62 Acute posthemorrhagic anemia: Secondary | ICD-10-CM | POA: Diagnosis present

## 2019-10-25 DIAGNOSIS — S43005A Unspecified dislocation of left shoulder joint, initial encounter: Secondary | ICD-10-CM

## 2019-10-25 DIAGNOSIS — K5900 Constipation, unspecified: Secondary | ICD-10-CM

## 2019-10-25 DIAGNOSIS — S43492S Other sprain of left shoulder joint, sequela: Secondary | ICD-10-CM | POA: Diagnosis not present

## 2019-10-25 DIAGNOSIS — S43015D Anterior dislocation of left humerus, subsequent encounter: Secondary | ICD-10-CM | POA: Diagnosis not present

## 2019-10-25 DIAGNOSIS — N319 Neuromuscular dysfunction of bladder, unspecified: Secondary | ICD-10-CM | POA: Diagnosis present

## 2019-10-25 DIAGNOSIS — Z800 Family history of malignant neoplasm of digestive organs: Secondary | ICD-10-CM

## 2019-10-25 DIAGNOSIS — R339 Retention of urine, unspecified: Secondary | ICD-10-CM | POA: Diagnosis present

## 2019-10-25 DIAGNOSIS — S343XXD Injury of cauda equina, subsequent encounter: Secondary | ICD-10-CM

## 2019-10-25 DIAGNOSIS — N390 Urinary tract infection, site not specified: Secondary | ICD-10-CM | POA: Diagnosis present

## 2019-10-25 DIAGNOSIS — K592 Neurogenic bowel, not elsewhere classified: Secondary | ICD-10-CM | POA: Diagnosis present

## 2019-10-25 DIAGNOSIS — K644 Residual hemorrhoidal skin tags: Secondary | ICD-10-CM | POA: Diagnosis present

## 2019-10-25 DIAGNOSIS — S82852A Displaced trimalleolar fracture of left lower leg, initial encounter for closed fracture: Secondary | ICD-10-CM | POA: Diagnosis present

## 2019-10-25 DIAGNOSIS — S43492A Other sprain of left shoulder joint, initial encounter: Secondary | ICD-10-CM | POA: Diagnosis present

## 2019-10-25 DIAGNOSIS — S24103A Unspecified injury at T7-T10 level of thoracic spinal cord, initial encounter: Secondary | ICD-10-CM | POA: Insufficient documentation

## 2019-10-25 DIAGNOSIS — S32011D Stable burst fracture of first lumbar vertebra, subsequent encounter for fracture with routine healing: Principal | ICD-10-CM

## 2019-10-25 DIAGNOSIS — G588 Other specified mononeuropathies: Secondary | ICD-10-CM | POA: Diagnosis present

## 2019-10-25 DIAGNOSIS — E8809 Other disorders of plasma-protein metabolism, not elsewhere classified: Secondary | ICD-10-CM

## 2019-10-25 DIAGNOSIS — M792 Neuralgia and neuritis, unspecified: Secondary | ICD-10-CM

## 2019-10-25 DIAGNOSIS — S32001A Stable burst fracture of unspecified lumbar vertebra, initial encounter for closed fracture: Secondary | ICD-10-CM | POA: Diagnosis present

## 2019-10-25 DIAGNOSIS — T380X5A Adverse effect of glucocorticoids and synthetic analogues, initial encounter: Secondary | ICD-10-CM | POA: Diagnosis present

## 2019-10-25 DIAGNOSIS — D72829 Elevated white blood cell count, unspecified: Secondary | ICD-10-CM | POA: Diagnosis present

## 2019-10-25 DIAGNOSIS — Z820 Family history of epilepsy and other diseases of the nervous system: Secondary | ICD-10-CM

## 2019-10-25 DIAGNOSIS — S82892A Other fracture of left lower leg, initial encounter for closed fracture: Secondary | ICD-10-CM

## 2019-10-25 DIAGNOSIS — G834 Cauda equina syndrome: Secondary | ICD-10-CM | POA: Diagnosis present

## 2019-10-25 DIAGNOSIS — R739 Hyperglycemia, unspecified: Secondary | ICD-10-CM | POA: Diagnosis present

## 2019-10-25 DIAGNOSIS — E46 Unspecified protein-calorie malnutrition: Secondary | ICD-10-CM | POA: Diagnosis present

## 2019-10-25 MED ORDER — GABAPENTIN 100 MG PO CAPS
200.0000 mg | ORAL_CAPSULE | Freq: Two times a day (BID) | ORAL | Status: DC
  Administered 2019-10-25: 200 mg via ORAL
  Filled 2019-10-25: qty 2

## 2019-10-25 MED ORDER — DEXAMETHASONE SODIUM PHOSPHATE 4 MG/ML IJ SOLN
4.0000 mg | Freq: Four times a day (QID) | INTRAMUSCULAR | Status: DC
  Filled 2019-10-25 (×56): qty 1

## 2019-10-25 MED ORDER — ONDANSETRON HCL 4 MG PO TABS: 4.0000 mg | ORAL_TABLET | Freq: Four times a day (QID) | ORAL | Status: DC | PRN

## 2019-10-25 MED ORDER — ACETAMINOPHEN 325 MG PO TABS
650.0000 mg | ORAL_TABLET | Freq: Four times a day (QID) | ORAL | Status: DC
  Administered 2019-10-25 – 2019-11-08 (×54): 650 mg via ORAL
  Filled 2019-10-25 (×55): qty 2

## 2019-10-25 MED ORDER — DIPHENHYDRAMINE HCL 12.5 MG/5ML PO ELIX: 12.5000 mg | ORAL_SOLUTION | Freq: Four times a day (QID) | ORAL | Status: DC | PRN

## 2019-10-25 MED ORDER — TRAMADOL HCL 50 MG PO TABS
50.0000 mg | ORAL_TABLET | Freq: Four times a day (QID) | ORAL | Status: DC
  Administered 2019-10-25 – 2019-11-08 (×54): 50 mg via ORAL
  Filled 2019-10-25 (×57): qty 1

## 2019-10-25 MED ORDER — FLEET ENEMA 7-19 GM/118ML RE ENEM: 1.0000 | ENEMA | Freq: Once | RECTAL | Status: DC | PRN

## 2019-10-25 MED ORDER — ENOXAPARIN SODIUM 40 MG/0.4ML ~~LOC~~ SOLN
40.0000 mg | SUBCUTANEOUS | Status: DC
  Administered 2019-10-26 – 2019-11-08 (×14): 40 mg via SUBCUTANEOUS
  Filled 2019-10-25 (×16): qty 0.4

## 2019-10-25 MED ORDER — ONDANSETRON HCL 4 MG/2ML IJ SOLN: 4.0000 mg | Freq: Four times a day (QID) | INTRAMUSCULAR | Status: DC | PRN

## 2019-10-25 MED ORDER — ALUM & MAG HYDROXIDE-SIMETH 200-200-20 MG/5ML PO SUSP: 30.0000 mL | ORAL | Status: DC | PRN

## 2019-10-25 MED ORDER — PROCHLORPERAZINE 25 MG RE SUPP: 12.5000 mg | Freq: Four times a day (QID) | RECTAL | Status: DC | PRN

## 2019-10-25 MED ORDER — ACETAMINOPHEN 325 MG PO TABS
325.0000 mg | ORAL_TABLET | ORAL | Status: DC | PRN
  Administered 2019-10-26 – 2019-10-31 (×3): 650 mg via ORAL
  Filled 2019-10-25: qty 2

## 2019-10-25 MED ORDER — PROCHLORPERAZINE MALEATE 5 MG PO TABS
5.0000 mg | ORAL_TABLET | Freq: Four times a day (QID) | ORAL | Status: DC | PRN
  Administered 2019-10-25: 5 mg via ORAL
  Filled 2019-10-25: qty 1

## 2019-10-25 MED ORDER — SENNA 8.6 MG PO TABS
1.0000 | ORAL_TABLET | Freq: Two times a day (BID) | ORAL | Status: DC
  Administered 2019-10-25 – 2019-11-08 (×27): 8.6 mg via ORAL
  Filled 2019-10-25 (×28): qty 1

## 2019-10-25 MED ORDER — POLYETHYLENE GLYCOL 3350 17 G PO PACK
17.0000 g | PACK | Freq: Two times a day (BID) | ORAL | Status: DC
  Administered 2019-10-25 – 2019-11-08 (×26): 17 g via ORAL
  Filled 2019-10-25 (×28): qty 1

## 2019-10-25 MED ORDER — METHOCARBAMOL 500 MG PO TABS
500.0000 mg | ORAL_TABLET | Freq: Four times a day (QID) | ORAL | Status: DC
  Administered 2019-10-25 – 2019-11-08 (×56): 500 mg via ORAL
  Filled 2019-10-25 (×56): qty 1

## 2019-10-25 MED ORDER — HYDROCORTISONE (PERIANAL) 2.5 % EX CREA
TOPICAL_CREAM | Freq: Three times a day (TID) | CUTANEOUS | Status: DC
  Administered 2019-10-26 – 2019-10-29 (×8): via RECTAL
  Administered 2019-10-29: 1 via RECTAL
  Administered 2019-10-29 – 2019-11-03 (×12): via RECTAL
  Administered 2019-11-04: 1 via RECTAL
  Administered 2019-11-04 – 2019-11-08 (×10): via RECTAL
  Filled 2019-10-25 (×3): qty 28.35

## 2019-10-25 MED ORDER — DOCUSATE SODIUM 100 MG PO CAPS
100.0000 mg | ORAL_CAPSULE | Freq: Two times a day (BID) | ORAL | Status: DC
  Administered 2019-10-26 – 2019-11-08 (×24): 100 mg via ORAL
  Filled 2019-10-25 (×27): qty 1

## 2019-10-25 MED ORDER — GUAIFENESIN-DM 100-10 MG/5ML PO SYRP: 5.0000 mL | ORAL_SOLUTION | Freq: Four times a day (QID) | ORAL | Status: DC | PRN

## 2019-10-25 MED ORDER — PROCHLORPERAZINE EDISYLATE 10 MG/2ML IJ SOLN: 5.0000 mg | Freq: Four times a day (QID) | INTRAMUSCULAR | Status: DC | PRN

## 2019-10-25 MED ORDER — CHLORHEXIDINE GLUCONATE CLOTH 2 % EX PADS
6.0000 | MEDICATED_PAD | Freq: Every day | CUTANEOUS | Status: DC
  Administered 2019-10-26: 6 via TOPICAL

## 2019-10-25 MED ORDER — DEXAMETHASONE 4 MG PO TABS
4.0000 mg | ORAL_TABLET | Freq: Four times a day (QID) | ORAL | Status: DC
  Administered 2019-10-25 – 2019-11-08 (×55): 4 mg via ORAL
  Filled 2019-10-25 (×55): qty 1

## 2019-10-25 MED ORDER — TRAZODONE HCL 50 MG PO TABS: 25.0000 mg | ORAL_TABLET | Freq: Every evening | ORAL | Status: DC | PRN

## 2019-10-25 MED ORDER — POLYETHYLENE GLYCOL 3350 17 G PO PACK: 17.0000 g | PACK | Freq: Every day | ORAL | Status: DC | PRN

## 2019-10-25 MED ORDER — BISACODYL 10 MG RE SUPP
10.0000 mg | Freq: Every day | RECTAL | Status: DC | PRN
  Administered 2019-10-29 – 2019-10-31 (×2): 10 mg via RECTAL
  Filled 2019-10-25 (×2): qty 1

## 2019-10-25 MED ORDER — OXYCODONE HCL 5 MG PO TABS
10.0000 mg | ORAL_TABLET | ORAL | Status: DC | PRN
  Administered 2019-10-25 – 2019-10-27 (×7): 10 mg via ORAL
  Filled 2019-10-25 (×8): qty 2

## 2019-10-25 MED ORDER — GABAPENTIN 100 MG PO CAPS
200.0000 mg | ORAL_CAPSULE | Freq: Three times a day (TID) | ORAL | Status: DC
  Administered 2019-10-25 – 2019-10-27 (×5): 200 mg via ORAL
  Filled 2019-10-25 (×5): qty 2

## 2019-10-25 MED ORDER — BISACODYL 10 MG RE SUPP
10.0000 mg | Freq: Every day | RECTAL | Status: DC
  Administered 2019-10-26 – 2019-11-07 (×13): 10 mg via RECTAL
  Filled 2019-10-25 (×12): qty 1

## 2019-10-25 NOTE — Progress Notes (Signed)
Patient transferred to the rehab unit, report given to receiving nurse.

## 2019-10-25 NOTE — Progress Notes (Signed)
Patient ID: Louis Jordan, male   DOB: Dec 15, 1956, 63 y.o.   MRN: [id number]    6 Days Post-Op  Subjective: No BM yesterday after SMOG.  Appetite still down some.  Back brace is really bothering him.  ROS: See above, otherwise other systems negative  Objective: Vital signs in last 24 hours: Temp:  [98.2 F (36.8 C)-98.5 F (36.9 C)] 98.2 F (36.8 C) (11/10 0522) Pulse Rate:  [60-61] 60 (11/10 0522) Resp:  [18] 18 (11/09 2033) BP: (159-166)/(90-97) 164/91 (11/10 0522) SpO2:  [97 %-99 %] 99 % (11/10 0522) Last BM Date: 10/15/19  Intake/Output from previous day: 11/09 0701 - 11/10 0700 In: 980 [P.O.:980] Out: 1575 [Urine:1575] Intake/Output this shift: No intake/output data recorded.  PE: Gen: NAD Heart: regular Lungs: CTAB Abd: soft, NT, mild bloating, +BS GU: foley in place with clear yellow urine, still numb in perineum  Ext: LUE wounds c/d/i. Splint in place to left ankle. Wiggles toes with sensation, but states they are slightly numb.  Lab Results:  No results for input(s): WBC, HGB, HCT, PLT in the last 72 hours. BMET No results for input(s): NA, K, CL, CO2, GLUCOSE, BUN, CREATININE, CALCIUM in the last 72 hours. PT/INR No results for input(s): LABPROT, INR in the last 72 hours. CMP     Component Value Date/Time   NA 137 10/22/2019 0407   K 4.2 10/22/2019 0407   CL 103 10/22/2019 0407   CO2 27 10/22/2019 0407   GLUCOSE 111 (H) 10/22/2019 0407   BUN 18 10/22/2019 0407   CREATININE 0.78 10/22/2019 0407   CALCIUM 8.3 (L) 10/22/2019 0407   PROT 6.1 (L) 10/15/2019 1556   ALBUMIN 3.9 10/15/2019 1556   AST 36 10/15/2019 1556   ALT 28 10/15/2019 1556   ALKPHOS 53 10/15/2019 1556   BILITOT 0.6 10/15/2019 1556   GFRNONAA >60 10/22/2019 0407   GFRAA >60 10/22/2019 0407   Lipase  No results found for: LIPASE     Studies/Results: Dg Shoulder Left  Result Date: 10/24/2019 CLINICAL DATA:  Postop for left shoulder arthroscopy 5 days ago. EXAM: LEFT SHOULDER - 2+  VIEW COMPARISON:  10/17/2019 FINDINGS: Mild degenerative changes the owner surface of the acromioclavicular joint. Lower cervical spine fixation. Transverse aortic atherosclerosis. Given limitations of patient positioning and support apparatus, no fracture or dislocation. Possible osseous irregularity involving the inferior glenoid, suboptimally evaluated. IMPRESSION: No acute osseous abnormality. Electronically Signed   By: Jeronimo Greaves M.D.   On: 10/24/2019 14:19    Anti-infectives: Anti-infectives (From admission, onward)   Start     Dose/Rate Route Frequency Ordered Stop   10/19/19 1930  clindamycin (CLEOCIN) IVPB 900 mg    Note to Pharmacy: Please give 8 hours from intraoperative dose   900 mg 100 mL/hr over 30 Minutes Intravenous Every 8 hours 10/19/19 1755 10/20/19 1251   10/19/19 1247  vancomycin (VANCOCIN) powder  Status:  Discontinued       As needed 10/19/19 1248 10/19/19 1515   10/19/19 1130  clindamycin (CLEOCIN) IVPB 900 mg     900 mg 100 mL/hr over 30 Minutes Intravenous On call to O.R. 10/19/19 0712 10/19/19 1217   10/16/19 2130  vancomycin (VANCOCIN) IVPB 1000 mg/200 mL premix  Status:  Discontinued     1,000 mg 200 mL/hr over 60 Minutes Intravenous  Once 10/16/19 1704 10/16/19 1737   10/16/19 2130  vancomycin (VANCOCIN) IVPB 1000 mg/200 mL premix  Status:  Discontinued     1,000 mg 200 mL/hr over 60  Minutes Intravenous Every 12 hours 10/16/19 1737 10/17/19 2021   10/16/19 1342  vancomycin (VANCOCIN) powder  Status:  Discontinued       As needed 10/16/19 1342 10/16/19 1431   10/16/19 1049  bacitracin 50,000 Units in sodium chloride 0.9 % 500 mL irrigation  Status:  Discontinued       As needed 10/16/19 1049 10/16/19 1431   10/16/19 0922  vancomycin (VANCOCIN) 1-5 GM/200ML-% IVPB    Note to Pharmacy: Bobbie Stack   : cabinet override      10/16/19 0922 10/16/19 1740       Assessment/Plan Fall from ladder L 1 burst fracture- fusion 11/1 Ronnald Ramp), now with peroneal  numbness and urinary retention.Foley in place.On decadron per neuro.LSO brace being fitted just for him. Mobilize once he gets his brace.PT/OT Neurogenic bowel and bladder - cont foley.  Will give SMOG enema today to help have a BM.  Increase miralax to BID and add colace BID as well as senokot that is already ordered.  Will likely need training in CIR to help with self cath and self stimulation to have BMs L anterior shoulder dislocation-OR11/4, Dr. Griffin Basil for labral repair and capsulorrhaphy. Must stay in sling in abducted position L ankle trimalleolar fracture-OR11/4,Dr. Haddix for ORIF, NWB, PT/OT ABL anemia- stable FEN -regular diet, colace, miralax BID, senokot, SMOG today Foley - In place for urinary retention, will continue for now VTE -SCD,Lovenox ID -van11/1-11/2 for back, Cleocin for 3 doses per ortho  Dispo:CIR pending, bowel program needed, suppository 4x/day to help with bowel stimulation   LOS: 10 days    Henreitta Cea , [facility] Surgery 10/25/2019, 8:22 AM Please see Amion for pager number during day hours 7:00am-4:30pm

## 2019-10-25 NOTE — Progress Notes (Signed)
Physical Therapy Treatment Patient Details Name: Louis Jordan MRN: [id number] DOB: 1956/06/20 Today's Date: 10/25/2019    History of Present Illness Admitted after fall from ladder resulting in L1 burst fracture, now s/p lumbar surgery for fixation (clamshell brace, may don sitting), L ankle comminuted fracture s/p ORIF 11/4, and L shoulder dislocation s/p L arthroscopic labral and capsular repair, loose body removal, and debridement 11/4; Lumbar surgery 11/1    PT Comments    Continuing work on functional mobility and activity tolerance;  Noting very nice progress with functional lateral scoot transfers today compared to yesterday; Able to transfer bed to drop-arm BSC to recliner with min assist and close guard for NWB LLE; Worked on sit to stand on RLE and standing tolerance, hip extension in R single limb stance with L knee propped on bed (in lowest position directly in front of him)   Hopeful for move to CIR today  Follow Up Recommendations  CIR     Equipment Recommendations  Wheelchair (measurements PT);Wheelchair cushion (measurements PT)    Recommendations for Other Services       Precautions / Restrictions Precautions Precautions: Fall;Back Precaution Comments: L shoulder dislocation - maintain sling; NWB L UE, provided back handout, initiated sling edcuation handout Required Braces or Orthoses: Sling;Spinal Brace Spinal Brace: Applied in sitting position(LSO but pt prefers it be applied in supine ) Restrictions LUE Weight Bearing: Non weight bearing LLE Weight Bearing: Non weight bearing    Mobility  Bed Mobility Overal bed mobility: Needs Assistance Bed Mobility: Rolling;Sidelying to Sit Rolling: Min assist;+2 for safety/equipment Sidelying to sit: Mod assist       General bed mobility comments: cues for sequencing and assistance required to bring bilat LE/hips to EOB and to elevate trunk into sitting; light mod, almost min assist to push up to sit -- good push with  R UE  Transfers Overall transfer level: Needs assistance   Transfers: Lateral/Scoot Transfers;Sit to/from Stand Sit to Stand: Mod assist;+2 physical assistance;+2 safety/equipment;From elevated surface;Max assist        Lateral/Scoot Transfers: Min assist;Min guard General transfer comment: Performed lateral scoot transfer bed>droparm BSC>recliner; verbal and demo cues for technique an doccasional reminders to keep weight off of LLE; good boost, and use of RLE to unweigh hips for scooting; Then worked on single limb standing and activity tolerance; Stood from recliner directly in front of bed (in lowest position), and propped L knee on bed; cues to fully extend R knee in standing and fully extend hips  Ambulation/Gait                 Stairs             Wheelchair Mobility    Modified Rankin (Stroke Patients Only)       Balance     Sitting balance-Leahy Scale: Fair(approaching good) Sitting balance - Comments: Performed R lean and elbow prop while sitting on BSC (drop-arm down) for pericare; able to push back up with RUE                                    Cognition Arousal/Alertness: Awake/alert Behavior During Therapy: WFL for tasks assessed/performed Overall Cognitive Status: Within Functional Limits for tasks assessed  Exercises      General Comments General comments (skin integrity, edema, etc.): Small BM sitting on BSC; [name], Nursing Student present and assisted with hygeine      Pertinent Vitals/Pain Pain Assessment: Faces Faces Pain Scale: Hurts little more Pain Location: back pain and brace discomfort Pain Descriptors / Indicators: Aching;Sore Pain Intervention(s): Premedicated before session    Home Living                      Prior Function            PT Goals (current goals can now be found in the care plan section) Acute Rehab PT Goals Patient Stated  Goal: "move the right way" and very much wants to move his bowels PT Goal Formulation: With patient Time For Goal Achievement: 11/01/19 Potential to Achieve Goals: Good Progress towards PT goals: Progressing toward goals    Frequency    Min 5X/week      PT Plan Current plan remains appropriate    Co-evaluation              AM-PAC PT "6 Clicks" Mobility   Outcome Measure  Help needed turning from your back to your side while in a flat bed without using bedrails?: A Little Help needed moving from lying on your back to sitting on the side of a flat bed without using bedrails?: A Little Help needed moving to and from a bed to a chair (including a wheelchair)?: A Little Help needed standing up from a chair using your arms (e.g., wheelchair or bedside chair)?: A Lot Help needed to walk in hospital room?: Total Help needed climbing 3-5 steps with a railing? : Total 6 Click Score: 13    End of Session Equipment Utilized During Treatment: Gait belt;Back brace;Other (comment)(shoulder sling) Activity Tolerance: Patient tolerated treatment well Patient left: in chair;with call bell/phone within reach;Other (comment)(Nursing Student in room) Nurse Communication: Mobility status PT Visit Diagnosis: Muscle weakness (generalized) (M62.81);Pain Pain - Right/Left: Left Pain - part of body: Shoulder;Leg     Time: 0905-0950(minus approx 10 minutes while pt on the commode) PT Time Calculation (min) (ACUTE ONLY): 45 min  Charges:  $Therapeutic Activity: 23-37 mins                     Roney Marion, PT  Acute Rehabilitation Services Pager [phone] Office [phone]    Colletta [place] 10/25/2019, 11:14 AM

## 2019-10-25 NOTE — H&P (Signed)
Physical Medicine and Rehabilitation Admission H&P    Chief Complaint  Patient presents with  . Fall with L1 burst fracture and ankle fracture    HPI: Louis Jordan is a 63 year old R handed male with who fell 12 feet off a ladder on 10/15/19 with subsequent L1 burst fracture with retropulsion, left anterior shoulder dislocation and left trimalleolar ankle fracture. Shoulder reduced by trauma with sling recommended and left ankle splinted. He was taken to OR for ORIF L1 fracture with decompressive laminectomy with removal of epidural hematoma, medial facetectomy and foraminotomy with posterior fixation T10- L4 and intertransverse arthrodesis of L4 by Dr. Yetta Barre.   Noted to have perineal numbness due to conus injury with neurogenic bowel and bladder. He did have repeat dislocation of left shoulder requiring reduction 11/2 and Dr. Everardo Pacific consulted who felt that patient with Bankart injury --arthroscopic stabilization recommended. He was taken to OR 11/4 for arthroscopy with removal of loose bodies and labral repair and capsulorrhaphy as well as ORIF left trimalleolar ankle fracture by Dr. Jena Gauss.  Post op NWB LLE and  NWB LUE for  6 weeks with gentle stretching of elbow due to highly unstable shoulder.     Custom LSO ordered for back support as sling inhibits TLSO use. Foley in place due to urinary retention. He continues on decadron 4 mg qid with elevated fasting BS. He continues to have saddle anesthesia and received SMOG enema 11/09 and on dulcolax suppositories bid.  He reports saddle anesthesia with abdominal pain, LLE > back pain that is constant. Is tolerating increase in activity but does get nauseous with fatigue. 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 CIR recommended due to functional decline.    Pt had 1 small-medium BM in last 9+ days- feels abdominal pain- they thought he was having bladder spasms earlier this week and started Oxybutynin- sounds like  gas/constipation pain, not bladder spasms, although hard to be sure. Pt could benefit from removing foley after admission and starting Flomax to see if can get him voiding. Will need bowel program.   Also started gabapentin for nerve pain he's having.   Review of Systems  Constitutional: Negative for chills and fever.  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 Negative for hearing loss.   Eyes: Negative for blurred vision and double vision.  Respiratory: Negative for cough and shortness of breath.   Cardiovascular: Negative for chest pain, palpitations and leg swelling.  Gastrointestinal: Positive for abdominal pain, constipation and nausea (when up with therapy). Negative for heartburn and vomiting.  Musculoskeletal: Positive for back pain and myalgias.       Pain constant at 7- 8 leg > back.   Skin: Negative for itching and rash.  Neurological: Positive for dizziness (transient), sensory change (doesn't feel anything between his legs. Decreased sensation bilateral buttocks. ) and weakness.  Psychiatric/Behavioral: The patient is nervous/anxious and has insomnia (pain worsens when meds run out of system. ).   All other systems reviewed and are negative.    Past Medical History:  Diagnosis Date  . Bundle branch block, right   . Complication of anesthesia   . Hand pain    mild OA bilateral  hands  . Left knee injury 1970   requiring surgery for repair of ligaments/cartilage damage with nerve injury that took 20 years to resolve  . MVP (mitral valve prolapse)   . PONV (postoperative nausea and vomiting)  Past Surgical History:  Procedure Laterality Date  . APPENDECTOMY    . KNEE SURGERY Right   . LUMBAR SPINE SURGERY  10/16/2019   L1 burst fracture with epidural hematoma and canal stenosis  . OPEN REDUCTION INTERNAL FIXATION (ORIF) TIBIA/FIBULA FRACTURE Left 10/19/2019   Procedure: OPEN REDUCTION INTERNAL FIXATION (ORIF) TIBIA/FIBULA FRACTURE;   Surgeon: Shona Needles, MD;  Location: [facility];  Service: Orthopedics;  Laterality: Left;  . SHOULDER ARTHROSCOPY WITH BANKART REPAIR Left 10/19/2019   Procedure: SHOULDER ARTHROSCOPY WITH BANKART REPAIR, EXTENSIVE DEBRIDEMENT, LOOSE BODY REMOVAL;  Surgeon: Hiram Gash, MD;  Location: [place];  Service: Orthopedics;  Laterality: Left;  . STRABISMUS SURGERY    . TONSILLECTOMY      Family History  Problem Relation Age of Onset  . Heart disease Mother   . Alzheimer's disease Mother   . Pancreatic cancer Father   . High blood pressure Brother      Social History:  Married. 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 active and worked out 5 days a week. He reports that he has never smoked. He has never used smokeless tobacco. He has not used alcohol in some time--wine on rare occasions.  He reports that he does not use drugs. Lives in 2 story house with 1-3 STE depending on entrance; can live on 1st floor.   Allergies  Allergen Reactions  . Penicillins Swelling    Medications Prior to Admission  Medication Sig Dispense Refill  . ibuprofen (ADVIL) 200 MG tablet Take 400 mg by mouth every 6 (six) hours as needed for headache.    . Multiple Vitamins-Minerals (MULTIVITAMIN ADULT PO) Take 1 tablet by mouth daily.    . Naproxen Sod-diphenhydrAMINE (ALEVE PM PO) Take 2 tablets by mouth as needed (sleep).      Drug Regimen Review  Drug regimen was reviewed and remains appropriate with no significant issues identified  Home: Home Living Family/patient expects to be discharged to:: Private residence Living Arrangements: Spouse/significant other Available Help at Discharge: Family, Available 24 hours/day Type of Home: House Home Access: Stairs to enter Home Layout: One level Bathroom Shower/Tub: Multimedia programmer: Standard Home Equipment: None   Functional History: Prior Function Level of Independence: Independent  Functional Status:  Mobility: Bed  Mobility Overal bed mobility: Needs Assistance Bed Mobility: Rolling, Sidelying to Sit Rolling: Min assist, +2 for safety/equipment Sidelying to sit: Mod assist Supine to sit: Mod assist, +2 for physical assistance, +2 for safety/equipment Sit to supine: Mod assist, +2 for physical assistance, +2 for safety/equipment General bed mobility comments: cues for sequencing and assistance required to bring bilat LE/hips to EOB and to elevate trunk into sitting; light mod, almost min assist to push up to sit -- good push with R UE Transfers Overall transfer level: Needs assistance Transfers: Lateral/Scoot Transfers, Sit to/from Stand Sit to Stand: Mod assist, +2 physical assistance, +2 safety/equipment, From elevated surface, Max assist  Lateral/Scoot Transfers: Min assist, Min guard General transfer comment: Performed lateral scoot transfer bed>droparm BSC>recliner; verbal and demo cues for technique an doccasional reminders to keep weight off of LLE; good boost, and use of RLE to unweigh hips for scooting; Then worked on single limb standing and activity tolerance; Stood from recliner directly in front of bed (in lowest position), and propped L knee on bed; cues to fully extend R knee in standing and fully extend hips      ADL: ADL Overall ADL's : Needs assistance/impaired Eating/Feeding: Independent,  Sitting, Bed level Grooming: Wash/dry hands, Therapist, nutritionalWash/dry face, Min guard, Sitting Upper Body Bathing: Min guard, Sitting Lower Body Bathing: Maximal assistance Upper Body Dressing : Min guard, Cueing for safety Lower Body Dressing: Total assistance Toilet Transfer Details (indicate cue type and reason): not attempted Toileting- Clothing Manipulation and Hygiene: Total assistance, Bed level General ADL Comments: limited to fear within the session but complete sit to stands with +2 and varrying level of assist from raised surface   Cognition: Cognition Overall Cognitive Status: Within Functional  Limits for tasks assessed Orientation Level: Oriented X4 Cognition Arousal/Alertness: Awake/alert Behavior During Therapy: WFL for tasks assessed/performed Overall Cognitive Status: Within Functional Limits for tasks assessed General Comments: anxious    Blood pressure (!) 164/91, pulse 60, temperature 98.2 F (36.8 C), temperature source Oral, resp. rate 18, height 6' (1.829 m), weight 70.3 kg, SpO2 99 %. Physical Exam  Nursing note and vitals reviewed. Constitutional: He is oriented to person, place, and time. He appears well-developed and well-nourished. No distress.  Fatigued appearing. Lying supine in bed; likes bed flat; wife at bedside, NAD  HENT:  Head: Normocephalic and atraumatic.  CN's intact by exam- facial sensation intact; no asymmetry; tongue midline  Eyes: Pupils are equal, round, and reactive to light. EOM are normal. Right eye exhibits no discharge. Left eye exhibits no discharge. No scleral icterus.  Neck: Normal range of motion. Neck supple.  Cardiovascular:  RRR- no M/R/G  Respiratory: No stridor.  CTA B/L- no W/R/R  GI: Soft. He exhibits no distension. There is abdominal tenderness.  Very hypoactive BS; TTP diffusely; mild distension; slightly firmer due to severe constipation  Genitourinary:    Genitourinary Comments: Foley in place. External hemorrhoids noted. .    Musculoskeletal:     Comments: Left shoulder incision C/D/I. Splint in place left ankle.   RUE biceps, triceps, WE, grip and finger bad 5/5 LUE- can only test grip/finger abd which were 5/5 RLE- HF 5-/5, KE 5-/5, DF 5-/5, PF 5-/5, EHL 4/5 LLE- HF 5-/5, KE 5-/5- EHL 4/5; couldn't test at ankle due to fx/cast   Neurological: He is alert and oriented to person, place, and time.  Intact sensation C1-T12 B/L RLE- Decreased sensation at L1 on R; L2/L3 nml, L4- S2 decreased on RLE LLE- L1, L2, L3 nml sensation; cannot test L4/L5 due to cast; S1/S2 decreased on LLE Buttocks- S3 decreased, S4 really  decreased and S5 absent B/L    Skin: He is not diaphoretic.  Back and hip incision C/D/I with steri-strips in place. No erythema, no drainage; look good. No other skin breakdown seen.   Psychiatric:  Slightly depressed/flat affect    No results found for this or any previous visit (from the past 48 hour(s)). Dg Shoulder Left  Result Date: 10/24/2019 CLINICAL DATA:  Postop for left shoulder arthroscopy 5 days ago. EXAM: LEFT SHOULDER - 2+ VIEW COMPARISON:  10/17/2019 FINDINGS: Mild degenerative changes the owner surface of the acromioclavicular joint. Lower cervical spine fixation. Transverse aortic atherosclerosis. Given limitations of patient positioning and support apparatus, no fracture or dislocation. Possible osseous irregularity involving the inferior glenoid, suboptimally evaluated. IMPRESSION: No acute osseous abnormality. Electronically Signed   By: Jeronimo GreavesKyle  Talbot M.D.   On: 10/24/2019 14:19       Medical Problem List and Plan: 1.  Impaired mobility/ADLs/Function secondary to Cauda equina syndrome, L shoulder dislocation s/p repair (very unstable L shoulder) NWB x 6 weeks, and L trimalleolar fx s/p ORIF NWB 6+ weeks with neurogenic bowel and  bladder.  2.  Antithrombotics: -DVT/anticoagulation:  Pharmaceutical: Lovenox  -antiplatelet therapy: N/A 3. Pain Management: On tylenol 1000 mg qid, ultram 25 mg qid, robaxin 500 mg qid, Celebrex bid and Oxycodone prn. Will start Gabapentin for nerve pain 4. Mood: LCSW to follow for evaluation and support   -antipsychotic agents: N/A 5. Neuropsych: This patient is capable of making decisions on his own behalf. 6. Skin/Wound Care: Routine pressure relief measures.  7. Fluids/Electrolytes/Nutrition: Monitor I/O. Check lytes in am.  8.  Left shoulder dislocation s/p arthroscopy: Sling with NWB X 5 more weeks. 9. ORIF Left trimalleolar ankle fracture: NWB LLE 10 ABLA: Continue to monitor. Recheck in am.  11. Neurogenic bowel:  Will check KUB.  Discontinue Celebrex as reports of abdominal pain--has lot of neuropathic pain. Surgery to start gabapentin.  Change Senna S to plain senna. Continue miralax. Add Anusol HC cream for hems.  Will start Bowel program once gets to inpt rehab in evening- with dig stimulation- went over with pt and wife need for dig stim to full evacuate bowels. 12. Neurogenic bladder: Continue foley for now--voiding trial once bowel program established. Will not be able to self cath with LUE out of commission.  Check UA/UCS. Once gets to rehab, will start Flomax, and then get foley out, if possible, to see if gets return- if not, then will decide at that time (due to LUE NWB) if he wants foley or in/ou cathing.  Will also d/c oxybutynin since not having bladder spasms. 13. Steroid induced hyperglycemia:  Question timing of weaning.   I have personally performed a face to face diagnostic evaluation of this patient and formulated the key components of the plan.  Additionally, I have personally reviewed laboratory data, imaging studies, as well as relevant notes and concur with the physician assistant's documentation above.   The patient's status has not changed from the original H&P.  Any changes in documentation from the acute care chart have been noted above.       Jacquelynn Cree, PA-C 10/25/2019

## 2019-10-25 NOTE — Progress Notes (Signed)
Occupational Therapy Treatment Patient Details Name: Louis Jordan MRN: [id number] DOB: September 01, 1956 Today's Date: 10/25/2019    History of present illness Admitted after fall from ladder resulting in L1 burst fracture, now s/p lumbar surgery for fixation (clamshell brace, may don sitting), L ankle comminuted fracture s/p ORIF 11/4, and L shoulder dislocation s/p L arthroscopic labral and capsular repair, loose body removal, and debridement 11/4; Lumbar surgery 11/1   OT comments  Pt making progress with functional goals. Pt now has custom fit LSO brace. Limited today due to fatigue and pain. Pt declined transfers, just back to bed recently from being up in recliner after PT approximately 1.5 hours. Per PT note, pt mod A +2/max A sit - stand and min/min guard A with lateral scoot transfers. Pt able to sit EOB for UB ADLs and grooming tasks with min A. L UE in abduction sling. Reviewed sling education with handout provided with pt and his wife. OT will continue to follow acutely  Follow Up Recommendations  CIR    Equipment Recommendations  3 in 1 bedside commode;Tub/shower seat;Other (comment)(ADL A/E kit)    Recommendations for Other Services      Precautions / Restrictions Precautions Precautions: Fall;Back Precaution Comments: L shoulder dislocation - maintain sling; NWB L UE, provided back handout, initiated sling edcuation handout Required Braces or Orthoses: Sling;Spinal Brace Spinal Brace: Applied in sitting position Spinal Brace Comments: awaiting custom LSO brace per MD note Restrictions Weight Bearing Restrictions: Yes LUE Weight Bearing: Non weight bearing LLE Weight Bearing: Non weight bearing       Mobility Bed Mobility Overal bed mobility: Needs Assistance Bed Mobility: Rolling;Sidelying to Sit;Sit to Sidelying Rolling: Min assist;+2 for safety/equipment Sidelying to sit: Mod assist     Sit to sidelying: Mod assist    Transfers                 General  transfer comment: declined, just back to bed recently from being up in recliner after PT approximately 1.5 hours. Per PT note, pt mod A +2/max A sit - stand and min/min guard A with lateral scoot transfers    Balance Overall balance assessment: Needs assistance Sitting-balance support: Feet supported;Single extremity supported Sitting balance-Leahy Scale: Fair                                     ADL either performed or assessed with clinical judgement   ADL Overall ADL's : Needs assistance/impaired     Grooming: Wash/dry hands;Wash/dry face;Min guard;Sitting   Upper Body Bathing: Min guard;Sitting   Lower Body Bathing: Moderate assistance;Sitting/lateral leans   Upper Body Dressing : Min guard;Cueing for safety   Lower Body Dressing: Total assistance     Toilet Transfer Details (indicate cue type and reason): declined, just back to bed recently from being up in recliner after PT approximately 1.5 hours. Per PT note, pt mod A +2/max A sit - stand and min/min guard A with lateral scoot transfers                 Vision Patient Visual Report: No change from baseline     Perception     Praxis      Cognition Arousal/Alertness: Awake/alert Behavior During Therapy: WFL for tasks assessed/performed Overall Cognitive Status: Within Functional Limits for tasks assessed  Exercises     Shoulder Instructions       General Comments      Pertinent Vitals/ Pain       Pain Assessment: 0-10 Pain Score: 6  Pain Location: back Pain Descriptors / Indicators: Aching;Sore Pain Intervention(s): Limited activity within patient's tolerance;Monitored during session;Premedicated before session;Repositioned  Home Living                                          Prior Functioning/Environment              Frequency  Min 2X/week        Progress Toward Goals  OT Goals(current goals  can now be found in the care plan section)  Progress towards OT goals: Progressing toward goals     Plan Discharge plan remains appropriate    Co-evaluation                 AM-PAC OT "6 Clicks" Daily Activity     Outcome Measure   Help from another person eating meals?: None Help from another person taking care of personal grooming?: A Little Help from another person toileting, which includes using toliet, bedpan, or urinal?: Total Help from another person bathing (including washing, rinsing, drying)?: A Lot Help from another person to put on and taking off regular upper body clothing?: A Little Help from another person to put on and taking off regular lower body clothing?: Total 6 Click Score: 14    End of Session Equipment Utilized During Treatment: Back brace  OT Visit Diagnosis: Other abnormalities of gait and mobility (R26.89);Muscle weakness (generalized) (M62.81);History of falling (Z91.81);Pain Pain - Right/Left: Left Pain - part of body: Leg;Ankle and joints of foot;Shoulder(back)   Activity Tolerance Patient limited by pain;Patient limited by fatigue   Patient Left in bed;with call bell/phone within reach;with family/visitor present   Nurse Communication          Time: 0722-5750 OT Time Calculation (min): 19 min  Charges: OT Treatments $Therapeutic Activity: 8-22 mins     Britt Bottom 10/25/2019, 3:03 PM

## 2019-10-25 NOTE — Progress Notes (Signed)
Inpatient Rehabilitation-Admissions Coordinator   I have received insurance approval and medical clearance from attending MD for admit to CIR. AC will update RN, and TOC team regarding plan for admit to CIR today. I have reviewed insurance letter and consent forms. Pt and his wife in agreement for CIR today.   Louis Jordan, OTR/L  Rehab Admissions Coordinator  [phone] 10/25/2019 5:00 PM

## 2019-10-26 ENCOUNTER — Inpatient Hospital Stay (HOSPITAL_COMMUNITY): Payer: Commercial Managed Care - PPO | Admitting: Physical Therapy

## 2019-10-26 ENCOUNTER — Inpatient Hospital Stay (HOSPITAL_COMMUNITY): Payer: Commercial Managed Care - PPO

## 2019-10-26 DIAGNOSIS — K592 Neurogenic bowel, not elsewhere classified: Secondary | ICD-10-CM | POA: Diagnosis present

## 2019-10-26 DIAGNOSIS — G834 Cauda equina syndrome: Secondary | ICD-10-CM

## 2019-10-26 DIAGNOSIS — N319 Neuromuscular dysfunction of bladder, unspecified: Secondary | ICD-10-CM | POA: Diagnosis present

## 2019-10-26 LAB — COMPREHENSIVE METABOLIC PANEL
ALT: 37 U/L (ref 0–44)
AST: 21 U/L (ref 15–41)
Albumin: 3 g/dL — ABNORMAL LOW (ref 3.5–5.0)
Alkaline Phosphatase: 53 U/L (ref 38–126)
Anion gap: 9 (ref 5–15)
BUN: 23 mg/dL (ref 8–23)
CO2: 28 mmol/L (ref 22–32)
Calcium: 8.6 mg/dL — ABNORMAL LOW (ref 8.9–10.3)
Chloride: 98 mmol/L (ref 98–111)
Creatinine, Ser: 0.84 mg/dL (ref 0.61–1.24)
GFR calc Af Amer: >60 mL/min (ref >60)
GFR calc non Af Amer: >60 mL/min (ref >60)
Glucose, Bld: 114 mg/dL — ABNORMAL HIGH (ref 70–99)
Potassium: 4.3 mmol/L (ref 3.5–5.1)
Sodium: 135 mmol/L (ref 135–145)
Total Bilirubin: 0.9 mg/dL (ref 0.3–1.2)
Total Protein: 5.2 g/dL — ABNORMAL LOW (ref 6.5–8.1)

## 2019-10-26 LAB — CBC WITH DIFFERENTIAL/PLATELET
Abs Immature Granulocytes: 0.09 10*3/uL — ABNORMAL HIGH (ref 0.00–0.07)
Basophils Absolute: 0 10*3/uL (ref 0.0–0.1)
Basophils Relative: 0 %
Eosinophils Absolute: 0 10*3/uL (ref 0.0–0.5)
Eosinophils Relative: 0 %
HCT: 35.3 % — ABNORMAL LOW (ref 39.0–52.0)
Hemoglobin: 12 g/dL — ABNORMAL LOW (ref 13.0–17.0)
Immature Granulocytes: 1 %
Lymphocytes Relative: 8 %
Lymphs Abs: 0.8 10*3/uL (ref 0.7–4.0)
MCH: 31.9 pg (ref 26.0–34.0)
MCHC: 34 g/dL (ref 30.0–36.0)
MCV: 93.9 fL (ref 80.0–100.0)
Monocytes Absolute: 0.7 10*3/uL (ref 0.1–1.0)
Monocytes Relative: 7 %
Neutro Abs: 8.9 10*3/uL — ABNORMAL HIGH (ref 1.7–7.7)
Neutrophils Relative %: 84 %
Platelets: 433 10*3/uL — ABNORMAL HIGH (ref 150–400)
RBC: 3.76 MIL/uL — ABNORMAL LOW (ref 4.22–5.81)
RDW: 14.4 % (ref 11.5–15.5)
WBC: 10.6 10*3/uL — ABNORMAL HIGH (ref 4.0–10.5)
nRBC: 0 % (ref 0.0–0.2)

## 2019-10-26 LAB — URINALYSIS, ROUTINE W REFLEX MICROSCOPIC
Bacteria, UA: NONE SEEN
Bilirubin Urine: NEGATIVE
Glucose, UA: NEGATIVE mg/dL
Ketones, ur: NEGATIVE mg/dL
Nitrite: NEGATIVE
Protein, ur: NEGATIVE mg/dL
Specific Gravity, Urine: 1.019 (ref 1.005–1.030)
pH: 6 (ref 5.0–8.0)

## 2019-10-26 MED ORDER — TAMSULOSIN HCL 0.4 MG PO CAPS
0.4000 mg | ORAL_CAPSULE | Freq: Every day | ORAL | Status: DC
  Administered 2019-10-26 – 2019-10-28 (×3): 0.4 mg via ORAL
  Filled 2019-10-26 (×3): qty 1

## 2019-10-26 NOTE — H&P (Signed)
Physical Medicine and Rehabilitation Admission H&P       Chief Complaint  Patient presents with  . Fall with L1 burst fracture and ankle fracture    HPI: Louis Jordan is a 63 year old R handed male with who fell 12 feet off a ladder on 10/15/19 with subsequent L1 burst fracture with retropulsion, left anterior shoulder dislocation and left trimalleolar ankle fracture. Shoulder reduced by trauma with sling recommended and left ankle splinted. He was taken to OR for ORIF L1 fracture with decompressive laminectomy with removal of epidural hematoma, medial facetectomy and foraminotomy with posterior fixation T10- L4 and intertransverse arthrodesis of L4 by Dr. Yetta Barre.   Noted to have perineal numbness due to conus injury with neurogenic bowel and bladder. He did have repeat dislocation of left shoulder requiring reduction 11/2 and Dr. Everardo Pacific consulted who felt that patient with Bankart injury --arthroscopic stabilization recommended. He was taken to OR 11/4 for arthroscopy with removal of loose bodies and labral repair and capsulorrhaphy as well as ORIF left trimalleolar ankle fracture by Dr. Jena Gauss.  Post op NWB LLE and  NWB LUE for  6 weeks with gentle stretching of elbow due to highly unstable shoulder.     Custom LSO ordered for back support as sling inhibits TLSO use. Foley in place due to urinary retention. He continues on decadron 4 mg qid with elevated fasting BS. He continues to have saddle anesthesia and received SMOG enema 11/09 and on dulcolax suppositories bid.  He reports saddle anesthesia with abdominal pain, LLE > back pain that is constant. Is tolerating increase in activity but does get nauseous with fatigue. 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 CIR recommended due to functional decline.    Pt had 1 small-medium BM in last 9+ days- feels abdominal pain- they thought he was having bladder spasms earlier this week and started Oxybutynin- sounds  like gas/constipation pain, not bladder spasms, although hard to be sure. Pt could benefit from removing foley after admission and starting Flomax to see if can get him voiding. Will need bowel program.   Also started gabapentin for nerve pain he's having.   Review of Systems  Constitutional: Negative for chills and fever.  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 Negative for hearing loss.   Eyes: Negative for blurred vision and double vision.  Respiratory: Negative for cough and shortness of breath.   Cardiovascular: Negative for chest pain, palpitations and leg swelling.  Gastrointestinal: Positive for abdominal pain, constipation and nausea (when up with therapy). Negative for heartburn and vomiting.  Musculoskeletal: Positive for back pain and myalgias.       Pain constant at 7- 8 leg > back.   Skin: Negative for itching and rash.  Neurological: Positive for dizziness (transient), sensory change (doesn't feel anything between his legs. Decreased sensation bilateral buttocks. ) and weakness.  Psychiatric/Behavioral: The patient is nervous/anxious and has insomnia (pain worsens when meds run out of system. ).   All other systems reviewed and are negative.        Past Medical History:  Diagnosis Date  . Bundle branch block, right   . Complication of anesthesia   . Hand pain    mild OA bilateral  hands  . Left knee injury 1970   requiring surgery for repair of ligaments/cartilage damage with nerve injury that took 20 years to resolve  . MVP (mitral valve prolapse)   .  PONV (postoperative nausea and vomiting)          Past Surgical History:  Procedure Laterality Date  . APPENDECTOMY    . KNEE SURGERY Right   . LUMBAR SPINE SURGERY  10/16/2019   L1 burst fracture with epidural hematoma and canal stenosis  . OPEN REDUCTION INTERNAL FIXATION (ORIF) TIBIA/FIBULA FRACTURE Left 10/19/2019   Procedure: OPEN REDUCTION INTERNAL FIXATION  (ORIF) TIBIA/FIBULA FRACTURE;  Surgeon: Roby LoftsHaddix, Kevin P, MD;  Location: [facility] OR;  Service: Orthopedics;  Laterality: Left;  . SHOULDER ARTHROSCOPY WITH BANKART REPAIR Left 10/19/2019   Procedure: SHOULDER ARTHROSCOPY WITH BANKART REPAIR, EXTENSIVE DEBRIDEMENT, LOOSE BODY REMOVAL;  Surgeon: Bjorn PippinVarkey, Dax T, MD;  Location: [facility] OR;  Service: Orthopedics;  Laterality: Left;  . STRABISMUS SURGERY    . TONSILLECTOMY           Family History  Problem Relation Age of Onset  . Heart disease Mother   . Alzheimer's disease Mother   . Pancreatic cancer Father   . High blood pressure Brother      Social History:  Married. 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 active and worked out 5 days a week. He reports that he has never smoked. He has never used smokeless tobacco. He has not used alcohol in some time--wine on rare occasions.  He reports that he does not use drugs. Lives in 2 story house with 1-3 STE depending on entrance; can live on 1st floor.   Allergies  Allergen Reactions  . Penicillins Swelling          Medications Prior to Admission  Medication Sig Dispense Refill  . ibuprofen (ADVIL) 200 MG tablet Take 400 mg by mouth every 6 (six) hours as needed for headache.    . Multiple Vitamins-Minerals (MULTIVITAMIN ADULT PO) Take 1 tablet by mouth daily.    . Naproxen Sod-diphenhydrAMINE (ALEVE PM PO) Take 2 tablets by mouth as needed (sleep).      Drug Regimen Review  Drug regimen was reviewed and remains appropriate with no significant issues identified  Home: Home Living Family/patient expects to be discharged to:: Private residence Living Arrangements: Spouse/significant other Available Help at Discharge: Family, Available 24 hours/day Type of Home: House Home Access: Stairs to enter Home Layout: One level Bathroom Shower/Tub: Health visitorWalk-in shower Bathroom Toilet: Standard Home Equipment: None   Functional History: Prior Function Level of  Independence: Independent  Functional Status:  Mobility: Bed Mobility Overal bed mobility: Needs Assistance Bed Mobility: Rolling, Sidelying to Sit Rolling: Min assist, +2 for safety/equipment Sidelying to sit: Mod assist Supine to sit: Mod assist, +2 for physical assistance, +2 for safety/equipment Sit to supine: Mod assist, +2 for physical assistance, +2 for safety/equipment General bed mobility comments: cues for sequencing and assistance required to bring bilat LE/hips to EOB and to elevate trunk into sitting; light mod, almost min assist to push up to sit -- good push with R UE Transfers Overall transfer level: Needs assistance Transfers: Lateral/Scoot Transfers, Sit to/from Stand Sit to Stand: Mod assist, +2 physical assistance, +2 safety/equipment, From elevated surface, Max assist  Lateral/Scoot Transfers: Min assist, Min guard General transfer comment: Performed lateral scoot transfer bed>droparm BSC>recliner; verbal and demo cues for technique an doccasional reminders to keep weight off of LLE; good boost, and use of RLE to unweigh hips for scooting; Then worked on single limb standing and activity tolerance; Stood from recliner directly in front of bed (in lowest position), and propped L knee on bed; cues  to fully extend R knee in standing and fully extend hips  ADL: ADL Overall ADL's : Needs assistance/impaired Eating/Feeding: Independent, Sitting, Bed level Grooming: Wash/dry hands, Wash/dry face, Min guard, Sitting Upper Body Bathing: Min guard, Sitting Lower Body Bathing: Maximal assistance Upper Body Dressing : Min guard, Cueing for safety Lower Body Dressing: Total assistance Toilet Transfer Details (indicate cue type and reason): not attempted Toileting- Clothing Manipulation and Hygiene: Total assistance, Bed level General ADL Comments: limited to fear within the session but complete sit to stands with +2 and varrying level of assist from raised surface    Cognition: Cognition Overall Cognitive Status: Within Functional Limits for tasks assessed Orientation Level: Oriented X4 Cognition Arousal/Alertness: Awake/alert Behavior During Therapy: WFL for tasks assessed/performed Overall Cognitive Status: Within Functional Limits for tasks assessed General Comments: anxious    Blood pressure (!) 164/91, pulse 60, temperature 98.2 F (36.8 C), temperature source Oral, resp. rate 18, height 6' (1.829 m), weight 70.3 kg, SpO2 99 %. Physical Exam  Nursing note and vitals reviewed. Constitutional: He is oriented to person, place, and time. He appears well-developed and well-nourished. No distress.  Fatigued appearing. Lying supine in bed; likes bed flat; wife at bedside, NAD  HENT:  Head: Normocephalic and atraumatic.  CN's intact by exam- facial sensation intact; no asymmetry; tongue midline  Eyes: Pupils are equal, round, and reactive to light. EOM are normal. Right eye exhibits no discharge. Left eye exhibits no discharge. No scleral icterus.  Neck: Normal range of motion. Neck supple.  Cardiovascular:  RRR- no M/R/G  Respiratory: No stridor.  CTA B/L- no W/R/R  GI: Soft. He exhibits no distension. There is abdominal tenderness.  Very hypoactive BS; TTP diffusely; mild distension; slightly firmer due to severe constipation  Genitourinary:    Genitourinary Comments: Foley in place. External hemorrhoids noted. .    Musculoskeletal:     Comments: Left shoulder incision C/D/I. Splint in place left ankle.   RUE biceps, triceps, WE, grip and finger bad 5/5 LUE- can only test grip/finger abd which were 5/5 RLE- HF 5-/5, KE 5-/5, DF 5-/5, PF 5-/5, EHL 4/5 LLE- HF 5-/5, KE 5-/5- EHL 4/5; couldn't test at ankle due to fx/cast   Neurological: He is alert and oriented to person, place, and time.  Intact sensation C1-T12 B/L RLE- Decreased sensation at L1 on R; L2/L3 nml, L4- S2 decreased on RLE LLE- L1, L2, L3 nml sensation; cannot test L4/L5  due to cast; S1/S2 decreased on LLE Buttocks- S3 decreased, S4 really decreased and S5 absent B/L    Skin: He is not diaphoretic.  Back and hip incision C/D/I with steri-strips in place. No erythema, no drainage; look good. No other skin breakdown seen.   Psychiatric:  Slightly depressed/flat affect    Lab Results Last 48 Hours  No results found for this or any previous visit (from the past 48 hour(s)).    Imaging Results (Last 48 hours)  Dg Shoulder Left  Result Date: 10/24/2019 CLINICAL DATA:  Postop for left shoulder arthroscopy 5 days ago. EXAM: LEFT SHOULDER - 2+ VIEW COMPARISON:  10/17/2019 FINDINGS: Mild degenerative changes the owner surface of the acromioclavicular joint. Lower cervical spine fixation. Transverse aortic atherosclerosis. Given limitations of patient positioning and support apparatus, no fracture or dislocation. Possible osseous irregularity involving the inferior glenoid, suboptimally evaluated. IMPRESSION: No acute osseous abnormality. Electronically Signed   By: Abigail Miyamoto M.D.   On: 10/24/2019 14:19        Medical Problem List  and Plan: 1.  Impaired mobility/ADLs/Function secondary to Cauda equina syndrome, L shoulder dislocation s/p repair (very unstable L shoulder) NWB x 6 weeks, and L trimalleolar fx s/p ORIF NWB 6+ weeks with neurogenic bowel and bladder.  2.  Antithrombotics: -DVT/anticoagulation:  Pharmaceutical: Lovenox             -antiplatelet therapy: N/A 3. Pain Management: On tylenol 1000 mg qid, ultram 25 mg qid, robaxin 500 mg qid, Celebrex bid and Oxycodone prn. Will start Gabapentin for nerve pain 4. Mood: LCSW to follow for evaluation and support              -antipsychotic agents: N/A 5. Neuropsych: This patient is capable of making decisions on his own behalf. 6. Skin/Wound Care: Routine pressure relief measures.  7. Fluids/Electrolytes/Nutrition: Monitor I/O. Check lytes in am.  8.  Left shoulder dislocation s/p arthroscopy:  Sling with NWB X 5 more weeks. 9. ORIF Left trimalleolar ankle fracture: NWB LLE 10 ABLA: Continue to monitor. Recheck in am.  11. Neurogenic bowel:  Will check KUB. Discontinue Celebrex as reports of abdominal pain--has lot of neuropathic pain. Surgery to start gabapentin.  Change Senna S to plain senna. Continue miralax. Add Anusol HC cream for hems.  Will start Bowel program once gets to inpt rehab in evening- with dig stimulation- went over with pt and wife need for dig stim to full evacuate bowels. 12. Neurogenic bladder: Continue foley for now--voiding trial once bowel program established. Will not be able to self cath with LUE out of commission.  Check UA/UCS. Once gets to rehab, will start Flomax, and then get foley out, if possible, to see if gets return- if not, then will decide at that time (due to LUE NWB) if he wants foley or in/ou cathing.  Will also d/c oxybutynin since not having bladder spasms. 13. Steroid induced hyperglycemia:  Question timing of weaning.   I have personally performed a face to face diagnostic evaluation of this patient and formulated the key components of the plan. Additionally, I have personally reviewed laboratory data, imaging studies, as well as relevant notes and concur with the physician assistant's documentation above.  The patient's status has not changed from the original H&P. Any changes in documentation from the acute care chart have been noted above.      Jacquelynn Cree, PA-C 10/25/2019

## 2019-10-26 NOTE — Progress Notes (Signed)
Louis Heys, MD  Physician  Physical Medicine and Rehabilitation  PMR Pre-admission  Signed  Date of Service:  10/21/2019 3:23 PM      Related encounter: ED to Hosp-Admission (Discharged) from 10/15/2019 in [facility]         PMR Admission Coordinator Pre-Admission Assessment  Patient: Louis Jordan is an 63 y.o., male MRN: [id number] DOB: 1955-12-25 Height: 6' (182.9 cm) Weight: 70.3 kg  Insurance Information HMO:     PPO: yes     PCP:      IPA:      80/20:      OTHER: PRIMARY: [employer]      Policy#: [id number]      Subscriber: Patient CM Name: Louis Jordan      Phone#: [phone]     Fax#: [phone] Pre-Cert#: [id number]      Employer:  [employer] provided by Louis Jordan on 11/10 for admit to CIR on 11/10. Pt is approved for 7 days with updates due 11/17. Follow up CM is Louis Jordan (P); [phone] (f): [phone] Benefits:  Phone #: online - [email or web address]     Name: [email or web address] Eff. Date: 12/15/2018 - 12/15/2019 (revised on 04/15/2019)     Deduct: $1,000 ($174.36 met)      Out of Pocket Max: $3,000 ($174.36 met - includes deductible)      Life Max: NA CIR: coverage is 80%, 20% co-insurance      SNF: 80% coverage, 20% co-oinsurnace; limited by medical necessity review and requires continual improvement Outpatient: 80% coverage, 20% co-insurance; limited to 20 visits combined with OT/PT/Manipulation therapy/and aquatic for office or out pt. Hopsital, limited to 20 visits ST (accumulation separate from other therapies).     Home Health: 80% coverage, 20% co-insurance; limited by medical necessity review with 1 visit = 4 hours     DME: 80% coverage     Co-Pay: 20% co-insurance Providers:  SECONDARY: None      Policy#:       Subscriber:  CM Name:       Phone#:      Fax#:  Pre-Cert#:       Employer:  Benefits:  Phone #:      Name:  Eff. Date:      Deduct:      Out of Pocket Max:       Life Max:  CIR:       SNF:  Outpatient:      Co-Pay:  Home Health:        Co-Pay:  DME:      Co-Pay:  Medicaid Application Date:       Case Manager:  Disability Application Date:       Case Worker:   The "Data Collection Information Summary" for patients in Inpatient Rehabilitation Facilities with attached "Privacy Act [place] Records" was provided and verbally reviewed with: N/A  Emergency Contact Information         Contact Information    Name Relation Home Work Mobile   Jordan,Louis Spouse [phone]  [phone]      Current Medical History  Patient Admitting Diagnosis: multi-ortho trauma  History of Present Illness: Louis Jordan is a 63 year old male with who fell 12 feet off a ladder on 10/15/19 with subsequent L1 burst fracture with retropulsion, left anterior shoulder dislocation and left trimalleolar ankle fracture. Shoulder reduced by trauma with sling recommended and left ankle splinted. He was taken to OR for ORIF L1 fracture with decompressive  laminectomy with removal of epidural hematoma, medial facetectomy and foraminotomy with posterior fixation T10- L4 and intertransverse arthrodesis of L4 by Dr. Ronnald Ramp. Noted to have perineal numbness due to conus injury with neurogenic bowel and bladder. He did have repeat dislocation of left shoulder requiring reduction 11/2 and Dr. Griffin Basil consulted who felt that patient with Bankart injury --arthroscopic stabilization recommended. He was taken to OR 11/4 for arthroscopy with removal of loose bodies and labral repair and capsulorrhaphy as well as ORIF left trimalleolar ankle fracture by Dr. Doreatha Martin. Post op NWB LLE and NWB LUE for 6 weeks with gentle stretching of elbow due to highly unstable shoulder.   Custom LSO ordered for back support as sling inhibits TLSO use. Foley in place due to urinary retention. He continues on decadron 4 mg qid with elevated fasting BS. He continues to have saddle anesthesia and received SMOG enema 11/09 and on dulcolax suppositories bid.He reports saddle  anesthesia with abdominal pain, LLE > back pain that is constant. Is tolerating increase in activity but does get nauseous with fatigue. 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 CIR recommended due to functional decline.Pt is to admit to CIR 10/25/2019.   Patient's medical record from [facility] has been reviewed by the rehabilitation admission coordinator and physician.  Past Medical History      Past Medical History:  Diagnosis Date  . Bundle branch block, right   . Complication of anesthesia   . Hand pain    mild OA bilateral  hands  . Left knee injury 1970   requiring surgery for repair of ligaments/cartilage damage with nerve injury that took 20 years to resolve  . MVP (mitral valve prolapse)   . PONV (postoperative nausea and vomiting)     Family History   family history includes Alzheimer's disease in his mother; Heart disease in his mother; High blood pressure in his brother; Pancreatic cancer in his father.  Prior Rehab/Hospitalizations Has the patient had prior rehab or hospitalizations prior to admission? No  Has the patient had major surgery during 100 days prior to admission? Yes             Current Medications  Current Facility-Administered Medications:  .  acetaminophen (TYLENOL) tablet 1,000 mg, 1,000 mg, Oral, Q6H, Saverio Danker, PA-C, 1,000 mg at 10/25/19 0819 .  bisacodyl (DULCOLAX) suppository 10 mg, 10 mg, Rectal, QID, Saverio Danker, PA-C, 10 mg at 10/24/19 2140 .  celecoxib (CELEBREX) capsule 200 mg, 200 mg, Oral, Q12H, Patrecia Pace A, PA-C, 200 mg at 10/25/19 1043 .  Chlorhexidine Gluconate Cloth 2 % PADS 6 each, 6 each, Topical, Daily, Delray Alt, PA-C, 6 each at 10/25/19 1050 .  dexamethasone (DECADRON) injection 4 mg, 4 mg, Intravenous, Q6H, 4 mg at 10/22/19 1807 **OR** dexamethasone (DECADRON) tablet 4 mg, 4 mg, Oral, Q6H, Yacobi, Sarah A, PA-C, 4 mg at 10/25/19 1142 .  docusate  sodium (COLACE) capsule 100 mg, 100 mg, Oral, BID, Saverio Danker, PA-C, 100 mg at 10/25/19 1043 .  enoxaparin (LOVENOX) injection 40 mg, 40 mg, Subcutaneous, Q24H, Saverio Danker, PA-C, 40 mg at 10/25/19 1146 .  gabapentin (NEURONTIN) capsule 200 mg, 200 mg, Oral, BID, Saverio Danker, PA-C, 200 mg at 10/25/19 1344 .  methocarbamol (ROBAXIN) tablet 500 mg, 500 mg, Oral, QID, 500 mg at 10/25/19 1343 **OR** [DISCONTINUED] methocarbamol (ROBAXIN) 500 mg in dextrose 5 % 50 mL IVPB, 500 mg, Intravenous, Q6H PRN, Eustace Moore,  MD .  morphine 2 MG/ML injection 1 mg, 1 mg, Intravenous, Q8H PRN, Lovick, Montel Culver, MD .  ondansetron (ZOFRAN) tablet 4 mg, 4 mg, Oral, Q6H PRN **OR** ondansetron (ZOFRAN) injection 4 mg, 4 mg, Intravenous, Q6H PRN, Delray Alt, PA-C, 4 mg at 10/21/19 2107 .  oxybutynin (DITROPAN-XL) 24 hr tablet 5 mg, 5 mg, Oral, QHS, Lovick, Montel Culver, MD, 5 mg at 10/24/19 2317 .  oxyCODONE (Oxy IR/ROXICODONE) immediate release tablet 5-10 mg, 5-10 mg, Oral, Q4H PRN, Patrecia Pace A, PA-C, 10 mg at 10/25/19 1343 .  polyethylene glycol (MIRALAX / GLYCOLAX) packet 17 g, 17 g, Oral, BID, Saverio Danker, PA-C, 17 g at 10/25/19 1045 .  senna (SENOKOT) tablet 8.6 mg, 1 tablet, Oral, BID, Delray Alt, PA-C, 8.6 mg at 10/24/19 2140 .  sodium phosphate (FLEET) 7-19 GM/118ML enema 1 enema, 1 enema, Rectal, Daily PRN, Georganna Skeans, MD, 1 enema at 10/23/19 1548 .  traMADol (ULTRAM) tablet 50 mg, 50 mg, Oral, Q6H, Saverio Danker, PA-C, 50 mg at 10/25/19 1142  Patients Current Diet:     Diet Order                  Diet regular Room service appropriate? Yes; Fluid consistency: Thin  Diet effective now               Precautions / Restrictions Precautions Precautions: Fall, Back Precaution Comments: L shoulder dislocation - maintain sling; NWB L UE, provided back handout, initiated sling edcuation handout Spinal Brace: Applied in sitting position Spinal Brace Comments: awaiting  custom LSO brace per MD note Restrictions Weight Bearing Restrictions: Yes LUE Weight Bearing: Non weight bearing LLE Weight Bearing: Non weight bearing Other Position/Activity Restrictions: awaiting LSO custom back brace to fit with shoulder abduction sling   Has the patient had 2 or more falls or a fall with injury in the past year? Yes  Prior Activity Level Community (5-7x/wk): very active, worked full time in an office job. drove PTA; no AD use PTA  Prior Functional Level Self Care: Did the patient need help bathing, dressing, using the toilet or eating? Independent  Indoor Mobility: Did the patient need assistance with walking from room to room (with or without device)? Independent  Stairs: Did the patient need assistance with internal or external stairs (with or without device)? Independent  Functional Cognition: Did the patient need help planning regular tasks such as shopping or remembering to take medications? Independent  Home Assistive Devices / Equipment Home Equipment: None  Prior Device Use: Indicate devices/aids used by the patient prior to current illness, exacerbation or injury? None of the above  Current Functional Level Cognition  Overall Cognitive Status: Within Functional Limits for tasks assessed Orientation Level: Oriented X4 General Comments: anxious     Extremity Assessment (includes Sensation/Coordination)  Upper Extremity Assessment: LUE deficits/detail LUE Deficits / Details: NWB to UE LUE: Unable to fully assess due to pain, Unable to fully assess due to immobilization LUE Sensation: WNL LUE Coordination: decreased gross motor  Lower Extremity Assessment: Defer to PT evaluation    ADLs  Overall ADL's : Needs assistance/impaired Eating/Feeding: Independent, Sitting, Bed level Grooming: Wash/dry hands, Wash/dry face, Min guard, Sitting Upper Body Bathing: Min guard, Sitting Lower Body Bathing: Moderate assistance, Sitting/lateral  leans Upper Body Dressing : Min guard, Cueing for safety Lower Body Dressing: Total assistance Toilet Transfer Details (indicate cue type and reason): declined, just back to bed recently from being up in recliner after PT approximately  1.5 hours. Per PT note, pt mod A +2/max A sit - stand and min/min guard A with lateral scoot transfers Toileting- Clothing Manipulation and Hygiene: Total assistance, Bed level General ADL Comments: limited to fear within the session but complete sit to stands with +2 and varrying level of assist from raised surface     Mobility  Overal bed mobility: Needs Assistance Bed Mobility: Rolling, Sidelying to Sit, Sit to Sidelying Rolling: Min assist, +2 for safety/equipment Sidelying to sit: Mod assist Supine to sit: Mod assist, +2 for physical assistance, +2 for safety/equipment Sit to supine: Mod assist, +2 for physical assistance, +2 for safety/equipment Sit to sidelying: Mod assist General bed mobility comments: cues for sequencing and assistance required to bring bilat LE/hips to EOB and to elevate trunk into sitting; light mod, almost min assist to push up to sit -- good push with R UE    Transfers  Overall transfer level: Needs assistance Transfers: Lateral/Scoot Transfers, Sit to/from Stand Sit to Stand: Mod assist, +2 physical assistance, +2 safety/equipment, From elevated surface, Max assist  Lateral/Scoot Transfers: Min assist, Min guard General transfer comment: declined, just back to bed recently from being up in recliner after PT approximately 1.5 hours. Per PT note, pt mod A +2/max A sit - stand and min/min guard A with lateral scoot transfers    Ambulation / Gait / Stairs / Wheelchair Mobility       Posture / Balance Dynamic Sitting Balance Sitting balance - Comments: Performed R lean and elbow prop while sitting on BSC (drop-arm down) for pericare; able to push back up with RUE Balance Overall balance assessment: Needs assistance  Sitting-balance support: Feet supported, Single extremity supported Sitting balance-Leahy Scale: Fair Sitting balance - Comments: Performed R lean and elbow prop while sitting on BSC (drop-arm down) for pericare; able to push back up with RUE Postural control: Posterior lean    Special needs/care consideration BiPAP/CPAP : no CPM : no Continuous Drip IV : no Dialysis : no        Days : no Life Vest : no Oxygen : no Special Bed : no Trach Size : no Wound Vac (area) : no      Location : no Skin : surgical incision to the back, ankle (left), shoulder (left).  Bowel mgmt: last charted 10/31-per pt had BM today  Bladder mgmt: catheter Diabetic mgmt: no Behavioral consideration : no Chemo/radiation : no   Previous Home Environment (from acute therapy documentation) Living Arrangements: Spouse/significant other Available Help at Discharge: Family, Available 24 hours/day Type of Home: House Home Layout: One level Home Access: Stairs to enter [employer] Shower/Tub: Multimedia programmer: [place]: No  Discharge Living Setting Plans for Discharge Living Setting: Patient's home, Lives with (comment)(wife) Type of Home at Discharge: House Discharge Home Layout: Two level Alternate Level Stairs-Rails: None(NA, pt does not need to use 2nd stor for any tasks at DC) Alternate Level Stairs-Number of Steps: NA Discharge Home Access: Stairs to enter Entrance Stairs-Rails: None Entrance Stairs-Number of Steps: 1 Discharge Bathroom Shower/Tub: Tub/shower unit, Walk-in shower Discharge Bathroom Toilet: Standard Discharge Bathroom Accessibility: Yes How Accessible: Accessible via walker, Other (comment)(wife is measuring doorways for wc accessibility) Does the patient have any problems obtaining your medications?: No  Social/Family/Support Systems Patient Roles: Spouse(employee) Contact Information: (wife: [name] [phone]) Anticipated Caregiver: wife  Anticipated Caregiver's Contact Information: see above Ability/Limitations of Caregiver: Min A Caregiver Availability: 24/7 Discharge Plan Discussed with Primary Caregiver: Yes Is Caregiver In  Agreement with Plan?: Yes Does Caregiver/Family have Issues with Lodging/Transportation while Pt is in Rehab?: No  Goals/Additional Needs Patient/Family Goal for Rehab: PT: Supervision; OT: Min A; SLP: NA Expected length of stay: 14-18 days  Cultural Considerations: NA Dietary Needs: regular diet, thin liquids Equipment Needs: TBD Pt/Family Agrees to Admission and willing to participate: Yes Program Orientation Provided & Reviewed with Pt/Caregiver Including Roles  & Responsibilities: Yes(wife)  Barriers to Discharge: Home environment access/layout, Weight bearing restrictions  Barriers to Discharge Comments: one ledge to enter home environment, unsure if wc accessable environment throughout home.   Decrease burden of Care through IP rehab admission: NA  Possible need for SNF placement upon discharge: Not anticipated. Pt has great social assist at DC from his wife and has grown children nearby who can assist as needed. For the most part, pt's home environment can be modified for greater assessability. Anticipate pt can make large gains in a reasonable amount of time to return home with light assist at DC.   Patient Condition: I have reviewed medical records from [facility], spoken with MD, and patient and spouse. I met with patient at the bedside for inpatient rehabilitation assessment.  Patient will benefit from ongoing PT and OT, can actively participate in 3 hours of therapy a day 5 days of the week, and can make measurable gains during the admission.  Patient will also benefit from the coordinated team approach during an Inpatient Acute Rehabilitation admission.  The patient will receive intensive therapy as well as Rehabilitation physician, nursing, social worker, and care  management interventions.  Due to bladder management, bowel management, safety, skin/wound care, disease management, medication administration, pain management and patient education the patient requires 24 hour a day rehabilitation nursing.  The patient is currently Mod A +2 sit to stand, Min A/Min G for lateral scoot transfers and Min G to Total A for basic ADLs.  Discharge setting and therapy post discharge at home with home health is anticipated.  Patient has agreed to participate in the Acute Inpatient Rehabilitation Program and will admit 10/25/2019.  Preadmission Screen Completed By:  Raechel Ache, 10/25/2019 4:58 PM ______________________________________________________________________   Discussed status with Dr. Dagoberto Ligas on 10/25/2019 at 4:50PM and received approval for admission today.  Admission Coordinator:  Raechel Ache, OT, time 4:58PM/Date 10/25/2019   Assessment/Plan: Diagnosis: 1. Does the need for close, 24 hr/day Medical supervision in concert with the patient's rehab needs make it unreasonable for this patient to be served in a less intensive setting? Yes 2. Co-Morbidities requiring supervision/potential complications: L shoulder labral tear, L trimalleolar ankle fracture, Cauda equina syndrome, neurogenic bowel and bladder, post op pain 3. Due to bladder management, bowel management, skin/wound care and medication administration, does the patient require 24 hr/day rehab nursing? Yes 4. Does the patient require coordinated care of a physician, rehab nurse, PT, OT, and SLP to address physical and functional deficits in the context of the above medical diagnosis(es)? Yes Addressing deficits in the following areas: balance, locomotion, strength, transferring, bowel/bladder control, bathing, dressing, toileting and psychosocial support 5. Can the patient actively participate in an intensive therapy program of at least 3 hrs of therapy 5 days a week? Yes 6. The potential for patient to  make measurable gains while on inpatient rehab is good 7. Anticipated functional outcomes upon discharge from inpatient rehab: supervision PT, supervision OT, n/a SLP 8. Estimated rehab length of stay to reach the above functional goals is: 14-18 days 9. Anticipated discharge destination: Home  10. Overall Rehab/Functional Prognosis: good   MD Signature:         Revision History Date/Time User Provider Type Action  10/25/2019 5:29 PM Louis Heys, MD Physician Sign  10/25/2019 4:58 PM Raechel Ache, [facility] Admission Coordinator Share  View Details Report

## 2019-10-26 NOTE — Progress Notes (Signed)
Urine for routine and culture collected and sent to lab

## 2019-10-26 NOTE — Progress Notes (Signed)
[facility] PHYSICAL MEDICINE & REHABILITATION PROGRESS NOTE   Subjective/Complaints: NO more BMs since yesterday- slept OK- woke up with pain at 10 but pain meds worked pretty fast.  KUB showed no significant constipation- from staff, was told pt had bowel accident when transferred to bed sometime yesterday.  Foley still in- will con't for 1-2 days just to give flomax chance to start to work.      Objective:   Dg Abd 1 View  Result Date: 10/25/2019 CLINICAL DATA:  Constipation for several days EXAM: ABDOMEN - 1 VIEW COMPARISON:  10/15/2019 FINDINGS: Scattered large and small bowel gas is noted. Mild retained fecal material is seen without significant constipation. Postsurgical changes are noted in the thoracolumbar spine. Compression deformity at L1 is noted similar to that seen on prior CT examination. No free air is noted. IMPRESSION: Postsurgical changes are seen. Mild retained fecal material is noted without significant constipation. Electronically Signed   By: Alcide Clever M.D.   On: 10/25/2019 22:36   Recent Labs    10/26/19 0456  WBC 10.6*  HGB 12.0*  HCT 35.3*  PLT 433*   Recent Labs    10/26/19 0456  NA 135  K 4.3  CL 98  CO2 28  GLUCOSE 114*  BUN 23  CREATININE 0.84  CALCIUM 8.6*    Intake/Output Summary (Last 24 hours) at 10/26/2019 1916 Last data filed at 10/26/2019 1706 Gross per 24 hour  Intake 920 ml  Output 910 ml  Net 10 ml     Physical Exam: Vital Signs Blood pressure (!) 162/88, pulse (!) 56, temperature 98 F (36.7 C), resp. rate 18, height 6' (1.829 m), weight 66.2 kg, SpO2 96 %. Physical Exam Nursing noteand labs and vitalsreviewed. Constitutional:  Fatigued appearing. Lying supine in bed appropriate, NAD HENT:  Head:Normocephalicand atraumatic. Eyes:Pupils are equal, round, and reactive to light.EOMare normal. Neck:Normal range of motion.Neck supple.  Cardiovascular: RRR- no M/R/G Respiratory: Nostridor. CTA B/L-  no W/R/R ZT:IWPY. He exhibitsno distension. There isabdominal tenderness. Less distended; more soft, still hypoactive BS Genitourinary: Genitourinary Comments: Foley in place. External hemorrhoids noted. . Musculoskeletal:  Comments: Left shoulder incision C/D/I. Splint in place left ankle.   RUE biceps, triceps, WE, grip and finger bad 5/5 LUE- can only test grip/finger abd which were 5/5 RLE- HF 5-/5, KE 5-/5, DF 5-/5, PF 5-/5, EHL 4/5 LLE- HF 5-/5, KE 5-/5- EHL 4/5; couldn't test at ankle due to fx/cast Neurological: He is alertand oriented to person, place, and time. Intact sensation C1-T12 B/L RLE- Decreased sensation at L1 on R; L2/L3 nml, L4- S2 decreased on RLE LLE- L1, L2, L3 nml sensation; cannot test L4/L5 due to cast; S1/S2 decreased on LLE Buttocks- S3 decreased, S4 really decreased and S5 absent B/L Skin: He isnot diaphoretic. Back and hip incision C/D/I with steri-strips in place. No erythema, no drainage; look good. No other skin breakdown seen. Psychiatric:  Slightly depressed/flat affect, but polite/cordial     Assessment/Plan: 1. 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  Physiatrist is providing close team supervision and 24 hour management of active medical problems listed below.  Physiatrist and rehab team continue to assess barriers to discharge/monitor patient progress toward functional and medical goals  Care Tool:  Bathing  Bathing activity did not occur: Safety/medical concerns  Bathing assist       Upper Body Dressing/Undressing Upper body dressing   What is the patient wearing?: Pull over shirt    Upper body assist Assist Level: Maximal Assistance - Patient 25 - 49%    Lower Body  Dressing/Undressing Lower body dressing      What is the patient wearing?: Pants     Lower body assist Assist for lower body dressing: Moderate Assistance - Patient 50 - 74%     Toileting Toileting    Toileting assist Assist for toileting: Moderate Assistance - Patient 50 - 74%     Transfers Chair/bed transfer  Transfers assist     Chair/bed transfer assist level: Maximal Assistance - Patient 25 - 49%     Locomotion Ambulation   Ambulation assist   Ambulation activity did not occur: Safety/medical concerns          Walk 10 feet activity   Assist  Walk 10 feet activity did not occur: Safety/medical concerns        Walk 50 feet activity   Assist Walk 50 feet with 2 turns activity did not occur: Safety/medical concerns         Walk 150 feet activity   Assist Walk 150 feet activity did not occur: Safety/medical concerns         Walk 10 feet on uneven surface  activity   Assist Walk 10 feet on uneven surfaces activity did not occur: Safety/medical concerns         Wheelchair     Assist Will patient use wheelchair at discharge?: Yes Type of Wheelchair: Manual    Wheelchair assist level: Supervision/Verbal cueing Max wheelchair distance: 150'    Wheelchair 50 feet with 2 turns activity    Assist        Assist Level: Supervision/Verbal cueing   Wheelchair 150 feet activity     Assist      Assist Level: Supervision/Verbal cueing   Blood pressure (!) 162/88, pulse (!) 56, temperature 98 F (36.7 C), resp. rate 18, height 6' (1.829 m), weight 66.2 kg, SpO2 96 %.  Medical Problem List and Plan: 1.Impaired mobility/ADLs/Functionsecondary to Cauda equina syndrome, L shoulder dislocation s/p repair (very unstable L shoulder) NWB x 6 weeks, and L trimalleolar fx s/p ORIF NWB 6+ weeks with neurogenic bowel and bladder. 2. Antithrombotics: -DVT/anticoagulation:Pharmaceutical:Lovenox -antiplatelet  therapy: N/A 3. Pain Management:On tylenol 1000 mg qid, ultram 25 mg qid, robaxin 500 mg qid, Celebrex bid and Oxycodone prn.Will start Gabapentin for nerve pain 4. Mood:LCSW to follow for evaluation and support -antipsychotic agents: N/A 5. Neuropsych: This patientiscapable of making decisions onhisown behalf. 6. Skin/Wound Care:Routine pressure relief measures. 7. Fluids/Electrolytes/Nutrition:Monitor I/O. Check lytes in am.  8. Left shoulder dislocation s/p arthroscopy: Sling with NWB X 5 more weeks. 9. ORIF Left trimalleolar ankle fracture: NWB LLE 10 ABLA: Continue to monitor.Recheck in am.  11/11- Hb 12 11. Neurogenic bowel:Will check KUB. Discontinue Celebrex as reports of abdominal pain--has lot of neuropathic pain. Surgery to start gabapentin. Change Senna S to plain senna. Continue miralax. Add Anusol HC cream for hems.Will start Bowel program once gets to inpt rehab in evening- with dig stimulation- went over with pt and wife need for dig stim to full evacuate bowels. 12. Neurogenic bladder:Continue foley for now--voiding trial once bowel program established. Will not be able to self cath with LUE out of commission.Check UA/UCS.Once gets to rehab, will start Flomax, and then get foley out, if possible, to see if gets return-  if not, then will decide at that time (due to LUE NWB) if he wants foley or in/out cathing. Will also d/c oxybutynin since not having bladder spasms.  11/11- started flomax 0.4 mg nightly and bowel program 13. Steroid induced hyperglycemia:Question timing of weaning.     LOS: 1 days A FACE TO FACE EVALUATION WAS PERFORMED  Louis Jordan 10/26/2019, 7:16 PM

## 2019-10-26 NOTE — Discharge Summary (Signed)
Patient ID: Louis Jordan [id number] February 22, 1956 63 y.o.  Admit date: 10/15/2019 Discharge date: 10/25/2019  Admitting Diagnosis: Fall from ladder L1 burst fracture with retropulsion and spinal stenosis Left anterior shoulder dislocation  Left ankle trimalleolar fracture  Discharge Diagnosis Patient Active Problem List   Diagnosis Date Noted  . Spinal cord injury, 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  . Shoulder dislocation, left, initial encounter   . Closed burst fracture of lumbar vertebra (HCC)   . Cauda equina spinal cord injury (HCC)   . Closed fracture of left ankle   . Fall from ladder 10/22/2019  . 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 Closed displaced trimalleolar fracture of left ankle 10/22/2019  . S/P lumbar fusion 10/16/2019  . Fracture of lumbar spine without lesion of spinal cord [facility]) 10/15/2019    Consultants Dr. Murphy/Dr. Everardo Pacific, ortho Dr. Jena Gauss, ortho trauma Dr. Yetta Barre, NS  Reason for Admission: This is a 63 year old male in good health who fell about twelve feet from a ladder, landing on top of the ladder on pavement.  Complaining of pain left shoulder, left ankle, lower back.  Found to have a L1 burst fracture with retropulsion, dislocated left shoulder and left ankle fracture.  Neurosurgery plans surgery tomorrow.  Awaiting Ortho plans.  Procedures Dr. Ramond Marrow, 10/19/19  Arthroscopic labral repair and capsulorrhaphy   Arthroscopic loose body removal  Arthroscopic extensive debridement  Dr. Caryn Bee Haddix, 10/19/19  Open reduction internal fixation of left trimalleolar ankle fracture Dr. Marikay Alar, 10/16/19  1.  Open reduction internal fixation of L1 fracture with decompressive  lumbar laminectomy, medial facetectomy and foraminotomy T12-L1  L1-L2 with removal of epidural hematoma and reduction of fracture f ragment  2. Posterior fixation T10-L4 using Alphatec cortical pedicle screws.   3. Intertransverse arthrodesis L4 inclusive using  morcellized  autograft and allograft soaked with a bone marrow aspirate obtained  through separate fascial incisions over the left and right iliac crests.  Hospital Course:  Fall from ladder L 1 burst fracture The patient was admitted and placed in a brace and flat only due to this unstable fracture.  He was then taken to the OR the following day for the above fusion procedure by Dr. Yetta Barre for stabilization.  He had a Davol drain placed.  He did well from this.  His drain was able to be removed several days after surgery with low output.  Due to his other orthopedic injuries he had a special LSO brace made for him to be able to mobilize in.  He worked with therapies who recommended CIR.  Neurogenic bowel and bladder Because of his L1 burst fracture, he was noted to have an injury to his conus as well.  Unfortunately the patient developed a neurogenic bladder and bowel from this.  A foley remained in place during his stay and he was started on multiple medications for bowel regimen.  He even had a SMOG enema given with no results.  He was then started on QID suppositories for rectal stimulation until he could get to CIR to start in a definitive rehab bowel stimulation plan.  L anterior shoulder dislocation This was initially reduced in the ED and dislocated again one other time prior to surgery.  He was then taken to the OR by Dr. Everardo Pacific for labral repair and capsulorrhaphy. He was placed in an abduction pillow sling.  It will need to remain in this position for 6-8 weeks.  He is otherwise NWB to this  arm.  L ankle trimalleolar fracture He was also noted to have a fracture of his left ankle on admission as well.  He was placed in a splint and evaluated by ortho trauma.  He was taken to the OR on the same day as his shoulder and underwent an ORIF.  He is NWB to this extremity as well.   ABL anemia Expected drop noted after all of his surgeries.  This stabilized out with no further needs.  The  patient had multi-modal pain control during his stay.  He also worked with therapies who recommended CIR after discharge.  This was approved and the patient was stable for rehab on HD 10.  Physical Exam: Gen: NAD Heart: regular Lungs: CTAB Abd: soft, NT,mild bloating, +BS GU: foley in place with clear yellow urine, still numb in perineum Ext: LUE wounds c/d/i. Splint in place to left ankle. Wiggles toes withsensation, but states they are slightly numb.  Allergies as of 10/25/2019      Reactions   Penicillins Swelling      Medication List    ASK your doctor about these medications   ALEVE PM PO Take 2 tablets by mouth as needed (sleep).   ibuprofen 200 MG tablet Commonly known as: ADVIL Take 400 mg by mouth every 6 (six) hours as needed for headache.   MULTIVITAMIN ADULT PO Take 1 tablet by mouth daily.     All inpatient medications will be continued in Zearing, Dax T, MD. Schedule an appointment as soon as possible for a visit in 5 weeks.   Specialty: Orthopedic Surgery Why: For post-op check Contact information: [address] [place] [address] [phone]        Shona Needles, MD. Schedule an appointment as soon as possible for a visit in 2 week(s).   Specialty: Orthopedic Surgery Why: in 1-2 weeks for splint removal and repeat x-rays left ankle Contact information: [place] [address] [phone]         Dr. Sherley Bounds with Neurosurgery, this will be updated in his AVS in CIR  Signed: Saverio Danker, [facility] Surgery 10/26/2019, 11:17 AM Please see Amion for pager number during day hours 7:00am-4:30pm

## 2019-10-26 NOTE — Evaluation (Signed)
Physical Therapy Assessment and Plan  Patient Details  Name: Louis Jordan MRN: [id number] Date of Birth: 09/13/1956  PT Diagnosis: Difficulty walking and Impaired sensation Rehab Potential: Good ELOS: 12-14 days   Today's Date: 10/26/2019 PT Individual Time:  0900-1015 PT Minutes: 75 min      Problem List:  Patient Active Problem List   Diagnosis Date Noted  . Spinal cord injury, thoracic (T7-T12) ([place]) 10/25/2019  . Shoulder dislocation, left, initial encounter   . Closed burst fracture of lumbar vertebra ([place])   . Cauda equina spinal cord injury ([place])   . Closed fracture of left ankle   . Fall from ladder 10/22/2019  . 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 Closed displaced trimalleolar fracture of left ankle 10/22/2019  . S/P lumbar fusion 10/16/2019  . Fracture of lumbar spine without lesion of spinal cord ([place]) 10/15/2019    Past Medical History:  Past Medical History:  Diagnosis Date  . Bundle branch block, right   . Complication of anesthesia   . Hand pain    mild OA bilateral  hands  . Left knee injury 1970   requiring surgery for repair of ligaments/cartilage damage with nerve injury that took 20 years to resolve  . MVP (mitral valve prolapse)   . PONV (postoperative nausea and vomiting)    Past Surgical History:  Past Surgical History:  Procedure Laterality Date  . APPENDECTOMY    . KNEE SURGERY Right   . LUMBAR SPINE SURGERY  10/16/2019   L1 burst fracture with epidural hematoma and canal stenosis  . OPEN REDUCTION INTERNAL FIXATION (ORIF) TIBIA/FIBULA FRACTURE Left 10/19/2019   Procedure: OPEN REDUCTION INTERNAL FIXATION (ORIF) TIBIA/FIBULA FRACTURE;  Surgeon: Shona Needles, MD;  Location: [facility];  Service: Orthopedics;  Laterality: Left;  . SHOULDER ARTHROSCOPY WITH BANKART REPAIR Left 10/19/2019   Procedure: SHOULDER ARTHROSCOPY WITH BANKART REPAIR, EXTENSIVE DEBRIDEMENT, LOOSE BODY REMOVAL;  Surgeon: Hiram Gash, MD;  Location: [facility];   Service: Orthopedics;  Laterality: Left;  . STRABISMUS SURGERY    . TONSILLECTOMY      Assessment & Plan Clinical Impression:  Louis Jordan is a 10 year oldR handedmale with who fell 12 feet off a ladder on 10/15/19 with subsequent L1 burst fracture with retropulsion, left anterior shoulder dislocation and left trimalleolar ankle fracture. Shoulder reduced by trauma with sling recommended and left ankle splinted. He was taken to OR for ORIF L1 fracture with decompressive laminectomy with removal of epidural hematoma, medial facetectomy and foraminotomy with posterior fixation T10- L4 and intertransverse arthrodesis of L4 by Dr. Ronnald Ramp. Noted to have perineal numbness due to conus injury with neurogenic bowel and bladder. He did have repeat dislocation of left shoulder requiring reduction 11/2 and Dr. Griffin Basil consulted who felt that patient with Bankart injury --arthroscopic stabilization recommended. He was taken to OR 11/4 for arthroscopy with removal of loose bodies and labral repair and capsulorrhaphy as well as ORIF left trimalleolar ankle fracture by Dr. Doreatha Martin. Post op NWB LLE and NWB LUE for 6 weeks with gentle stretching of elbow due to highly unstable shoulder.   Custom LSO ordered for back support as sling inhibits TLSO use. Foley in place due to urinary retention. He continues on decadron 4 mg qid with elevated fasting BS. He continues to have saddle anesthesia and received SMOG enema 11/09 and on dulcolax suppositories bid.He reports saddle anesthesia with abdominal pain, LLE > back pain that is constant. Is tolerating increase in activity but does  get nauseous with fatigue. 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 CIR recommended due to functional decline. Patient transferred to CIR on 10/25/2019 .   Patient currently requires max with mobility secondary to muscle weakness and decreased standing balance, decreased postural control, decreased balance  strategies and difficulty maintaining precautions.  Prior to hospitalization, patient was independent  with mobility and lived with Spouse in a House home.  Home access is 1Stairs to enter.  Patient will benefit from skilled PT intervention to maximize safe functional mobility, minimize fall risk and decrease caregiver burden for planned discharge home with 24 hour assist.  Anticipate patient will benefit from follow up [facility] at discharge.  PT - End of Session Activity Tolerance: Tolerates 30+ min activity with multiple rests Endurance Deficit: Yes Endurance Deficit Description: frequent rest breaks during functional activities PT Assessment Rehab Potential (ACUTE/IP ONLY): Good PT Barriers to Discharge: Medical stability;Home environment access/layout;Incontinence;Weight bearing restrictions PT Plan PT Intensity: Minimum of 1-2 x/day ,45 to 90 minutes PT Frequency: 5 out of 7 days PT Duration Estimated Length of Stay: 12-14 days PT Treatment/Interventions: Ambulation/gait training;Balance/vestibular training;Community reintegration;Discharge planning;DME/adaptive equipment instruction;Functional mobility training;Pain management;Patient/family education;Psychosocial support;Stair training;Therapeutic Activities;Therapeutic Exercise;UE/LE Strength taining/ROM;UE/LE Coordination activities;Wheelchair propulsion/positioning PT Recommendation Recommendations for Other Services: Neuropsych consult;Therapeutic Recreation consult Therapeutic Recreation Interventions: Stress management;Outing/community reintergration Follow Up Recommendations: Home health PT Patient destination: Home Equipment Recommended: Wheelchair (measurements);Wheelchair cushion (measurements);To be determined Equipment Details: TBD pending progress  Skilled Therapeutic Intervention Evaluation completed (see details above and below) with education on PT POC and goals and individual treatment initiated with focus on functional  transfer assessment. Pt received sidelying on R side in bed, agreeable to PT eval. Pt reports 3/10 pain at rest in LLE and low back, no increase in pain during session. Pt is dependent with assist x 2 to don LSO while in supine/sidelying in bed, min A for rolling to R. Pt unable to roll to the L due to shoulder sling and precautions. Supine to sit via log roll with min A for LLE management. Sit to stand with max A with no AD. Stand pivot transfer to [facility] with no AD and max A, fair ability to maintain NWBing precautions. Pt unable to void once seated on BSC. Stand pivot transfer back to bed with use of hemiwalker and mod A. Pt dependent to don brief while on EOB. Stand pivot transfer bed to w/c with hemiwalker and mod to max A. Pt left seated in w/c in room with needs in reach with LLE elevated and end of session.  PT Evaluation Precautions/Restrictions Precautions Precautions: Fall;Back Precaution Comments: NWB LUE in sling Required Braces or Orthoses: Sling;Spinal Brace Spinal Brace: Applied in sitting position(pt prefers to don in supine) Spinal Brace Comments: custom LSO Restrictions Weight Bearing Restrictions: Yes LUE Weight Bearing: Non weight bearing LLE Weight Bearing: Non weight bearing Home Living/Prior Functioning Home Living Available Help at Discharge: Family;Available 24 hours/day Type of Home: House Home Access: Stairs to enter [employer] of Steps: 1 Home Layout: One level Bathroom Shower/Tub: Multimedia programmer: Standard  Lives With: Spouse Prior Function Level of Independence: Independent with gait;Independent with transfers  Able to Take Stairs?: Yes Driving: Yes Leisure: Hobbies-yes (Comment) Comments: Exercises regularly Vision/Perception  Perception Perception: Within Functional Limits Praxis Praxis: Intact  Cognition Overall Cognitive Status: Within Functional Limits for tasks assessed Arousal/Alertness: Awake/alert Orientation Level:  Oriented X4 Attention: Focused Focused Attention: Appears intact Selective Attention: Appears intact Memory:  Appears intact Immediate Memory Recall: Sock;Bed;Blue Memory Recall Sock: Without Cue Memory Recall Blue: Without Cue Memory Recall Bed: Without Cue Awareness: Appears intact Problem Solving: Appears intact Safety/Judgment: Appears intact Sensation Sensation Light Touch: Impaired Detail Peripheral sensation comments: Sacral sensory impairment and along L4 dermatome Light Touch Impaired Details: Impaired RLE(medial aspect of foot and ankle; impaired in sacral region) Proprioception: Appears Intact Coordination Gross Motor Movements are Fluid and Coordinated: No Fine Motor Movements are Fluid and Coordinated: No Coordination and Movement Description: impaired 2/2 multi trauma and WBing precautions Motor  Motor Motor: Abnormal postural alignment and control Motor - Skilled Clinical Observations: impaired 2/2 multi trauma and WBing precautions  Mobility Bed Mobility Bed Mobility: Rolling Right;Rolling Left;Supine to Sit;Sit to Supine Rolling Right: Minimal Assistance - Patient > 75% Rolling Left: Other (comment)(unable to 2/2 LUE sling) Supine to Sit: Minimal Assistance - Patient > 75% Sit to Supine: Minimal Assistance - Patient > 75% Transfers Transfers: Sit to Stand;Stand Pivot Transfers Sit to Stand: Maximal Assistance - Patient 25-49% Stand Pivot Transfers: Maximal Assistance - Patient 25 - 49% Stand Pivot Transfer Details: Verbal cues for precautions/safety;Verbal cues for safe use of DME/AE Transfer (Assistive device): Hemi-walker Locomotion  Stairs / Additional Locomotion Stairs: No Wheelchair Mobility Wheelchair Mobility: Yes Wheelchair Assistance: Chartered loss adjuster: Right upper extremity;Right lower extremity Wheelchair Parts Management: Needs assistance  Trunk/Postural Assessment  Cervical Assessment Cervical Assessment:  Within Functional Limits Thoracic Assessment Thoracic Assessment: Within Functional Limits Lumbar Assessment Lumbar Assessment: Exceptions to WFL(posterior pelvic tilt) Postural Control Postural Control: Deficits on evaluation Righting Reactions: impaired  Balance Balance Balance Assessed: Yes Static Sitting Balance Static Sitting - Balance Support: No upper extremity supported;Feet supported Static Sitting - Level of Assistance: 5: Stand by assistance Dynamic Sitting Balance Dynamic Sitting - Balance Support: No upper extremity supported;Feet supported;During functional activity Dynamic Sitting - Level of Assistance: 5: Stand by assistance Static Standing Balance Static Standing - Balance Support: Right upper extremity supported;During functional activity Static Standing - Level of Assistance: 3: Mod assist Extremity Assessment  RUE Assessment RUE Assessment: Within Functional Limits LUE Assessment LUE Assessment: Exceptions to [facility] General Strength Comments: NWB LUE, in shoulder immobilizer gentle PROM ok to elbow RLE Assessment RLE Assessment: Within Functional Limits General Strength Comments: 4/5 hip flex, 5/5 knee flex/ext and ankle DF LLE Assessment LLE Assessment: Exceptions to [facility] Passive Range of Motion (PROM) Comments: ankle ROM limited by recent surgery and cast General Strength Comments: 5/5 hip and knee, ankle testing deferred 2/2 recent surgery    Refer to Care Plan for Long Term Goals  Recommendations for other services: Therapeutic Recreation  Stress management and Outing/community reintegration  Discharge Criteria: Patient will be discharged from PT if patient refuses treatment 3 consecutive times without medical reason, if treatment goals not met, if there is a change in medical status, if patient makes no progress towards goals or if patient is discharged from hospital.  The above assessment, treatment plan, treatment alternatives and goals were discussed  and mutually agreed upon: by patient and by family  Excell Seltzer, PT, DPT 10/26/2019, 4:13 PM

## 2019-10-26 NOTE — Evaluation (Signed)
Occupational Therapy Assessment and Plan  Patient Details  Name: Louis Jordan MRN: [id number] Date of Birth: 02-12-1956  OT Diagnosis: acute pain and impaired sensation Rehab Potential: Rehab Potential (ACUTE ONLY): Good ELOS: 14-18 days   Today's Date: 10/26/2019 OT Individual Time: 4098-1191 OT Individual Time Calculation (min): 60 min     Problem List:  Patient Active Problem List   Diagnosis Date Noted  . Spinal cord injury, thoracic (T7-T12) ([place]) 10/25/2019  . Shoulder dislocation, left, initial encounter   . Closed burst fracture of lumbar vertebra ([place])   . Cauda equina spinal cord injury ([place])   . Closed fracture of left ankle   . Fall from ladder 10/22/2019  . 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 Closed displaced trimalleolar fracture of left ankle 10/22/2019  . S/P lumbar fusion 10/16/2019  . Fracture of lumbar spine without lesion of spinal cord ([place]) 10/15/2019    Past Medical History:  Past Medical History:  Diagnosis Date  . Bundle branch block, right   . Complication of anesthesia   . Hand pain    mild OA bilateral  hands  . Left knee injury 1970   requiring surgery for repair of ligaments/cartilage damage with nerve injury that took 20 years to resolve  . MVP (mitral valve prolapse)   . PONV (postoperative nausea and vomiting)    Past Surgical History:  Past Surgical History:  Procedure Laterality Date  . APPENDECTOMY    . KNEE SURGERY Right   . LUMBAR SPINE SURGERY  10/16/2019   L1 burst fracture with epidural hematoma and canal stenosis  . OPEN REDUCTION INTERNAL FIXATION (ORIF) TIBIA/FIBULA FRACTURE Left 10/19/2019   Procedure: OPEN REDUCTION INTERNAL FIXATION (ORIF) TIBIA/FIBULA FRACTURE;  Surgeon: Shona Needles, MD;  Location: [facility];  Service: Orthopedics;  Laterality: Left;  . SHOULDER ARTHROSCOPY WITH BANKART REPAIR Left 10/19/2019   Procedure: SHOULDER ARTHROSCOPY WITH BANKART REPAIR, EXTENSIVE DEBRIDEMENT, LOOSE BODY REMOVAL;   Surgeon: Hiram Gash, MD;  Location: [place];  Service: Orthopedics;  Laterality: Left;  . STRABISMUS SURGERY    . TONSILLECTOMY      Assessment & Plan Clinical Impression: Louis Jordan is a 36 year oldR handedmale with who fell 12 feet off a ladder on 10/15/19 with subsequent L1 burst fracture with retropulsion, left anterior shoulder dislocation and left trimalleolar ankle fracture. Shoulder reduced by trauma with sling recommended and left ankle splinted. He was taken to OR for ORIF L1 fracture with decompressive laminectomy with removal of epidural hematoma, medial facetectomy and foraminotomy with posterior fixation T10- L4 and intertransverse arthrodesis of L4 by Dr. Ronnald Ramp. Noted to have perineal numbness due to conus injury with neurogenic bowel and bladder. He did have repeat dislocation of left shoulder requiring reduction 11/2 and Dr. Griffin Basil consulted who felt that patient with Bankart injury --arthroscopic stabilization recommended. He was taken to OR 11/4 for arthroscopy with removal of loose bodies and labral repair and capsulorrhaphy as well as ORIF left trimalleolar ankle fracture by Dr. Doreatha Martin. Post op NWB LLE and NWB LUE for 6 weeks with gentle stretching of elbow due to highly unstable shoulder.   Custom LSO ordered for back support as sling inhibits TLSO use. Foley in place due to urinary retention. He continues on decadron 4 mg qid with elevated fasting BS. He continues to have saddle anesthesia and received SMOG enema 11/09 and on dulcolax suppositories bid.He reports saddle anesthesia with abdominal pain, LLE > back pain that is constant. Is tolerating increase  in activity but does get nauseous with fatigue. 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 CIR recommended due to functional decline.  Patient transferred to CIR on 10/25/2019 .    Patient currently requires mod with basic self-care skills secondary to impaired sensation and decreased  sitting balance, decreased standing balance, decreased postural control, hemiplegia, decreased balance strategies and difficulty maintaining precautions.  Prior to hospitalization, patient could complete ADLs with independent .  Patient will benefit from skilled intervention to decrease level of assist with basic self-care skills and increase level of independence with iADL prior to discharge home with care partner.  Anticipate patient will require intermittent supervision and minimal physical assistance and follow up outpatient.  OT - End of Session Activity Tolerance: Tolerates 10 - 20 min activity with multiple rests Endurance Deficit: Yes Endurance Deficit Description: frequent rest breaks during functional activities OT Assessment Rehab Potential (ACUTE ONLY): Good OT Patient demonstrates impairments in the following area(s): Balance;Safety;Sensory;Skin Integrity;Endurance;Motor;Pain OT Basic ADL's Functional Problem(s): Bathing;Dressing;Toileting OT Transfers Functional Problem(s): Toilet;Tub/Shower OT Additional Impairment(s): None OT Plan OT Intensity: Minimum of 1-2 x/day, 45 to 90 minutes OT Frequency: 5 out of 7 days OT Duration/Estimated Length of Stay: 14-18 days OT Treatment/Interventions: Balance/vestibular training;Discharge planning;Pain management;Self Care/advanced ADL retraining;Therapeutic Activities;UE/LE Coordination activities;Wheelchair propulsion/positioning;UE/LE Strength taining/ROM;Splinting/orthotics;Psychosocial support;DME/adaptive equipment instruction;Community reintegration;Disease mangement/prevention;Functional mobility training;Patient/family education;Therapeutic Exercise OT Self Feeding Anticipated Outcome(s): no goal set OT Basic Self-Care Anticipated Outcome(s): (S) OT Toileting Anticipated Outcome(s): (S) OT Bathroom Transfers Anticipated Outcome(s): (S) OT Recommendation Patient destination: Home Follow Up Recommendations: Outpatient OT Equipment  Recommended: Tub/shower seat   Skilled Therapeutic Intervention Skilled OT evaluation completed. Pt edu on ELOS, OT POC, rehab expectations, and goals. Pt completed sit > stands throughout session with use of hemi walker and mod/max A. Cueing required for adherence to NWB LLE precautions. Pt completed UB dressing bed level with max A. Mod A to don pants with use of reacher. Edu provided re back precautions and impact on ADLs. Pt very motivated to return to PLOF and was very active prior to injury. Pt will benefit from 2- 2 1/2 weeks of skilled CIR. Pt left supine with all needs met, bed alarm set.   OT Evaluation Precautions/Restrictions  Precautions Precautions: Fall;Back Precaution Comments: NWB LUE in sling Required Braces or Orthoses: Sling;Spinal Brace Spinal Brace: Applied in sitting position Spinal Brace Comments: custom LSO, pt prefers to don supine Restrictions Weight Bearing Restrictions: Yes LUE Weight Bearing: Non weight bearing LLE Weight Bearing: Non weight bearing General Chart Reviewed: Yes Family/Caregiver Present: No Vital Signs  Pain Pain Assessment Pain Scale: 0-10 Pain Score: 8  Pain Type: Acute pain Pain Location: Back Pain Orientation: Lower Pain Descriptors / Indicators: Aching Pain Onset: On-going Pain Intervention(s): Emotional support;Distraction;Repositioned Home Living/Prior Functioning Home Living Family/patient expects to be discharged to:: Private residence Living Arrangements: Spouse/significant other Available Help at Discharge: Family, Available 24 hours/day Type of Home: House Home Access: Stairs to enter Technical brewer of Steps: 1 Home Layout: One level Bathroom Shower/Tub: Multimedia programmer: Standard  Lives With: Spouse IADL History Homemaking Responsibilities: Yes Meal Prep Responsibility: Secondary Laundry Responsibility: Secondary Cleaning Responsibility: Secondary Bill Paying/Finance Responsibility:  Secondary Current License: Yes Mode of Transportation: Car Occupation: Full time employment Type of Occupation: Owns own business, 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 Prior Function Level of Independence: Independent with gait, Independent with transfers  Able to Take Stairs?: Yes Driving: Yes Leisure: Hobbies-yes (  Comment) Comments: Exercises regularly ADL ADL Equipment Provided: Reacher Eating: Set up Where Assessed-Eating: Chair Grooming: Setup Where Assessed-Grooming: Sitting at sink Upper Body Bathing: Moderate assistance Where Assessed-Upper Body Bathing: Sitting at sink Lower Body Bathing: Minimal assistance Where Assessed-Lower Body Bathing: Standing at sink Upper Body Dressing: Maximal assistance Where Assessed-Upper Body Dressing: Bed level Lower Body Dressing: Moderate assistance Where Assessed-Lower Body Dressing: Standing at sink, Sitting at sink Toileting: Moderate assistance Where Assessed-Toileting: Bedside Commode Toilet Transfer: Moderate assistance Toilet Transfer Method: Stand pivot Science writer: Radiographer, therapeutic: Unable to assess Vision Baseline Vision/History: No visual deficits Patient Visual Report: No change from baseline Vision Assessment?: No apparent visual deficits Perception  Perception: Within Functional Limits Praxis Praxis: Intact Cognition Overall Cognitive Status: Within Functional Limits for tasks assessed Arousal/Alertness: Awake/alert Orientation Level: Person;Place;Situation Person: Oriented Place: Oriented Situation: Oriented Year: 2020 Month: November Day of Week: Correct Memory: Appears intact Immediate Memory Recall: Sock;Bed;Blue Memory Recall Sock: Without Cue Memory Recall Blue: Without Cue Memory Recall Bed: Without Cue Attention: Selective Focused Attention: Appears intact Selective Attention: Appears intact Awareness: Appears intact Problem Solving: Appears  intact Safety/Judgment: Appears intact Sensation Sensation Light Touch: Impaired Detail Peripheral sensation comments: Sacral sensory impairment and along L4 dermatome Light Touch Impaired Details: Impaired RLE Proprioception: Appears Intact Coordination Gross Motor Movements are Fluid and Coordinated: No Fine Motor Movements are Fluid and Coordinated: No Coordination and Movement Description: impaired 2/2 multi trauma and WBing precautions Motor  Motor Motor: Abnormal postural alignment and control Motor - Skilled Clinical Observations: impaired 2/2 multi trauma and WBing precautions Mobility  Bed Mobility Bed Mobility: Rolling Right;Rolling Left;Supine to Sit;Sit to Supine Rolling Right: Minimal Assistance - Patient > 75% Rolling Left: Other (comment)(unable to 2/2 sling) Supine to Sit: Minimal Assistance - Patient > 75% Sit to Supine: Minimal Assistance - Patient > 75% Transfers Sit to Stand: Maximal Assistance - Patient 25-49%  Trunk/Postural Assessment  Cervical Assessment Cervical Assessment: Within Functional Limits Thoracic Assessment Thoracic Assessment: Within Functional Limits Lumbar Assessment Lumbar Assessment: Exceptions to WFL(posterior pelvic tilt) Postural Control Postural Control: Deficits on evaluation Righting Reactions: impaired  Balance Balance Balance Assessed: Yes Static Sitting Balance Static Sitting - Balance Support: No upper extremity supported;Feet supported Static Sitting - Level of Assistance: 5: Stand by assistance Dynamic Sitting Balance Dynamic Sitting - Balance Support: No upper extremity supported;Feet supported;During functional activity Dynamic Sitting - Level of Assistance: 5: Stand by assistance Static Standing Balance Static Standing - Balance Support: Right upper extremity supported;During functional activity Static Standing - Level of Assistance: 3: Mod assist Extremity/Trunk Assessment RUE Assessment RUE Assessment: Within  Functional Limits LUE Assessment LUE Assessment: Exceptions to [facility] General Strength Comments: NWB LUE, in shoulder immobilizer gentle PROM ok to elbow     Refer to Care Plan for Long Term Goals  Recommendations for other services: None    Discharge Criteria: Patient will be discharged from OT if patient refuses treatment 3 consecutive times without medical reason, if treatment goals not met, if there is a change in medical status, if patient makes no progress towards goals or if patient is discharged from hospital.  The above assessment, treatment plan, treatment alternatives and goals were discussed and mutually agreed upon: by patient  Curtis Sites 10/26/2019, 12:27 PM

## 2019-10-26 NOTE — Progress Notes (Signed)
Inpatient Rehabilitation  Patient information reviewed and entered into eRehab system by Querida Beretta M. Jaclyn Carew, M.A., CCC/SLP, PPS Coordinator.  Information including medical coding, functional ability and quality indicators will be reviewed and updated through discharge.    

## 2019-10-26 NOTE — Progress Notes (Signed)
Physical Therapy Session Note  Patient Details  Name: Louis Jordan MRN: [id number] Date of Birth: 07-31-1956  Today's Date: 10/26/2019 PT Individual Time: 1330-1430 PT Individual Time Calculation (min): 60 min   Short Term Goals: Week 1:  PT Short Term Goal 1 (Week 1): Pt will complete least restrictive transfer with min A PT Short Term Goal 2 (Week 1): Pt will initiate gait training PT Short Term Goal 3 (Week 1): Pt will tolerate sitting up out of bed x 2 hours  Skilled Therapeutic Interventions/Progress Updates:  Pt received sidelying in bed, agreeable to PT session. No complaints of pain. Pt reports feeling fatigued from AM sessions but agreeable to therapy this afternoon. Supine to sit via logroll with min A for trunk control. Pt is dependent x 2 to don LSO while seated EOB. Slide board transfer bed to w/c to the R with min A with cueing for head/hips relationship and maintaining WBing precautions. Manual w/c propulsion x 150 ft with use of R UE/LE x 150 ft. Slide board transfer back to bed to the R with min A. Pt is dependent to don tank top while seated EOB via pulling up over hips, max A to stand. Sit to supine mod A for BLE management. Pt requesting therex to perform in his room during downtime. Supine R ankle resisted DF/PF with orange theraband and L heel slides demonstrated. [name] also present during 2nd half of PM session. Answered questions with regards to d/c planning, equipment, etc. Provided home measurement sheet to wife. Pt left semi-reclined in bed with needs in reach, bed alarm in place, wife present at end of session.  Therapy Documentation Precautions:  Precautions Precautions: Fall, Back Precaution Comments: NWB LUE in sling Required Braces or Orthoses: Sling, Spinal Brace Spinal Brace: Applied in sitting position(pt prefers to don in supine) Spinal Brace Comments: custom LSO Restrictions Weight Bearing Restrictions: Yes LUE Weight Bearing: Non weight bearing LLE  Weight Bearing: Non weight bearing   Therapy/Group: Individual Therapy   Excell Seltzer, PT, DPT  10/26/2019, 4:25 PM

## 2019-10-27 ENCOUNTER — Inpatient Hospital Stay (HOSPITAL_COMMUNITY): Payer: Commercial Managed Care - PPO | Admitting: Physical Therapy

## 2019-10-27 ENCOUNTER — Inpatient Hospital Stay (HOSPITAL_COMMUNITY): Payer: Commercial Managed Care - PPO | Admitting: Occupational Therapy

## 2019-10-27 MED ORDER — OXYCODONE HCL 5 MG PO TABS
15.0000 mg | ORAL_TABLET | ORAL | Status: DC | PRN
  Administered 2019-10-27 – 2019-11-08 (×20): 15 mg via ORAL
  Filled 2019-10-27 (×21): qty 3

## 2019-10-27 MED ORDER — CHLORHEXIDINE GLUCONATE CLOTH 2 % EX PADS
6.0000 | MEDICATED_PAD | Freq: Two times a day (BID) | CUTANEOUS | Status: DC
  Administered 2019-10-27 – 2019-10-28 (×2): 6 via TOPICAL

## 2019-10-27 MED ORDER — OXYCODONE HCL 5 MG PO TABS
15.0000 mg | ORAL_TABLET | Freq: Once | ORAL | Status: AC
  Administered 2019-10-27: 15 mg via ORAL

## 2019-10-27 MED ORDER — GABAPENTIN 400 MG PO CAPS
400.0000 mg | ORAL_CAPSULE | Freq: Three times a day (TID) | ORAL | Status: DC
  Administered 2019-10-27 – 2019-11-08 (×37): 400 mg via ORAL
  Filled 2019-10-27 (×37): qty 1

## 2019-10-27 NOTE — Progress Notes (Signed)
Occupational Therapy Session Note  Patient Details  Name: Louis Jordan MRN: [id number] Date of Birth: 1956/03/19  Today's Date: 10/27/2019 OT Individual Time: 5427-0623 and 1100-1155 OT Individual Time Calculation (min): 75 min and 55 min   Short Term Goals: Week 1:  OT Short Term Goal 1 (Week 1): Pt will instruct caregiver in donning shirt with min cueing OT Short Term Goal 2 (Week 1): Pt will don pants with LRAD with min A OT Short Term Goal 3 (Week 1): Pt will complete toileting tasks with min A  Skilled Therapeutic Interventions/Progress Updates:    Session One: Pt seen for OT session focusing on functional transfers and ADL re-training. Pt in supine upon arrival, no formal complaints of pain and agreeable to tx session. Transfers: Supine>EOB min A using hospital bed functions.  Sliding board transfer EOB>drop arm BSC, min A with assist to place board and therapist's foot placed under L LE to facilitate NWbing.  Sit>stand with min A using hemi-walker, max multi-modal cuing to facilitate L LE NWBing. Min A static standing balance. Squat pivot transfer drop arm BSC>w/c with min A overall, however +2 to maintain NWBing at L LE and assist at hips to position during transfer.  ADL re-training: LSO donned in supine total A, pt able to direct caregiver for technique.  Total A toileting task, sit<>stand with hemi-walker.  LB dressing with mod A, pt using reacher to assist in threading on pants, stood with min A with +2 required for safety in order to maintain Moody pre-cautions, provide steadying assist and total A for clothing management. Grooming tasks from w/c level at sink with assist for set-up.  Education provided throughout session regarding  SCI, bowel/bladder program, activity progression, DME, and d/c planning Pt left sitting up in w/c at end of session, all needs in reach.    Session Two: Pt seen for OT session focusing on ADL re-training and positioning. Pt sitting upright in w/c at  end of session, voicing fatigue from prolonged time in chair but willing to participate as able and requesting to shave at sink. He self propelled w/c throughout room using hemi-technique with supervision. He completed shaving and hair cutting task from w/c level at sink with assist for set-up throughout. Discussed with pt options for sitting locations and positioning as pt becoming very uncomfortable in w/c and fatigued. Encouraged pt to return to bed and rest in order to have energy and tolerance for PM therapy session, pt agreeable. Min A sliding board transfer to w/c with multi-modal cuing for head/hip relationship and technique. Mod A to return to supine. Pt positioned sitting upright in bed in prep for lunch. All needs in reach and bed alarm on.  RN made aware of pt's request for pain meds at end of session.   Therapy Documentation Precautions:  Precautions Precautions: Fall, Back Precaution Comments: NWB LUE in sling Required Braces or Orthoses: Sling, Spinal Brace Spinal Brace: Applied in sitting position(pt prefers to don in supine) Spinal Brace Comments: custom LSO Restrictions Weight Bearing Restrictions: Yes LUE Weight Bearing: Non weight bearing LLE Weight Bearing: Non weight bearing   Therapy/Group: Individual Therapy  Ananth Fiallos L 10/27/2019, 7:03 AM

## 2019-10-27 NOTE — Progress Notes (Signed)
Physical Therapy Session Note  Patient Details  Name: Louis Jordan MRN: [id number] Date of Birth: 06/26/1956  Today's Date: 10/27/2019 PT Individual Time: 1023-1053 PT Individual Time Calculation (min): 30 min   Short Term Goals: Week 1:  PT Short Term Goal 1 (Week 1): Pt will complete least restrictive transfer with min A PT Short Term Goal 2 (Week 1): Pt will initiate gait training PT Short Term Goal 3 (Week 1): Pt will tolerate sitting up out of bed x 2 hours  Skilled Therapeutic Interventions/Progress Updates:    Pt received sitting in w/c wearing his L UE sling, L LE cast, and custom LSO. Pt agreeable to therapy session. R UE and R LE w/c propulsion ~156ft to ortho gym with supervision and increased time. L stand pivot w/c>EOM using hemiwalker with mod assist for balance and +2 present for safety and to monitor L LE NWB (pt able to maintain). Sit>supine on R with min assist for L LE management and trunk descent. Performed the following exercises x30 reps each: - seated L LE long arc quad  - supine L LE straight leg raise - R sidelying L LE clamshell - R sidelying L LE hip abduction (required rest breaks & performed x12 reps, x10 reps, x8reps) Multimodal cuing for proper form/technique of each exercise with pt having most difficulty with sidelying hip abduction - pt very determined to perform exercises and regain his strength. Therapist provided pt with longer L LE elevating leg rest for improved pt comfort and positioning.  Supine>sitting with CGA for trunk upright. R squat pivot transfer EOM>w/c with mod assist. Transported back to room in w/c and left seated with needs in reach.    Therapy Documentation Precautions:  Precautions Precautions: Fall, Back Precaution Comments: NWB LUE in sling Required Braces or Orthoses: Sling, Spinal Brace Spinal Brace: Applied in sitting position(pt prefers to don in supine) Spinal Brace Comments: custom LSO Restrictions Weight Bearing  Restrictions: Yes LUE Weight Bearing: Non weight bearing LLE Weight Bearing: Non weight bearing  Pain: Reports L hip "soreness" stating this has been a chronic issue and then states overall pain level of 4/10 - reports premedicating.   Therapy/Group: Individual Therapy  Tawana Scale, PT, DPT 10/27/2019, 7:57 AM

## 2019-10-27 NOTE — Progress Notes (Signed)
Louis Jordan PHYSICAL MEDICINE & REHABILITATION PROGRESS NOTE   Subjective/Complaints:  Somewhat depressed this AM- admits "down". Leaking bowel currently- had BM last night however looser rectal sphincter so leaking stool intermittently.  Also really tight seatbelt too tight around T12/L1 dermatome per pt description. Is at level SCI pain. Will need to increase Gabapentin.  ROS- denies CP, SOB, N/V/D/C    Objective:   Dg Abd 1 View  Result Date: 10/25/2019 CLINICAL DATA:  Constipation for several days EXAM: ABDOMEN - 1 VIEW COMPARISON:  10/15/2019 FINDINGS: Scattered large and small bowel gas is noted. Mild retained fecal material is seen without significant constipation. Postsurgical changes are noted in the thoracolumbar spine. Compression deformity at L1 is noted similar to that seen on prior CT examination. No free air is noted. IMPRESSION: Postsurgical changes are seen. Mild retained fecal material is noted without significant constipation. Electronically Signed   By: Inez Catalina M.D.   On: 10/25/2019 22:36   Recent Labs    10/26/19 0456  WBC 10.6*  HGB 12.0*  HCT 35.3*  PLT 433*   Recent Labs    10/26/19 0456  NA 135  K 4.3  CL 98  CO2 28  GLUCOSE 114*  BUN 23  CREATININE 0.84  CALCIUM 8.6*    Intake/Output Summary (Last 24 hours) at 10/27/2019 0905 Last data filed at 10/27/2019 0739 Gross per 24 hour  Intake 1070 ml  Output 2050 ml  Net -980 ml     Physical Exam: Vital Signs Blood pressure 139/76, pulse (!) 53, temperature 98 F (36.7 C), temperature source Oral, resp. rate 18, height 6' (1.829 m), weight 66.2 kg, SpO2 100 %. Physical Exam Nursing noteand labs and vitalsreviewed. Constitutional:  More depressed affect; lying supine in bed; wearing sling for L shoulder, NAD HENT:  Head:Normocephalicand atraumatic. Eyes:Pupils are equal, round, and reactive to light.EOMare normal. Neck:Normal range of motion.Neck supple.   Cardiovascular: RRR- no M/R/G Respiratory: CTA B/L- no W/R/R UX:NATF. NT, ND, (+)BS Genitourinary: Genitourinary Comments: Foley in place. External hemorrhoids noted. . Musculoskeletal:  Comments: Left shoulder incision C/D/I- looks great. Splint in place left ankle- neurovascularly intact distal to splint/cast.   RUE biceps, triceps, WE, grip and finger bad 5/5 LUE- can only test grip/finger abd which were 5/5 RLE- HF 5-/5, KE 5-/5, DF 5-/5, PF 5-/5, EHL 4/5 LLE- HF 5-/5, KE 5-/5- EHL 4/5; couldn't test at ankle due to fx/cast Neurological: Louis Jordan is alertand oriented to person, place, and time. Intact sensation C1-T12 B/L RLE- Decreased sensation at L1 on R; L2/L3 nml, L4- S2 decreased on RLE LLE- L1, L2, L3 nml sensation; cannot test L4/L5 due to cast; S1/S2 decreased on LLE Buttocks- S3 decreased, S4 really decreased and S5 absent B/L Skin: Louis Jordan isnot diaphoretic. Back and hip incision C/D/I with steri-strips in place. No erythema, no drainage; look good. No other skin breakdown seen. Psychiatric:  Slightly depressed/flat affect, but polite/cordial     Assessment/Plan: 1. 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  Physiatrist is providing close team supervision and 24 hour management of active medical problems listed below.  Physiatrist and rehab team continue to assess barriers to discharge/monitor patient progress toward functional and medical goals  Care Tool:  Bathing  Bathing activity did not occur: Safety/medical concerns Body parts bathed by patient: Face  Bathing assist Assist Level: Minimal Assistance - Patient > 75%     Upper Body Dressing/Undressing Upper body dressing   What is the patient wearing?: Pull over  shirt    Upper body assist Assist Level: Moderate Assistance - Patient 50 - 74%    Lower Body Dressing/Undressing Lower body dressing      What is the patient wearing?: Pants     Lower body assist Assist for lower body dressing: Moderate Assistance - Patient 50 - 74%     Toileting Toileting    Toileting assist Assist for toileting: Maximal Assistance - Patient 25 - 49%     Transfers Chair/bed transfer  Transfers assist     Chair/bed transfer assist level: Maximal Assistance - Patient 25 - 49%     Locomotion Ambulation   Ambulation assist   Ambulation activity did not occur: Safety/medical concerns          Walk 10 feet activity   Assist  Walk 10 feet activity did not occur: Safety/medical concerns        Walk 50 feet activity   Assist Walk 50 feet with 2 turns activity did not occur: Safety/medical concerns         Walk 150 feet activity   Assist Walk 150 feet activity did not occur: Safety/medical concerns         Walk 10 feet on uneven surface  activity   Assist Walk 10 feet on uneven surfaces activity did not occur: Safety/medical concerns         Wheelchair     Assist Will patient use wheelchair at discharge?: Yes Type of Wheelchair: Manual    Wheelchair assist level: Supervision/Verbal cueing Max wheelchair distance: 150'    Wheelchair 50 feet with 2 turns activity    Assist        Assist Level: Supervision/Verbal cueing   Wheelchair 150 feet activity     Assist      Assist Level: Supervision/Verbal cueing   Blood pressure 139/76, pulse (!) 53, temperature 98 F (36.7 C), temperature source Oral, resp. rate 18, height 6' (1.829 m), weight 66.2 kg, SpO2 100 %.  Medical Problem List and Plan: 1.Impaired mobility/ADLs/Functionsecondary to Cauda equina syndrome, L shoulder dislocation s/p repair (very unstable L shoulder) NWB x 6 weeks, and L trimalleolar fx s/p ORIF NWB 6+ weeks with neurogenic  bowel and bladder. 2. Antithrombotics: -DVT/anticoagulation:Pharmaceutical:Lovenox -antiplatelet therapy: N/A 3. Pain Management: Post op pain and SCI AT LEVEL pain On tylenol 1000 mg qid, ultram 25 mg qid, robaxin 500 mg qid, Celebrex bid and Oxycodone prn.Will start Gabapentin for nerve pain  11/12- will increase Gabapentin to 400 mg TID for nerve pain.  4. Mood:LCSW to follow for evaluation and support -antipsychotic agents: N/A 5. Neuropsych: This patientiscapable of making decisions onhisown behalf. 6. Skin/Wound Care:Routine pressure relief measures. 7. Fluids/Electrolytes/Nutrition:Monitor I/O. Check lytes in am.  8. Left shoulder dislocation s/p arthroscopy: Sling with NWB X 5 more weeks. 9. ORIF Left trimalleolar ankle fracture: NWB LLE 10 ABLA: Continue to monitor.Recheck in am.  11/11- Hb 12 11. Neurogenic bowel:Will check KUB. Discontinue Celebrex as reports of abdominal pain--has lot of neuropathic pain. Surgery to start gabapentin. Change Senna S to plain senna. Continue miralax. Add Anusol HC cream for hems.Will start Bowel program once gets to inpt rehab in evening- with dig stimulation- went over with pt and wife need for dig stim to full evacuate bowels.  11/12- got suppository last night, not bowel program- will verify to  be done. Might need rectal clear in AM as well, due to cauda equina syndrome. 12. Neurogenic bladder:Continue foley for now--voiding trial once bowel program established. Will not be able to self cath with LUE out of commission.Check UA/UCS.Once gets to rehab, will start Flomax, and then get foley out, if possible, to see if gets return- if not, then will decide at that time (due to LUE NWB) if Louis Jordan wants foley or in/out cathing. Will also d/c oxybutynin since not having bladder spasms  11/11- started flomax 0.4 mg nightly and bowel program  11/12- will d/c foley in AM and do in/out caths based on  volumes/PVR/bladder scans- have placed orders 13. Steroid induced hyperglycemia:Question timing of weaning.     LOS: 2 days A FACE TO FACE EVALUATION WAS PERFORMED  Louis Jordan 10/27/2019, 9:05 AM

## 2019-10-27 NOTE — Progress Notes (Signed)
Physical Therapy Session Note  Patient Details  Name: Louis Jordan MRN: [id number] Date of Birth: 03/13/1956  Today's Date: 10/27/2019 PT Individual Time: 1415-1530 PT Individual Time Calculation (min): 75 min   Short Term Goals: Week 1:  PT Short Term Goal 1 (Week 1): Pt will complete least restrictive transfer with min A PT Short Term Goal 2 (Week 1): Pt will initiate gait training PT Short Term Goal 3 (Week 1): Pt will tolerate sitting up out of bed x 2 hours  Skilled Therapeutic Interventions/Progress Updates:    Pt received sidelying in bed asleep, arousable and agreeable to PT session. Pt reports 5/10 pain in low back at rest, RN aware and able to provide pain medication at end of session. Pt is dependent to don LSO while in supine and R sidelying. Supine to sit with min A, increased independence with transfer this date. Squat pivot transfer bed to w/c with mod A. Manual w/c propulsion x 150 ft with use of R UE/LE and Supervision. Pt requires assist for management of w/c parts due to mobility restrictions. Squat pivot transfer w/c to mat table with mod A, v/c for head/hips relationship. Sit to stand with mod A to hemiwalker with fair ability to maintain WBing precautions. Pt tends to lean to the R in stance on RLE and reports some hip soreness with this. Session focus on standing safely to hemiwalker and performing stand pivot transfers with hemiwalker. Pt progresses from mod A to min A for SPT with HW going both L and R. Pt requests to return to bed at end of session. Squat pivot transfer w/c to bed with min A. Pt is dependent to remove tank top and new shirt wife has provided. Sit to supine mod A for BLE management. Pt left supine in bed with needs in reach at end of session, wife present.  Therapy Documentation Precautions:  Precautions Precautions: Fall, Back Precaution Comments: NWB LUE in sling Required Braces or Orthoses: Sling, Spinal Brace Spinal Brace: Applied in sitting  position(pt prefers to don in supine) Spinal Brace Comments: custom LSO Restrictions Weight Bearing Restrictions: Yes LUE Weight Bearing: Non weight bearing LLE Weight Bearing: Non weight bearing    Therapy/Group: Individual Therapy   Excell Seltzer, PT, DPT  10/27/2019, 3:43 PM

## 2019-10-28 ENCOUNTER — Inpatient Hospital Stay (HOSPITAL_COMMUNITY): Payer: Commercial Managed Care - PPO

## 2019-10-28 ENCOUNTER — Inpatient Hospital Stay (HOSPITAL_COMMUNITY): Payer: Commercial Managed Care - PPO | Admitting: Occupational Therapy

## 2019-10-28 ENCOUNTER — Inpatient Hospital Stay (HOSPITAL_COMMUNITY): Payer: Commercial Managed Care - PPO | Admitting: *Deleted

## 2019-10-28 ENCOUNTER — Inpatient Hospital Stay (HOSPITAL_COMMUNITY): Payer: Commercial Managed Care - PPO | Admitting: Physical Therapy

## 2019-10-28 LAB — URINE CULTURE: Culture: >=100000 — AB

## 2019-10-28 MED ORDER — OXYCODONE HCL ER 10 MG PO T12A: 10.0000 mg | EXTENDED_RELEASE_TABLET | Freq: Two times a day (BID) | ORAL | Status: DC

## 2019-10-28 MED ORDER — LIDOCAINE HCL URETHRAL/MUCOSAL 2 % EX GEL
1.0000 "application " | CUTANEOUS | Status: DC | PRN
  Administered 2019-11-07 – 2019-11-08 (×2): 1 via URETHRAL
  Filled 2019-10-28 (×4): qty 5
  Filled 2019-10-28: qty 10
  Filled 2019-10-28 (×14): qty 5

## 2019-10-28 MED ORDER — OXYCODONE HCL ER 10 MG PO T12A
10.0000 mg | EXTENDED_RELEASE_TABLET | Freq: Two times a day (BID) | ORAL | Status: DC
  Administered 2019-10-28 – 2019-11-08 (×23): 10 mg via ORAL
  Filled 2019-10-28 (×23): qty 1

## 2019-10-28 NOTE — Progress Notes (Signed)
Physical Therapy Session Note  Patient Details  Name: Louis Jordan MRN: [id number] Date of Birth: [birth year]  Today's Date: 10/28/2019 PT Individual Time: 5894-8347 PT Individual Time Calculation (min): 30 min   Short Term Goals: Week 1:  PT Short Term Goal 1 (Week 1): Pt will complete least restrictive transfer with min A PT Short Term Goal 2 (Week 1): Pt will initiate gait training PT Short Term Goal 3 (Week 1): Pt will tolerate sitting up out of bed x 2 hours  Skilled Therapeutic Interventions/Progress Updates:   Pt received sitting in WC and agreeable to PT. PT instructed pt in WC mobility over level and unlevel surface of cement side walk and entrance to [facility], to simulate community environment. Supervision fading to min assist with fatigue on uphill grade. Cues for safety, technique and NWB precautions. Pt returned to room and performed stand pivot transfer to bed with min assist and moderate cues for NWB in the LLE. PT doffed TLSO sitting EOB. Sit>supine completed with min and left supine in bed with call bell in reach and all needs met.        Therapy Documentation Precautions:  Precautions Precautions: Fall, Back Precaution Comments: NWB LUE in sling Required Braces or Orthoses: Sling, Spinal Brace Spinal Brace: Applied in sitting position(pt prefers to don in supine) Spinal Brace Comments: custom LSO Restrictions Weight Bearing Restrictions: Yes LUE Weight Bearing: Non weight bearing LLE Weight Bearing: Non weight bearing Vital Signs: Therapy Vitals Temp: 99.4 F (37.4 C) Pulse Rate: 78 Resp: 18 BP: (!) 157/77 Patient Position (if appropriate): Lying Oxygen Therapy SpO2: (!) 87 % O2 Device: Room Air Pain: Denies at rest.    Therapy/Group: Individual Therapy  Lorie Phenix 10/28/2019, 4:42 PM

## 2019-10-28 NOTE — Progress Notes (Signed)
Occupational Therapy Session Note  Patient Details  Name: Louis Jordan MRN: [id number] Date of Birth: 02-27-1956  Today's Date: 10/28/2019 OT Individual Time: 1115-1200 OT Individual Time Calculation (min): 45 min    Short Term Goals: Week 1:  OT Short Term Goal 1 (Week 1): Pt will instruct caregiver in donning shirt with min cueing OT Short Term Goal 2 (Week 1): Pt will don pants with LRAD with min A OT Short Term Goal 3 (Week 1): Pt will complete toileting tasks with min A  Skilled Therapeutic Interventions/Progress Updates:    1:1 Pt reports not feeling as well today due to maybe a new medicine. Functional problem solving about showering when ROM allowed in left shoulder and sling is allowed off. Pt is currently 1.5 weeks out of 5 before discussion about sling coming off. Anticipate pt may not be able to fully weight bear so tub bench is recommended for shower stall entry/ exit. Situation set up and performed with hemi walker and stand pivot transfers with mod A (for balance and to maintain weight bearing status). Pt returned to bed with min A with extra time and cues for sequencing.   Therapy Documentation Precautions:  Pain:  ongoing pain - will get meds at end of session today   Therapy/Group: Individual Therapy  Willeen [facility] 10/28/2019, 1:14 PM

## 2019-10-28 NOTE — Progress Notes (Signed)
Physical Therapy Session Note  Patient Details  Name: Louis Jordan MRN: [id number] Date of Birth: July 17, 1956  Today's Date: 10/28/2019 PT Individual Time: 0800-0900; 1415-1530 PT Individual Time Calculation (min): 60 min and 75 min  Short Term Goals: Week 1:  PT Short Term Goal 1 (Week 1): Pt will complete least restrictive transfer with min A PT Short Term Goal 2 (Week 1): Pt will initiate gait training PT Short Term Goal 3 (Week 1): Pt will tolerate sitting up out of bed x 2 hours  Skilled Therapeutic Interventions/Progress Updates:    Session 1: Pt received sidelying in bed, agreeable to PT session. Pt reports 8/10 pain in low back, premedicated prior to start of therapy session. Rolling to the R with CGA for dependent placement of LSO. Supine to sit via logroll with mod A for LLE management and trunk control. Dependent to don sock and shoe while seated EOB. Squat pivot transfer bed to w/c with mod A. Pt is setup A for oral hygiene and washing face while seated in w/c at sink. Manual w/c propulsion x 150 ft with Supervision, requires assist for L brake and leg rest management 2/2 precautions. Car transfer via SPT with hemiwalker and mod A for balance and safety, v/c for WBing precautions. Pt is able to get BLE in/out of car with Supervision cueing assist. Pt left seated in w/c in room with needs in reach at end of session. Pt reports back pain well controlled by pain medication by end of session.  Session 2: Pt received sidelying in bed, agreeable to PT session. No complaints of pain. Bed mobility Supervision. Set pt up with more narrow 16x16 w/c for improved fit. Squat pivot transfer bed to w/c with min A. Pt is at Supervision to mod I level for w/c mobility, requires assist for L brake management and leg rest. Squat pivot transfer mat table to/from w/c with min A. Sit to supine with Supervision. Supine and sidelying BLE therex x 30 reps each: heel slides, hip abd (supine and sidelying), SAQ,  clamshells with 4# ankle weight on RLE. Seated BLE LAQ x 30 reps each with 4# ankle weight on RLE. Pt left seated in w/c in room with needs in reach at end of session.   Therapy Documentation Precautions:  Precautions Precautions: Fall, Back Precaution Comments: NWB LUE in sling Required Braces or Orthoses: Sling, Spinal Brace Spinal Brace: Applied in sitting position(pt prefers to don in supine) Spinal Brace Comments: custom LSO Restrictions Weight Bearing Restrictions: Yes LUE Weight Bearing: Non weight bearing LLE Weight Bearing: Non weight bearing    Therapy/Group: Individual Therapy   Excell Seltzer, PT, DPT  10/28/2019, 12:42 PM

## 2019-10-28 NOTE — IPOC Note (Signed)
Overall Plan of Care [facility]) Patient Details Name: Louis Jordan MRN: [id number] DOB: 1956-04-20  Admitting Diagnosis: Cauda equina syndrome [facility])  Hospital Problems: Principal Problem:   Cauda equina syndrome ([place]) Active Problems:   Bankart variant lesion of left shoulder   Closed displaced trimalleolar fracture of left ankle   Closed burst fracture of lumbar vertebra (HCC)   Spinal cord injury, thoracic (T7-T12) (HCC)   Neurogenic bladder   Neurogenic bowel     Functional Problem List: Nursing Bladder, Bowel, Endurance, Medication Management, Pain  PT Balance, Endurance, Pain, Safety, Sensory  OT Balance, Safety, Sensory, Skin Integrity, Endurance, Motor, Pain  SLP    TR         Basic ADL's: OT Bathing, Dressing, Toileting     Advanced  ADL's: OT       Transfers: PT Bed Mobility, Bed to Chair, Car, Furniture, Floor  OT Toilet, Metallurgist: PT Ambulation, Emergency planning/management officer, Stairs     Additional Impairments: OT None  SLP        TR      Anticipated Outcomes Item Anticipated Outcome  Self Feeding no goal set  Swallowing      Basic self-care  (S)  Toileting  (S)   Bathroom Transfers (S)  Bowel/Bladder  management of bowel and bladder with minimal assist  Transfers  Supervision  Locomotion  Supervision at w/c level  Communication     Cognition     Pain  Pain level at or below 3/10  Safety/Judgment  no falls with injury   Therapy Plan: PT Intensity: Minimum of 1-2 x/day ,45 to 90 minutes PT Frequency: 5 out of 7 days PT Duration Estimated Length of Stay: 12-14 days OT Intensity: Minimum of 1-2 x/day, 45 to 90 minutes OT Frequency: 5 out of 7 days OT Duration/Estimated Length of Stay: 14-18 days     Due to the current state of emergency, patients may not be receiving their 3-hours of Medicare-mandated therapy.   Team Interventions: Nursing Interventions Patient/Family Education, Bladder Management, Bowel Management, Disease  Management/Prevention, Skin Care/Wound Management, Pain Management, Medication Management, Discharge Planning, Psychosocial Support  PT interventions Ambulation/gait training, Training and development officer, Community reintegration, Discharge planning, DME/adaptive equipment instruction, Functional mobility training, Pain management, Patient/family education, Psychosocial support, Stair training, Therapeutic Activities, Therapeutic Exercise, UE/LE Strength taining/ROM, UE/LE Coordination activities, Wheelchair propulsion/positioning  OT Interventions Balance/vestibular training, Discharge planning, Pain management, Self Care/advanced ADL retraining, Therapeutic Activities, UE/LE Coordination activities, Wheelchair propulsion/positioning, UE/LE Strength taining/ROM, Splinting/orthotics, Psychosocial support, DME/adaptive equipment instruction, Community reintegration, Disease mangement/prevention, Functional mobility training, Patient/family education, Therapeutic Exercise  SLP Interventions    TR Interventions    SW/CM Interventions     Barriers to Discharge MD  Medical stability, Home enviroment access/loayout, Incontinence, Neurogenic bowel and bladder, Lack of/limited family support, Weight bearing restrictions and cauda equina syndrome  Nursing Incontinence, Neurogenic Bowel & Bladder    PT Medical stability, Home environment access/layout, Incontinence, Weight bearing restrictions    OT      SLP      SW       Team Discharge Planning: Destination: PT-Home ,OT- Home , SLP-  Projected Follow-up: PT-Home health PT, OT-  Outpatient OT, SLP-  Projected Equipment Needs: PT-Wheelchair (measurements), Wheelchair cushion (measurements), To be determined, OT- Tub/shower seat, SLP-  Equipment Details: PT-TBD pending progress, OT-  Patient/family involved in discharge planning: PT- Patient,  OT-Patient, SLP-   MD ELOS: 14-18 days Medical Rehab Prognosis:  Good Assessment: Pt is a 63 yr  old male  with new cauda equina syndrome from L1 burst fx, L trimalleolar fx NWB and L shoulder dislocation that's also NWB and needs to wear sling all the time- here for CIR_ he's having neurogenic bowel and bladder with bowel incontinence- had good response to dig stim; also getting foley out today to see if gets bladder return/ on flomax- pain is also a large issue- starting oxycontin 10 mg BID today since has hallucinations with morphine.  Goals- supervision to min assist    See Team Conference Notes for weekly updates to the plan of care

## 2019-10-28 NOTE — Evaluation (Signed)
Recreational Therapy Assessment and Plan  Patient Details  Name: Louis Jordan MRN: [id number] Date of Birth: 06-Aug-1956 Today's Date: 10/28/2019  Rehab Potential: Excellent ELOS: 2 weeks   AssessmentProblem List:      Patient Active Problem List   Diagnosis Date Noted  . Spinal cord injury, thoracic (T7-T12) ([place]) 10/25/2019  . Shoulder dislocation, left, initial encounter   . Closed burst fracture of lumbar vertebra ([place])   . Cauda equina spinal cord injury ([place])   . Closed fracture of left ankle   . Fall from ladder 10/22/2019  . 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 Closed displaced trimalleolar fracture of left ankle 10/22/2019  . S/P lumbar fusion 10/16/2019  . Fracture of lumbar spine without lesion of spinal cord ([place]) 10/15/2019    Past Medical History:      Past Medical History:  Diagnosis Date  . Bundle branch block, right   . Complication of anesthesia   . Hand pain    mild OA bilateral  hands  . Left knee injury 1970   requiring surgery for repair of ligaments/cartilage damage with nerve injury that took 20 years to resolve  . MVP (mitral valve prolapse)   . PONV (postoperative nausea and vomiting)    Past Surgical History:       Past Surgical History:  Procedure Laterality Date  . APPENDECTOMY    . KNEE SURGERY Right   . LUMBAR SPINE SURGERY  10/16/2019   L1 burst fracture with epidural hematoma and canal stenosis  . OPEN REDUCTION INTERNAL FIXATION (ORIF) TIBIA/FIBULA FRACTURE Left 10/19/2019   Procedure: OPEN REDUCTION INTERNAL FIXATION (ORIF) TIBIA/FIBULA FRACTURE;  Surgeon: Shona Needles, MD;  Location: [place];  Service: Orthopedics;  Laterality: Left;  . SHOULDER ARTHROSCOPY WITH BANKART REPAIR Left 10/19/2019   Procedure: SHOULDER ARTHROSCOPY WITH BANKART REPAIR, EXTENSIVE DEBRIDEMENT, LOOSE BODY REMOVAL;  Surgeon: Hiram Gash, MD;  Location: [place];  Service: Orthopedics;  Laterality: Left;  . STRABISMUS SURGERY     . TONSILLECTOMY      Assessment & Plan Clinical Impression:  Louis Jordan is a 32 year oldR handedmale with who fell 12 feet off a ladder on 10/15/19 with subsequent L1 burst fracture with retropulsion, left anterior shoulder dislocation and left trimalleolar ankle fracture. Shoulder reduced by trauma with sling recommended and left ankle splinted. He was taken to OR for ORIF L1 fracture with decompressive laminectomy with removal of epidural hematoma, medial facetectomy and foraminotomy with posterior fixation T10- L4 and intertransverse arthrodesis of L4 by Dr. Ronnald Ramp. Noted to have perineal numbness due to conus injury with neurogenic bowel and bladder. He did have repeat dislocation of left shoulder requiring reduction 11/2 and Dr. Griffin Basil consulted who felt that patient with Bankart injury --arthroscopic stabilization recommended. He was taken to OR 11/4 for arthroscopy with removal of loose bodies and labral repair and capsulorrhaphy as well as ORIF left trimalleolar ankle fracture by Dr. Doreatha Martin. Post op NWB LLE and NWB LUE for 6 weeks with gentle stretching of elbow due to highly unstable shoulder.   Custom LSO ordered for back support as sling inhibits TLSO use. Foley in place due to urinary retention. He continues on decadron 4 mg qid with elevated fasting BS. He continues to have saddle anesthesia and received SMOG enema 11/09 and on dulcolax suppositories bid.He reports saddle anesthesia with abdominal pain, LLE > back pain that is constant. Is tolerating increase in activity but does get nauseous with fatigue. 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 CIR recommended due to functional decline. Patient transferred to CIR on 10/25/2019 .   Pt presents with decreased activity tolerance, decreased functional mobility, decreased balance and difficulty maintaining precautions Limiting pt's independence with leisure/community pursuits.   Leisure  History/Participation Premorbid leisure interest/current participation: [employer] store;Community - Travel (Comment);Sports - Exercise (Comment);Crafts - Other (Comment);[place] - Fishing;Nature - [place] care(restoring old cars, bodybuilding) Leisure Participation Style: With Family/Friends;Alone Awareness of Community Resources: Excellent Psychosocial / Spiritual Spiritual Interests: [place] interaction - Mood/Behavior: Cooperative Academic librarian Appropriate for Education?: Yes Strengths/Weaknesses Patient Strengths/Abilities: Willingness to participate;Active premorbidly Patient weaknesses: Physical limitations TR Patient demonstrates impairments in the following area(s): Endurance;Motor;Pain;Skin Integrity  Plan Rec Therapy Plan Is patient appropriate for Therapeutic Recreation?: Yes Rehab Potential: Excellent Treatment times per week: Min 1 TR session >20 minutes Estimated Length of Stay: 2 weeks TR Treatment/Interventions: Adaptive equipment instruction;Community reintegration;Patient/family education;Therapeutic exercise;1:1 session;Functional mobility training;Balance/vestibular training;Recreation/leisure participation;Therapeutic activities;Wheelchair propulsion/positioning  Recommendations for other services: None   Discharge Criteria: Patient will be discharged from TR if patient refuses treatment 3 consecutive times without medical reason.  If treatment goals not met, if there is a change in medical status, if patient makes no progress towards goals or if patient is discharged from hospital.  The above assessment, treatment plan, treatment alternatives and goals were discussed and mutually agreed upon: by patient  [place] 10/28/2019, 12:29 PM

## 2019-10-28 NOTE — Progress Notes (Signed)
[facility] PHYSICAL MEDICINE & REHABILITATION PROGRESS NOTE   Subjective/Complaints:  Reports nerve pain has already gotten somewhat better in legs- BMs aren't really formed, but according to nurse, had great response to .dig stim last night. Takes ~75% of meals- only soft food and taking 75% of miralax.  Lower back is his main pain along with lower abdomen- if takes pain meds, will last 3-3 1/2 hours, then pain back at 10/10 and can't barely stand it.  Says is usually someone keeping 50 balls in air and can do it, but having difficulty keeping anything going due to pain.   ROS- denies CP, SOB, N/V/D/C    Objective:   No results found. Recent Labs    10/26/19 0456  WBC 10.6*  HGB 12.0*  HCT 35.3*  PLT 433*   Recent Labs    10/26/19 0456  NA 135  K 4.3  CL 98  CO2 28  GLUCOSE 114*  BUN 23  CREATININE 0.84  CALCIUM 8.6*    Intake/Output Summary (Last 24 hours) at 10/28/2019 0829 Last data filed at 10/28/2019 9767 Gross per 24 hour  Intake 662 ml  Output 1700 ml  Net -1038 ml     Physical Exam: Vital Signs Blood pressure 129/74, pulse 62, temperature 98.7 F (37.1 C), temperature source Oral, resp. rate 16, height 6' (1.829 m), weight 66.2 kg, SpO2 96 %. Physical Exam Nursing noteand labs and vitalsreviewed. Constitutional:  More depressed affect; lying supine in bed; wearing sling for L shoulder, NAD; nurse in room HENT:  Head:Normocephalicand atraumatic. Eyes:Pupils are equal, round, and reactive to light.EOMare normal. Neck:Normal range of motion.Neck supple.  Cardiovascular: RRR- no M/R/G Respiratory: CTA B/L- no W/R/R HA:LPFX. NT, ND, (+)BS Genitourinary: Genitourinary Comments: Foley in place. External hemorrhoids noted- improving. Musculoskeletal:  Comments: Left shoulder incision C/D/I- looks great. Splint in place left ankle- neurovascularly intact distal to splint/cast.   RUE biceps, triceps, WE, grip and finger bad  5/5 LUE- can only test grip/finger abd which were 5/5 RLE- HF 5-/5, KE 5-/5, DF 5-/5, PF 5-/5, EHL 4/5 LLE- HF 5-/5, KE 5-/5- EHL 4/5; couldn't test at ankle due to fx/cast Neurological: He is alertand oriented to person, place, and time. Intact sensation C1-T12 B/L RLE- Decreased sensation at L1 on R; L2/L3 nml, L4- S2 decreased on RLE LLE- L1, L2, L3 nml sensation; cannot test L4/L5 due to cast; S1/S2 decreased on LLE Buttocks- S3 decreased, S4 really decreased and S5 absent B/L Skin: He isnot diaphoretic. Back and hip incision C/D/I with steri-strips in place. No erythema, no drainage; look good. No other skin breakdown seen. Psychiatric:  Slightly depressed/flat affect, but polite/cordial still     Assessment/Plan: 1. 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  Physiatrist is providing close team supervision and 24 hour management of active medical problems listed below.  Physiatrist and rehab team continue to assess barriers to discharge/monitor patient progress toward functional and medical goals  Care Tool:  Bathing  Bathing activity did not occur: Safety/medical concerns Body parts bathed by patient: Face   Body parts bathed by helper: Buttocks(back)     Bathing assist Assist Level: Minimal Assistance - Patient > 75%     Upper Body Dressing/Undressing Upper body dressing   What is the patient wearing?: Pull over shirt(that is button  on the sides)    Upper body assist Assist Level: Moderate Assistance - Patient 50 - 74%    Lower Body Dressing/Undressing Lower body dressing      What is the patient wearing?: Underwear/pull up(short, ace wrap/soft cast to left leg lower leg)     Lower body assist Assist for lower body dressing: Moderate  Assistance - Patient 50 - 74%     Toileting Toileting    Toileting assist Assist for toileting: 2 Helpers     Transfers Chair/bed transfer  Transfers assist     Chair/bed transfer assist level: Moderate Assistance - Patient 50 - 74%     Locomotion Ambulation   Ambulation assist   Ambulation activity did not occur: Safety/medical concerns          Walk 10 feet activity   Assist  Walk 10 feet activity did not occur: Safety/medical concerns        Walk 50 feet activity   Assist Walk 50 feet with 2 turns activity did not occur: Safety/medical concerns         Walk 150 feet activity   Assist Walk 150 feet activity did not occur: Safety/medical concerns         Walk 10 feet on uneven surface  activity   Assist Walk 10 feet on uneven surfaces activity did not occur: Safety/medical concerns         Wheelchair     Assist Will patient use wheelchair at discharge?: Yes Type of Wheelchair: Manual    Wheelchair assist level: Supervision/Verbal cueing Max wheelchair distance: 150'    Wheelchair 50 feet with 2 turns activity    Assist        Assist Level: Supervision/Verbal cueing   Wheelchair 150 feet activity     Assist      Assist Level: Supervision/Verbal cueing   Blood pressure 129/74, pulse 62, temperature 98.7 F (37.1 C), temperature source Oral, resp. rate 16, height 6' (1.829 m), weight 66.2 kg, SpO2 96 %.  Medical Problem List and Plan: 1.Impaired mobility/ADLs/Functionsecondary to Cauda equina syndrome, L shoulder dislocation s/p repair (very unstable L shoulder) NWB x 6 weeks, and L trimalleolar fx s/p ORIF NWB 6+ weeks with neurogenic bowel and bladder. 2. Antithrombotics: -DVT/anticoagulation:Pharmaceutical:Lovenox -antiplatelet therapy: N/A 3. Pain Management: Post op pain and SCI AT LEVEL pain On tylenol 1000 mg qid, ultram 25 mg qid, robaxin 500 mg qid, Celebrex bid and Oxycodone  prn.Will start Gabapentin for nerve pain  11/12- will increase Gabapentin to 400 mg TID for nerve pain.  11/13- will add Oxycontin 10 mg BID for pain  4. Mood:LCSW to follow for evaluation and support -antipsychotic agents: N/A 5. Neuropsych: This patientiscapable of making decisions onhisown behalf. 6. Skin/Wound Care:Routine pressure relief measures. 7. Fluids/Electrolytes/Nutrition:Monitor I/O. Check lytes in am.  8. Left shoulder dislocation s/p arthroscopy: Sling with NWB X 5 more weeks. 9. ORIF Left trimalleolar ankle fracture: NWB LLE 10 ABLA: Continue to monitor.Recheck in am.  11/11- Hb 12 11. Neurogenic bowel:Will check KUB. Discontinue Celebrex as reports of abdominal pain--has lot of neuropathic pain. Surgery to start gabapentin. Change Senna S to plain senna. Continue miralax. Add Anusol HC cream for hems.Will start Bowel program once gets to inpt rehab in evening- with dig stimulation- went over with pt and wife need for dig stim to full evacuate bowels.  11/12- got suppository last night, not bowel program- will verify to be done. Might need rectal clear in AM as well, due to cauda equina  syndrome.  11/13- had good response to dig stim- will con't- might need rectal clear in AM- d/w Cheri GuppyJennifer Bailey who said we could order if needed. 12. Neurogenic bladder:Continue foley for now--voiding trial once bowel program established. Will not be able to self cath with LUE out of commission.Check UA/UCS.Once gets to rehab, will start Flomax, and then get foley out, if possible, to see if gets return- if not, then will decide at that time (due to LUE NWB) if he wants foley or in/out cathing. Will also d/c oxybutynin since not having bladder spasms  11/11- started flomax 0.4 mg nightly and bowel program  11/12- will d/c foley in AM and do in/out caths based on volumes/PVR/bladder scans- have placed orders  11/13- foley out this AM- ordered lidocaine for cathing.   13. Steroid induced hyperglycemia:Question timing of weaning.     LOS: 3 days A FACE TO FACE EVALUATION WAS PERFORMED  Louis Jordan 10/28/2019, 8:29 AM

## 2019-10-29 ENCOUNTER — Inpatient Hospital Stay (HOSPITAL_COMMUNITY): Payer: Commercial Managed Care - PPO

## 2019-10-29 ENCOUNTER — Inpatient Hospital Stay (HOSPITAL_COMMUNITY): Payer: Commercial Managed Care - PPO | Admitting: Physical Therapy

## 2019-10-29 DIAGNOSIS — M792 Neuralgia and neuritis, unspecified: Secondary | ICD-10-CM

## 2019-10-29 DIAGNOSIS — G8918 Other acute postprocedural pain: Secondary | ICD-10-CM | POA: Insufficient documentation

## 2019-10-29 DIAGNOSIS — N390 Urinary tract infection, site not specified: Secondary | ICD-10-CM

## 2019-10-29 DIAGNOSIS — E46 Unspecified protein-calorie malnutrition: Secondary | ICD-10-CM

## 2019-10-29 DIAGNOSIS — S43492S Other sprain of left shoulder joint, sequela: Secondary | ICD-10-CM

## 2019-10-29 DIAGNOSIS — N319 Neuromuscular dysfunction of bladder, unspecified: Secondary | ICD-10-CM

## 2019-10-29 DIAGNOSIS — K592 Neurogenic bowel, not elsewhere classified: Secondary | ICD-10-CM

## 2019-10-29 DIAGNOSIS — E8809 Other disorders of plasma-protein metabolism, not elsewhere classified: Secondary | ICD-10-CM

## 2019-10-29 DIAGNOSIS — S32001S Stable burst fracture of unspecified lumbar vertebra, sequela: Secondary | ICD-10-CM

## 2019-10-29 DIAGNOSIS — D62 Acute posthemorrhagic anemia: Secondary | ICD-10-CM

## 2019-10-29 MED ORDER — SODIUM CHLORIDE 0.9 % IV SOLN
2.0000 g | Freq: Two times a day (BID) | INTRAVENOUS | Status: DC
  Administered 2019-10-29 – 2019-11-04 (×14): 2 g via INTRAVENOUS
  Filled 2019-10-29 (×16): qty 2

## 2019-10-29 MED ORDER — PRO-STAT SUGAR FREE PO LIQD
30.0000 mL | Freq: Two times a day (BID) | ORAL | Status: DC
  Administered 2019-10-29 – 2019-11-08 (×21): 30 mL via ORAL
  Filled 2019-10-29 (×21): qty 30

## 2019-10-29 MED ORDER — TAMSULOSIN HCL 0.4 MG PO CAPS
0.8000 mg | ORAL_CAPSULE | Freq: Every day | ORAL | Status: DC
  Administered 2019-10-29 – 2019-11-07 (×10): 0.8 mg via ORAL
  Filled 2019-10-29 (×12): qty 2

## 2019-10-29 NOTE — Progress Notes (Signed)
Physical Therapy Session Note  Patient Details  Name: Louis Jordan MRN: [id number] Date of Birth: 18-Nov-1956  Today's Date: 10/29/2019 PT Individual Time: 1615-1700 PT Individual Time Calculation (min): 45 min   Short Term Goals: Week 1:  PT Short Term Goal 1 (Week 1): Pt will complete least restrictive transfer with min A PT Short Term Goal 2 (Week 1): Pt will initiate gait training PT Short Term Goal 3 (Week 1): Pt will tolerate sitting up out of bed x 2 hours  Skilled Therapeutic Interventions/Progress Updates:    Pt seated in w/c upon PT arrival, agreeable to therapy tx and reports pain 4/10 in low back, repositioned at end of session for pain management. Pt propelled w/c to the gym with supervision using R UE/R LE. Pt performed squat pivot transfer to the R with CGA and cues for techniques. Pt performed 2 x 5 sit<>stands from mat with R UE on HW and pushing up with R LE working on strengthening, pt able to maintain L LE NWB in standing. Pt asks about his 1 STE at home, he reports that his wife would not be able to bump him up in w/c given her PMH, discussed getting a ramp. In standing pt performed L LE SLR and marches for strengthening x 10 each. Pt seated edge of mat performed 2 x 10 shoulder press with R UE and 5# dumbbell. Pt performed stand pivot back to w/c with min assist, cues for techniques/safety. Pt propelled w/c back to room, stand pivot to bed min assist. Once seated EOB therapist assisted pt to doff back brace, sit>supine with supervision. Pt left supine in bed with needs in reach and bed alarm set.    Therapy Documentation Precautions:  Precautions Precautions: Fall, Back Precaution Comments: NWB LUE in sling Required Braces or Orthoses: Sling, Spinal Brace Spinal Brace: Applied in sitting position(pt prefers to don in supine) Spinal Brace Comments: custom LSO Restrictions Weight Bearing Restrictions: Yes LUE Weight Bearing: Non weight bearing LLE Weight Bearing: Non weight  bearing    Therapy/Group: Individual Therapy  Netta Corrigan, PT, DPT, CSRS 10/29/2019, 12:21 PM

## 2019-10-29 NOTE — Progress Notes (Signed)
[facility] PHYSICAL MEDICINE & REHABILITATION PROGRESS NOTE   Subjective/Complaints: Patient seen laying in bed this morning.  He states he did not sleep well overnight due to issues with catheterization.  Discussed with nursing use of coud cath as well as increase in medications.  Discussed bladder management with patient.  ROS: + Urinary retention.  Denies CP, SOB, N/V/D/C  Objective:   No results found. No results for input(s): WBC, HGB, HCT, PLT in the last 72 hours. No results for input(s): NA, K, CL, CO2, GLUCOSE, BUN, CREATININE, CALCIUM in the last 72 hours.  Intake/Output Summary (Last 24 hours) at 10/29/2019 0938 Last data filed at 10/29/2019 0841 Gross per 24 hour  Intake 200 ml  Output 1650 ml  Net -1450 ml     Physical Exam: Vital Signs Blood pressure 124/71, pulse (!) 57, temperature 98.2 F (36.8 C), resp. rate 14, height 6' (1.829 m), weight 62.4 kg, SpO2 99 %.  Constitutional: No distress . Vital signs reviewed. HENT: Normocephalic.  Atraumatic. Eyes: EOMI. No discharge. Cardiovascular: No JVD. Respiratory: Normal effort.  No stridor. GI: Non-distended. Skin: Left ankle with dressing C/D/I Psych: Normal mood.  Normal behavior. Musc: Left ankle with edema and tenderness Neuro: Alert Right upper extremities 5/5 proximal distal Left upper extremity: Hand grip 5/5 Right lower extremity: 5/5 proximal distal Left lower extremity: Hip flexion, knee extension 4+/5, ankle braced  Assessment/Plan: 1. 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  Physiatrist is providing close team supervision and 24 hour management of active medical problems listed below.  Physiatrist and rehab team continue to assess barriers to discharge/monitor patient progress  toward functional and medical goals  Care Tool:  Bathing  Bathing activity did not occur: Safety/medical concerns Body parts bathed by patient: Face   Body parts bathed by helper: Buttocks(back)     Bathing assist Assist Level: Minimal Assistance - Patient > 75%     Upper Body Dressing/Undressing Upper body dressing   What is the patient wearing?: Pull over shirt(that is button on the sides)    Upper body assist Assist Level: Moderate Assistance - Patient 50 - 74%    Lower Body Dressing/Undressing Lower body dressing      What is the patient wearing?: Underwear/pull up(short, ace wrap/soft cast to left leg lower leg)     Lower body assist Assist for lower body dressing: Moderate Assistance - Patient 50 - 74%     Toileting Toileting    Toileting assist Assist for toileting: 2 Helpers     Transfers Chair/bed transfer  Transfers assist     Chair/bed transfer assist level: Minimal Assistance - Patient > 75%     Locomotion Ambulation   Ambulation assist   Ambulation activity did not occur: Safety/medical concerns          Walk 10 feet activity   Assist  Walk 10 feet activity did not occur: Safety/medical concerns        Walk 50 feet activity   Assist Walk 50 feet with 2 turns activity did not occur: Safety/medical concerns         Walk 150 feet activity   Assist Walk 150 feet activity did not occur: Safety/medical concerns         Walk 10 feet on uneven surface  activity   Assist  Walk 10 feet on uneven surfaces activity did not occur: Safety/medical concerns         Wheelchair     Assist Will patient use wheelchair at discharge?: Yes Type of Wheelchair: Manual    Wheelchair assist level: Supervision/Verbal cueing Max wheelchair distance: 150'    Wheelchair 50 feet with 2 turns activity    Assist        Assist Level: Supervision/Verbal cueing   Wheelchair 150 feet activity     Assist      Assist  Level: Supervision/Verbal cueing   Blood pressure 124/71, pulse (!) 57, temperature 98.2 F (36.8 C), resp. rate 14, height 6' (1.829 m), weight 62.4 kg, SpO2 99 %.  Medical Problem List and Plan: 1.Impaired mobility/ADLs/Functionsecondary to Cauda equina syndrome, L shoulder dislocation s/p repair (very unstable L shoulder) NWB x 6 weeks, and L trimalleolar fx s/p ORIF NWB 6+ weeks with neurogenic bowel and bladder.  Continue CIR 2. Antithrombotics: -DVT/anticoagulation:Pharmaceutical:Lovenox -antiplatelet therapy: N/A 3. Pain Management: Post op pain and SCI AT LEVEL pain  On tylenol 1000 mg qid, ultram 25 mg qid, robaxin 500 mg qid, and Oxycodone prn.  Started gabapentin for nerve pain, increased on 11/12  OxyContin 50 twice daily started on 11/13  Controlled on 11/14 4. Mood:LCSW to follow for evaluation and support -antipsychotic agents: N/A 5. Neuropsych: This patientiscapable of making decisions onhisown behalf. 6. Skin/Wound Care:Routine pressure relief measures. 7. Fluids/Electrolytes/Nutrition:Monitor I/O.  8. Left shoulder dislocation s/p arthroscopy: Sling with NWB X 5 more weeks. 9. ORIF Left trimalleolar ankle fracture: NWB LLE 10 ABLA: Continue to monitor..  Hemoglobin 12.0 on 11/11, labs ordered for tomorrow  Continue to monitor 11. Neurogenic bowel:  KUB on 11/10 reviewed, showing mild constipation  Discontinued Celebrex as reports of abdominal pain  Change Senna S to plain senna.   Continue miralax.   Added Anusol HC cream for hems.  Started bowel program  12. Neurogenic bladder:  Started Flomax, increased on 11/14  Continue I/O caths, coud caths ordered with [employer]  See #15-will consider further medication adjustments after treatment 13. Steroid induced hyperglycemia:Question timing of weaning.  Continue to monitor 14.  Hypoalbuminemia  Supplement initiated on 11/14 15.  Acute lower UTI secondary to  Pseudomonas aeruginosa  WBCs 10.6 on 11/11, labs ordered for Monday  Cefepime started on 11/14      LOS: 4 days A FACE TO FACE EVALUATION WAS PERFORMED  [facility] Lorie Phenix 10/29/2019, 9:38 AM

## 2019-10-29 NOTE — Progress Notes (Signed)
Pharmacy Antibiotic Note  Louis Jordan is a 63 y.o. male admitted on 10/25/2019. Pharmacy has been consulted for cefepime dosing for pseudomonas UTI. Pt is afebrile and last WBC was minimally elevated. SCr is WNL. Of note, pt with penicillin allergy so will need to monitor for a reaction with the cefepime.    Plan: Cefepime 2gm IV Q12H F/u renal fxn, C&S, clinical status and LOT *Pharmacy will sign off as no further dose adjustments are anticipated. Thank you for the consult!  Height: 6' (182.9 cm) Weight: 137 lb 9.1 oz (62.4 kg) IBW/kg (Calculated) : 77.6  Temp (24hrs), Avg:98.9 F (37.2 C), Min:98.2 F (36.8 C), Max:99.4 F (37.4 C)  Recent Labs  Lab 10/26/19 0456  WBC 10.6*  CREATININE 0.84    Estimated Creatinine Clearance: 78.4 mL/min (by C-G formula based on SCr of 0.84 mg/dL).    Allergies  Allergen Reactions  . Penicillins Swelling    Seanne Chirico, Rande Lawman 10/29/2019 10:32 AM

## 2019-10-29 NOTE — Progress Notes (Signed)
Occupational Therapy Session Note  Patient Details  Name: Louis Jordan MRN: [id number] Date of Birth: 1956/07/03  Today's Date: 10/29/2019 OT Individual Time: 0623-7628 OT Individual Time Calculation (min): 75 min    Short Term Goals: Week 1:  OT Short Term Goal 1 (Week 1): Pt will instruct caregiver in donning shirt with min cueing OT Short Term Goal 2 (Week 1): Pt will don pants with LRAD with min A OT Short Term Goal 3 (Week 1): Pt will complete toileting tasks with min A  Skilled Therapeutic Interventions/Progress Updates: supine in bed upon OT approachfor therapy.   He agreed to participated as follows:  Patient was very particular and offered instructions for all aspects of donning LSO and transfers as per his prior trials with therapy.   He was congratulated for offering to all staff members consistent care and mobility information regarding himself.  Otherwise he participated as follows:  Bed mobility in prep for donning LSO=mod I Don LSO in bed supine = total A and patient able to practice providing accurate cues for any caregiver  Static sitting balance and dynamic= close S sit to stand in prep for activites of daily living=CGA and techniques to scoot to front of surface before intiitating going into standing Toilet transfer= CGA squat pivot similated toileting with total A due to Louis and LE NWB status and decreased balance Patient and this cliniican completed much problem solving as he directed this clinician to set up the extra wide drop arm BSC in the manner he will set up his at home (He stated that he will purchase the extra wide because he cannot get comfortable in the regular sized drop arm)  UB bathing=mod A patient preferred not to wash L UE this date in order to not doff brace Snap on/off shirt = Min A to unsnap and extra time to pull up and resnap closed with min cueing  Oral care= distant S   He chose not to doff or don pants this session  Patient was left seated  at beside with call bel within reach  Continue POC           Therapy Documentation Precautions:  Precautions Precautions: Fall, Back Precaution Comments: NWB Louis in sling Required Braces or Orthoses: Sling, Spinal Brace Spinal Brace: Applied in sitting position(pt prefers to don in supine) Spinal Brace Comments: custom LSO Restrictions Weight Bearing Restrictions: Yes Louis Weight Bearing: Non weight bearing LLE Weight Bearing: Non weight bearing   Pain:denied   Therapy/Group: Individual Therapy  Alfredia Ferguson [facility] 10/29/2019, 2:40 PM

## 2019-10-29 NOTE — Progress Notes (Addendum)
Physical Therapy Session Note  Patient Details  Name: Louis Jordan MRN: [id number] Date of Birth: [birth year]  Today's Date: 10/29/2019 PT Individual Time: 1445-1530 PT Individual Time Calculation (min): 45 min   Short Term Goals: Week 1:  PT Short Term Goal 1 (Week 1): Pt will complete least restrictive transfer with min A PT Short Term Goal 2 (Week 1): Pt will initiate gait training PT Short Term Goal 3 (Week 1): Pt will tolerate sitting up out of bed x 2 hours  Skilled Therapeutic Interventions/Progress Updates:   Upon first attempt, pt getting bladder scanned and needing to be cathed. Pt requesting therapist return later in day if possible. Was able to switch pt's session, however missed 15 min of skilled PT 2/2 RN care.   Pt in supine and agreeable to therapy during 2nd attempt, pain as detailed below. R/L rolling w/ supervision while therapist donned LSO. Supine>sit w/ supervision and increased time. Min assist squat pivot to w/c on R side. Pt self-propelled w/c to/from therapy gym. Worked on LLE strengthening in seated, see exercises listed below.   LLE strengthening: -3# knee march 3x12 -resisted heel slide w/ green theraband, 3x12 -4# LAQ 3x12 -resisted hip abduction w/ green theraband, 2x12 -adduction squeeze 3x12  Pt self-propelled w/c back to room and ended session in w/c, all needs in reach.   Therapy Documentation Precautions:  Precautions Precautions: Fall, Back Precaution Comments: NWB LUE in sling Required Braces or Orthoses: Sling, Spinal Brace Spinal Brace: Applied in sitting position(pt prefers to don in supine) Spinal Brace Comments: custom LSO Restrictions Weight Bearing Restrictions: Yes LUE Weight Bearing: Non weight bearing LLE Weight Bearing: Non weight bearing Vital Signs: Therapy Vitals Temp: 98.3 F (36.8 C) Pulse Rate: (!) 58 Resp: 18 BP: (!) 143/79 Patient Position (if appropriate): Lying Oxygen Therapy SpO2: 99 % O2 Device: Room  Air Pain: Pain Assessment Pain Scale: 0-10 Pain Score: 3  Pain Type: Acute pain Pain Location: Back Pain Orientation: Lower Pain Descriptors / Indicators: Aching Pain Frequency: Intermittent Pain Onset: On-going Patients Stated Pain Goal: 3 Pain Intervention(s): Medication (See eMAR)  Therapy/Group: Individual Therapy  Louis Jordan 10/29/2019, 3:40 PM

## 2019-10-29 NOTE — Plan of Care (Signed)
  Problem: Consults Goal: RH SPINAL CORD INJURY PATIENT EDUCATION Description:  See Patient Education module for education specifics.  Outcome: Progressing   Problem: SCI BOWEL ELIMINATION Goal: RH STG MANAGE BOWEL WITH ASSISTANCE Description: STG Manage Bowel with mod I Assistance. Outcome: Progressing Goal: RH STG SCI MANAGE BOWEL WITH MEDICATION WITH ASSISTANCE Description: STG SCI Manage bowel with medication with mod I assistance. Outcome: Progressing Goal: RH STG SCI MANAGE BOWEL PROGRAM W/ASSIST OR AS APPROPRIATE Description: STG SCI Manage bowel program with mod assist or as appropriate. Outcome: Progressing   Problem: SCI BLADDER ELIMINATION Goal: RH STG MANAGE BLADDER WITH ASSISTANCE Description: STG Manage Bladder With min Assistance Outcome: Progressing Goal: RH STG MANAGE BLADDER WITH MEDICATION WITH ASSISTANCE Description: STG Manage Bladder With Medication With mod I Assistance. Outcome: Progressing Goal: RH STG MANAGE BLADDER WITH EQUIPMENT WITH ASSISTANCE Description: STG Manage Bladder With Equipment With min Assistance Outcome: Progressing   Problem: RH SKIN INTEGRITY Goal: RH STG SKIN FREE OF INFECTION/BREAKDOWN Description: Pt will be free of skin breakdown or infection with min assist  Outcome: Progressing   Problem: RH PAIN MANAGEMENT Goal: RH STG PAIN MANAGED AT OR BELOW PT'S PAIN GOAL Description: Less than 4 on 0-10 scale Outcome: Progressing   Problem: RH KNOWLEDGE DEFICIT SCI Goal: RH STG INCREASE KNOWLEDGE OF SELF CARE AFTER SCI Description: Pt and family will be able to manage self care of fractures and perform bowel program with min assist prior to discharge. Pt will also be able to verbalize fall precautions to use at home.  Outcome: Progressing

## 2019-10-30 DIAGNOSIS — S24103A Unspecified injury at T7-T10 level of thoracic spinal cord, initial encounter: Secondary | ICD-10-CM

## 2019-10-30 NOTE — Progress Notes (Signed)
Social Work Assessment and Plan   Patient Details  Name: Louis Jordan MRN: [id number] Date of Birth: 1956/07/10  Today's Date: 10/28/2019  Problem List:  Patient Active Problem List   Diagnosis Date Noted  . Acute lower UTI   . Hypoalbuminemia due to protein-calorie malnutrition ([place])   . Acute blood loss anemia   . Postoperative pain   . Neuropathic pain   . Neurogenic bladder 10/26/2019  . Neurogenic bowel 10/26/2019  . Spinal cord injury, thoracic (T7-T12) ([place]) 10/25/2019  . Shoulder dislocation, left, initial encounter   . Closed burst fracture of lumbar vertebra ([place])   . Cauda equina syndrome ([place])   . Closed fracture of left ankle   . Fall from ladder 10/22/2019  . 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 Closed displaced trimalleolar fracture of left ankle 10/22/2019  . S/P lumbar fusion 10/16/2019  . Fracture of lumbar spine without lesion of spinal cord ([place]) 10/15/2019   Past Medical History:  Past Medical History:  Diagnosis Date  . Bundle branch block, right   . Complication of anesthesia   . Hand pain    mild OA bilateral  hands  . Left knee injury 1970   requiring surgery for repair of ligaments/cartilage damage with nerve injury that took 20 years to resolve  . MVP (mitral valve prolapse)   . PONV (postoperative nausea and vomiting)    Past Surgical History:  Past Surgical History:  Procedure Laterality Date  . APPENDECTOMY    . KNEE SURGERY Right   . LUMBAR SPINE SURGERY  10/16/2019   L1 burst fracture with epidural hematoma and canal stenosis  . OPEN REDUCTION INTERNAL FIXATION (ORIF) TIBIA/FIBULA FRACTURE Left 10/19/2019   Procedure: OPEN REDUCTION INTERNAL FIXATION (ORIF) TIBIA/FIBULA FRACTURE;  Surgeon: Shona Needles, MD;  Location: [facility];  Service: Orthopedics;  Laterality: Left;  . SHOULDER ARTHROSCOPY WITH BANKART REPAIR Left 10/19/2019   Procedure: SHOULDER ARTHROSCOPY WITH BANKART REPAIR, EXTENSIVE DEBRIDEMENT, LOOSE BODY REMOVAL;   Surgeon: Hiram Gash, MD;  Location: [facility];  Service: Orthopedics;  Laterality: Left;  . STRABISMUS SURGERY    . TONSILLECTOMY     Social History:  reports that he has never smoked. He has never used smokeless tobacco. He reports current alcohol use. He reports that he does not use drugs.  Family / Support Systems Marital Status: Married Patient Roles: Spouse, Parent Spouse/Significant Other: wife, Louis Jordan [phone] Children: adult children living nearby Anticipated Caregiver: wife Ability/Limitations of Caregiver: Min A Caregiver Availability: 24/7 Family Dynamics: Pt reports very close relationship with his adult children and wife.  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   Abuse/Neglect Abuse/Neglect Assessment Can Be Completed: Yes Physical Abuse: Denies Verbal Abuse: Denies Sexual Abuse: Denies Exploitation of patient/patient's resources: Denies Self-Neglect: Denies  Emotional Status Pt's affect, behavior and adjustment status: Pt lying in bed and very motivated for therapies.  Admits much frustration with himself that he "shouldn't have been up there on that ladder.. now I've put all this on my family."  Notes that he has a very strong faith "which is what is getting me  through all this." as well as his family support.  Will monitor mood - may benefit from neuropsychology while here. Recent Psychosocial Issues: NOne Psychiatric History: None Substance Abuse History: None  Patient / Family Perceptions, Expectations & Goals Pt/Family understanding of  illness & functional limitations: Pt and wife with good understanding about his injuries, limitations and need for CIR. 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 Anticipated changes in roles/activities/participation: Wife to assume primary caregiver role. Pt/family expectations/goals: "I just want to be able to do as much for myself as possible by the time I get out of here."  [employer]: None Premorbid Home Care/DME Agencies: None Transportation available at discharge: yes Resource referrals recommended: Neuropsychology  Discharge Planning Living Arrangements: Spouse/significant other Support Systems: Spouse/significant other, Children, Manufacturing engineer, Psychologist, clinical community Type of Residence: Private residence Insurance Resources: Media planner (specify)([facility]) Financial Resources: Employment Surveyor, quantity Screen Referred: No Living Expenses: Banker Management: Patient Does the patient have any problems obtaining your medications?: No Home Management: Pt and wife share responsibilities Patient/Family Preliminary Plans: Pt to return home with wife as primary support person. Social Work Anticipated Follow Up Needs: HH/OP Expected length of stay: 12-18 days  Clinical Impression Very pleasant gentleman here following a fall from a ladder at home and suffering multiple injuries.  Motivated for therapies and hopes to lessen as much burden on his wife as he can.  Admits much frustration with his situation, however, relying on his strong faith "to get through this."  Will follow for support and d/c planning needs.  Louis Jordan 10/28/2019, 12:51 PM

## 2019-10-30 NOTE — Progress Notes (Signed)
[facility] PHYSICAL MEDICINE & REHABILITATION PROGRESS NOTE   Subjective/Complaints: Patient seen laying in bed this AM.  He states he slept very well overnight.  He notes improvement in pain as well as bladder symptoms.  He states he is starting to feel the urge to urinate as well as to have a BM.  ROS: + Urinary retention, improving.  Denies CP, SOB, N/V/D/C  Objective:   No results found. No results for input(s): WBC, HGB, HCT, PLT in the last 72 hours. No results for input(s): NA, K, CL, CO2, GLUCOSE, BUN, CREATININE, CALCIUM in the last 72 hours.  Intake/Output Summary (Last 24 hours) at 10/30/2019 0938 Last data filed at 10/30/2019 0700 Gross per 24 hour  Intake 700 ml  Output 1701 ml  Net -1001 ml     Physical Exam: Vital Signs Blood pressure 129/77, pulse 62, temperature 98.3 F (36.8 C), resp. rate 18, height 6' (1.829 m), weight 60.2 kg, SpO2 96 %.  Constitutional: No distress . Vital signs reviewed. HENT: Normocephalic.  Atraumatic. Eyes: EOMI. No discharge. Cardiovascular: No JVD. Respiratory: Normal effort.  No stridor. GI: Non-distended. Skin: Left ankle with dressing C/D/I Psych: Normal mood.  Normal behavior. Musc: Left ankle with edema and tenderness Neuro: Alert Right upper extremities 5/5 proximal distal Left upper extremity: Proximally limited by sling, hand grip 5/5 Right lower extremity: 5/5 proximal distal Left lower extremity: Hip flexion, knee extension 4+/5, ankle braced, stable  Assessment/Plan: 1. 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  Physiatrist is providing close team supervision and 24 hour management of active medical problems listed below.  Physiatrist and rehab team continue to assess barriers to discharge/monitor  patient progress toward functional and medical goals  Care Tool:  Bathing  Bathing activity did not occur: Safety/medical concerns Body parts bathed by patient: Face   Body parts bathed by helper: Buttocks(back)     Bathing assist Assist Level: Minimal Assistance - Patient > 75%     Upper Body Dressing/Undressing Upper body dressing   What is the patient wearing?: Pull over shirt(that is button on the sides)    Upper body assist Assist Level: Moderate Assistance - Patient 50 - 74%    Lower Body Dressing/Undressing Lower body dressing      What is the patient wearing?: Underwear/pull up(short, ace wrap/soft cast to left leg lower leg)     Lower body assist Assist for lower body dressing: Moderate Assistance - Patient 50 - 74%     Toileting Toileting    Toileting assist Assist for toileting: 2 Helpers     Transfers Chair/bed transfer  Transfers assist     Chair/bed transfer assist level: Contact Guard/Touching assist     Locomotion Ambulation   Ambulation assist   Ambulation activity did not occur: Safety/medical concerns          Walk 10 feet activity   Assist  Walk 10 feet activity did not occur: Safety/medical concerns        Walk 50 feet activity   Assist Walk 50 feet with 2 turns activity did not occur: Safety/medical concerns         Walk 150 feet activity   Assist Walk 150 feet activity did not occur: Safety/medical concerns         Walk 10 feet on uneven  surface  activity   Assist Walk 10 feet on uneven surfaces activity did not occur: Safety/medical concerns         Wheelchair     Assist Will patient use wheelchair at discharge?: Yes Type of Wheelchair: Manual    Wheelchair assist level: Supervision/Verbal cueing Max wheelchair distance: 150'    Wheelchair 50 feet with 2 turns activity    Assist        Assist Level: Supervision/Verbal cueing   Wheelchair 150 feet activity     Assist       Assist Level: Supervision/Verbal cueing   Blood pressure 129/77, pulse 62, temperature 98.3 F (36.8 C), resp. rate 18, height 6' (1.829 m), weight 60.2 kg, SpO2 96 %.  Medical Problem List and Plan: 1.Impaired mobility/ADLs/Functionsecondary to Cauda equina syndrome, L shoulder dislocation s/p repair (very unstable L shoulder) NWB x 6 weeks, and L trimalleolar fx s/p ORIF NWB 6+ weeks with neurogenic bowel and bladder.  Cont CIR 2. Antithrombotics: -DVT/anticoagulation:Pharmaceutical:Lovenox -antiplatelet therapy: N/A 3. Pain Management: Post op pain and SCI AT LEVEL pain  On tylenol 1000 mg qid, ultram 25 mg qid, robaxin 500 mg qid, and Oxycodone prn.  Started gabapentin for nerve pain, increased on 11/12  OxyContin 50 twice daily started on 11/13  Improving on 11/15 4. Mood:LCSW to follow for evaluation and support -antipsychotic agents: N/A 5. Neuropsych: This patientiscapable of making decisions onhisown behalf. 6. Skin/Wound Care:Routine pressure relief measures. 7. Fluids/Electrolytes/Nutrition:Monitor I/O.  8. Left shoulder dislocation s/p arthroscopy: Sling with NWB X 5 more weeks. 9. ORIF Left trimalleolar ankle fracture: NWB LLE 10 ABLA: Continue to monitor..  Hemoglobin 12.0 on 11/11, labs ordered for tomorrow  Continue to monitor 11. Neurogenic bowel:  KUB on 11/10 reviewed, showing mild constipation  Discontinued Celebrex as reports of abdominal pain  Change Senna S to plain senna.   Continue miralax.   Added Anusol HC cream for hems.  Started bowel program  12. Neurogenic bladder:  Started Flomax, increased on 11/14  Continue I/O caths, coud caths ordered with Urojet  See #15-will consider further medication adjustments after treatment  Seems to be improving 13. Steroid induced hyperglycemia:Question timing of weaning.  Continue to monitor 14.  Hypoalbuminemia  Supplement initiated on 11/14 15.  Acute  lower UTI secondary to Pseudomonas aeruginosa  WBCs 10.6 on 11/11, labs ordered for tomorrow  Continue Cefepime started on 11/14  LOS: 5 days A FACE TO FACE EVALUATION WAS PERFORMED  Ankit Lorie Phenix 10/30/2019, 9:38 AM

## 2019-10-30 NOTE — Plan of Care (Signed)
  Problem: Consults Goal: RH SPINAL CORD INJURY PATIENT EDUCATION Description:  See Patient Education module for education specifics.  Outcome: Progressing Goal: Skin Care Protocol Initiated - if Braden Score 18 or less Description: If consults are not indicated, leave blank or document N/A Outcome: Progressing   Problem: SCI BOWEL ELIMINATION Goal: RH STG MANAGE BOWEL WITH ASSISTANCE Description: STG Manage Bowel with mod I Assistance. Outcome: Progressing Goal: RH STG SCI MANAGE BOWEL WITH MEDICATION WITH ASSISTANCE Description: STG SCI Manage bowel with medication with mod I assistance. Outcome: Progressing Goal: RH STG SCI MANAGE BOWEL PROGRAM W/ASSIST OR AS APPROPRIATE Description: STG SCI Manage bowel program with mod assist or as appropriate. Outcome: Progressing   Problem: SCI BLADDER ELIMINATION Goal: RH STG MANAGE BLADDER WITH ASSISTANCE Description: STG Manage Bladder With min Assistance Outcome: Progressing Goal: RH STG MANAGE BLADDER WITH MEDICATION WITH ASSISTANCE Description: STG Manage Bladder With Medication With mod I Assistance. Outcome: Progressing Goal: RH STG MANAGE BLADDER WITH EQUIPMENT WITH ASSISTANCE Description: STG Manage Bladder With Equipment With min Assistance Outcome: Progressing   Problem: RH SKIN INTEGRITY Goal: RH STG SKIN FREE OF INFECTION/BREAKDOWN Description: Pt will be free of skin breakdown or infection with min assist  Outcome: Progressing   Problem: RH PAIN MANAGEMENT Goal: RH STG PAIN MANAGED AT OR BELOW PT'S PAIN GOAL Description: Less than 4 on 0-10 scale Outcome: Progressing   Problem: RH KNOWLEDGE DEFICIT SCI Goal: RH STG INCREASE KNOWLEDGE OF SELF CARE AFTER SCI Description: Pt and family will be able to manage self care of fractures and perform bowel program with min assist prior to discharge. Pt will also be able to verbalize fall precautions to use at home.  Outcome: Progressing   

## 2019-10-31 ENCOUNTER — Inpatient Hospital Stay (HOSPITAL_COMMUNITY): Payer: Self-pay | Admitting: Occupational Therapy

## 2019-10-31 ENCOUNTER — Inpatient Hospital Stay (HOSPITAL_COMMUNITY): Payer: Self-pay | Admitting: Physical Therapy

## 2019-10-31 LAB — CBC
HCT: 31.8 % — ABNORMAL LOW (ref 39.0–52.0)
Hemoglobin: 10.8 g/dL — ABNORMAL LOW (ref 13.0–17.0)
MCH: 32.2 pg (ref 26.0–34.0)
MCHC: 34 g/dL (ref 30.0–36.0)
MCV: 94.9 fL (ref 80.0–100.0)
Platelets: 400 10*3/uL (ref 150–400)
RBC: 3.35 MIL/uL — ABNORMAL LOW (ref 4.22–5.81)
RDW: 14.6 % (ref 11.5–15.5)
WBC: 10 10*3/uL (ref 4.0–10.5)
nRBC: 0 % (ref 0.0–0.2)

## 2019-10-31 LAB — BASIC METABOLIC PANEL
Anion gap: 10 (ref 5–15)
BUN: 23 mg/dL (ref 8–23)
CO2: 28 mmol/L (ref 22–32)
Calcium: 8.6 mg/dL — ABNORMAL LOW (ref 8.9–10.3)
Chloride: 96 mmol/L — ABNORMAL LOW (ref 98–111)
Creatinine, Ser: 0.75 mg/dL (ref 0.61–1.24)
GFR calc Af Amer: >60 mL/min (ref >60)
GFR calc non Af Amer: >60 mL/min (ref >60)
Glucose, Bld: 120 mg/dL — ABNORMAL HIGH (ref 70–99)
Potassium: 4.3 mmol/L (ref 3.5–5.1)
Sodium: 134 mmol/L — ABNORMAL LOW (ref 135–145)

## 2019-10-31 MED ORDER — BETHANECHOL CHLORIDE 10 MG PO TABS
10.0000 mg | ORAL_TABLET | Freq: Three times a day (TID) | ORAL | Status: DC
  Administered 2019-10-31 – 2019-11-01 (×4): 10 mg via ORAL
  Filled 2019-10-31 (×3): qty 1

## 2019-10-31 MED ORDER — FERROUS SULFATE 325 (65 FE) MG PO TABS
325.0000 mg | ORAL_TABLET | Freq: Every day | ORAL | Status: DC
  Administered 2019-11-01 – 2019-11-08 (×8): 325 mg via ORAL
  Filled 2019-10-31 (×8): qty 1

## 2019-10-31 NOTE — Care Management Note (Signed)
Inpatient [facility] Individual Statement of Services  Patient Name:  Louis Jordan  Date:  10/31/2019  Welcome to the [facility].  Our goal is to provide you with an individualized program based on your diagnosis and situation, designed to meet your specific needs.  With this comprehensive rehabilitation program, you will be expected to participate in at least 3 hours of rehabilitation therapies Monday-Friday, with modified therapy programming on the weekends.  Your rehabilitation program will include the following services:  Physical Therapy (PT), Occupational Therapy (OT), 24 hour per day rehabilitation nursing, Therapeutic Recreaction (TR), Neuropsychology, Case Management (Social Worker), Rehabilitation Medicine, Nutrition Services and Pharmacy Services  Weekly team conferences will be held on Tuesdays to discuss your progress.  Your Social Worker will talk with you frequently to get your input and to update you on team discussions.  Team conferences with you and your family in attendance may also be held.  Expected length of stay: 12-18 days   Overall anticipated outcome:  Minimal assistance  Depending on your progress and recovery, your program may change. Your Social Worker will coordinate services and will keep you informed of any changes. Your Social Worker's name and contact numbers are listed  below.  The following services may also be recommended but are not provided by the [facility] will be made to provide these services after discharge if needed.  Arrangements include referral to agencies that provide these services.  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  Social Worker:  [place], [place] or ([phone]   Information discussed with and copy given to patient by: Lennart Pall, 10/31/2019, 2:10 PM

## 2019-10-31 NOTE — Progress Notes (Signed)
Physical Therapy Session Note  Patient Details  Name: Louis Jordan MRN: [id number] Date of Birth: 1956/03/01  Today's Date: 10/31/2019 PT Individual Time: 1100-1200 PT Individual Time Calculation (min): 60 min   Short Term Goals: Week 1:  PT Short Term Goal 1 (Week 1): Pt will complete least restrictive transfer with min A PT Short Term Goal 2 (Week 1): Pt will initiate gait training PT Short Term Goal 3 (Week 1): Pt will tolerate sitting up out of bed x 2 hours  Skilled Therapeutic Interventions/Progress Updates:    Pt received seated on commode in room finishing toileting with RN. Pt is CGA to stand from commode to hemiwalker, requires min A x 1 to maintain standing balance with use of HW with assist from a 2nd person for dependent pericare. Stand pivot transfer commode to bed with CGA and HW. Pt is setup A to pull on pants with use of reacher, sit to stand with CGA to [facility] then min A to pull pants up over hips. Stand pivot transfer bed to w/c with HW and CGA. Pt is at Supervision level for w/c mobility x 150 ft with use of R UE/LE. Pt does require assist for management of L brake and L leg rest. Pt's family has completed home measurement sheet so reviewed home setup with patient. Per pt his family can build a small ramp for one step to enter from garage. The most narrow doorway patient needs to be able to fit through is 28" wide and his 16x16 manual w/c is 23.5-24" wide at most so the w/c should fit through most areas in the home. Per pt the seat of the car he can use to take home is 32" in the front seat and 30" in the back. Car transfer on simulation car at 30" height with HW and CGA, v/c for safe transfer technique. Due to significant height pt has to scoot hips backwards onto seat once perched on the edge, it is not likely to be safe to attempt transfer at seat height any higher than 30". Recommending that pt transfer into backseat of this vehicle as he will also have more room to maneuver as the  back door of the car opens almost to 90 degrees from car. [name] can also bring in their actual vehicle later this week to practice real car transfer. Stand pivot transfer w/c to real bed in simulation apartment to 26" bed height. Pt is CGA for stand pivot transfer to bed. Sit to supine initially with mod A for BLE management. Supine to sit with Supervision. Sit to supine with CGA for balance. Supine to sit with Supervision. Stand pivot transfer back to w/c with HW and CGA. Per home measurement sheet pt's actual bed is 30" tall, will simulate this next session. Pt left seated in w/c in room with needs in reach at end of session.  Therapy Documentation Precautions:  Precautions Precautions: Fall, Back Precaution Comments: NWB LUE in sling Required Braces or Orthoses: Sling, Spinal Brace Spinal Brace: Applied in sitting position(pt prefers to don in supine) Spinal Brace Comments: custom LSO Restrictions Weight Bearing Restrictions: Yes LUE Weight Bearing: Non weight bearing LLE Weight Bearing: Non weight bearing    Therapy/Group: Individual Therapy   Excell Seltzer, PT, DPT  10/31/2019, 12:56 PM

## 2019-10-31 NOTE — Progress Notes (Signed)
Occupational Therapy Session Note  Patient Details  Name: Louis Jordan MRN: [id number] Date of Birth: July 31, 1956  Today's Date: 10/31/2019 OT Individual Time: 1000-1055 and 1300-1420 OT Individual Time Calculation (min): 55 min and 80 min   Short Term Goals: Week 1:  OT Short Term Goal 1 (Week 1): Pt will instruct caregiver in donning shirt with min cueing OT Short Term Goal 2 (Week 1): Pt will don pants with LRAD with min A OT Short Term Goal 3 (Week 1): Pt will complete toileting tasks with min A  Skilled Therapeutic Interventions/Progress Updates:    Therapist arrived at 8:45 for scheduled OT session, RN present scanning bladder. PT requesting for therapist to return at lateral time once nursing care complete.  Session One: Therapist returned at 10:00. Pt in supine, denying pain. Reports he has still not received dig.stim. from nursing. Pt agreeable to get up to [facility] for toileting task and for RN to perform dig.stim. LSO donned total A in supine with pt directing care. He transferred to sitting EOB with min A for management of LEs and use of bed rail. Completed min A stand pivot transfer to [facility] using hemi-walker. Pt unable to maintain L NWB pre-cautions during transfers and requires multi-modal cuing during static standing to position and maintain L LE in [place] position.  Pt able to stand with min A and L foot propped on therapist's while RN performed dig stim. He requires +2 assist for toileting in order to maintain WBing status and for functional standing balance while 2nd person provides care.  Pt left seated on BSC at end of session with nursing staff present to complete in/out cath.  Extensive education/discussion throughout session regarding Bowel/bladder prgram and benefits of upright toileting, activity progression, DME and d/c planning  Session two: Pt seen for Ot session focusing on functional transfers, mobility and ADL re-training. Pt sitting up in w/c upon arrival, agreeable to tx  session and denying pain. DIscussed at length d/c planning, modified ADLs, reducing caregiver burden and DME.  Educated regarding modified ADLs at seated level vs. Standing for toileing and LB dressing tasks. He completed squat pivot transfer to wide drop arm BSC with foot propped on therapist's to ensure NWBing. Instructed in lateral leans for clothing management and completed with supervision/VCs for technique. Discussed this as option in order to increase independence with toileting/LB dressing tasks and reduce caregiver burden vs. Standing with hemi-walker to complete.  Worked on pt's static and dynamic standing balance while standing with hemi-walker. Pt required multi-modal cuing for adherence to L LE NWBing during standing trials. Initially min A with UE support, however, progressing to guarding assist while pt managed pants up/down in simulation of dressing/toileting tasks. Pt excited about his functional gains with standing. Self propelled w/c using hemi technique with supervision.  Requested to return to bed at end of session, CGA squat pivot transfer to bed. Pt left in supine with all needs in reach and bed alarm on.     Therapy Documentation Precautions:  Precautions Precautions: Fall, Back Precaution Comments: NWB LUE in sling Required Braces or Orthoses: Sling, Spinal Brace Spinal Brace: Applied in sitting position(pt prefers to don in supine) Spinal Brace Comments: custom LSO Restrictions Weight Bearing Restrictions: Yes LUE Weight Bearing: Non weight bearing LLE Weight Bearing: Non weight bearing   Therapy/Group: Individual Therapy  Talena Neira L 10/31/2019, 7:06 AM

## 2019-10-31 NOTE — Progress Notes (Signed)
[facility] PHYSICAL MEDICINE & REHABILITATION PROGRESS NOTE   Subjective/Complaints: No new complaints. Happy that he's moving better. Still with urine retention req I/o caths. Incontinent soft bm  ROS: Patient denies fever, rash, sore throat, blurred vision, nausea, vomiting, diarrhea, cough, shortness of breath or chest pain,  headache, or mood change.    Objective:   No results found. Recent Labs    10/31/19 0519  WBC 10.0  HGB 10.8*  HCT 31.8*  PLT 400   Recent Labs    10/31/19 0519  NA 134*  K 4.3  CL 96*  CO2 28  GLUCOSE 120*  BUN 23  CREATININE 0.75  CALCIUM 8.6*    Intake/Output Summary (Last 24 hours) at 10/31/2019 0944 Last data filed at 10/31/2019 0730 Gross per 24 hour  Intake 460 ml  Output 3370 ml  Net -2910 ml     Physical Exam: Vital Signs Blood pressure (!) 164/78, pulse (!) 55, temperature 98 F (36.7 C), temperature source Oral, resp. rate 17, height 6' (1.829 m), weight 60.2 kg, SpO2 95 %.  Constitutional: No distress . Vital signs reviewed. HEENT: EOMI, oral membranes moist Neck: supple Cardiovascular: RRR without murmur. No JVD    Respiratory: CTA Bilaterally without wheezes or rales. Normal effort    GI: BS +, non-tender, non-distended  Skin: Left ankle with dressing C/D/I intact. Skin warm Psych: Normal mood.  Normal behavior. Musc: Left ankle with edema and tenderness Neuro: Alert Right upper extremities 5/5 proximal distal Left upper extremity: Proximally limited by sling, hand grip 5/5 Right lower extremity: 5/5 proximal distal Left lower extremity: Hip flexion, knee extension 4+/5, ankle braced, stable motor ?mild sensory loss over 5th toes bilaterally  Assessment/Plan: 1. Functional deficits secondary to Cauda Equina syndrome due to L1 burst fx, and polytrauma causing L trimalleolar fx and L shoulder dislocation and labral tear which require 3+ hours per day of interdisciplinary therapy in a comprehensive inpatient rehab  setting.  Physiatrist is providing close team supervision and 24 hour management of active medical problems listed below.  Physiatrist and rehab team continue to assess barriers to discharge/monitor patient progress toward functional and medical goals  Care Tool:  Bathing  Bathing activity did not occur: Safety/medical concerns Body parts bathed by patient: Face   Body parts bathed by helper: Buttocks(back)     Bathing assist Assist Level: Minimal Assistance - Patient > 75%     Upper Body Dressing/Undressing Upper body dressing   What is the patient wearing?: Pull over shirt(that is button on the sides)    Upper body assist Assist Level: Moderate Assistance - Patient 50 - 74%    Lower Body Dressing/Undressing Lower body dressing      What is the patient wearing?: Underwear/pull up(short, ace wrap/soft cast to left leg lower leg)     Lower body assist Assist for lower body dressing: Moderate Assistance - Patient 50 - 74%     Toileting Toileting    Toileting assist Assist for toileting: 2 Helpers     Transfers Chair/bed transfer  Transfers assist     Chair/bed transfer assist level: Contact Guard/Touching assist     Locomotion Ambulation   Ambulation assist   Ambulation activity did not occur: Safety/medical concerns          Walk 10 feet activity   Assist  Walk 10 feet activity did not occur: Safety/medical concerns        Walk 50 feet activity   Assist Walk 50 feet with 2  turns activity did not occur: Safety/medical concerns         Walk 150 feet activity   Assist Walk 150 feet activity did not occur: Safety/medical concerns         Walk 10 feet on uneven surface  activity   Assist Walk 10 feet on uneven surfaces activity did not occur: Safety/medical concerns         Wheelchair     Assist Will patient use wheelchair at discharge?: Yes Type of Wheelchair: Manual    Wheelchair assist level: Supervision/Verbal  cueing Max wheelchair distance: 150'    Wheelchair 50 feet with 2 turns activity    Assist        Assist Level: Supervision/Verbal cueing   Wheelchair 150 feet activity     Assist      Assist Level: Supervision/Verbal cueing   Blood pressure (!) 164/78, pulse (!) 55, temperature 98 F (36.7 C), temperature source Oral, resp. rate 17, height 6' (1.829 m), weight 60.2 kg, SpO2 95 %.  Medical Problem List and Plan: 1.Impaired mobility/ADLs/Functionsecondary to Cauda equina syndrome, L shoulder dislocation s/p repair (very unstable L shoulder) NWB x 6 weeks, and L trimalleolar fx s/p ORIF NWB 6+ weeks with neurogenic bowel and bladder.  Cont CIR PT OT 2. Antithrombotics: -DVT/anticoagulation:Pharmaceutical:Lovenox -antiplatelet therapy: N/A 3. Pain Management: Post op pain and SCI AT LEVEL pain  On tylenol 1000 mg qid, ultram 25 mg qid, robaxin 500 mg qid, and Oxycodone prn.  Started gabapentin for nerve pain, increased on 11/12  OxyContin 50 twice daily started on 11/13  Improving on 11/16 4. Mood:LCSW to follow for evaluation and support -antipsychotic agents: N/A 5. Neuropsych: This patientiscapable of making decisions onhisown behalf. 6. Skin/Wound Care:Routine pressure relief measures. 7. Fluids/Electrolytes/Nutrition:Monitor I/O.  8. Left shoulder dislocation s/p arthroscopy: Sling with NWB X 5 more weeks. 9. ORIF Left trimalleolar ankle fracture: NWB LLE 10 ABLA: Continue to monitor..  Hemoglobin 12.0 on 11/11--->10.8 today, no signs of blood loss  Fe++ supp 11. Neurogenic bowel:  KUB on 11/10 reviewed, showing mild constipation  Discontinued Celebrex as reports of abdominal pain  Change Senna S to plain senna.   Continue miralax.   Added Anusol HC cream for hems.  continue bowel program  12. Neurogenic bladder:  Started Flomax, increased on 11/14  Continue I/O caths, coud caths ordered with Urojet  RX  UTI  -begin trial of urecholine 10mg  tid 13. Steroid induced hyperglycemia:Question timing of weaning.  Continue to monitor 14.  Hypoalbuminemia  Supplement initiated on 11/14 15.  Acute lower UTI secondary to Pseudomonas aeruginosa  WBCs 10.0 11/16  Continue Cefepime started on 11/14  LOS: 6 days A FACE TO [place] 10/31/2019, 9:44 AM

## 2019-10-31 NOTE — Plan of Care (Signed)
  Problem: Consults Goal: RH SPINAL CORD INJURY PATIENT EDUCATION Description:  See Patient Education module for education specifics.  Outcome: Progressing Goal: Skin Care Protocol Initiated - if Braden Score 18 or less Description: If consults are not indicated, leave blank or document N/A Outcome: Progressing   Problem: SCI BOWEL ELIMINATION Goal: RH STG MANAGE BOWEL WITH ASSISTANCE Description: STG Manage Bowel with mod I Assistance. Outcome: Progressing Flowsheets (Taken 10/31/2019 1801) STG: Pt will manage bowels with assistance: 3-Moderate assistance Goal: RH STG SCI MANAGE BOWEL WITH MEDICATION WITH ASSISTANCE Description: STG SCI Manage bowel with medication with mod I assistance. Outcome: Progressing Goal: RH STG SCI MANAGE BOWEL PROGRAM W/ASSIST OR AS APPROPRIATE Description: STG SCI Manage bowel program with mod assist or as appropriate. Outcome: Progressing Flowsheets (Taken 10/31/2019 1801) STG: SCI Pt will manage bowels with assistance or as appropriate: Moderate assist   Problem: SCI BLADDER ELIMINATION Goal: RH STG MANAGE BLADDER WITH ASSISTANCE Description: STG Manage Bladder With min Assistance Outcome: Progressing Flowsheets (Taken 10/31/2019 1801) STG: Pt will manage bladder with assistance: 3-Moderate assistance Goal: RH STG MANAGE BLADDER WITH MEDICATION WITH ASSISTANCE Description: STG Manage Bladder With Medication With mod I Assistance. Outcome: Progressing Flowsheets (Taken 10/31/2019 1801) STG: Pt will manage bladder with medication with assistance: 3-Moderate assistance Goal: RH STG MANAGE BLADDER WITH EQUIPMENT WITH ASSISTANCE Description: STG Manage Bladder With Equipment With min Assistance Outcome: Progressing Flowsheets (Taken 10/31/2019 1801) STG: Pt will manage bladder with equipment with assistance: 3-Moderate assistance   Problem: RH SKIN INTEGRITY Goal: RH STG SKIN FREE OF INFECTION/BREAKDOWN Description: Pt will be free of skin  breakdown or infection with min assist  Outcome: Progressing   Problem: RH PAIN MANAGEMENT Goal: RH STG PAIN MANAGED AT OR BELOW PT'S PAIN GOAL Description: Less than 4 on 0-10 scale Outcome: Progressing   Problem: RH KNOWLEDGE DEFICIT SCI Goal: RH STG INCREASE KNOWLEDGE OF SELF CARE AFTER SCI Description: Pt and family will be able to manage self care of fractures and perform bowel program with min assist prior to discharge. Pt will also be able to verbalize fall precautions to use at home.  Outcome: Progressing

## 2019-11-01 ENCOUNTER — Inpatient Hospital Stay (HOSPITAL_COMMUNITY): Payer: Commercial Managed Care - PPO | Admitting: Physical Therapy

## 2019-11-01 ENCOUNTER — Inpatient Hospital Stay (HOSPITAL_COMMUNITY): Payer: Commercial Managed Care - PPO | Admitting: Occupational Therapy

## 2019-11-01 ENCOUNTER — Inpatient Hospital Stay (HOSPITAL_COMMUNITY): Payer: Commercial Managed Care - PPO

## 2019-11-01 MED ORDER — BETHANECHOL CHLORIDE 25 MG PO TABS
25.0000 mg | ORAL_TABLET | Freq: Three times a day (TID) | ORAL | Status: DC
  Administered 2019-11-01 – 2019-11-04 (×11): 25 mg via ORAL
  Filled 2019-11-01 (×11): qty 1

## 2019-11-01 NOTE — Plan of Care (Signed)
  Problem: Consults Goal: RH SPINAL CORD INJURY PATIENT EDUCATION Description:  See Patient Education module for education specifics.  Outcome: Progressing Goal: Skin Care Protocol Initiated - if Braden Score 18 or less Description: If consults are not indicated, leave blank or document N/A Outcome: Progressing   Problem: SCI BOWEL ELIMINATION Goal: RH STG MANAGE BOWEL WITH ASSISTANCE Description: STG Manage Bowel with mod I Assistance. Outcome: Progressing Goal: RH STG SCI MANAGE BOWEL WITH MEDICATION WITH ASSISTANCE Description: STG SCI Manage bowel with medication with mod I assistance. Outcome: Progressing Goal: RH STG SCI MANAGE BOWEL PROGRAM W/ASSIST OR AS APPROPRIATE Description: STG SCI Manage bowel program with mod assist or as appropriate. Outcome: Progressing   Problem: SCI BLADDER ELIMINATION Goal: RH STG MANAGE BLADDER WITH ASSISTANCE Description: STG Manage Bladder With min Assistance Outcome: Progressing Goal: RH STG MANAGE BLADDER WITH MEDICATION WITH ASSISTANCE Description: STG Manage Bladder With Medication With mod I Assistance. Outcome: Progressing Goal: RH STG MANAGE BLADDER WITH EQUIPMENT WITH ASSISTANCE Description: STG Manage Bladder With Equipment With min Assistance Outcome: Progressing   Problem: RH SKIN INTEGRITY Goal: RH STG SKIN FREE OF INFECTION/BREAKDOWN Description: Pt will be free of skin breakdown or infection with min assist  Outcome: Progressing   Problem: RH PAIN MANAGEMENT Goal: RH STG PAIN MANAGED AT OR BELOW PT'S PAIN GOAL Description: Less than 4 on 0-10 scale Outcome: Progressing   Problem: RH KNOWLEDGE DEFICIT SCI Goal: RH STG INCREASE KNOWLEDGE OF SELF CARE AFTER SCI Description: Pt and family will be able to manage self care of fractures and perform bowel program with min assist prior to discharge. Pt will also be able to verbalize fall precautions to use at home.  Outcome: Progressing

## 2019-11-01 NOTE — Progress Notes (Signed)
Physical Therapy Session Note  Patient Details  Name: Louis Jordan MRN: [id number] Date of Birth: 12/28/1955  Today's Date: 11/01/2019 PT Individual Time: 0900-1015; 1130-1200 PT Individual Time Calculation (min): 75 min and 30 min  Short Term Goals: Week 1:  PT Short Term Goal 1 (Week 1): Pt will complete least restrictive transfer with min A PT Short Term Goal 2 (Week 1): Pt will initiate gait training PT Short Term Goal 3 (Week 1): Pt will tolerate sitting up out of bed x 2 hours  Skilled Therapeutic Interventions/Progress Updates:    Session 1: Pt received seated in bed, agreeable to PT session. No complaints of pain. Rolling to the R with Supervision for dependent donning of LSO. Supine to sit with Supervision. Stand pivot transfer bed to/from w/c from 30" bed height and 27.5" bed height to simulate home setup. Pt requires min A fading to CGA with use of HW for stand pivot transfer. Pt has one instance of near LOB that he is able to recover with min A. Also discussed d/c plan for patient and that physically he is close to a level at which he can d/c home safely. Pt expresses concern about I/O caths and having his wife trained on performing these. Setup family education session for wife for 11/19 for hands on training with therapy followed by training with nursing for bowel and bladder management. Team aware of pt's concerns with regards to I/O caths being performed safely and having his wife trained adequately to perform this. Pt has incontinence of bowel at end of session. Sit to supine CGA. Pt is able to roll supine to/from R side at Supervision level while dependent brief change and pericare performed. Pt left supine in bed with needs in reach at end of session.  Session 2: Pt received supine in bed, agreeable to PT session. Pt is dependent to don LSO at bed level. Supine to sit with Supervision. Stand pivot transfer bed to w/c with hemiwalker and CGA. Reviewed placement of w/c for safe  transfer and for management of hemiwalker. Sit to stand x 5 reps from w/c to hemiwalker with CGA progressing to close SBA with v/c for safe hand placement during transfer and eccentric control when sitting. Pt demos good adherence to LLE WBing precautions with transfer. Pt left seated in w/c in room with needs in reach at end of session.   Therapy Documentation Precautions:  Precautions Precautions: Fall, Back Precaution Comments: NWB LUE in sling Required Braces or Orthoses: Sling, Spinal Brace Spinal Brace: Applied in sitting position(pt prefers to don in supine) Spinal Brace Comments: custom LSO Restrictions Weight Bearing Restrictions: Yes LUE Weight Bearing: Non weight bearing LLE Weight Bearing: Non weight bearing    Therapy/Group: Individual Therapy   Excell Seltzer, PT, DPT  11/01/2019, 12:37 PM

## 2019-11-01 NOTE — Progress Notes (Signed)
[name] PHYSICAL MEDICINE & REHABILITATION PROGRESS NOTE   Subjective/Complaints: No new complaints. Happy that he's moving better.  States that last night he had his best night of sleep. Also notes that the tingling in his lower extremities has improved. Denies constipation.  ROS: Patient denies fever, rash, sore throat, blurred vision, nausea, vomiting, diarrhea, cough, shortness of breath or chest pain,  headache, or mood change.    Objective:   No results found. Recent Labs    10/31/19 0519  WBC 10.0  HGB 10.8*  HCT 31.8*  PLT 400   Recent Labs    10/31/19 0519  NA 134*  K 4.3  CL 96*  CO2 28  GLUCOSE 120*  BUN 23  CREATININE 0.75  CALCIUM 8.6*    Intake/Output Summary (Last 24 hours) at 11/01/2019 0908 Last data filed at 11/01/2019 0720 Gross per 24 hour  Intake 822 ml  Output 4699 ml  Net -3877 ml     Physical Exam: Vital Signs Blood pressure (!) 161/75, pulse 70, temperature 98.2 F (36.8 C), temperature source Oral, resp. rate 18, height 6' (1.829 m), weight 60.2 kg, SpO2 98 %.  Constitutional: No distress . Vital signs reviewed. Sitting upright in wheelchair using grabber.  HEENT: EOMI, oral membranes moist Neck: supple Cardiovascular: RRR without murmur. No JVD    Respiratory: CTA Bilaterally without wheezes or rales. Normal effort    GI: BS +, non-tender, non-distended  Skin: Left ankle with dressing C/D/I intact. Skin warm Psych: Normal mood.  Normal behavior. Musc: Left ankle with edema and tenderness Neuro: Alert Right upper extremities 5/5 proximal distal Left upper extremity: Proximally limited by sling, hand grip 5/5 Right lower extremity: 5/5 proximal distal Left lower extremity: Hip flexion, knee extension 4+/5, ankle braced, stable motor ?mild sensory loss over 5th toes bilaterally  Assessment/Plan: 1. 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  Physiatrist is providing close team supervision and 24 hour management of active medical problems listed below.  Physiatrist and rehab team continue to assess barriers to discharge/monitor patient progress toward functional and medical goals  Care Tool:  Bathing  Bathing activity did not occur: Safety/medical concerns Body parts bathed by patient: Face   Body parts bathed by helper: Buttocks(back)     Bathing assist Assist Level: Minimal Assistance - Patient > 75%     Upper Body Dressing/Undressing Upper body dressing   What is the patient wearing?: Pull over shirt(that is button on the sides)    Upper body assist Assist Level: Moderate Assistance - Patient 50 - 74%    Lower Body Dressing/Undressing Lower body dressing      What is the patient wearing?: Underwear/pull up(short, ace wrap/soft cast to left leg lower leg)     Lower body assist Assist for lower body dressing: Moderate Assistance - Patient 50 - 74%     Toileting Toileting    Toileting assist Assist for toileting: Contact Guard/Touching assist     Transfers Chair/bed transfer  Transfers assist     Chair/bed transfer assist level: Contact Guard/Touching assist     Locomotion Ambulation   Ambulation assist   Ambulation activity did not occur: Safety/medical concerns          Walk 10 feet activity   Assist  Walk 10 feet activity did not  occur: Safety/medical concerns        Walk 50 feet activity   Assist Walk 50 feet with 2 turns activity did not occur: Safety/medical concerns         Walk 150 feet activity   Assist Walk 150 feet activity did not occur: Safety/medical concerns         Walk 10 feet on uneven surface  activity   Assist Walk 10 feet on uneven surfaces activity did not occur: Safety/medical concerns          Wheelchair     Assist Will patient use wheelchair at discharge?: Yes Type of Wheelchair: Manual    Wheelchair assist level: Supervision/Verbal cueing Max wheelchair distance: 150'    Wheelchair 50 feet with 2 turns activity    Assist        Assist Level: Supervision/Verbal cueing   Wheelchair 150 feet activity     Assist      Assist Level: Supervision/Verbal cueing   Blood pressure (!) 161/75, pulse 70, temperature 98.2 F (36.8 C), temperature source Oral, resp. rate 18, height 6' (1.829 m), weight 60.2 kg, SpO2 98 %.  Medical Problem List and Plan: 1.Impaired mobility/ADLs/Functionsecondary to Cauda equina syndrome, L shoulder dislocation s/Louis repair (very unstable L shoulder) NWB x 6 weeks, and L trimalleolar fx s/Louis ORIF NWB 6+ weeks with neurogenic bowel and bladder.  Cont CIR PT OT 2. Antithrombotics: -DVT/anticoagulation:Pharmaceutical:Lovenox -antiplatelet therapy: N/A 3. Pain Management: Post op pain and SCI AT LEVEL pain  On tylenol 1000 mg qid, ultram 25 mg qid, robaxin 500 mg qid, and Oxycodone prn.  Started gabapentin for nerve pain, increased on 11/12, tingling improved on 11/17.   OxyContin 50 twice daily started on 11/13  Improving on 11/16 4. Mood:LCSW to follow for evaluation and support -antipsychotic agents: N/A 5. Neuropsych: This patientiscapable of making decisions onhisown behalf. 6. Skin/Wound Care:Routine pressure relief measures. 7. Fluids/Electrolytes/Nutrition:Monitor I/O.  8. Left shoulder dislocation s/Louis arthroscopy: Sling with NWB X 5 more weeks. 9. ORIF Left trimalleolar ankle fracture: NWB LLE 10 ABLA: Continue to monitor..  Hemoglobin 12.0 on 11/11--->10.8 today, no signs of blood loss  Fe++ supp 11. Neurogenic bowel:  KUB on 11/10 reviewed, showing mild constipation  Discontinued Celebrex as reports of abdominal pain  Change Senna S to plain senna.   Continue miralax.    Added Anusol HC cream for hems.  continue bowel program  12. Neurogenic bladder:  Started Flomax, increased on 11/14  Continue I/O caths, coud caths ordered with [employer]  RX UTI  -begin trial of urecholine 10mg  tid 13. Steroid induced hyperglycemia:Question timing of weaning.  Continue to monitor 14.  Hypoalbuminemia  Supplement initiated on 11/14 15.  Acute lower UTI secondary to Pseudomonas aeruginosa  WBCs 10.0 11/16  Continue Cefepime started on 11/14  LOS: 7 days A FACE TO FACE EVALUATION WAS PERFORMED  Louis Jordan Louis Jordan 11/01/2019, 9:08 AM

## 2019-11-01 NOTE — Progress Notes (Signed)
Education done regarding bowel program and in/out caths with patient and spouse today. Wife will need hands on training. For evening bowel program- dig stim performed while patient in side lying position. Large amount of stool resulted. Suppository inserted about 1845 and 30 minutes after, patient placed on bedside commode. Bowel program completed by on coming nurse.

## 2019-11-01 NOTE — Patient Care Conference (Signed)
Inpatient RehabilitationTeam Conference and Plan of Care Update Date: 11/01/2019   Time: 11:15 AM   Patient Name: Louis Jordan      Medical Record Number: [id number]  Date of Birth: February 06, 1956 Sex: Male         Room/Bed: 4M07C/4M07C-01 Payor Info: Payor: [employer] park manager / Plan: [employer] PPO / Product Type: *No Product type* /    Admit Date/Time:  10/25/2019  7:06 PM  Primary Diagnosis:  Cauda equina syndrome [facility])  Patient Active Problem List   Diagnosis Date Noted  . Acute lower UTI   . Hypoalbuminemia due to protein-calorie malnutrition ([place])   . Acute blood loss anemia   . Postoperative pain   . Neuropathic pain   . Neurogenic bladder 10/26/2019  . Neurogenic bowel 10/26/2019  . Spinal cord injury, thoracic (T7-T12) ([place]) 10/25/2019  . Shoulder dislocation, left, initial encounter   . Closed burst fracture of lumbar vertebra ([place])   . Cauda equina syndrome ([place])   . Closed fracture of left ankle   . Fall from ladder 10/22/2019  . 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 Closed displaced trimalleolar fracture of left ankle 10/22/2019  . S/P lumbar fusion 10/16/2019  . Fracture of lumbar spine without lesion of spinal cord ([place]) 10/15/2019    Expected Discharge Date: Expected Discharge Date: 11/08/19  Team Members Present: Physician leading conference: Dr. Alger Simons Social Worker Present: Lennart Pall, LCSW Nurse Present: Dwaine Gale, RN Case Manager: Karene Fry, RN PT Present: Excell Seltzer, PT OT Present: Amy Rounds, OT SLP Present: Jettie Booze, CF-SLP PPS Coordinator present : Gunnar Fusi, SLP     Current Status/Progress Goal Weekly Team Focus  Bowel/Bladder   neurogenic of bowel and bladder, LBM 11/16. bowel program nightly, I&O cath q3-4 (coude)  Pt will be continent of bowel and bladder  continue bowel and bladder program   Swallow/Nutrition/ Hydration             ADL's   CGA squat pivot transfers or stand pivot with HW, mod A LB  dressing, mod A UB dressing, min A toileting  Supervision overall, may need to modify pending pt progress  ADL re-training, adherence to [place] pre-cautions during functional tasks, functional mobility/transfers, family education and d/c planning   Mobility   CGA overall bed mobility, CGA transfers with HW, Supervision w/c mobility  Supervision overall  transfers, simulation of home environment, LE ROM and strengthening   Communication             Safety/Cognition/ Behavioral Observations            Pain   c/o shoulder and leg pain, medicated with oxycodone, tramadol  pain < or = 4  assess q shift and prn   Skin   surgical incision of back (glue with steri strips), Ace wrap to LLE, dressing and sling immobilizer LUE  remain free of infection and skin remain intact  assess q shift and prn    Rehab Goals Patient on target to meet rehab goals: Yes *See Care Plan and progress notes for long and short-term goals.     Barriers to Discharge  Current Status/Progress Possible Resolutions Date Resolved   Nursing                  PT  Home environment access/layout;Incontinence;Weight bearing restrictions                 OT  SLP                SW                Discharge Planning/Teaching Needs:  Pt to d/c home with wife who can provide 24/7 support.  Teaching needs still TBD   Team Discussion: Mild sensory loss, bowel program after dinner, started urecholine yesterday, not voiding, BM yesterday, BP high, monitoring BP, fell from ladder.  RN - cathed 700cc, not voiding.  OT S lat leans, goals S, will need fam ed for donning brace.  PT CGA overall, wife anxious, needs to learn self cath, considering hiring someone to help with caths, fam ed Thursday with wife.   Revisions to Treatment Plan: N/A     Medical Summary Current Status: cauda equina syndrome, lower ext weakness, neurogenic bowel and bladder Weekly Focus/Goal: see above, BP control, SCI education  Barriers to  Discharge: Neurogenic Bowel & Bladder       Continued Need for Acute Rehabilitation Level of Care: The patient requires daily medical management by a physician with specialized training in physical medicine and rehabilitation for the following reasons: Direction of a multidisciplinary physical rehabilitation program to maximize functional independence : Yes Medical management of patient stability for increased activity during participation in an intensive rehabilitation regime.: Yes Analysis of laboratory values and/or radiology reports with any subsequent need for medication adjustment and/or medical intervention. : Yes   I attest that I was present, lead the team conference, and concur with the assessment and plan of the team.   Trish Mage 11/01/2019, 2:26 PM  Team conference was held via web/ teleconference due to COVID - 19

## 2019-11-01 NOTE — Progress Notes (Signed)
Occupational Therapy Session Note  Patient Details  Name: Louis Jordan MRN: [id number] Date of Birth: [birth year]  Today's Date: 11/01/2019 OT Individual Time: 1300-1400 OT Individual Time Calculation (min): 60 min    Short Term Goals: Week 1:  OT Short Term Goal 1 (Week 1): Pt will instruct caregiver in donning shirt with min cueing OT Short Term Goal 2 (Week 1): Pt will don pants with LRAD with min A OT Short Term Goal 3 (Week 1): Pt will complete toileting tasks with min A  Skilled Therapeutic Interventions/Progress Updates:    Pt received sitting up in w / c with no immediate c/o pain, requesting to wash UB and change shirt. Pt required max A to doff shirt (special shirt with extra buttons from home to accommodate for shoulder immobilizer). Pt does an excellent job of directing caregiver assistance. Pt is additionally very protective of shoulder immobilizer and when elbow ROM was mentioned pt stated surgeon told him this was not allowed. Pt was shown surgeon note that encouraged ROM but he declined. Pt completed UB bathing with min A. Shirt was re-donned with max A. L shoulder kept immobilized entire time. Custom LSO was re-tightened in sitting. Pt completed grooming tasks in sitting with set up assist. Pt requesting to avoid anymore standing 2/2 tiring day of therapy already. Pt propelled w/c with hemi-method (R UE/LE) 200 ft before fatiguing. Pt was taken to [employer]	[place] for change of scenery. Pt returned to his room and was left sitting up with all needs met.    Therapy Documentation Precautions:  Precautions Precautions: Fall, Back Precaution Comments: NWB LUE in sling Required Braces or Orthoses: Sling, Spinal Brace Spinal Brace: Applied in sitting position(pt prefers to don in supine) Spinal Brace Comments: custom LSO Restrictions Weight Bearing Restrictions: Yes LUE Weight Bearing: Non weight bearing LLE Weight Bearing: Non weight bearing  Therapy/Group: Individual  Therapy  Curtis Sites 11/01/2019, 2:42 PM

## 2019-11-01 NOTE — Progress Notes (Signed)
Occupational Therapy Session Note  Patient Details  Name: Louis Jordan MRN: [id number] Date of Birth: 08/22/1956  Today's Date: 11/01/2019 OT Individual Time: 6503-5465 OT Individual Time Calculation (min): 60 min    Short Term Goals: Week 1:  OT Short Term Goal 1 (Week 1): Pt will instruct caregiver in donning shirt with min cueing OT Short Term Goal 2 (Week 1): Pt will don pants with LRAD with min A OT Short Term Goal 3 (Week 1): Pt will complete toileting tasks with min A  Skilled Therapeutic Interventions/Progress Updates:    Pt seen for OT session focusing on functional mobility, ADL re-training and maintaining of NWBing pre-cautions. Pt awake in supine upon arrival, agreeable to tx session and denying pain. LSO donned total A at bed level. Bed placed in flat position with bed rails down in simulation of home environment and bed mobiliy compelted with supervision while therapist donned brace.  Attempte ddonning shoes/socks seated EOB, however, with limited ROM 2/2 back pre-cautions/LSO and limited L UE use, pt unable to reach foot despite multiple positioning attempts. Pt declined use of AE, states he is fine with wife assisting with shoe/sock at d/c. He transferred to sitting EOB with supervision. COmpleted CGA stand pivot transfer to w/c using hemi-walker.  Grooming tasks completed from w/c level at sink with setup. He self propelled w/c throughout unit using hemi-technique with supervision.  Completed dynamic standing activities with "beep board" placed under pt's L LE as multi-modal feedback of adherence to [place] pre-cautions. Dynamic standing balance with CGA overall, responded well to beep board and completed multiple standing trials without breaking pre-cautions. Standing horseshoe toss with CGA, maintaining pre-cautions and tolerating dynamic standing ~1 minute before requiring seated rest break, completed x3 trials with seated rest break btwn trials. Pt returned to room at end of  session, left seated in w/c with all needs in reach.   Therapy Documentation Precautions:  Precautions Precautions: Fall, Back Precaution Comments: NWB LUE in sling Required Braces or Orthoses: Sling, Spinal Brace Spinal Brace: Applied in sitting position(pt prefers to don in supine) Spinal Brace Comments: custom LSO Restrictions Weight Bearing Restrictions: Yes LUE Weight Bearing: Non weight bearing LLE Weight Bearing: Non weight bearing   Therapy/Group: Individual Therapy  Thatiana Renbarger L 11/01/2019, 6:53 AM

## 2019-11-02 ENCOUNTER — Inpatient Hospital Stay (HOSPITAL_COMMUNITY): Payer: Commercial Managed Care - PPO | Admitting: Physical Therapy

## 2019-11-02 ENCOUNTER — Inpatient Hospital Stay (HOSPITAL_COMMUNITY): Payer: Commercial Managed Care - PPO | Admitting: *Deleted

## 2019-11-02 ENCOUNTER — Inpatient Hospital Stay (HOSPITAL_COMMUNITY): Payer: Commercial Managed Care - PPO | Admitting: Occupational Therapy

## 2019-11-02 ENCOUNTER — Inpatient Hospital Stay (HOSPITAL_COMMUNITY): Payer: Commercial Managed Care - PPO

## 2019-11-02 NOTE — Plan of Care (Signed)
  Problem: RH Dressing Goal: LTG Patient will perform lower body dressing w/assist (OT) Description: LTG: Patient will perform lower body dressing with assist, with/without cues in positioning using equipment (OT) Flowsheets (Taken 11/02/2019 0757) LTG: Pt will perform lower body dressing with assistance level of: (Pants only) -- Note: Supervision for pants only. Keston Seever, OTR/L   Problem: RH Tub/Shower Transfers Goal: LTG Patient will perform tub/shower transfers w/assist (OT) Description: LTG: Patient will perform tub/shower transfers with assist, with/without cues using equipment (OT) Outcome: Not Applicable Flowsheets (Taken 11/02/2019 0716) LTG: Pt will perform tub/shower stall transfers with assistance level of: (goal d/c- AR) -- Note: Goal d/c as not appropriate for shower at this time. Jaxyn Mestas, OTR/L

## 2019-11-02 NOTE — Plan of Care (Signed)
Discharging gait goal and stair goal due to decreased safety with attempting gait and/or stairs at this time.

## 2019-11-02 NOTE — Progress Notes (Signed)
Physical Therapy Weekly Progress Note  Patient Details  Name: Louis Jordan MRN: [id number] Date of Birth: June 19, 1956  Beginning of progress report period: October 26, 2019 End of progress report period: November 02, 2019  Today's Date: 11/02/2019 PT Individual Time: 1000-1100 PT Individual Time Calculation (min): 60 min   Patient has met 2 of 3 short term goals. Discharged gait and stair goal due to decreased safety with attempting this. Pt is making great progress towards therapy goals otherwise and is at Supervision level for bed mobility, CGA for squat pivot or stand pivot transfers with use of hemiwalker. Pt is at Supervision level for w/c mobility and overall exhibits improved safety and awareness during transfers. Pt demonstrates good adherence to WBing precautions and good insight into current deficits.  Patient continues to demonstrate the following deficits muscle weakness and decreased standing balance, decreased postural control and decreased balance strategies and therefore will continue to benefit from skilled PT intervention to increase functional independence with mobility.  Patient progressing toward long term goals..  Plan of care revisions: Discharged short distance gait goal and stair goal as they are unsafe to attempt at this time due to current WBing precautions..  PT Short Term Goals Week 1:  PT Short Term Goal 1 (Week 1): Pt will complete least restrictive transfer with min A PT Short Term Goal 1 - Progress (Week 1): Met PT Short Term Goal 2 (Week 1): Pt will initiate gait training PT Short Term Goal 2 - Progress (Week 1): Discontinued (comment)(unsafe to attempt at this time) PT Short Term Goal 3 (Week 1): Pt will tolerate sitting up out of bed x 2 hours PT Short Term Goal 3 - Progress (Week 1): Met Week 2:  PT Short Term Goal 1 (Week 2): =LTG due to ELOS  Skilled Therapeutic Interventions/Progress Updates:    Pt received seated in w/c in room, agreeable to PT  session. No complaints of pain. Per pt report he has found a small ramp that he can use to bridge the one step he has to enter his home. Manual w/c propulsion x 150 ft with use of R UE/LE at Supervision level. Pt only requires assist with removing L leg rest due to precautions but otherwise can manage w/c parts independently. Stand pivot transfer w/c to/from bed in simulation apartment with close SBA to CGA with use of hemiwalker, focus on management of HW on carpet. Sit to/from supine on real bed with Supervision. Supine BLE strengthening therex with 4# ankle weight on RLE: heel slides, hip abd, SAQ x 12 reps each to fatigue. Supine to sidelying on R side with Supervision. Sidelying LLE hip abd and clamshells x 12 reps each to fatigue. Squat pivot transfer back to w/c then back to bed with CGA. Sit to supine Supervision. Pt left supine in bed in care of RN and NT for I/O cath.  Therapy Documentation Precautions:  Precautions Precautions: Fall, Back Precaution Comments: NWB LUE in sling Required Braces or Orthoses: Sling, Spinal Brace Spinal Brace: Applied in sitting position(pt prefers to don in supine) Spinal Brace Comments: custom LSO Restrictions Weight Bearing Restrictions: Yes LUE Weight Bearing: Non weight bearing LLE Weight Bearing: Non weight bearing   Therapy/Group: Individual Therapy   Excell Seltzer, PT, DPT  11/02/2019, 12:39 PM

## 2019-11-02 NOTE — Progress Notes (Signed)
Occupational Therapy Weekly Progress Note  Patient Details  Name: Louis Jordan MRN: [id number] Date of Birth: 07/10/1956  Beginning of progress report period: October 26, 2019 End of progress report period: November 02, 2019  Today's Date: 11/02/2019 OT Individual Time: 8563-1497 OT Individual Time Calculation (min): 75 min    Patient has met 3 of 3 short term goals.  Pt making steady progress towards OT goals. He is completing squat pivot transfers with supervision, CGA stand pivot with hemi-walker. He requires VCs for maintaining of L NWB pre-cautions. He completes lateral leans for clothing management during LB dressing tasks and toileting. He requires max-total A for UB dressing with modified shirt and LSO brace. Plans for family education session with pt's wife scheduled for end of this week with plan for d/c home at beginning of next week.   Patient continues to demonstrate the following deficits: muscle weakness and muscle paralysis, decreased cardiorespiratoy endurance and decreased sitting balance, decreased standing balance, decreased postural control, decreased balance strategies and difficulty maintaining precautions and therefore will continue to benefit from skilled OT intervention to enhance overall performance with BADL and Reduce care partner burden.  Patient progressing toward long term goals..  Plan of care revisions: Tub/shower transfer goal d/c as pt not medically appropriate at this time. LB dressing goal modified to consist of pants only at supervision level, requires total A for donning R footwear. .  OT Short Term Goals Week 1:  OT Short Term Goal 1 (Week 1): Pt will instruct caregiver in donning shirt with min cueing OT Short Term Goal 1 - Progress (Week 1): Met OT Short Term Goal 2 (Week 1): Pt will don pants with LRAD with min A OT Short Term Goal 2 - Progress (Week 1): Met OT Short Term Goal 3 (Week 1): Pt will complete toileting tasks with min A OT Short Term Goal  3 - Progress (Week 1): Met Week 2:  OT Short Term Goal 1 (Week 2): STG=LTG due to LOS  Skilled Therapeutic Interventions/Progress Updates:    Pt seen for OT session focusing on ADL re-training and UE ROM Pt awake in supine upon arrival, agreeable to tx session and denying pain. Pt spoke of success of last nights bowel program including getting up to [facility] following dig.stim. He transferred to sitting EOB with supervision with use of bed rail from flat bed. LSO donned with max A seated EOB, pt directing care.  Completed min A stand pivot transfer to w/c using hemi-walker. Grooming tasks completed from w/c level at sink with set-up.  He self-propelled w/c throughout unit mod I using hemi-technique. Transferred to supine on mat with CGA. Performed AROM at L elbow only per MD order. Pt tolerated well, inittially only ~70% full elbow extension, however, following gentle ROM and light massage at bicep, pt able to achieve full elbow flexion and extension. No complaints of pain. Education provdied regarding importance of ROM within MD orders and functional implications. Min A squat pivot transfer back to w/c. Pt returned to room at end of session, left seated in w/c with all needs in reach.   Therapy Documentation Precautions:  Precautions Precautions: Fall, Back Precaution Comments: NWB LUE in sling Required Braces or Orthoses: Sling, Spinal Brace Spinal Brace: Applied in sitting position(pt prefers to don in supine) Spinal Brace Comments: custom LSO Restrictions Weight Bearing Restrictions: Yes LUE Weight Bearing: Non weight bearing LLE Weight Bearing: Non weight bearing   Therapy/Group: Individual Therapy  Louis Jordan L 11/02/2019, 6:56 AM

## 2019-11-02 NOTE — Progress Notes (Signed)
Patient's spouse given explanation and watched cathing procedure and verbalized willingness to attempt herself tomorrow. Patient confirms that he prefers foley-style coude catheter like hospital uses for home use.

## 2019-11-02 NOTE — Plan of Care (Signed)
  Problem: RH Tub/Shower Transfers Goal: LTG Patient will perform tub/shower transfers w/assist (OT) Description: LTG: Patient will perform tub/shower transfers with assist, with/without cues using equipment (OT) Outcome: Not Applicable Flowsheets (Taken 11/02/2019 0716) LTG: Pt will perform tub/shower stall transfers with assistance level of: (goal d/c- AR) -- Note: Goal d/c as not appropriate for shower at this time. Lyndel Sarate, OTR/L

## 2019-11-02 NOTE — Progress Notes (Signed)
Physical Therapy Session Note  Patient Details  Name: Louis Jordan MRN: [id number] Date of Birth: 1956-07-18  Today's Date: 11/02/2019 PT Individual Time: 1300-1420 PT Individual Time Calculation (min): 80 min   Short Term Goals:  Week 2:  PT Short Term Goal 1 (Week 2): =LTG due to ELOS  Skilled Therapeutic Interventions/Progress Updates:   Pt resting in bed.  He reported some tightness in his abdomen, but not pain. He received meds at start of session.   neuromuscular re-education via demo and multimodal cues: supine- 10 x 1 R/L straight leg raises, 15 x 1 bil hip internal rotation, resisted R ankle DF/PF, resisted R mass extension, resisted R hip abduction/adduction in R knee flexion, L heel slides, L hip abduction/adduction, bil glut sets, R scapular protraction.  In supine with RLE off of bed: 15 x 1 heel toe/toe raises, hip flexion.  In R side lying, L hip abduction with flexed hips and knees,  hip extension via isolated gluteal activation with flexed knee.  Pt has difficulty isolating L gluteals for hip extension.   PT donned LSO on pt in supine.  He directed care.  Supine> sit in flat bed with rail, with supervision.  Stand pivot from raised bed, using HW with close supervision. Pt rolled w/c to sink to brush teeth, wash face and upper body with set up.   Discussed relaxation methods.  He reports reading Bible to relax.  PT instructed pt in diapragmatic breathing with relaxation imagery, with good carry-over.   At end of session, pt seated in w/c, with needs at hand.   Therapy Documentation Precautions:  Precautions Precautions: Fall, Back Precaution Comments: NWB LUE in sling Required Braces or Orthoses: Sling, Spinal Brace Spinal Brace: Applied in sitting position(pt prefers to don in supine) Spinal Brace Comments: custom LSO Restrictions Weight Bearing Restrictions: Yes LUE Weight Bearing: Non weight bearing LLE Weight Bearing: Non weight bearing    Therapy/Group:  Individual Therapy  Morrisa Aldaba 11/02/2019, 2:46 PM

## 2019-11-02 NOTE — Progress Notes (Signed)
[facility] PHYSICAL MEDICINE & REHABILITATION PROGRESS NOTE   Subjective/Complaints: No new complaints. Happy that he's moving better.  Loves his nurse and therapy team, very optimistic. Also notes that the tingling in his lower extremities has improved. Denies constipation.  ROS: Patient denies fever, rash, sore throat, blurred vision, nausea, vomiting, diarrhea, cough, shortness of breath or chest pain,  headache, or mood change.    Objective:   No results found. Recent Labs    10/31/19 0519  WBC 10.0  HGB 10.8*  HCT 31.8*  PLT 400   Recent Labs    10/31/19 0519  NA 134*  K 4.3  CL 96*  CO2 28  GLUCOSE 120*  BUN 23  CREATININE 0.75  CALCIUM 8.6*    Intake/Output Summary (Last 24 hours) at 11/02/2019 1029 Last data filed at 11/02/2019 0730 Gross per 24 hour  Intake 342 ml  Output 2875 ml  Net -2533 ml     Physical Exam: Vital Signs Blood pressure 132/75, pulse (!) 54, temperature 98.6 F (37 C), resp. rate 18, height 6' (1.829 m), weight 60.2 kg, SpO2 97 %.  Constitutional: No distress . Vital signs reviewed. Sitting upright in wheelchair using grabber.  HEENT: EOMI, oral membranes moist Neck: supple Cardiovascular: RRR without murmur. No JVD    Respiratory: CTA Bilaterally without wheezes or rales. Normal effort    GI: BS +, non-tender, non-distended  Skin: Left ankle with dressing C/D/I intact. Skin warm Psych: Normal mood.  Normal behavior. Musc: Left ankle with edema Neuro: Alert Right upper extremities 5/5 proximal distal Left upper extremity: Proximally limited by sling, hand grip 5/5 Right lower extremity: 5/5 proximal distal Left lower extremity: Hip flexion, knee extension 5/5, ankle braced, stable motor ?mild sensory loss over 5th toes bilaterally  Assessment/Plan: 1. Functional deficits secondary to Cauda Equina syndrome due to L1 burst fx, and polytrauma causing L trimalleolar fx and L shoulder dislocation and labral tear which require 3+  hours per day of interdisciplinary therapy in a comprehensive inpatient rehab setting.  Physiatrist is providing close team supervision and 24 hour management of active medical problems listed below.  Physiatrist and rehab team continue to assess barriers to discharge/monitor patient progress toward functional and medical goals  Care Tool:  Bathing  Bathing activity did not occur: Safety/medical concerns Body parts bathed by patient: Right arm, Left arm, Chest, Abdomen   Body parts bathed by helper: Buttocks(back)     Bathing assist Assist Level: Minimal Assistance - Patient > 75%     Upper Body Dressing/Undressing Upper body dressing   What is the patient wearing?: Pull over shirt    Upper body assist Assist Level: Maximal Assistance - Patient 25 - 49%    Lower Body Dressing/Undressing Lower body dressing      What is the patient wearing?: Underwear/pull up(short, ace wrap/soft cast to left leg lower leg)     Lower body assist Assist for lower body dressing: Moderate Assistance - Patient 50 - 74%     Toileting Toileting    Toileting assist Assist for toileting: Contact Guard/Touching assist     Transfers Chair/bed transfer  Transfers assist     Chair/bed transfer assist level: Contact Guard/Touching assist     Locomotion Ambulation   Ambulation assist   Ambulation activity did not occur: Safety/medical concerns          Walk 10 feet activity   Assist  Walk 10 feet activity did not occur: Safety/medical concerns  Walk 50 feet activity   Assist Walk 50 feet with 2 turns activity did not occur: Safety/medical concerns         Walk 150 feet activity   Assist Walk 150 feet activity did not occur: Safety/medical concerns         Walk 10 feet on uneven surface  activity   Assist Walk 10 feet on uneven surfaces activity did not occur: Safety/medical concerns         Wheelchair     Assist Will patient use wheelchair  at discharge?: Yes Type of Wheelchair: Manual    Wheelchair assist level: Supervision/Verbal cueing Max wheelchair distance: 150'    Wheelchair 50 feet with 2 turns activity    Assist        Assist Level: Supervision/Verbal cueing   Wheelchair 150 feet activity     Assist      Assist Level: Supervision/Verbal cueing   Blood pressure 132/75, pulse (!) 54, temperature 98.6 F (37 C), resp. rate 18, height 6' (1.829 m), weight 60.2 kg, SpO2 97 %.  Medical Problem List and Plan: 1.Impaired mobility/ADLs/Functionsecondary to Cauda equina syndrome, L shoulder dislocation s/p repair (very unstable L shoulder) NWB x 6 weeks, and L trimalleolar fx s/p ORIF NWB 6+ weeks with neurogenic bowel and bladder.  Cont CIR PT OT 2. Antithrombotics: -DVT/anticoagulation:Pharmaceutical:Lovenox -antiplatelet therapy: N/A 3. Pain Management: Post op pain and SCI AT LEVEL pain  On tylenol 1000 mg qid, ultram 25 mg qid, robaxin 500 mg qid, and Oxycodone prn.  Started gabapentin for nerve pain, increased on 11/12, tingling improved on 11/17.   OxyContin 50 twice daily started on 11/13  Improving on 11/16 4. Mood:LCSW to follow for evaluation and support -antipsychotic agents: N/A 5. Neuropsych: This patientiscapable of making decisions onhisown behalf. 6. Skin/Wound Care:Routine pressure relief measures. 7. Fluids/Electrolytes/Nutrition:Monitor I/O.  8. Left shoulder dislocation s/p arthroscopy: Sling with NWB X 5 more weeks. 9. ORIF Left trimalleolar ankle fracture: NWB LLE 10 ABLA: Continue to monitor..  Hemoglobin 12.0 on 11/11--->10.8 today, no signs of blood loss  Fe++ supp 11. Neurogenic bowel:  KUB on 11/10 reviewed, showing mild constipation  Discontinued Celebrex as reports of abdominal pain  Change Senna S to plain senna.   Continue miralax.   Added Anusol HC cream for hems.  continue bowel program  12. Neurogenic  bladder:  Started Flomax, increased on 11/14  Continue I/O caths, coud caths ordered with Urojet  RX UTI  -begin trial of urecholine  tid 13. Steroid induced hyperglycemia:Question timing of weaning.  Continue to monitor 14.  Hypoalbuminemia  Supplement initiated on 11/14 15.  Acute lower UTI secondary to Pseudomonas aeruginosa  WBCs 10.0 11/16  Continue Cefepime started on 11/14  LOS: 8 days A FACE TO FACE EVALUATION WAS PERFORMED  Clint Bolder P Pallie Swigert 11/02/2019, 10:29 AM    [facility] PHYSICAL MEDICINE & REHABILITATION PROGRESS NOTE   Subjective/Complaints: No new complaints. Happy that he's moving better.  States that last night he had his best night of sleep. Also notes that the tingling in his lower extremities has improved. Denies constipation.  ROS: Patient denies fever, rash, sore throat, blurred vision, nausea, vomiting, diarrhea, cough, shortness of breath or chest pain,  headache, or mood change.    Objective:   No results found. Recent Labs    10/31/19 0519  WBC 10.0  HGB 10.8*  HCT 31.8*  PLT 400   Recent Labs    10/31/19 0519  NA 134*  K 4.3  CL 96*  CO2 28  GLUCOSE 120*  BUN 23  CREATININE 0.75  CALCIUM 8.6*    Intake/Output Summary (Last 24 hours) at 11/02/2019 1030 Last data filed at 11/02/2019 0730 Gross per 24 hour  Intake 342 ml  Output 2875 ml  Net -2533 ml     Physical Exam: Vital Signs Blood pressure 132/75, pulse (!) 54, temperature 98.6 F (37 C), resp. rate 18, height 6' (1.829 m), weight 60.2 kg, SpO2 97 %.  Constitutional: No distress . Vital signs reviewed. Sitting upright in wheelchair using grabber.  HEENT: EOMI, oral membranes moist Neck: supple Cardiovascular: RRR without murmur. No JVD    Respiratory: CTA Bilaterally without wheezes or rales. Normal effort    GI: BS +, non-tender, non-distended  Skin: Left ankle with dressing C/D/I intact. Skin warm Psych: Normal mood.  Normal behavior. Musc: Left  ankle with edema and tenderness Neuro: Alert Right upper extremities 5/5 proximal distal Left upper extremity: Proximally limited by sling, hand grip 5/5 Right lower extremity: 5/5 proximal distal Left lower extremity: Hip flexion, knee extension 4+/5, ankle braced, stable motor ?mild sensory loss over 5th toes bilaterally  Assessment/Plan: 1. 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  Physiatrist is providing close team supervision and 24 hour management of active medical problems listed below.  Physiatrist and rehab team continue to assess barriers to discharge/monitor patient progress toward functional and medical goals  Care Tool:  Bathing  Bathing activity did not occur: Safety/medical concerns Body parts bathed by patient: Right arm, Left arm, Chest, Abdomen   Body parts bathed by helper: Buttocks(back)     Bathing assist Assist Level: Minimal Assistance - Patient > 75%     Upper Body Dressing/Undressing Upper body dressing   What is the patient wearing?: Pull over shirt    Upper body assist Assist Level: Maximal Assistance - Patient 25 - 49%    Lower Body Dressing/Undressing Lower body dressing      What is the patient wearing?: Underwear/pull up(short, ace wrap/soft cast to left leg lower leg)     Lower body assist Assist for lower body dressing: Moderate Assistance - Patient 50 - 74%     Toileting Toileting    Toileting assist Assist for toileting: Contact Guard/Touching assist     Transfers Chair/bed transfer  Transfers assist     Chair/bed transfer assist level: Contact Guard/Touching assist     Locomotion Ambulation   Ambulation assist   Ambulation activity did not occur: Safety/medical concerns          Walk 10 feet  activity   Assist  Walk 10 feet activity did not occur: Safety/medical concerns        Walk 50 feet activity   Assist Walk 50 feet with 2 turns activity did not occur: Safety/medical concerns         Walk 150 feet activity   Assist Walk 150 feet activity did not occur: Safety/medical concerns         Walk 10 feet on uneven surface  activity   Assist Walk 10 feet on uneven surfaces activity did not occur: Safety/medical concerns         Wheelchair     Assist Will patient use wheelchair at discharge?: Yes Type of Wheelchair: Manual    Wheelchair assist level:  Supervision/Verbal cueing Max wheelchair distance: 150'    Wheelchair 50 feet with 2 turns activity    Assist        Assist Level: Supervision/Verbal cueing   Wheelchair 150 feet activity     Assist      Assist Level: Supervision/Verbal cueing   Blood pressure 132/75, pulse (!) 54, temperature 98.6 F (37 C), resp. rate 18, height 6' (1.829 m), weight 60.2 kg, SpO2 97 %.  Medical Problem List and Plan: 1.Impaired mobility/ADLs/Functionsecondary to Cauda equina syndrome, L shoulder dislocation s/p repair (very unstable L shoulder) NWB x 6 weeks, and L trimalleolar fx s/p ORIF NWB 6+ weeks with neurogenic bowel and bladder.  Cont CIR PT OT 2. Antithrombotics: -DVT/anticoagulation:Pharmaceutical:Lovenox -antiplatelet therapy: N/A 3. Pain Management: Post op pain and SCI AT LEVEL pain  On tylenol 1000 mg qid, ultram 25 mg qid, robaxin 500 mg qid, and Oxycodone prn.  Started gabapentin for nerve pain, increased on 11/12, tingling improved on 11/17.   OxyContin 50 twice daily started on 11/13  Improving on 11/16, 11/18 4. Mood:LCSW to follow for evaluation and support -antipsychotic agents: N/A 5. Neuropsych: This patientiscapable of making decisions onhisown behalf. 6. Skin/Wound Care:Routine pressure relief measures. 7.  Fluids/Electrolytes/Nutrition:Monitor I/O.  8. Left shoulder dislocation s/p arthroscopy: Sling with NWB X 5 more weeks. 9. ORIF Left trimalleolar ankle fracture: NWB LLE 10 ABLA: Continue to monitor..  Hemoglobin 12.0 on 11/11--->10.8 today, no signs of blood loss  Fe++ supp 11. Neurogenic bowel:  KUB on 11/10 reviewed, showing mild constipation  Discontinued Celebrex as reports of abdominal pain  Change Senna S to plain senna.   Continue miralax.   Added Anusol HC cream for hems.  continue bowel program  12. Neurogenic bladder:  Started Flomax, increased on 11/14  Continue I/O caths, coud caths ordered with Urojet  RX UTI  -begin trial of urecholine 10mg  tid 13. Steroid induced hyperglycemia:Question timing of weaning.  Continue to monitor 14.  Hypoalbuminemia  Supplement initiated on 11/14 15.  Acute lower UTI secondary to Pseudomonas aeruginosa  WBCs 10.0 11/16  Continue Cefepime started on 11/14  LOS: 8 days A FACE TO FACE EVALUATION WAS PERFORMED  Aslyn Cottman P Shameer Molstad 11/02/2019, 10:30 AM

## 2019-11-03 ENCOUNTER — Inpatient Hospital Stay (HOSPITAL_COMMUNITY): Payer: Commercial Managed Care - PPO | Admitting: *Deleted

## 2019-11-03 ENCOUNTER — Ambulatory Visit (HOSPITAL_COMMUNITY): Payer: Commercial Managed Care - PPO | Admitting: Physical Therapy

## 2019-11-03 ENCOUNTER — Inpatient Hospital Stay (HOSPITAL_COMMUNITY): Payer: Commercial Managed Care - PPO

## 2019-11-03 ENCOUNTER — Encounter (HOSPITAL_COMMUNITY): Payer: Commercial Managed Care - PPO | Admitting: Occupational Therapy

## 2019-11-03 NOTE — Progress Notes (Signed)
[place] PHYSICAL MEDICINE & REHABILITATION PROGRESS NOTE   Subjective/Complaints:  Discussed with pt concerns over bowel program- taking a long time; taking 2-3 hours per pt to complete sometimes- also cath volumes at night are high 700-1000cc, but during day it's low (probably because storing in legs, so needs to get back in bed between therapies).  Also asked PT/OT to make sure time is scheduled for q4 hours caths. D/w Pamala Hurry PT-    ROS: Patient denies fever, rash, sore throat, blurred vision, nausea, vomiting, diarrhea, cough, shortness of breath or chest pain,  headache, or mood change.    Objective:   No results found. No results for input(s): WBC, HGB, HCT, PLT in the last 72 hours. No results for input(s): NA, K, CL, CO2, GLUCOSE, BUN, CREATININE, CALCIUM in the last 72 hours.  Intake/Output Summary (Last 24 hours) at 11/03/2019 1007 Last data filed at 11/03/2019 0821 Gross per 24 hour  Intake 360 ml  Output 2700 ml  Net -2340 ml     Physical Exam: Vital Signs Blood pressure 135/72, pulse (!) 55, temperature 97.7 F (36.5 C), temperature source Oral, resp. rate 18, height 6' (1.829 m), weight 60.2 kg, SpO2 98 %.  Constitutional: No distress . Vital signs reviewed. Lying on R side in bed; PT at bedside, NAD; wearing sling on L shoulder; arm immobilized HEENT: EOMI, oral membranes moist Neck: supple Cardiovascular: RRR without murmur. No JVD    Respiratory: CTA Bilaterally without wheezes or rales. Normal effort    GI: BS +, non-tender, non-distended  Skin: Left ankle with dressing C/D/I intact. Skin warm Psych: Normal mood.  Normal behavior. Musc: Left ankle with edema Neuro: Alert Right upper extremities 5/5 proximal distal Left upper extremity: Proximally limited by sling, hand grip 5/5 Right lower extremity: 5/5 proximal distal Left lower extremity: Hip flexion, knee extension 5/5, ankle braced, stable motor ?mild sensory loss over 5th toes  bilaterally  Assessment/Plan: 1. 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  Physiatrist is providing close team supervision and 24 hour management of active medical problems listed below.  Physiatrist and rehab team continue to assess barriers to discharge/monitor patient progress toward functional and medical goals  Care Tool:  Bathing  Bathing activity did not occur: Safety/medical concerns Body parts bathed by patient: Right arm, Left arm, Chest, Abdomen   Body parts bathed by helper: Buttocks(back)     Bathing assist Assist Level: Minimal Assistance - Patient > 75%     Upper Body Dressing/Undressing Upper body dressing   What is the patient wearing?: Pull over shirt    Upper body assist Assist Level: Maximal Assistance - Patient 25 - 49%    Lower Body Dressing/Undressing Lower body dressing      What is the patient wearing?: Underwear/pull up(short, ace wrap/soft cast to left leg lower leg)     Lower body assist Assist for lower body dressing: Moderate Assistance - Patient 50 - 74%     Toileting Toileting    Toileting assist Assist for toileting: Contact Guard/Touching assist     Transfers Chair/bed transfer  Transfers assist     Chair/bed transfer assist level: Contact Guard/Touching assist     Locomotion Ambulation   Ambulation assist   Ambulation activity did not occur: Safety/medical concerns  Walk 10 feet activity   Assist  Walk 10 feet activity did not occur: Safety/medical concerns        Walk 50 feet activity   Assist Walk 50 feet with 2 turns activity did not occur: Safety/medical concerns         Walk 150 feet activity   Assist Walk 150 feet activity did not occur: Safety/medical concerns          Walk 10 feet on uneven surface  activity   Assist Walk 10 feet on uneven surfaces activity did not occur: Safety/medical concerns         Wheelchair     Assist Will patient use wheelchair at discharge?: Yes Type of Wheelchair: Manual    Wheelchair assist level: Supervision/Verbal cueing Max wheelchair distance: 150'    Wheelchair 50 feet with 2 turns activity    Assist        Assist Level: Supervision/Verbal cueing   Wheelchair 150 feet activity     Assist      Assist Level: Supervision/Verbal cueing   Blood pressure 135/72, pulse (!) 55, temperature 97.7 F (36.5 C), temperature source Oral, resp. rate 18, height 6' (1.829 m), weight 60.2 kg, SpO2 98 %.  Medical Problem List and Plan: 1.Impaired mobility/ADLs/Functionsecondary to Cauda equina syndrome, L shoulder dislocation s/p repair (very unstable L shoulder) NWB x 6 weeks, and L trimalleolar fx s/p ORIF NWB 6+ weeks with neurogenic bowel and bladder.  Cont CIR PT OT 2. Antithrombotics: -DVT/anticoagulation:Pharmaceutical:Lovenox -antiplatelet therapy: N/A 3. Pain Management: Post op pain and SCI AT LEVEL pain  On tylenol 1000 mg qid, ultram 25 mg qid, robaxin 500 mg qid, and Oxycodone prn.  Started gabapentin for nerve pain, increased on 11/12, tingling improved on 11/17.   OxyContin 10 twice daily started on 11/13  Improving on 11/16 4. Mood:LCSW to follow for evaluation and support -antipsychotic agents: N/A 5. Neuropsych: This patientiscapable of making decisions onhisown behalf. 6. Skin/Wound Care:Routine pressure relief measures. 7. Fluids/Electrolytes/Nutrition:Monitor I/O.  8. Left shoulder dislocation s/p arthroscopy: Sling with NWB X 5 more weeks. 9. ORIF Left trimalleolar ankle fracture: NWB LLE 10 ABLA: Continue to monitor..  Hemoglobin 12.0 on 11/11--->10.8 today, no signs of blood loss  Fe++ supp 11. Neurogenic bowel:  KUB  on 11/10 reviewed, showing mild constipation  Discontinued Celebrex as reports of abdominal pain  Change Senna S to plain senna.   Continue miralax.   Added Anusol HC cream for hems.  continue bowel program   11/19- discussed bowel program with day and night nurse to make sure has good passoff 12. Neurogenic bladder:  Started Flomax, increased on 11/14  Continue I/O caths, coud caths ordered with [employer]  RX UTI  -begin trial of urecholine 10mg  tid  11/19- needs cath q4 hours- lay in bed between therapies to help night time dumps of urine/caths levels too high- pt drinking ~ 1500cc/day, but also getting IV ABX which increases fluid levels. 13. Steroid induced hyperglycemia:Question timing of weaning.  Continue to monitor 14.  Hypoalbuminemia  Supplement initiated on 11/14 15.  Acute lower UTI secondary to Pseudomonas aeruginosa  WBCs 10.0 11/16  Continue Cefepime started on 11/14- til 11/21 since complicated UTI from in/out caths/neurogenic bladder 16. Sexual/intimacy issues- will d/w with pt and wife PRIOR to d/c.  LOS: 9 days A FACE TO FACE EVALUATION WAS PERFORMED  Heli Dino 11/03/2019, 10:07 AM

## 2019-11-03 NOTE — Progress Notes (Signed)
Occupational Therapy Session Note  Patient Details  Name: Louis Jordan MRN: [id number] Date of Birth: August 13, 1956  Today's Date: 11/03/2019 OT Individual Time: 1400-1500 OT Individual Time Calculation (min): 60 min    Short Term Goals: Week 2:  OT Short Term Goal 1 (Week 2): STG=LTG due to LOS  Skilled Therapeutic Interventions/Progress Updates:    Pt in supine upon arrival with hand off from PT. Caregiver education with pt's wife this session.  Education and hands on training completed on the following:  L UE AROM of elbow from supine position  Donning on L sling following elbow mobilzation  Supervision-CGA Transfers including:   Supine>sitting EOB   Squat pivot transfers: EOB>w/c and w/c>drop arm BSC   Stand pivot transfer BSC>w/c with hemi walker  UB bathing/dressing with modified techniques due to pre-cautions and LSO and sling  Toileting task Pt able to assist in directing care with all tasks. [name] voiced understanding with all information provided regarding ADLs. Pt left sitting up in w/c at sink with wife present.     Therapy Documentation Precautions:  Precautions Precautions: Fall, Back Precaution Comments: NWB LUE in sling Required Braces or Orthoses: Sling, Spinal Brace Spinal Brace: Applied in sitting position(pt prefers to don in supine) Spinal Brace Comments: custom LSO Restrictions Weight Bearing Restrictions: Yes LUE Weight Bearing: Non weight bearing LLE Weight Bearing: Non weight bearing   Therapy/Group: Individual Therapy  [name] L 11/03/2019, 6:55 AM

## 2019-11-03 NOTE — Progress Notes (Signed)
  Patient ID: Louis Jordan, male   DOB: 07-14-1956, 63 y.o.   MRN: [id number]    Diagnosis codes:  S32.011D;  S43.015D;  G83.4;  S82.852D  Height:    6'            Weight:   145 lbs         Patient suffers from an L1 burst fx, cauda equina syndrome, ankle fx and left shoulder dislocation which impairs his ability to perform daily activities like bathing, dressing and mobility in the home.  A walker will not resolve issue with performing activities of daily living.  A wheelchair will allow patient to safely perform daily activities.  Patient is not able to propel themselves in the home using a standard weight wheelchair due to restricted movement of left upper and lower extremities and significant weakness.  Patient can self propel in the lightweight wheelchair.  Reesa Chew, PA-C

## 2019-11-03 NOTE — Plan of Care (Signed)
  Problem: SCI BLADDER ELIMINATION Goal: RH STG MANAGE BLADDER WITH ASSISTANCE Description: STG Manage Bladder With min Assistance Outcome: Not Progressing; bowel program   Problem: SCI BOWEL ELIMINATION Goal: RH STG MANAGE BOWEL WITH ASSISTANCE Description: STG Manage Bowel with mod I Assistance. Outcome: Not Progressing; in and out cath

## 2019-11-03 NOTE — Progress Notes (Addendum)
RN did caude cath education with wife. RN guided wife re: caude cath insertion using the clean technique. Wife demonstrated caude cath insertion with RN assisting. . Wife did  good she  needs more practice but basics was covered. RN educated wife re: bowel program as well.

## 2019-11-03 NOTE — Progress Notes (Signed)
Physical Therapy Session Note  Patient Details  Name: Louis Jordan MRN: [id number] Date of Birth: 1956-05-06  Today's Date: 11/03/2019 PT Individual Time: 8144-8185 PT Individual Time Calculation (min): 90 min   Short Term Goals: Week 1:  PT Short Term Goal 1 (Week 1): Pt will complete least restrictive transfer with min A PT Short Term Goal 1 - Progress (Week 1): Met PT Short Term Goal 2 (Week 1): Pt will initiate gait training PT Short Term Goal 2 - Progress (Week 1): Discontinued (comment)(unsafe to attempt at this time) PT Short Term Goal 3 (Week 1): Pt will tolerate sitting up out of bed x 2 hours PT Short Term Goal 3 - Progress (Week 1): Met Week 2:  PT Short Term Goal 1 (Week 2): =LTG due to ELOS Week 3:     Skilled Therapeutic Interventions/Progress Updates:     PAIN 3/10 ant ankle L, treatment to tolerance, pt requested pain meds from nursing.   Spoke w/pt and MD and discussed pt having need for minimum of 10 min supine time during all therapy sessions and return to bed between sessions due to fluid retention causing volume changes/large volumes cath fluctuating w/small volumes.  Agreed to communicate this w/team.  Pt initially supine.  Rolls to [employer] I w/rail for donning of LSO which he verbally directs.  Supine to side to sit w/rail and min assist. Pt Instructed w/squat pvt transfer bed to wc to bed.  Repeated x 2 w/therapist monitoring NWB status on L which initially he required min assist to maintain when performing to his L, but this improved w/repitition.  Initially pt requires additional time for task but this also improved w/reps.  wc proplusion 163f mod I to gym.    STSin parallel bars w/therapist assisting LLE to ensure NWB w/transition, cues for sequencing.  In standing performed the following LLE therex: Hip flexion x12, hip abd x 12, hip extension x 12, hamstring curls x 12, required 2 min seated rest break between sets.  Squat pvt transfer wc to mat to R w/cga  only.  Pt demonstrates safe set up for transfer.  Educated pt regarding supine positioining as well as LE AROM activities to promote circulation/prevent fluid accumulation in LE's and urinary volume retention/fluctuations.  Sit to supine on mat w/supervision.  Mat therex including: Heel slides x 12 TKEs 2x12 bdrige w/Legs extended x2x 12 Hip abd/add supine x 15 SL hip abd LLE 2x10 Supine to R side to sit  W/supervision.   Mild dizzyness w/transition which resolved in 1-2 min and pt performing LE AROM to facilitate circulation/counteract pooling in LE's. Seated LAQ's x 10, ankle pumps x 20  Practiced squat pvit wc to/from mat x 6 reps w/min to cga and improved ability to maintain NWB when transferring to L w/repeated efforts. wc propulsion [id number] to room.  Pt independent w/set up for transfer.  Squat pivot transfer wc to bed t R side w/cga only, maintains NWB without difficulty. Sit to supine mod I w/rail.  Pt rolls mod I and therapist assists w/removing LSO due to difficulty managing velcro on L side. Pt left supine w/rails up x 3, alarm set, bed in lowest position, and needs in reach.  Pt quite verbose, but asks multiple appropriate questions during session and is receptive to education.  Very comfortable directing his care and does so in very appropriate manner.     Therapy Documentation Precautions:  Precautions Precautions: Fall, Back Precaution Comments: NWB LUE in sling Required Braces  or Orthoses: Sling, Spinal Brace Spinal Brace: Applied in sitting position(pt prefers to don in supine) Spinal Brace Comments: custom LSO Restrictions Weight Bearing Restrictions: Yes LUE Weight Bearing: Non weight bearing LLE Weight Bearing: Non weight bearing    Therapy/Group: Individual Therapy  Callie Fielding, [place] 11/03/2019, 10:38 AM

## 2019-11-03 NOTE — Progress Notes (Signed)
Physical Therapy Session Note  Patient Details  Name: Louis Jordan MRN: [id number] Date of Birth: 1956-11-02  Today's Date: 11/03/2019 PT Individual Time: 1300-1400 PT Individual Time Calculation (min): 60 min   Short Term Goals: Week 2:  PT Short Term Goal 1 (Week 2): =LTG due to ELOS  Skilled Therapeutic Interventions/Progress Updates:    Pt received supine in bed, agreeable to PT session. No complaints of pain. [name] Louis Jordan present for hands on family education session. Demonstrated how to assist pt with donning LSO brace in supine and R sidelying. [name] demonstrates good ability to assist pt with donning brace dependently with patient demonstrating good ability to self-direct his care. Pt is able to perform bed mobility at mod I level with increased time needed for safety. Demonstration of how pt performs stand pivot transfer with hemiwalker and squat pivot transfer to/from w/c. Pt is able to complete these transfers with CGA. [name] is able to perform return demo of this transfer with min cueing for safety and where to guard patient. Pt is able to self-direct his wife on how best to assist him with these transfers. Reviewed management of w/c parts including leg rest, arms rests, cushion, and how to fold up w/c to fit into the car. Pt left supine in bed handed off to OT for next family education session.  Therapy Documentation Precautions:  Precautions Precautions: Fall, Back Precaution Comments: NWB LUE in sling Required Braces or Orthoses: Sling, Spinal Brace Spinal Brace: Applied in sitting position(pt prefers to don in supine) Spinal Brace Comments: custom LSO Restrictions Weight Bearing Restrictions: Yes LUE Weight Bearing: Non weight bearing LLE Weight Bearing: Non weight bearing    Therapy/Group: Individual Therapy   Excell Seltzer, PT, DPT  11/03/2019, 3:36 PM

## 2019-11-03 NOTE — Progress Notes (Signed)
Recreational Therapy Session Note  Patient Details  Name: Louis Jordan MRN: [id number] Date of Birth: Jun 12, 1956 Today's Date: 11/03/2019 Time:  10-1214 Pain: no c/o Skilled Therapeutic Interventions/Progress Updates: Session focused on coping strategies, relaxation training, self awareness, discharge planning.  Discussion/education focused on awareness of self, feelings, emotions and their impact on his overall health and relationships with others.  Provided & reviewed handouts on activity analysis and diapharagamitc breathing, demonstrated breathing.  Discussion included identifying what aspects of his overall health and wellness (physical, emotional, spiritual, psychological & social domains) have shifted due to injuries and how to manage these changes moving forward.  Pt tearful at times, acknowledging challenges but stated motivation and determination to make progress.  Discussed support system as well, pt stating he has  wonderful relationship with his wife and family but does not want to burden them with his care.     [place] 11/03/2019, 3:39 PM

## 2019-11-04 ENCOUNTER — Inpatient Hospital Stay (HOSPITAL_COMMUNITY): Payer: Commercial Managed Care - PPO | Admitting: Physical Therapy

## 2019-11-04 ENCOUNTER — Inpatient Hospital Stay (HOSPITAL_COMMUNITY): Payer: Commercial Managed Care - PPO | Admitting: Occupational Therapy

## 2019-11-04 NOTE — Progress Notes (Addendum)
Physical Therapy Session Note  Patient Details  Name: Louis Jordan MRN: [id number] Date of Birth: [birth year]  Today's Date: 11/04/2019 PT Individual Time: [phone] PT Individual Time Calculation (min): 65 min and 45 min  Short Term Goals: Week 2:  PT Short Term Goal 1 (Week 2): =LTG due to ELOS  Skilled Therapeutic Interventions/Progress Updates:    Session 1: Pt received supine in bed with BLE elevated, agreeable to PT session. No complaints of pain. Pt feels that family education session with his wife previous date went well. Pt performs bed mobility at mod I level. Squat pivot transfer bed to w/c with Supervision. Pt is at Supervision to mod I level with w/c mobility and is able to direct caregiver with how to assist him with placing LLE leg rest on w/c. Manual w/c propulsion through obstacle course weaving through cones and navigating tight spaces forwards and backwards with min cueing for awareness of surroundings. Discussed ramp navigation as pt will have a small ramp at home. Attempted to ascend ramp forwards and pt requires min to mod A to control w/c with decreased safety noted. Pt is able to descend ramp forwards with Supervision. Pt is able to ascend ramp backwards with CGA progressing to Supervision with improved safety noted with performing ramp navigation in this manner. Discussion of how pt will cook himself a meal in apartment kitchen. Pt is able to navigate up/down galley style kitchen discussing his home setup and simulating how he will perform steps needed to cook himself breakfast including making coffee, cooking eggs, and making toast. Pt shows good insight to how his environment will need to be setup in order to be successful in these tasks. Also discussed how pt will carry his food and coffee to the kitchen table to eat. Cotreatment session with Recreational Therapist. Squat pivot transfer back to bed Supervision. Sit to supine mod I. Pt left supine in bed with needs in  reach at end of session.  Session 2: Pt received supine in bed with LE elevated, agreeable to PT session. No complaints of pain. Pt requesting to spend session working on transfers to/from [facility] and pericare. Supine to sit at mod I level with increased time needed to complete. Squat pivot transfer bed to heavy duty BSC with Supervision. Problem solved with patient managing clothing and brief in order to be able to void once seated on commode. Pt has BM in his brief while working on doffing the brief. Pt is able to doff his pants with min A to get them down on L side then is able to remove them the remainder of the way with use of his reacher. Pt is able to unhook his brief but needs assist for LSO management in order to be able to reach hooks. Discussed wearing depends at home vs a brief that connects on the side and discussed placement of brief over LSO for improved ability to doff it more independently. Pt is then setup A for pericare on commode. Pt does require assist from therapist for thoroughness with pericare. Stand pivot transfer BSC to bed with HW and Supervision. Pt has small BM incontinence from strain of transfer. Pt is dependent for remainder of pericare and donning new brief at bed level. Pt left supine in bed with needs in reach at end of session.  Therapy Documentation Precautions:  Precautions Precautions: Fall, Back Precaution Comments: NWB LUE in sling Required Braces or Orthoses: Sling, Spinal Brace Spinal Brace: Applied in sitting position(pt prefers to don in  supine) Spinal Brace Comments: custom LSO Restrictions Weight Bearing Restrictions: Yes LUE Weight Bearing: Non weight bearing LLE Weight Bearing: Non weight bearing    Therapy/Group: Individual Therapy   Excell Seltzer, PT, DPT  11/04/2019, 12:46 PM

## 2019-11-04 NOTE — Progress Notes (Signed)
Recreational Therapy Session Note  Patient Details  Name: Louis Jordan MRN: [id number] Date of Birth: [birth year] Today's Date: 11/04/2019 Time:  [phone] Pain: no c/o Skilled Therapeutic Interventions/Progress Updates: Session focused on community skills at w/c level negotiating around obstacles and up/down a ramp in preparation for discharge home.    Pt attempted ramp negotiation going forwards requiring min assist to maneuver over the ramp threshold, but supervision once on ramped surface.  Pt descended the ramp forwards with supervision.  Practiced going up the ramp backwards in which pt required supervision. Pt stated he preferred going up backwards due to greater independence.  Also discussed kitchen set up and meal prep as pt states he does a lot of cooking.  Pt performed simulated meal prep activity problem solving through obstacles and kitchen set up with supervision/min cues w/c level.    Therapy/Group: Co-Treatment [name] 11/04/2019, 11:02 AM

## 2019-11-04 NOTE — Progress Notes (Signed)
Bowel program as ordered this shift. Patient had a successful BM.

## 2019-11-04 NOTE — Progress Notes (Signed)
Occupational Therapy Session Note  Patient Details  Name: Louis Jordan MRN: [id number] Date of Birth: [birth year]  Today's Date: 11/04/2019 OT Individual Time: 4008-6761 and 1130-1200 OT Individual Time Calculation (min): 60 min and 30 min   Short Term Goals: Week 2:  OT Short Term Goal 1 (Week 2): STG=LTG due to LOS  Skilled Therapeutic Interventions/Progress Updates:    Session One: PT seen for OT session focusing on functional mobility and ADL re-training. Pt in supine upon arrival, agreeable to tx session. Requestng to be pre-medicated prior to tx session. RN made aware, pain meds due in ~1 hour. Pt agreeable to cont with therapy. Transferred to sitting EOB with supervision using bed rail. LSO donned total A seated EOB with pt directing care. Completed supervision stand pivot transfer to w/c using hemi-walker.  Completed grooming tasks from w/c level at sink with set-up.  Squat pivot transfer to drop arm BSC with supervision. With steadying assist, pt able to pull down pants from standing position. Small BM on BSC. He completed lateral lean to complete buttock hygiene with assist for thoroughness. Total A for time management to don new brief and pants.  Stand pivot back to w/c with supervision. "beep" board placed under pt's L foot as cuing to maintain L LE NWBing. Pt returning to supine at end of session, supervision squat pivot and return to supine.  Pt left in supine at end of session, all needs in reach and bed alarm on. Education provided throughout session regarding DME, modified ADLs, activity progression and d/c planing.   Session Two: Pt seen for OT session focusing on UE ROM and functional transfers. Pt in supine upon arrival, denying pain and agreeable to tx session.  Performed elbow ROM from supine position per MD orders with shoulder immobilized. Pt tolerated well and demonstrates full elbow flexion/extension. LSO donned supine total A.  He transferred to sitting EOB with  supervision. Close supervision stand pivot transfer to w/c with hemi-walker. Pt left seated in w/c at end of session, all needs in reach.   Therapy Documentation Precautions:  Precautions Precautions: Fall, Back Precaution Comments: NWB LUE in sling Required Braces or Orthoses: Sling, Spinal Brace Spinal Brace: Applied in sitting position(pt prefers to don in supine) Spinal Brace Comments: custom LSO Restrictions Weight Bearing Restrictions: Yes LUE Weight Bearing: Non weight bearing LLE Weight Bearing: Non weight bearing   Therapy/Group: Individual Therapy  Gay Moncivais L 11/04/2019, 6:54 AM

## 2019-11-04 NOTE — Progress Notes (Signed)
[place] PHYSICAL MEDICINE & REHABILITATION PROGRESS NOTE   Subjective/Complaints:   Pt reports bowel program was a little less time last night, not perfect, but heading in right direction- it's very uncomfortable for bottom if has to sit on BSC >45 minutes after suppository.  Wife tried to do in/out cath yesterday- did well except got worried/upset when hit last bit of resistance getting into bladder/past prostate- normal hesitancy- so she is noting normal issues that occurs.    ROS: Patient denies fever, rash, sore throat, blurred vision, nausea, vomiting, diarrhea, cough, shortness of breath or chest pain,  headache, or mood change.    Objective:   No results found. No results for input(s): WBC, HGB, HCT, PLT in the last 72 hours. No results for input(s): NA, K, CL, CO2, GLUCOSE, BUN, CREATININE, CALCIUM in the last 72 hours.  Intake/Output Summary (Last 24 hours) at 11/04/2019 0845 Last data filed at 11/04/2019 0300 Gross per 24 hour  Intake 340 ml  Output 3500 ml  Net -3160 ml     Physical Exam: Vital Signs Blood pressure (!) 171/75, pulse (!) 55, temperature 98.2 F (36.8 C), temperature source Oral, resp. rate 18, height 6' (1.829 m), weight 60.2 kg, SpO2 98 %.  Constitutional: No distress . Vital signs reviewed.sitting up in manual w/c with OT at side; doing grooming at sink;; wearing sling on L shoulder; arm immobilized HEENT: EOMI, oral membranes moist Neck: supple Cardiovascular: RRR without murmur. No JVD    Respiratory: CTA Bilaterally without wheezes or rales. Normal effort    GI: BS +, non-tender, non-distended  Skin: Left ankle with dressing C/D/I intact. Skin warm Psych: Normal mood.  Normal behavior. Musc: Left ankle with edema Neuro: Alert Right upper extremities 5/5 proximal distal Left upper extremity: Proximally limited by sling, hand grip 5/5 Right lower extremity: 5/5 proximal distal Left lower extremity: Hip flexion, knee extension 5/5, ankle  braced, stable motor ?mild sensory loss over 5th toes bilaterally  Assessment/Plan: 1. 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  Physiatrist is providing close team supervision and 24 hour management of active medical problems listed below.  Physiatrist and rehab team continue to assess barriers to discharge/monitor patient progress toward functional and medical goals  Care Tool:  Bathing  Bathing activity did not occur: Safety/medical concerns Body parts bathed by patient: Right arm, Left arm, Chest, Abdomen   Body parts bathed by helper: Buttocks(back)     Bathing assist Assist Level: Minimal Assistance - Patient > 75%     Upper Body Dressing/Undressing Upper body dressing   What is the patient wearing?: Pull over shirt    Upper body assist Assist Level: Maximal Assistance - Patient 25 - 49%    Lower Body Dressing/Undressing Lower body dressing      What is the patient wearing?: Underwear/pull up(short, ace wrap/soft cast to left leg lower leg)     Lower body assist Assist for lower body dressing: Moderate Assistance - Patient 50 - 74%     Toileting Toileting    Toileting assist Assist for toileting: Contact Guard/Touching assist     Transfers Chair/bed transfer  Transfers assist     Chair/bed transfer assist level: Contact Guard/Touching assist     Locomotion Ambulation   Ambulation assist   Ambulation activity did not occur: Safety/medical  concerns          Walk 10 feet activity   Assist  Walk 10 feet activity did not occur: Safety/medical concerns        Walk 50 feet activity   Assist Walk 50 feet with 2 turns activity did not occur: Safety/medical concerns         Walk 150 feet activity   Assist Walk 150  feet activity did not occur: Safety/medical concerns         Walk 10 feet on uneven surface  activity   Assist Walk 10 feet on uneven surfaces activity did not occur: Safety/medical concerns         Wheelchair     Assist Will patient use wheelchair at discharge?: Yes Type of Wheelchair: Manual    Wheelchair assist level: Supervision/Verbal cueing Max wheelchair distance: 150'    Wheelchair 50 feet with 2 turns activity    Assist        Assist Level: Supervision/Verbal cueing   Wheelchair 150 feet activity     Assist      Assist Level: Supervision/Verbal cueing   Blood pressure (!) 171/75, pulse (!) 55, temperature 98.2 F (36.8 C), temperature source Oral, resp. rate 18, height 6' (1.829 m), weight 60.2 kg, SpO2 98 %.  Medical Problem List and Plan: 1.Impaired mobility/ADLs/Functionsecondary to Cauda equina syndrome, L shoulder dislocation s/p repair (very unstable L shoulder) NWB x 6 weeks, and L trimalleolar fx s/p ORIF NWB 6+ weeks with neurogenic bowel and bladder.  Cont CIR PT OT 2. Antithrombotics: -DVT/anticoagulation:Pharmaceutical:Lovenox -antiplatelet therapy: N/A 3. Pain Management: Post op pain and SCI AT LEVEL pain  On tylenol 1000 mg qid, ultram 25 mg qid, robaxin 500 mg qid, and Oxycodone prn.  Started gabapentin for nerve pain, increased on 11/12, tingling improved on 11/17.   OxyContin 10 twice daily started on 11/13  Improving on 11/16 4. Mood:LCSW to follow for evaluation and support -antipsychotic agents: N/A 5. Neuropsych: This patientiscapable of making decisions onhisown behalf. 6. Skin/Wound Care:Routine pressure relief measures. 7. Fluids/Electrolytes/Nutrition:Monitor I/O.  8. Left shoulder dislocation s/p arthroscopy: Sling with NWB X 5 more weeks. 9. ORIF Left trimalleolar ankle fracture: NWB LLE 10 ABLA: Continue to monitor..  Hemoglobin 12.0 on 11/11--->10.8 today, no  signs of blood loss  Fe++ supp 11. Neurogenic bowel:  KUB on 11/10 reviewed, showing mild constipation  Discontinued Celebrex as reports of abdominal pain  Change Senna S to plain senna.   Continue miralax.   Added Anusol HC cream for hems.  continue bowel program   11/19- discussed bowel program with day and night nurse to make sure has good passoff  11/20- slightly better overnight. 12. Neurogenic bladder:  Started Flomax, increased on 11/14  Continue I/O caths, coud caths ordered with Urojet  RX UTI  -begin trial of urecholine 10mg  tid  11/19- needs cath q4 hours- lay in bed between therapies to help night time dumps of urine/caths levels too high- pt drinking ~ 1500cc/day, but also getting IV ABX which increases fluid levels.  11/20- wife taught how to do in/out caths yesterday- working through concerns 13. Steroid induced hyperglycemia:Question timing of weaning.  Continue to monitor 14.  Hypoalbuminemia  Supplement initiated on 11/14 15.  Acute lower UTI secondary to Pseudomonas aeruginosa  WBCs 10.0 11/16  Continue Cefepime started on 11/14- til 11/21 since complicated UTI from in/out caths/neurogenic bladder 16. Sexual/intimacy issues- will d/w with pt and wife PRIOR to d/c.  11/20- to do on d/c.  LOS: 10 days A FACE TO FACE EVALUATION WAS PERFORMED  Matther Labell 11/04/2019, 8:45 AM

## 2019-11-05 MED ORDER — BETHANECHOL CHLORIDE 25 MG PO TABS
50.0000 mg | ORAL_TABLET | Freq: Three times a day (TID) | ORAL | Status: DC
  Administered 2019-11-05 – 2019-11-08 (×10): 50 mg via ORAL
  Filled 2019-11-05 (×10): qty 2

## 2019-11-05 NOTE — Progress Notes (Signed)
[facility] PHYSICAL MEDICINE & REHABILITATION PROGRESS NOTE   Subjective/Complaints:   Bowels improving. Still not voiding but having the urge. Usually trying to empty lying in bed!   ROS: Patient denies fever, rash, sore throat, blurred vision, nausea, vomiting, diarrhea, cough, shortness of breath or chest pain, joint or back pain, headache, or mood change.     Objective:   No results found. No results for input(s): WBC, HGB, HCT, PLT in the last 72 hours. No results for input(s): NA, K, CL, CO2, GLUCOSE, BUN, CREATININE, CALCIUM in the last 72 hours.  Intake/Output Summary (Last 24 hours) at 11/05/2019 0849 Last data filed at 11/05/2019 0509 Gross per 24 hour  Intake 360 ml  Output 2750 ml  Net -2390 ml     Physical Exam: Vital Signs Blood pressure (!) 144/75, pulse (!) 52, temperature 97.6 F (36.4 C), temperature source Oral, resp. rate 16, height 6' (1.829 m), weight 62 kg, SpO2 99 %.  Constitutional: No distress . Vital signs reviewed. HEENT: EOMI, oral membranes moist Neck: supple Cardiovascular: RRR without murmur. No JVD    Respiratory: CTA Bilaterally without wheezes or rales. Normal effort    GI: BS +, non-tender, non-distended  Skin: Left ankle with dressing C/D/I intact. Skin remains warm Psych: Normal mood.  Normal behavior. Musc: Left ankle with tr edema Neuro: Alert Right upper extremities 5/5 proximal distal Left upper extremity: Proximally limited by sling, hand grip 5/5 Right lower extremity: 5/5 proximal distal Left lower extremity: Hip flexion, knee extension 5/5, ankle braced, stable motor ?mild sensory loss over lateral foot bilaterally ongoing  Assessment/Plan: 1. 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  Physiatrist is  providing close team supervision and 24 hour management of active medical problems listed below.  Physiatrist and rehab team continue to assess barriers to discharge/monitor patient progress toward functional and medical goals  Care Tool:  Bathing  Bathing activity did not occur: Safety/medical concerns Body parts bathed by patient: Right arm, Left arm, Chest, Abdomen   Body parts bathed by helper: Buttocks(back)     Bathing assist Assist Level: Minimal Assistance - Patient > 75%     Upper Body Dressing/Undressing Upper body dressing   What is the patient wearing?: Pull over shirt    Upper body assist Assist Level: Maximal Assistance - Patient 25 - 49%    Lower Body Dressing/Undressing Lower body dressing      What is the patient wearing?: Underwear/pull up(short, ace wrap/soft cast to left leg lower leg)     Lower body assist Assist for lower body dressing: Moderate Assistance - Patient 50 - 74%     Toileting Toileting    Toileting assist Assist for toileting: Minimal Assistance - Patient > 75%     Transfers Chair/bed transfer  Transfers assist     Chair/bed transfer assist level: Supervision/Verbal cueing     Locomotion Ambulation   Ambulation assist   Ambulation activity did not occur: Safety/medical concerns          Walk 10 feet activity   Assist  Walk 10 feet activity did not occur: Safety/medical concerns        Walk 50 feet activity   Assist Walk 50 feet with 2 turns activity did not occur: Safety/medical concerns  Walk 150 feet activity   Assist Walk 150 feet activity did not occur: Safety/medical concerns         Walk 10 feet on uneven surface  activity   Assist Walk 10 feet on uneven surfaces activity did not occur: Safety/medical concerns         Wheelchair     Assist Will patient use wheelchair at discharge?: Yes Type of Wheelchair: Manual    Wheelchair assist level: Supervision/Verbal  cueing Max wheelchair distance: 150'    Wheelchair 50 feet with 2 turns activity    Assist        Assist Level: Supervision/Verbal cueing   Wheelchair 150 feet activity     Assist      Assist Level: Supervision/Verbal cueing   Blood pressure (!) 144/75, pulse (!) 52, temperature 97.6 F (36.4 C), temperature source Oral, resp. rate 16, height 6' (1.829 m), weight 62 kg, SpO2 99 %.  Medical Problem List and Plan: 1.Impaired mobility/ADLs/Functionsecondary to Cauda equina syndrome, L shoulder dislocation s/p repair (very unstable L shoulder) NWB x 6 weeks, and L trimalleolar fx s/p ORIF NWB 6+ weeks with neurogenic bowel and bladder.  Cont CIR PT OT 2. Antithrombotics: -DVT/anticoagulation:Pharmaceutical:Lovenox -antiplatelet therapy: N/A 3. Pain Management: Post op pain and SCI AT LEVEL pain  On tylenol 1000 mg qid, ultram 25 mg qid, robaxin 500 mg qid, and Oxycodone prn.  Started gabapentin for nerve pain, increased on 11/12, tingling improved on 11/17.   OxyContin 10 twice daily started on 11/13  Improved control 11/21 4. Mood:LCSW to follow for evaluation and support -antipsychotic agents: N/A 5. Neuropsych: This patientiscapable of making decisions onhisown behalf. 6. Skin/Wound Care:Routine pressure relief measures. 7. Fluids/Electrolytes/Nutrition:Monitor I/O.  8. Left shoulder dislocation s/p arthroscopy: Sling with NWB X 5 more weeks. 9. ORIF Left trimalleolar ankle fracture: NWB LLE 10 ABLA: Continue to monitor..  Hemoglobin 12.0 on 11/11--->10.8 today, no signs of blood loss  Fe++ supp  11. Neurogenic bowel:  KUB on 11/10 reviewed, showing mild constipation  Discontinued Celebrex as reports of abdominal pain  Change Senna S to plain senna.   Continue miralax.   Added Anusol HC cream for hems.  continue bowel program   11/19- discussed bowel program with day and night nurse to make sure has good  passoff  11/21 emptying but still incontinent--bowel program with PM suppository in place 12. Neurogenic bladder:  Started Flomax, increased on 11/14  Continue I/O caths, coud caths ordered with Urojet  11/19- needs cath q4 hours- lay in bed between therapies to help night time dumps of urine/caths levels too high- pt drinking ~ 1500cc/day, but also getting IV ABX which increases fluid levels.  11/20- wife taught how to do in/out caths yesterday- working through concerns  11/21--increase urecholine to 50mg  tid   -needs to be up to toilet either sitting or standing to empty, double voids, massage, etc   -UTI rx'ed 13. Steroid induced hyperglycemia:Question timing of weaning.  Continue to monitor 14.  Hypoalbuminemia  Supplement initiated on 11/14 15.  Acute lower UTI secondary to Pseudomonas aeruginosa  WBCs 10.0 11/16  11.21--dc cefipime. Has received 7 days   16. Sexual/intimacy issues- will d/w with pt and wife PRIOR to d/c.      LOS: 11 days A FACE TO FACE EVALUATION WAS PERFORMED  12/16 11/05/2019, 8:49 AM

## 2019-11-05 NOTE — Plan of Care (Signed)
  Problem: SCI BLADDER ELIMINATION Goal: RH STG MANAGE BLADDER WITH ASSISTANCE Description: STG Manage Bladder With min Assistance Outcome: Not Progressing; in and out cath    Problem: SCI BOWEL ELIMINATION Goal: RH STG MANAGE BOWEL WITH ASSISTANCE Description: STG Manage Bowel with mod I Assistance. Outcome: Not Progressing; bowel program

## 2019-11-06 ENCOUNTER — Ambulatory Visit (HOSPITAL_COMMUNITY): Payer: Commercial Managed Care - PPO | Admitting: Physical Therapy

## 2019-11-06 NOTE — Plan of Care (Signed)
  Problem: SCI BOWEL ELIMINATION Goal: RH STG MANAGE BOWEL WITH ASSISTANCE Description: STG Manage Bowel with mod I Assistance. Outcome: Not Progressing; bowel program    Problem: SCI BLADDER ELIMINATION Goal: RH STG MANAGE BLADDER WITH ASSISTANCE Description: STG Manage Bladder With min Assistance Outcome: Not Progressing; per patient he can feel if he's full but unable ; tried urinal on side of bed yesterday. Wife participates in I and O cath assisted by RN if she is here. Per wife  she needs more practice.

## 2019-11-06 NOTE — Progress Notes (Signed)
Physical Therapy Session Note  Patient Details  Name: Louis Jordan MRN: [id number] Date of Birth: 10-31-1956  Today's Date: 11/06/2019 PT Individual Time: 1300-1400 PT Individual Time Calculation (min): 60 min   Short Term Goals: Week 2:  PT Short Term Goal 1 (Week 2): =LTG due to ELOS  Skilled Therapeutic Interventions/Progress Updates:    Pt received supine in bed, wife present for continuation of hands-on family education session. No complaints of pain. [name] is able to assist him with donning LSO in supine and sidelying with v/c from patient to direct her. Pt performs bed mobility mod I with increased time needed for safety and due to precautions. Squat pivot transfer bed to w/c with Supervision. Pt is able to direct his wife on how to setup legrest and assist with management of w/c parts. Took patient outside to parking garage to practice transfers on the car he will d/c home in. Performed transfer with patient at Supervision level assist then had pt's wife perform return demo of transfer. Discussed moving passenger seat of the car forwards for improved safety when backing up to the seat to sit down. Pt and his wife demonstrate good ability to perform transfer safely. Pt has incontinence of bowel during last transfer. Assisted pt back upstairs. Squat pivot transfer back to bed Supervision. Bed mobility mod I. Pt dependent for pericare and brief change due to time conservation. Discussed traveling with multiple briefs and wipes in case pt has an accident on the way to/from a doctor's appointment. Also discussed timing of bowel program following meals to hopefully avoid accidents. Pt left supine in bed with needs in reach at end of session.  Therapy Documentation Precautions:  Precautions Precautions: Fall, Back Precaution Comments: NWB LUE in sling Required Braces or Orthoses: Sling, Spinal Brace Spinal Brace: Applied in sitting position(pt prefers to don in supine) Spinal Brace Comments:  custom LSO Restrictions Weight Bearing Restrictions: Yes LUE Weight Bearing: Non weight bearing LLE Weight Bearing: Non weight bearing    Therapy/Group: Individual Therapy   Excell Seltzer, PT, DPT  11/06/2019, 3:39 PM

## 2019-11-06 NOTE — Progress Notes (Signed)
[place] PHYSICAL MEDICINE & REHABILITATION PROGRESS NOTE   Subjective/Complaints:   Continues to experience more sensation while urinating. Advised by Louis Jordan to sit up for urination and defecation and he will try this today. Denies pain or constipation. He has some questions for his surgeons--how long should he wear his back brace for, when can he start bearing weight, when will his boot be changed to a cast. He would like to speak with one of his surgeons prior to his discharge on Tuesday.    ROS: Patient denies fever, rash, sore throat, blurred vision, nausea, vomiting, diarrhea, cough, shortness of breath or chest pain, joint or back pain, headache, or mood change.     Objective:   No results found. No results for input(s): WBC, HGB, HCT, PLT in the last 72 hours. No results for input(s): NA, K, CL, CO2, GLUCOSE, BUN, CREATININE, CALCIUM in the last 72 hours.  Intake/Output Summary (Last 24 hours) at 11/06/2019 0947 Last data filed at 11/06/2019 0726 Gross per 24 hour  Intake 440 ml  Output 3250 ml  Net -2810 ml     Physical Exam: Vital Signs Blood pressure 109/64, pulse (!) 58, temperature 98.8 F (37.1 C), resp. rate 16, height 6' (1.829 m), weight 62 kg, SpO2 96 %.  Constitutional: No distress . Vital signs reviewed. Lying in bed sleeping, easily  arousable.  HEENT: EOMI, oral membranes moist Neck: supple Cardiovascular: RRR without murmur. No JVD    Respiratory: CTA Bilaterally without wheezes or rales. Normal effort    GI: BS +, non-tender, non-distended  Skin: Left ankle with dressing C/D/I intact. Skin remains warm Psych: Normal mood.  Normal behavior. Musc: Left ankle with tr edema Neuro: Alert Right upper extremities 5/5 proximal distal Left upper extremity: Proximally limited by sling, hand grip 5/5 Right lower extremity: 5/5 proximal distal Left lower extremity: Hip flexion, knee extension 5/5, ankle braced, stable motor ?mild sensory loss over  lateral foot bilaterally ongoing  Assessment/Plan: 1. 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  Physiatrist is providing close team supervision and 24 hour management of active medical problems listed below.  Physiatrist and rehab team continue to assess barriers to discharge/monitor patient progress toward functional and medical goals  Care Tool:  Bathing  Bathing activity did not occur: Safety/medical concerns Body parts bathed by patient: Right arm, Left arm, Chest, Abdomen   Body parts bathed by helper: Buttocks(back)     Bathing assist Assist Level: Minimal Assistance - Patient > 75%     Upper Body Dressing/Undressing Upper body dressing   What is the patient wearing?: Pull over shirt    Upper body assist Assist Level: Maximal Assistance - Patient 25 - 49%    Lower Body Dressing/Undressing Lower body dressing      What is the patient wearing?: Underwear/pull up(short, ace wrap/soft cast to left leg lower leg)     Lower body assist Assist for lower body dressing: Moderate Assistance - Patient 50 - 74%     Toileting Toileting    Toileting assist Assist for toileting: Minimal Assistance - Patient > 75%     Transfers Chair/bed transfer  Transfers assist     Chair/bed transfer assist level: Supervision/Verbal cueing     Locomotion Ambulation   Ambulation assist   Ambulation activity did not  occur: Safety/medical concerns          Walk 10 feet activity   Assist  Walk 10 feet activity did not occur: Safety/medical concerns        Walk 50 feet activity   Assist Walk 50 feet with 2 turns activity did not occur: Safety/medical concerns         Walk 150 feet activity   Assist Walk 150 feet activity did not occur:  Safety/medical concerns         Walk 10 feet on uneven surface  activity   Assist Walk 10 feet on uneven surfaces activity did not occur: Safety/medical concerns         Wheelchair     Assist Will patient use wheelchair at discharge?: Yes Type of Wheelchair: Manual    Wheelchair assist level: Supervision/Verbal cueing Max wheelchair distance: 150'    Wheelchair 50 feet with 2 turns activity    Assist        Assist Level: Supervision/Verbal cueing   Wheelchair 150 feet activity     Assist      Assist Level: Supervision/Verbal cueing   Blood pressure 109/64, pulse (!) 58, temperature 98.8 F (37.1 C), resp. rate 16, height 6' (1.829 m), weight 62 kg, SpO2 96 %.  Medical Problem List and Plan: 1.Impaired mobility/ADLs/Functionsecondary to Cauda equina syndrome, L shoulder dislocation s/p repair (very unstable L shoulder) NWB x 6 weeks, and L trimalleolar fx s/p ORIF NWB 6+ weeks with neurogenic bowel and bladder.  Cont CIR PT OT 2. Antithrombotics: -DVT/anticoagulation:Pharmaceutical:Lovenox -antiplatelet therapy: N/A 3. Pain Management: Post op pain and SCI AT LEVEL pain  On tylenol 1000 mg qid, ultram 25 mg qid, robaxin 500 mg qid, and Oxycodone prn.  Started gabapentin for nerve pain, increased on 11/12, tingling improved on 11/17.   OxyContin 10 twice daily started on 11/13  Improved control 11/21 4. Mood:LCSW to follow for evaluation and support -antipsychotic agents: N/A 5. Neuropsych: This patientiscapable of making decisions onhisown behalf. 6. Skin/Wound Care:Routine pressure relief measures. 7. Fluids/Electrolytes/Nutrition:Monitor I/O.  8. Left shoulder dislocation s/p arthroscopy: Sling with NWB X 5 more weeks. 9. ORIF Left trimalleolar ankle fracture: NWB LLE 10 ABLA: Continue to monitor..  Hemoglobin 12.0 on 11/11--->10.8 today, no signs of blood loss  Fe++ supp  11. Neurogenic  bowel:  KUB on 11/10 reviewed, showing mild constipation  Discontinued Celebrex as reports of abdominal pain  Change Senna S to plain senna.   Continue miralax.   Added Anusol HC cream for hems.  continue bowel program   11/19- discussed bowel program with day and night nurse to make sure has good passoff  11/21 emptying but still incontinent--bowel program with PM suppository in place 12. Neurogenic bladder:  Started Flomax, increased on 11/14  Continue I/O caths, coud caths ordered with [employer]  11/19- needs cath q4 hours- lay in bed between therapies to help night time dumps of urine/caths levels too high- pt drinking ~ 1500cc/day, but also getting IV ABX which increases fluid levels.  11/20- wife taught how to do in/out caths yesterday- working through concerns  11/21--increase urecholine to 50mg  tid   -needs to be up to toilet either sitting or standing to empty, double voids, massage, etc   -UTI rx'ed 13. Steroid induced hyperglycemia:Question timing of weaning.  Continue to monitor 14.  Hypoalbuminemia  Supplement initiated on 11/14 15.  Acute lower UTI secondary to Pseudomonas aeruginosa  WBCs 10.0 11/16  11.21--dc cefipime. Has received 7 days 16. Sexual/intimacy  issues- will d/w with pt and wife PRIOR to d/c. 17. Disposition: Patient has some questions he would like to discuss with his surgeon prior to his planned discharge on Tuesday, regarding his weightbearing status, bracing, and plan for cast removal. I advised him that these issues will be evaluated at his first surgical outpatient appointment, but that I would contact the surgeon's office to see if someone from the team may be able to see patient prior to his discharge or answer his questions via phone.       LOS: 12 days A FACE TO FACE EVALUATION WAS PERFORMED  Louis Jordan 11/06/2019, 9:47 AM

## 2019-11-07 ENCOUNTER — Inpatient Hospital Stay (HOSPITAL_COMMUNITY): Payer: Commercial Managed Care - PPO | Admitting: Physical Therapy

## 2019-11-07 ENCOUNTER — Inpatient Hospital Stay (HOSPITAL_COMMUNITY): Payer: Commercial Managed Care - PPO

## 2019-11-07 ENCOUNTER — Inpatient Hospital Stay (HOSPITAL_COMMUNITY): Payer: Commercial Managed Care - PPO | Admitting: *Deleted

## 2019-11-07 LAB — CBC
HCT: 36.4 % — ABNORMAL LOW (ref 39.0–52.0)
Hemoglobin: 12 g/dL — ABNORMAL LOW (ref 13.0–17.0)
MCH: 32.4 pg (ref 26.0–34.0)
MCHC: 33 g/dL (ref 30.0–36.0)
MCV: 98.4 fL (ref 80.0–100.0)
Platelets: 368 10*3/uL (ref 150–400)
RBC: 3.7 MIL/uL — ABNORMAL LOW (ref 4.22–5.81)
RDW: 15.1 % (ref 11.5–15.5)
WBC: 7.9 10*3/uL (ref 4.0–10.5)
nRBC: 0 % (ref 0.0–0.2)

## 2019-11-07 LAB — BASIC METABOLIC PANEL
Anion gap: 7 (ref 5–15)
BUN: 24 mg/dL — ABNORMAL HIGH (ref 8–23)
CO2: 31 mmol/L (ref 22–32)
Calcium: 8.8 mg/dL — ABNORMAL LOW (ref 8.9–10.3)
Chloride: 98 mmol/L (ref 98–111)
Creatinine, Ser: 0.75 mg/dL (ref 0.61–1.24)
GFR calc Af Amer: >60 mL/min (ref >60)
GFR calc non Af Amer: >60 mL/min (ref >60)
Glucose, Bld: 101 mg/dL — ABNORMAL HIGH (ref 70–99)
Potassium: 4.2 mmol/L (ref 3.5–5.1)
Sodium: 136 mmol/L (ref 135–145)

## 2019-11-07 MED ORDER — ENOXAPARIN (LOVENOX) PATIENT EDUCATION KIT
PACK | Freq: Once | Status: AC
  Administered 2019-11-07: 18:00:00
  Filled 2019-11-07: qty 1

## 2019-11-07 NOTE — Plan of Care (Signed)
  Problem: Consults Goal: RH SPINAL CORD INJURY PATIENT EDUCATION Description:  See Patient Education module for education specifics.  11/07/2019 0941 by Gerald Stabs, RN Outcome: Progressing 11/07/2019 0900 by Gerald Stabs, RN Outcome: Progressing Goal: Skin Care Protocol Initiated - if Braden Score 18 or less Description: If consults are not indicated, leave blank or document N/A 11/07/2019 0941 by Gerald Stabs, RN Outcome: Progressing 11/07/2019 0900 by Gerald Stabs, RN Outcome: Progressing   Problem: SCI BOWEL ELIMINATION Goal: RH STG MANAGE BOWEL WITH ASSISTANCE Description: STG Manage Bowel with mod I Assistance. 11/07/2019 0941 by Gerald Stabs, RN Outcome: Progressing 11/07/2019 0900 by Gerald Stabs, RN Outcome: Progressing Goal: RH STG SCI MANAGE BOWEL WITH MEDICATION WITH ASSISTANCE Description: STG SCI Manage bowel with medication with mod I assistance. 11/07/2019 0941 by Gerald Stabs, RN Outcome: Progressing 11/07/2019 0900 by Gerald Stabs, RN Outcome: Progressing Goal: RH STG SCI MANAGE BOWEL PROGRAM W/ASSIST OR AS APPROPRIATE Description: STG SCI Manage bowel program with mod assist or as appropriate. 11/07/2019 0941 by Gerald Stabs, RN Outcome: Progressing 11/07/2019 0900 by Gerald Stabs, RN Outcome: Progressing   Problem: SCI BLADDER ELIMINATION Goal: RH STG MANAGE BLADDER WITH ASSISTANCE Description: STG Manage Bladder With min Assistance 11/07/2019 0941 by Gerald Stabs, RN Outcome: Progressing 11/07/2019 0900 by Gerald Stabs, RN Outcome: Progressing Goal: RH STG MANAGE BLADDER WITH MEDICATION WITH ASSISTANCE Description: STG Manage Bladder With Medication With mod I Assistance. 11/07/2019 0941 by Gerald Stabs, RN Outcome: Progressing 11/07/2019 0900 by Gerald Stabs, RN Outcome: Progressing Goal: RH STG MANAGE BLADDER WITH EQUIPMENT WITH ASSISTANCE Description: STG Manage Bladder With Equipment With min  Assistance 11/07/2019 0941 by Gerald Stabs, RN Outcome: Progressing 11/07/2019 0900 by Gerald Stabs, RN Outcome: Progressing   Problem: RH SKIN INTEGRITY Goal: RH STG SKIN FREE OF INFECTION/BREAKDOWN Description: Pt will be free of skin breakdown or infection with min assist  11/07/2019 0941 by Gerald Stabs, RN Outcome: Progressing 11/07/2019 0900 by Gerald Stabs, RN Outcome: Progressing   Problem: RH PAIN MANAGEMENT Goal: RH STG PAIN MANAGED AT OR BELOW PT'S PAIN GOAL Description: Less than 4 on 0-10 scale 11/07/2019 0941 by Gerald Stabs, RN Outcome: Progressing 11/07/2019 0900 by Gerald Stabs, RN Outcome: Progressing   Problem: RH KNOWLEDGE DEFICIT SCI Goal: RH STG INCREASE KNOWLEDGE OF SELF CARE AFTER SCI Description: Pt and family will be able to manage self care of fractures and perform bowel program with min assist prior to discharge. Pt will also be able to verbalize fall precautions to use at home.  11/07/2019 0941 by Gerald Stabs, RN Outcome: Progressing 11/07/2019 0900 by Gerald Stabs, RN Outcome: Progressing

## 2019-11-07 NOTE — Progress Notes (Signed)
Orthopedic Tech Progress Note Patient Details:  Louis Jordan 1956/02/11 [id number]  Ortho Devices Type of Ortho Device: CAM walker Ortho Device/Splint Location: LLE Ortho Device/Splint Interventions: Ordered, Application   Post Interventions Patient Tolerated: Well Instructions Provided: Care of device   Braulio Bosch 11/07/2019, 3:42 PM

## 2019-11-07 NOTE — Progress Notes (Signed)
Recreational Therapy Session Note  Patient Details  Name: Louis Jordan MRN: [id number] Date of Birth: [birth year] Today's Date: 11/07/2019 Time:  4098-1191 Pain: no c/o Skilled Therapeutic Interventions/Progress Updates:  Pt resting in bed upon arrival, stated he just took some medication.  Session focused on discharge planning discussion.  Pt states he is as ready as he can be for discharge tomorrow, is anxious for continued healing and strength.  Reviewed his overall health and wellness, reiterating physical, emotional, psychological, social and spiritual health.   Therapy/Group: Individual Therapy   Esli Clements 11/07/2019, 3:54 PM

## 2019-11-07 NOTE — Discharge Summary (Signed)
Physician Discharge Summary  Patient ID: Louis Jordan MRN: [id number] DOB/AGE: 1956-05-17 63 y.o.  Admit date: 10/25/2019 Discharge date: 11/08/2019  Discharge Diagnoses:  Principal Problem:   Cauda equina syndrome [facility]) Active Problems:   Bankart variant lesion of left shoulder   Closed displaced trimalleolar fracture of left ankle   Closed burst fracture of lumbar vertebra (HCC)   Neurogenic bladder   Neurogenic bowel   Hypoalbuminemia due to protein-calorie malnutrition (HCC)   Acute blood loss anemia   Neuropathic pain   Discharged Condition: stable  Significant Diagnostic Studies:  Left Ankle  Result Date: 11/07/2019 CLINICAL DATA:  Post left ankle ORIF. EXAM: LEFT ANKLE COMPLETE - 3+ VIEW COMPARISON:  Radiographs 10/19/2019. FINDINGS: The splint has been removed. The hardware is intact status post medial tibial plate and screw fixation and screw fixation of the distal fibula. The underlying fracture lines remain evident without significant displacement. 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 No new findings. IMPRESSION: Intact hardware and stable appearance status post ORIF for fractures of the distal tibia and fibula. Electronically Signed   By: Carey Bullocks M.D.   On: 11/07/2019 16:44   Dg Abd 1 View  Result Date: 10/25/2019 CLINICAL DATA:  Constipation for several days EXAM: ABDOMEN - 1 VIEW COMPARISON:  10/15/2019 FINDINGS: Scattered large and small bowel gas is noted. Mild retained fecal material is seen without significant constipation. Postsurgical changes are noted in the thoracolumbar spine. Compression deformity at L1 is noted similar to that seen on prior CT examination. No free air is noted. IMPRESSION: Postsurgical changes are seen. Mild retained fecal material is noted without significant constipation. Electronically Signed   By: Alcide Clever M.D.   On: 10/25/2019 22:36    Labs:  Basic Metabolic Panel: BMP Latest Ref Rng & Units 11/07/2019  10/31/2019 10/26/2019  Glucose 70 - 99 mg/dL 622(Q) 333(L) 456(Y)  BUN 8 - 23 mg/dL 56(L) 23 23  Creatinine 0.61 - 1.24 mg/dL 8.93 7.34 2.87  Sodium 135 - 145 mmol/L 136 134(L) 135  Potassium 3.5 - 5.1 mmol/L 4.2 4.3 4.3  Chloride 98 - 111 mmol/L 98 96(L) 98  CO2 22 - 32 mmol/L 31 28 28   Calcium 8.9 - 10.3 mg/dL ) 6.8(T) 1.5(B)    CBC: CBC Latest Ref Rng & Units 11/07/2019 10/31/2019 10/26/2019  WBC 4.0 - 10.5 K/uL 7.9 10.0 10.6(H)  Hemoglobin 13.0 - 17.0 g/dL 12.0(L) 10.8(L) 12.0(L)  Hematocrit 39.0 - 52.0 % 36.4(L) 31.8(L) 35.3(L)  Platelets 150 - 400 K/uL 368 400 433(H)    CBG: No results for input(s): GLUCAP in the last 168 hours.  Brief HPI:   Louis Jordan is a 63 y.o. male who was admitted on 10/15/2019 after a fall off ladder with subsequent L1 burst fracture with retropulsion, left anterior shoulder dislocation and left trimalleolar ankle fracture.  Shoulder reduced by trauma and sling recommended and left ankle was splinted.  He was taken to the OR for ORIF L1 fracture with decompressive laminectomy with removal of epidural hematoma and medial facetectomy and foraminotomy with posterior fixation T10-L4 by Dr. 10/17/2019.  Postop noted to have perineal numbness due to conus injury with neurogenic bowel and bladder.  He did have repeat dislocation of left shoulder and Dr. Yetta Barre felt that patient with Bankart injury.    Patient was taken to the OR on 11/4 for arthroscopy with removal of loose body and labral repair and capsulorrhaphy as well as ORIF left trimalleolar ankle fracture by Dr. 13/4.  Postop  to be NWB LLE and NWB LUE for 6 weeks with gentle stretching at elbow due to unstable shoulder.  Custom LSO ordered for back support.  Foley in place due to urinary retention and he has had issues with constipation.  He continues to be limited by NWB LUE/LLE, weakness and pain affecting overall mobility and ADLs.  CIR recommended due to functional deficits.   Hospital Course: Louis Jordan  was admitted to rehab 10/25/2019 for inpatient therapies to consist of PT and OT at least three hours five days a week. Past admission physiatrist, therapy team and rehab RN have worked together to provide customized collaborative inpatient rehab.  He was started on gabapentin for neuropathic pain.  Bowel program was initiated and has been adjusted with good results.  Anusol HC cream was added for hemorrhoids.  Foley was discontinued and he was started on Urecholine as well as Flomax due to urinary retention.  He was found to have Pseudomonas aeruginosa UTI and was treated with 7-day course of cefepime. He has required in and out caths due to neurogenic bladder but has had in discomfort requiring specialized coud catheter which was unavailable at discharge.  Therefore indwelling Foley placed at discharge and patient to follow-up with urology for evaluation and for input on repeat voiding trial.   He was maintained on subcu Lovenox for DVT prophylaxis and is to continue on this for 3 total months post surgery.  Follow-up CBC showed acute blood loss anemia and he was started on iron supplements.  H&H has improved and leukocytosis has resolved.  Serial check of lites shows mild prerenal azotemia.  Team has been providing ego support to help with mood stabilization.  Left ankle splint was changed out to cam boot by Ortho on 11/23 and follow up Xrays show stability. He was cleared to start gentle ROM of left ankle. He has made gains during rehab stay and is currently supervision at wheelchair level. He will continue to receive follow up HHPT, HHOT and HHRN by [facility]  after discharge  Rehab course: During patient's stay in rehab weekly team conferences were held to monitor patient's progress, set goals and discuss barriers to discharge. At admission, patient required max assist with mobility and with basic self care tasks. He  has had improvement in activity tolerance, balance, postural control as well as  ability to compensate for deficits. He is able to complete ADL tasks with supervision. He requires supervision for transfers with use of hemi walker. Family education has been completed regarding all aspects of mobility, ADLs, B/B program and safety.   Disposition: Home  Diet: Regular  Special Instructions: 1. Foley care bid and after BM.  2. No weight on LUE/LLE.  3. Continue back precautions. Wear LSO when at bedside. Continue to wear sling LUE at all times. Wear CAM boot left ankle.   Allergies as of 11/08/2019      Reactions   Penicillins Swelling      Medication List    STOP taking these medications   ALEVE PM PO   ibuprofen 200 MG tablet Commonly known as: ADVIL     TAKE these medications   acetaminophen 325 MG tablet Commonly known as: TYLENOL Take 2 tablets (650 mg total) by mouth every 6 (six) hours as needed.   bisacodyl 10 MG suppository Commonly known as: DULCOLAX Place 1 suppository (10 mg total) rectally daily after supper.   dexamethasone 4 MG tablet Commonly known as: DECADRON Take one pill three times  a day for 4 days. Then decrease to one pill twice a day for 4 days. Then decrease to one per day till gone.   docusate sodium 100 MG capsule Commonly known as: COLACE Take 1 capsule (100 mg total) by mouth 2 (two) times daily.   enoxaparin 40 MG/0.4ML injection Commonly known as: LOVENOX Inject 0.4 mLs (40 mg total) into the skin daily.   ferrous sulfate 325 (65 FE) MG tablet Take 1 tablet (325 mg total) by mouth daily with breakfast.   gabapentin 400 MG capsule Commonly known as: NEURONTIN Take 1 capsule (400 mg total) by mouth 3 (three) times daily.   hydrocortisone 2.5 % rectal cream Commonly known as: ANUSOL-HC Place rectally 3 (three) times daily. Notes to patient: Can change to as needed as hems improve   lidocaine 2 % jelly Commonly known as: XYLOCAINE Place 1 application into the urethra as needed (cathing).   methocarbamol 500 MG  tablet Commonly known as: ROBAXIN Take 1 tablet (500 mg total) by mouth 4 (four) times daily.   MULTIVITAMIN ADULT PO Take 1 tablet by mouth daily.   oxyCODONE 15 MG immediate release tablet--Rx#14 pills Commonly known as: ROXICODONE Take 1 tablet (15 mg total) by mouth 2 (two) times daily as needed.   oxyCODONE 10 mg 12 hr tablet--Rx#14 pills Commonly known as: OXYCONTIN Take 1 tablet (10 mg total) by mouth every 12 (twelve) hours.   polyethylene glycol 17 g packet Commonly known as: MIRALAX / GLYCOLAX Take 17 g by mouth 2 (two) times daily.   senna 8.6 MG Tabs tablet Commonly known as: SENOKOT Take 1 tablet (8.6 mg total) by mouth 2 (two) times daily.   tamsulosin 0.4 MG Caps capsule Commonly known as: FLOMAX Take 2 capsules (0.8 mg total) by mouth daily after supper.   traMADol 50 MG tablet--Rx #28 pills  Commonly known as: ULTRAM Take 1 tablet (50 mg total) by mouth every 6 (six) hours.   traZODone 50 MG tablet Commonly known as: DESYREL Take 0.5-1 tablets (25-50 mg total) by mouth at bedtime as needed for sleep.      Follow-up Information    Lovorn, Jinny Blossom, MD Follow up.   Specialty: Physical Medicine and Rehabilitation Why: Office will call you with follow up appointment Contact information: [address] [place] [address] [phone]        Hiram Gash, MD Follow up.   Specialty: Orthopedic Surgery Contact information: [address] [place] [address] [phone]        Shona Needles, MD. Schedule an appointment as soon as possible for a visit in 2 week(s).   Specialty: Orthopedic Surgery Why: for follow up x-rays of ankle Contact information: [place] [address] [phone]        Bjorn Loser, MD Follow up on 12/02/2019.   Specialty: Urology Why: Be there at 8:30 am for evaluation.  Contact information: [place] [address] [phone]            Signed: Bary Leriche 11/08/2019, 11:23 PM

## 2019-11-07 NOTE — Progress Notes (Signed)
Patient remains restful throughout shift, pain level remains at 101/10 with current schedule Oxy IR and schedule Ultram, stating" medications never reaches this pain goal 3., I/O cath, x2 this shift with volumes 500+ output x2., tolerated well. Anticipates discharging home on 11/08/19. Verbalizes concerns with pain and resolving discomfort to a tolerable level once discharged, (patient encourage to speak with MD/PA upon rounding today. Call bell and bed alarm In place Continue medical regime

## 2019-11-07 NOTE — Progress Notes (Signed)
[place] PHYSICAL MEDICINE & REHABILITATION PROGRESS NOTE   Subjective/Complaints:   Pt reports last 2 days getting up in [facility] in AM after breakfast when abdomen "cramping" and has been able to have good sized somewhat formed BM- pain/sensation around anal area with more noticeable.  Bowel program in evening going well with 2-3 dig stim episodes- wondering if needs all 3 dig stim- said could try without 3rd one.   Ready to discuss sexuality and intimacy tomorrow.   ROS: Patient denies fever, rash, sore throat, blurred vision, nausea, vomiting, diarrhea, cough, shortness of breath or chest pain, joint or back pain, headache, or mood change.     Objective:   No results found. Recent Labs    11/07/19 0555  WBC 7.9  HGB 12.0*  HCT 36.4*  PLT 368   Recent Labs    11/07/19 0555  NA 136  K 4.2  CL 98  CO2 31  GLUCOSE 101*  BUN 24*  CREATININE 0.75  CALCIUM 8.8*    Intake/Output Summary (Last 24 hours) at 11/07/2019 0900 Last data filed at 11/07/2019 0730 Gross per 24 hour  Intake 760 ml  Output 2125 ml  Net -1365 ml     Physical Exam: Vital Signs Blood pressure (!) 152/79, pulse 60, temperature 98.2 F (36.8 C), resp. rate 18, height 6' (1.829 m), weight 62 kg, SpO2 97 %.  Constitutional: No distress . Vital signs reviewed. Lying in bed; on R side; sling in place on L shoulder; bright affect, NAD HEENT: EOMI, oral membranes moist Neck: supple Cardiovascular: RRR without murmur. No JVD    Respiratory: CTA Bilaterally without wheezes or rales. Normal effort    GI: BS +, non-tender, non-distended  Skin: Left ankle with dressing C/D/I intact. Skin remains warm Psych: Normal mood.  Normal behavior. Musc: Left ankle with tr edema Neuro: Alert Right upper extremities 5/5 proximal distal Left upper extremity: Proximally limited by sling, hand grip 5/5 Right lower extremity: 5/5 proximal distal Left lower extremity: Hip flexion, knee extension 5/5, ankle braced,  stable motor ?mild sensory loss over lateral foot bilaterally ongoing  Assessment/Plan: 1. 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  Physiatrist is providing close team supervision and 24 hour management of active medical problems listed below.  Physiatrist and rehab team continue to assess barriers to discharge/monitor patient progress toward functional and medical goals  Care Tool:  Bathing  Bathing activity did not occur: Safety/medical concerns Body parts bathed by patient: Right arm, Left arm, Chest, Abdomen   Body parts bathed by helper: Buttocks(back)     Bathing assist Assist Level: Minimal Assistance - Patient > 75%     Upper Body Dressing/Undressing Upper body dressing   What is the patient wearing?: Pull over shirt    Upper body assist Assist Level: Maximal Assistance - Patient 25 - 49%    Lower Body Dressing/Undressing Lower body dressing      What is the patient wearing?: Underwear/pull up(short, ace wrap/soft cast to left leg lower leg)     Lower body assist Assist for lower body dressing: Moderate Assistance - Patient 50 - 74%     Toileting Toileting    Toileting assist Assist for toileting: Minimal Assistance - Patient > 75%     Transfers Chair/bed transfer  Transfers assist  Chair/bed transfer assist level: Supervision/Verbal cueing     Locomotion Ambulation   Ambulation assist   Ambulation activity did not occur: Safety/medical concerns          Walk 10 feet activity   Assist  Walk 10 feet activity did not occur: Safety/medical concerns        Walk 50 feet activity   Assist Walk 50 feet with 2 turns activity did not occur: Safety/medical concerns         Walk 150 feet activity   Assist Walk  150 feet activity did not occur: Safety/medical concerns         Walk 10 feet on uneven surface  activity   Assist Walk 10 feet on uneven surfaces activity did not occur: Safety/medical concerns         Wheelchair     Assist Will patient use wheelchair at discharge?: Yes Type of Wheelchair: Manual    Wheelchair assist level: Supervision/Verbal cueing Max wheelchair distance: 150'    Wheelchair 50 feet with 2 turns activity    Assist        Assist Level: Supervision/Verbal cueing   Wheelchair 150 feet activity     Assist      Assist Level: Supervision/Verbal cueing   Blood pressure (!) 152/79, pulse 60, temperature 98.2 F (36.8 C), resp. rate 18, height 6' (1.829 m), weight 62 kg, SpO2 97 %.  Medical Problem List and Plan: 1.Impaired mobility/ADLs/Functionsecondary to Cauda equina syndrome, L shoulder dislocation s/p repair (very unstable L shoulder) NWB x 6 weeks, and L trimalleolar fx s/p ORIF NWB 6+ weeks with neurogenic bowel and bladder.  Cont CIR PT OT 2. Antithrombotics: -DVT/anticoagulation:Pharmaceutical:Lovenox  11/23- will need a total of 3 months on lovenox since surgery- will need to go home on it.   -antiplatelet therapy: N/A 3. Pain Management: Post op pain and SCI AT LEVEL pain  On tylenol 1000 mg qid, ultram 25 mg qid, robaxin 500 mg qid, and Oxycodone prn.  Started gabapentin for nerve pain, increased on 11/12, tingling improved on 11/17.   OxyContin 10 twice daily started on 11/13  Improved control 11/21 4. Mood:LCSW to follow for evaluation and support -antipsychotic agents: N/A 5. Neuropsych: This patientiscapable of making decisions onhisown behalf. 6. Skin/Wound Care:Routine pressure relief measures. 7. Fluids/Electrolytes/Nutrition:Monitor I/O.  8. Left shoulder dislocation s/p arthroscopy: Sling with NWB X 5 more weeks. 9. ORIF Left trimalleolar ankle fracture: NWB LLE 10 ABLA:  Continue to monitor..  Hemoglobin 12.0 on 11/11--->10.8 today, no signs of blood loss  Fe++ supp  11. Neurogenic bowel:  KUB on 11/10 reviewed, showing mild constipation  Discontinued Celebrex as reports of abdominal pain  Change Senna S to plain senna.   Continue miralax.   Added Anusol HC cream for hems.  continue bowel program   11/19- discussed bowel program with day and night nurse to make sure has good passoff  11/21 emptying but still incontinent--bowel program with PM suppository in place 12. Neurogenic bladder:  Started Flomax, increased on 11/14  Continue I/O caths, coud caths ordered with [employer]  11/19- needs cath q4 hours- lay in bed between therapies to help night time dumps of urine/caths levels too high- pt drinking ~ 1500cc/day, but also getting IV ABX which increases fluid levels.  11/20- wife taught how to do in/out caths yesterday- working through concerns  11/21--increase urecholine to 50mg  tid   -needs to be up to toilet either sitting or standing to empty, double voids, massage, etc   -  UTI rx'ed 13. Steroid induced hyperglycemia:Question timing of weaning.  Continue to monitor 14.  Hypoalbuminemia  Supplement initiated on 11/14 15.  Acute lower UTI secondary to Pseudomonas aeruginosa  WBCs 10.0 11/16  11.21--dc cefipime. Has received 7 days 16. Sexual/intimacy issues- will d/w with pt and wife PRIOR to d/c.  11/23- will discuss sexuality in AM. Of note, getting some sensation in anal area.  17. Disposition: Patient has some questions he would like to discuss with his surgeon prior to his planned discharge on Tuesday, regarding his weightbearing status, bracing, and plan for cast removal. I advised him that these issues will be evaluated at his first surgical outpatient appointment, but that I would contact the surgeon's office to see if someone from the team may be able to see patient prior to his discharge or answer his questions via phone.        LOS: 13 days A FACE TO FACE EVALUATION WAS PERFORMED  Zeda Gangwer 11/07/2019, 9:00 AM

## 2019-11-07 NOTE — Progress Notes (Signed)
Physical Therapy Discharge Summary  Patient Details  Name: Louis Jordan MRN: [id number] Date of Birth: 1956-06-07  Today's Date: 11/07/2019  Patient has met 5 of 5 long term goals due to improved activity tolerance, improved balance, improved postural control, increased strength, increased range of motion, decreased pain, ability to compensate for deficits and improved coordination.  Patient to discharge at a wheelchair level Supervision.   Patient's care partner is independent to provide the necessary physical assistance at discharge. Patient's wife Louis Jordan has completed hands on family education and is safe to assist pt with donning his LSO, with transfers via squat pivot or stand pivot with HW, with setting up the w/c and managing w/c parts, and with assisting him with car transfers.  Reasons goals not met: Patient has met all rehab goals.  Recommendation:  Patient will benefit from ongoing skilled PT services in home health setting to continue to advance safe functional mobility, address ongoing impairments in strength, endurance, balance, safety, independence with functional mobility, and minimize fall risk.  Equipment: 16x16 manual w/c with ELR, hemiwalker  Reasons for discharge: treatment goals met and discharge from hospital  Patient/family agrees with progress made and goals achieved: Yes  PT Discharge Precautions/Restrictions Precautions Precautions: Fall;Back Precaution Comments: NWB LUE in sling Required Braces or Orthoses: Sling;Spinal Brace Spinal Brace: Applied in sitting position Spinal Brace Comments: custom LSO Restrictions Weight Bearing Restrictions: Yes LUE Weight Bearing: Non weight bearing LLE Weight Bearing: Non weight bearing Vision/Perception  Perception Perception: Within Functional Limits Praxis Praxis: Intact  Cognition Overall Cognitive Status: Within Functional Limits for tasks assessed Arousal/Alertness: Awake/alert Orientation Level: Oriented  X4 Attention: Alternating Focused Attention: Appears intact Selective Attention: Appears intact Alternating Attention: Appears intact Memory: Appears intact Awareness: Appears intact Problem Solving: Appears intact Safety/Judgment: Appears intact Sensation Sensation Light Touch: Impaired Detail Peripheral sensation comments: Sacral sensory impairment and along L4 dermatome Light Touch Impaired Details: Impaired RLE Proprioception: Appears Intact Coordination Gross Motor Movements are Fluid and Coordinated: No Fine Motor Movements are Fluid and Coordinated: No Coordination and Movement Description: impaired 2/2 multi trauma and WBing precautions Motor  Motor Motor: Abnormal postural alignment and control Motor - Skilled Clinical Observations: impaired 2/2 multi trauma and WBing precautions Motor - Discharge Observations: Impaired 2/2 multi trauma and WB precautions  Mobility Bed Mobility Bed Mobility: Rolling Right;Rolling Left;Supine to Sit;Sit to Supine Rolling Right: Independent with assistive device Rolling Left: (N/A 2/2 L shoulder immobilized) Supine to Sit: Independent with assistive device Sit to Supine: Independent with assistive device Transfers Transfers: Sit to Stand;Stand Pivot Transfers;Squat Pivot Transfers Sit to Stand: Supervision/Verbal cueing Stand Pivot Transfers: Supervision/Verbal cueing Stand Pivot Transfer Details: Verbal cues for technique Squat Pivot Transfers: Supervision/Verbal cueing Transfer (Assistive device): Hemi-walker Locomotion  Gait Ambulation: No Gait Gait: No Stairs / Additional Locomotion Stairs: No Ramp: Supervision/Verbal Location manager Mobility: Yes Wheelchair Assistance: Independent with assistive device Wheelchair Propulsion: Right upper extremity;Right lower extremity Wheelchair Parts Management: Needs assistance(just with L ELR due to precautions) Distance: 200  Trunk/Postural Assessment   Cervical Assessment Cervical Assessment: Within Functional Limits Thoracic Assessment Thoracic Assessment: Within Functional Limits Lumbar Assessment Lumbar Assessment: Exceptions to WFL(back precautions) Postural Control Postural Control: Deficits on evaluation Righting Reactions: impaired  Balance Balance Balance Assessed: Yes Static Sitting Balance Static Sitting - Balance Support: No upper extremity supported;Feet supported Static Sitting - Level of Assistance: 5: Stand by assistance Dynamic Sitting Balance Dynamic Sitting - Balance Support: No upper extremity supported;Feet supported;During functional activity Dynamic Sitting - Level  of Assistance: 5: Stand by assistance Static Standing Balance Static Standing - Balance Support: Right upper extremity supported;During functional activity Static Standing - Level of Assistance: 5: Stand by assistance Extremity Assessment  RUE Assessment RUE Assessment: Within Functional Limits LUE Assessment LUE Assessment: Exceptions to [facility] General Strength Comments: NWB LUE, in shoulder immobilizer gentle PROM ok to elbow RLE Assessment RLE Assessment: Within Functional Limits General Strength Comments: 4+/5 to 5/5 grossly LLE Assessment LLE Assessment: Exceptions to [facility] Passive Range of Motion (PROM) Comments: ankle ROM limited by recent surgery and cast General Strength Comments: 5/5 hip and knee, ankle testing deferred 2/2 recent surgery     Excell Seltzer, PT, DPT 11/07/2019, 11:54 AM

## 2019-11-07 NOTE — Progress Notes (Signed)
Orthopaedic Trauma Progress Note  S: Doing well this evening. No questions or concerns currently. Is anxious to go home tomorrow.  O:  Vitals:   11/07/19 0318 11/07/19 1512  BP: (!) 152/79 (!) 171/77  Pulse: 60 68  Resp: 18 18  Temp: 98.2 F (36.8 C) 97.8 F (36.6 C)  SpO2: 97% 97%    General - Sitting on bedside commode, NAD. Pleasant and cooperative  Left Lower Extremity - Dressing removed, incisions clean, dry, intact with steri-strips in place. No surround erythema. Mildly tender with palpation of medial and lateral ankle. Superficial abrasion to medial tibia. Able to wiggle toes, sensation intact to light touch of plantar and dorsal aspect of foot. Tolerates some ankle dorsiflexion/plantarflexion without significant discomfort. Foot warm and well perfused. Compartments soft and compressible. 2+ DP pulse  Imaging: Repeat imaging of left ankle done today shows plate and screws in excellent position, no signs of hardware failure or loosening. Fracture lines remain evident without significant displacement.    Labs:  Results for orders placed or performed during the hospital encounter of 10/25/19 (from the past 24 hour(s))  Basic metabolic panel     Status: Abnormal   Collection Time: 11/07/19  5:55 AM  Result Value Ref Range   Sodium 136 135 - 145 mmol/L   Potassium 4.2 3.5 - 5.1 mmol/L   Chloride 98 98 - 111 mmol/L   CO2 31 22 - 32 mmol/L   Glucose, Bld 101 (H) 70 - 99 mg/dL   BUN 24 (H) 8 - 23 mg/dL   Creatinine, Ser 0.75 0.61 - 1.24 mg/dL   Calcium 8.8 (L) 8.9 - 10.3 mg/dL   GFR calc non Af Amer >60 >60 mL/min   GFR calc Af Amer >60 >60 mL/min   Anion gap 7 5 - 15  CBC     Status: Abnormal   Collection Time: 11/07/19  5:55 AM  Result Value Ref Range   WBC 7.9 4.0 - 10.5 K/uL   RBC 3.70 (L) 4.22 - 5.81 MIL/uL   Hemoglobin 12.0 (L) 13.0 - 17.0 g/dL   HCT 36.4 (L) 39.0 - 52.0 %   MCV 98.4 80.0 - 100.0 fL   MCH 32.4 26.0 - 34.0 pg   MCHC 33.0 30.0 - 36.0 g/dL   RDW  15.1 11.5 - 15.5 %   Platelets 368 150 - 400 K/uL   nRBC 0.0 0.0 - 0.2 %    Assessment: 63 year old male s/p fall from ladder  Injuries: Left trimalleolar ankle fracture s/p ORIF 10/19/2019  Plan: - Continue NWB LLE in CAM boot - Okay to come out of boot to start some gentle ankle range of motion - Okay to leave incisions open to air  - Continue Lovenox for DVT prophylaxis - Will plan to follow up outpatient in 2-3 weeks     [place] Rollo A. Carm[facility] [phone] (office) [email or web address]

## 2019-11-07 NOTE — Progress Notes (Signed)
Physical Therapy Session Note  Patient Details  Name: Louis Jordan MRN: [id number] Date of Birth: 02/16/1956  Today's Date: 11/07/2019 PT Individual Time: 0800-0900; 1400-1453 PT Individual Time Calculation (min): 60 min and 53 min  Short Term Goals: Week 2:  PT Short Term Goal 1 (Week 2): =LTG due to ELOS  Skilled Therapeutic Interventions/Progress Updates:    Session 1: Pt received supine in bed, agreeable to PT session. Pt reports 5/10 pain in his back but also reports his pain is always at a 5/10. Pt is able to receive pain medication from RN at beginning of session. Pt is able to perform bed mobility at mod I level with increased time needed for safety from a flat bed. Pt is able to direct caregiver on how to assist him with donning LSO at bed level. Squat pivot transfer bed to/from w/c at Supervision level. Pt is able to verbalize to caregiver how w/c needs to be setup for a safe transfer. Manual w/c propulsion x 150 ft across a hard, level surface at mod I level with use of R UE/LE. Navigation through obstacle course with w/c going forwards weaving through cones and then backwards weaving through cones at mod I level to simulate home environment with tight and narrow spaces. Assisted pt back to bed at end of session at Supervision level. Pt left supine in bed with needs in reach at end of session.  Session 2: Pt received supine in bed, agreeable to PT session. No complaints of pain. Pt had bowel smear in brief, max A for pericare and brief change for time conservation. Pt is max A to don LSO while supine and sidelying in bed. Bed mobility mod I. Squat pivot transfer to w/c with Supervision. Session focus on w/c management through functional spaces of elevators, up/down ramps outdoors, and across uneven ground at mod I level. Discussed pt's HEP, safety upon d/c home, etc. Pt reports feeling somewhat anxious about d/c home but feels more reassured that he will be successful at home following  discussion about the progress he has made so far. Pt requesting to transfer to commode at end of session. Squat pivot transfer w/c to commode with Supervision. Assisted pt with doffing pants and brief. Pt left seated on commode with call button in reach.  Therapy Documentation Precautions:  Precautions Precautions: Fall, Back Precaution Comments: NWB LUE in sling Required Braces or Orthoses: Sling, Spinal Brace Spinal Brace: Applied in sitting position Spinal Brace Comments: custom LSO Restrictions Weight Bearing Restrictions: Yes LUE Weight Bearing: Non weight bearing LLE Weight Bearing: Non weight bearing    Therapy/Group: Individual Therapy   Excell Seltzer, PT, DPT  11/07/2019, 11:54 AM

## 2019-11-07 NOTE — Progress Notes (Signed)
Occupational Therapy Discharge Summary  Patient Details  Name: Greg Galvis MRN: [id number] Date of Birth: 10/14/1956  Today's Date: 11/07/2019 OT Individual Time: 0938-1055 OT Individual Time Calculation (min): 77 min    Patient has met 6 of 6 long term goals due to improved activity tolerance, improved balance, postural control and ability to compensate for deficits.  Patient to discharge at overall Supervision level.  Patient's care partner is independent to provide the necessary physical assistance at discharge.  [name] has attended family education training.   Reasons goals not met: All goals met.   Recommendation:  Patient will benefit from ongoing skilled OT services in home health setting to continue to advance functional skills in the area of BADL and iADL.  Equipment: No equipment provided. Pt independently acquiring BSC and ramp.   Reasons for discharge: treatment goals met and discharge from hospital  Patient/family agrees with progress made and goals achieved: Yes   Skilled OT Intervention: Pt received supine with no c/o pain. Pt completed bed mobility, rolling R for LSO to be donned supine. Pt demonstrated excellent caregiver instruction in care. Pt completed supine> sit with mod I, use of bed rail. Pt able to squat pivot to w/c with (S). Pt sat at sink and completed grooming tasks, shaving with set up assist and some assist provided for posterior head. Mod A to doff shirt seated. Pt able to complete UB bathing with min A. Max A to don shirt, excellent caregiver instruction. Pt donned pants with mod A. Sit <> stand with (S) throughout session, use of hemi walker. Pt returned to supine in bed and LSO was doffed. Pt ranged through gentle supported elbow only ROM. Shoulder immobilized throughout and brace not removed other than to expose elbow. Discussed return to ADLs/IADLs and normalcy, as well as emotional adjustment to injury. Pt left supine with all needs met.   OT  Discharge Precautions/Restrictions  Precautions Precautions: Fall;Back Precaution Comments: NWB LUE in sling Required Braces or Orthoses: Sling;Spinal Brace Spinal Brace: Applied in sitting position Spinal Brace Comments: custom LSO Restrictions Weight Bearing Restrictions: Yes LUE Weight Bearing: Non weight bearing LLE Weight Bearing: Non weight bearing Vision Baseline Vision/History: No visual deficits Patient Visual Report: No change from baseline Vision Assessment?: No apparent visual deficits Perception  Perception: Within Functional Limits Praxis Praxis: Intact Cognition Overall Cognitive Status: Within Functional Limits for tasks assessed Arousal/Alertness: Awake/alert Orientation Level: Oriented X4 Attention: Alternating Focused Attention: Appears intact Selective Attention: Appears intact Alternating Attention: Appears intact Memory: Appears intact Awareness: Appears intact Problem Solving: Appears intact Safety/Judgment: Appears intact Sensation Sensation Light Touch: Impaired Detail Peripheral sensation comments: Sacral sensory impairment and along L4 dermatome Light Touch Impaired Details: Impaired RLE Proprioception: Appears Intact Coordination Gross Motor Movements are Fluid and Coordinated: No Fine Motor Movements are Fluid and Coordinated: No Coordination and Movement Description: impaired 2/2 multi trauma and WBing precautions Motor  Motor Motor: Abnormal postural alignment and control Motor - Discharge Observations: Impaired 2/2 multi trauma and WB precautions Mobility  Bed Mobility Bed Mobility: Rolling Right;Rolling Left;Supine to Sit;Sit to Supine Rolling Right: Independent with assistive device Rolling Left: (N/A 2/2 L shoulder immobilized) Supine to Sit: Independent with assistive device Sit to Supine: Independent with assistive device Transfers Sit to Stand: Supervision/Verbal cueing  Trunk/Postural Assessment  Cervical  Assessment Cervical Assessment: Within Functional Limits Thoracic Assessment Thoracic Assessment: Within Functional Limits Lumbar Assessment Lumbar Assessment: Exceptions to WFL(back precautions) Postural Control Postural Control: Deficits on evaluation Righting Reactions: impaired  Balance   Balance Balance Assessed: Yes Static Sitting Balance Static Sitting - Balance Support: No upper extremity supported;Feet supported Static Sitting - Level of Assistance: 5: Stand by assistance Dynamic Sitting Balance Dynamic Sitting - Balance Support: No upper extremity supported;Feet supported;During functional activity Dynamic Sitting - Level of Assistance: 5: Stand by assistance Static Standing Balance Static Standing - Balance Support: Right upper extremity supported;During functional activity Static Standing - Level of Assistance: 5: Stand by assistance Extremity/Trunk Assessment RUE Assessment RUE Assessment: Within Functional Limits LUE Assessment LUE Assessment: Exceptions to WFL General Strength Comments: NWB LUE, in shoulder immobilizer gentle PROM ok to elbow   Sandra H Davis 11/07/2019, 10:04 AM 

## 2019-11-08 MED ORDER — LIDOCAINE HCL URETHRAL/MUCOSAL 2 % EX GEL: CUTANEOUS | Status: DC | PRN

## 2019-11-08 MED ORDER — METHOCARBAMOL 500 MG PO TABS: 500.0000 mg | ORAL_TABLET | Freq: Four times a day (QID) | ORAL | 0 refills | Status: DC

## 2019-11-08 MED ORDER — POLYETHYLENE GLYCOL 3350 17 G PO PACK: 17.0000 g | PACK | Freq: Two times a day (BID) | ORAL | 0 refills | Status: DC

## 2019-11-08 MED ORDER — TAMSULOSIN HCL 0.4 MG PO CAPS: 0.8000 mg | ORAL_CAPSULE | Freq: Every day | ORAL | 0 refills | Status: DC

## 2019-11-08 MED ORDER — ACETAMINOPHEN 325 MG PO TABS: 650.0000 mg | ORAL_TABLET | Freq: Four times a day (QID) | ORAL | Status: DC | PRN

## 2019-11-08 MED ORDER — DOCUSATE SODIUM 100 MG PO CAPS: 100.0000 mg | ORAL_CAPSULE | Freq: Two times a day (BID) | ORAL | 0 refills | Status: AC

## 2019-11-08 MED ORDER — XTAMPZA ER 9 MG PO C12A: 9.0000 mg | EXTENDED_RELEASE_CAPSULE | Freq: Two times a day (BID) | ORAL | 0 refills | Status: DC

## 2019-11-08 MED ORDER — LIDOCAINE HCL URETHRAL/MUCOSAL 2 % EX GEL: 1.0000 "application " | CUTANEOUS | 1 refills | Status: DC | PRN

## 2019-11-08 MED ORDER — OXYCODONE HCL 15 MG PO TABS: 15.0000 mg | ORAL_TABLET | ORAL | 0 refills | Status: DC | PRN

## 2019-11-08 MED ORDER — OXYCODONE HCL ER 10 MG PO T12A: 10.0000 mg | EXTENDED_RELEASE_TABLET | Freq: Two times a day (BID) | ORAL | 0 refills | Status: DC

## 2019-11-08 MED ORDER — TRAZODONE HCL 50 MG PO TABS: 25.0000 mg | ORAL_TABLET | Freq: Every evening | ORAL | 0 refills | Status: DC | PRN

## 2019-11-08 MED ORDER — SENNA 8.6 MG PO TABS: 1.0000 | ORAL_TABLET | Freq: Two times a day (BID) | ORAL | 0 refills | Status: AC

## 2019-11-08 MED ORDER — DEXAMETHASONE 4 MG PO TABS: ORAL_TABLET | ORAL | 0 refills | Status: DC

## 2019-11-08 MED ORDER — GABAPENTIN 400 MG PO CAPS: 400.0000 mg | ORAL_CAPSULE | Freq: Three times a day (TID) | ORAL | 0 refills | Status: DC

## 2019-11-08 MED ORDER — TRAMADOL HCL 50 MG PO TABS: 50.0000 mg | ORAL_TABLET | Freq: Four times a day (QID) | ORAL | 0 refills | Status: DC

## 2019-11-08 MED ORDER — FERROUS SULFATE 325 (65 FE) MG PO TABS: 325.0000 mg | ORAL_TABLET | Freq: Every day | ORAL | 3 refills | Status: DC

## 2019-11-08 MED ORDER — BISACODYL 10 MG RE SUPP: 10.0000 mg | Freq: Every day | RECTAL | 0 refills | Status: AC

## 2019-11-08 MED ORDER — HYDROCORTISONE (PERIANAL) 2.5 % EX CREA: TOPICAL_CREAM | Freq: Three times a day (TID) | CUTANEOUS | 0 refills | Status: AC

## 2019-11-08 MED ORDER — OXYCODONE HCL 15 MG PO TABS: 15.0000 mg | ORAL_TABLET | Freq: Two times a day (BID) | ORAL | 0 refills | Status: DC | PRN

## 2019-11-08 MED ORDER — ENOXAPARIN SODIUM 40 MG/0.4ML ~~LOC~~ SOLN: 40.0000 mg | SUBCUTANEOUS | 1 refills | Status: DC

## 2019-11-08 NOTE — Discharge Instructions (Signed)
Inpatient Rehab Discharge Instructions  [facility] Discharge date and time:  11/08/19  Activities/Precautions/ Functional Status: Activity: activity as tolerated Diet: regular diet Wound Care: keep wound clean and dry    Functional status:  ___ No restrictions     ___ Walk up steps independently _X__ 24/7 supervision/assistance   ___ Walk up steps with assistance ___ Intermittent supervision/assistance  ___ Bathe/dress independently ___ Walk with walker     _X__ Bathe/dress with assistance ___ Walk Independently    ___ Shower independently ___ Walk with assistance    ___ Shower with assistance _X__ No alcohol     ___ Return to work/school ________   Special Instructions: 1. Perform foley care twice a day and after BM.  2. No weight on left arm. Keep sling on at all times.  3. Back precautions. Wear brace at bedside.    Indwelling Urinary Catheter Care, Adult An indwelling urinary catheter is a thin tube that is put into your bladder. The tube helps to drain pee (urine) out of your body. The tube goes in through your urethra. Your urethra is where pee comes out of your body. Your pee will come out through the catheter, then it will go into a bag (drainage bag). Take good care of your catheter so it will work well. How to wear your catheter and bag Supplies needed  Sticky tape (adhesive tape) or a leg strap.  Alcohol wipe or soap and water (if you use tape).  A clean towel (if you use tape).  Large overnight bag.  Smaller bag (leg bag). Wearing your catheter Attach your catheter to your leg with tape or a leg strap.  Make sure the catheter is not pulled tight.  If a leg strap gets wet, take it off and put on a dry strap.  If you use tape to hold the bag on your leg: 1. Use an alcohol wipe or soap and water to wash your skin where the tape made it sticky before. 2. Use a clean towel to pat-dry that skin. 3. Use new tape to make the bag stay on your leg. Wearing your  bags You should have been given a large overnight bag.  You may wear the overnight bag in the day or night.  Always have the overnight bag lower than your bladder.  Do not let the bag touch the floor.  Before you go to sleep, put a clean plastic bag in a wastebasket. Then hang the overnight bag inside the wastebasket. You should also have a smaller leg bag that fits under your clothes.  Always wear the leg bag below your knee.  Do not wear your leg bag at night. How to care for your skin and catheter Supplies needed  A clean washcloth.  Water and mild soap.  A clean towel. Caring for your skin and catheter      Clean the skin around your catheter every day: ? Wash your hands with soap and water. ? Wet a clean washcloth in warm water and mild soap. ? Clean the skin around your urethra. ? If you are male: ? Gently spread the folds of skin around your vagina (labia). ? With the washcloth in your other hand, wipe the inner side of your labia on each side. Wipe from front to back. ? If you are male: ? Pull back any skin that covers the end of your penis (foreskin). ? With the washcloth in your other hand, wipe your penis in small circles. Start  wiping at the tip of your penis, then move away from the catheter. ? Move the foreskin back in place, if needed. ? With your free hand, hold the catheter close to where it goes into your body. ? Keep holding the catheter during cleaning so it does not get pulled out. ? With the washcloth in your other hand, clean the catheter. ? Only wipe downward on the catheter. ? Do not wipe upward toward your body. Doing this may push germs into your urethra and cause infection. ? Use a clean towel to pat-dry the catheter and the skin around it. Make sure to wipe off all soap. ? Wash your hands with soap and water.  Shower every day. Do not take baths.  Do not use cream, ointment, or lotion on the area where the catheter goes into your body,  unless your doctor tells you to.  Do not use powders, sprays, or lotions on your genital area.  Check your skin around the catheter every day for signs of infection. Check for: ? Redness, swelling, or pain. ? Fluid or blood. ? Warmth. ? Pus or a bad smell. How to empty the bag Supplies needed  Rubbing alcohol.  Gauze pad or cotton ball.  Tape or a leg strap. Emptying the bag Pour the pee out of your bag when it is ?- full, or at least 2-3 times a day. Do this for your overnight bag and your leg bag. 1. Wash your hands with soap and water. 2. Separate (detach) the bag from your leg. 3. Hold the bag over the toilet or a clean pail. Keep the bag lower than your hips and bladder. This is so the pee (urine) does not go back into the tube. 4. Open the pour spout. It is at the bottom of the bag. 5. Empty the pee into the toilet or pail. Do not let the pour spout touch any surface. 6. Put rubbing alcohol on a gauze pad or cotton ball. 7. Use the gauze pad or cotton ball to clean the pour spout. 8. Close the pour spout. 9. Attach the bag to your leg with tape or a leg strap. 10. Wash your hands with soap and water. Follow instructions for cleaning the drainage bag:  From the product maker.  As told by your doctor. How to change the bag Supplies needed  Alcohol wipes.  A clean bag.  Tape or a leg strap. Changing the bag Replace your bag when it starts to leak, smell bad, or look dirty. 1. Wash your hands with soap and water. 2. Separate the dirty bag from your leg. 3. Pinch the catheter with your fingers so that pee does not spill out. 4. Separate the catheter tube from the bag tube where these tubes connect (at the connection valve). Do not let the tubes touch any surface. 5. Clean the end of the catheter tube with an alcohol wipe. Use a different alcohol wipe to clean the end of the bag tube. 6. Connect the catheter tube to the tube of the clean bag. 7. Attach the clean bag  to your leg with tape or a leg strap. Do not make the bag tight on your leg. 8. Wash your hands with soap and water. General rules   Never pull on your catheter. Never try to take it out. Doing that can hurt you.  Always wash your hands before and after you touch your catheter or bag. Use a mild, fragrance-free soap. If you do not  have soap and water, use hand sanitizer.  Always make sure there are no twists or bends (kinks) in the catheter tube.  Always make sure there are no leaks in the catheter or bag.  Drink enough fluid to keep your pee pale yellow.  Do not take baths, swim, or use a hot tub.  If you are male, wipe from front to back after you poop (have a bowel movement). Contact a doctor if:  Your pee is cloudy.  Your pee smells worse than usual.  Your catheter gets clogged.  Your catheter leaks.  Your bladder feels full. Get help right away if:  You have redness, swelling, or pain where the catheter goes into your body.  You have fluid, blood, pus, or a bad smell coming from the area where the catheter goes into your body.  Your skin feels warm where the catheter goes into your body.  You have a fever.  You have pain in your: ? Belly (abdomen). ? Legs. ? Lower back. ? Bladder.  You see blood in the catheter.  Your pee is pink or red.  You feel sick to your stomach (nauseous).  You throw up (vomit).  You have chills.  Your pee is not draining into the bag.  Your catheter gets pulled out. Summary  An indwelling urinary catheter is a thin tube that is placed into the bladder to help drain pee (urine) out of the body.  The catheter is placed into the part of the body that drains pee from the bladder (urethra).  Taking good care of your catheter will keep it working properly and help prevent problems.  Always wash your hands before and after touching your catheter or bag.  Never pull on your catheter or try to take it out. This information is  not intended to replace advice given to you by your health care provider. Make sure you discuss any questions you have with your health care provider. Document Released: 03/28/2013 Document Revised: 03/25/2019 Document Reviewed: 07/17/2017 Elsevier Patient Education  2020 [employer]    My questions have been answered and I understand these instructions. I will adhere to these goals and the provided educational materials after my discharge from the hospital.  Patient/Caregiver Signature _______________________________ Date __________  Clinician Signature _______________________________________ Date __________  Please bring this form and your medication list with you to all your follow-up doctor's appointments.

## 2019-11-08 NOTE — Progress Notes (Signed)
Recreational Therapy Discharge Summary Patient Details  Name: [name] MRN: [id number] Date of Birth: [birth year] Today's Date: 11/08/2019  Long term goals set: 1  Long term goals met: 1  Comments on progress toward goals: Pt has made good progress during LOS and is ready for discharge home with wife today. Pt remains extremely motivated to regain further independence. TR sessions focused on community skills training w/c level, leisure adaptations (cooking), pt education on coping strategies, activity analysis with potential adaptations & discharge planning.  Hand outs provided on activity analysis and diaphragmatic breathing.  Pt is discharging at supervision w/c level for community reintegration tasks.    Reasons for discharge: discharge from hospital  Patient/family agrees with progress made and goals achieved: Yes  Kinzleigh Kandler 11/08/2019, 8:41 AM

## 2019-11-08 NOTE — Plan of Care (Signed)
  Problem: Consults Goal: RH SPINAL CORD INJURY PATIENT EDUCATION Description:  See Patient Education module for education specifics.  Outcome: Completed/Met Goal: Skin Care Protocol Initiated - if Braden Score 18 or less Description: If consults are not indicated, leave blank or document N/A Outcome: Completed/Met   Problem: SCI BOWEL ELIMINATION Goal: RH STG MANAGE BOWEL WITH ASSISTANCE Description: STG Manage Bowel with mod I Assistance. Outcome: Completed/Met Goal: RH STG SCI MANAGE BOWEL WITH MEDICATION WITH ASSISTANCE Description: STG SCI Manage bowel with medication with mod I assistance. Outcome: Completed/Met Goal: RH STG SCI MANAGE BOWEL PROGRAM W/ASSIST OR AS APPROPRIATE Description: STG SCI Manage bowel program with mod assist or as appropriate. Outcome: Completed/Met   Problem: SCI BLADDER ELIMINATION Goal: RH STG MANAGE BLADDER WITH ASSISTANCE Description: STG Manage Bladder With min Assistance Outcome: Completed/Met Goal: RH STG MANAGE BLADDER WITH MEDICATION WITH ASSISTANCE Description: STG Manage Bladder With Medication With mod I Assistance. Outcome: Completed/Met Goal: RH STG MANAGE BLADDER WITH EQUIPMENT WITH ASSISTANCE Description: STG Manage Bladder With Equipment With min Assistance Outcome: Completed/Met   Problem: RH SKIN INTEGRITY Goal: RH STG SKIN FREE OF INFECTION/BREAKDOWN Description: Pt will be free of skin breakdown or infection with min assist  Outcome: Completed/Met   Problem: RH PAIN MANAGEMENT Goal: RH STG PAIN MANAGED AT OR BELOW PT'S PAIN GOAL Description: Less than 4 on 0-10 scale Outcome: Completed/Met   Problem: RH KNOWLEDGE DEFICIT SCI Goal: RH STG INCREASE KNOWLEDGE OF SELF CARE AFTER SCI Description: Pt and family will be able to manage self care of fractures and perform bowel program with min assist prior to discharge. Pt will also be able to verbalize fall precautions to use at home.  Outcome: Completed/Met

## 2019-11-08 NOTE — Progress Notes (Signed)
[facility] PHYSICAL MEDICINE & REHABILITATION PROGRESS NOTE   Subjective/Complaints:   Pt reports we are having difficulty getting in/out caths that his wife can use due to her spasticity- will arrange to get pt a foley for the next 4-6 weeks and arrange H/H to change q month- went over infection risk slightly more with foley than in/out caths but has increased risk regardless.   ROS: Patient denies fever, rash, sore throat, blurred vision, nausea, vomiting, diarrhea, cough, shortness of breath or chest pain, joint or back pain, headache, or mood change.     Objective:   Left Ankle  Result Date: 11/07/2019 CLINICAL DATA:  Post left ankle ORIF. EXAM: LEFT ANKLE COMPLETE - 3+ VIEW COMPARISON:  Radiographs 10/19/2019. FINDINGS: The splint has been removed. The hardware is intact status post medial tibial plate and screw fixation and screw fixation of the distal fibula. The underlying fracture lines remain evident without significant displacement. 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 No new findings. IMPRESSION: Intact hardware and stable appearance status post ORIF for fractures of the distal tibia and fibula. Electronically Signed   By: Carey Bullocks M.D.   On: 11/07/2019 16:44   Recent Labs    11/07/19 0555  WBC 7.9  HGB 12.0*  HCT 36.4*  PLT 368   Recent Labs    11/07/19 0555  NA 136  K 4.2  CL 98  CO2 31  GLUCOSE 101*  BUN 24*  CREATININE 0.75  CALCIUM 8.8*    Intake/Output Summary (Last 24 hours) at 11/08/2019 1117 Last data filed at 11/08/2019 0817 Gross per 24 hour  Intake 300 ml  Output 2100 ml  Net -1800 ml     Physical Exam: Vital Signs Blood pressure 140/83, pulse (!) 55, temperature 98.2 F (36.8 C), resp. rate 18, height 6' (1.829 m), weight 58.3 kg, SpO2 97 %.  Constitutional: No distress . Vital signs reviewed. Lying in bed; on R side; sling in place on L shoulder; bright affect, very anxious due to d/c today; wife at bedside;  NAD HEENT: EOMI, oral membranes moist Neck: supple Cardiovascular: RRR without murmur. No JVD    Respiratory: CTA Bilaterally without wheezes or rales. Normal effort    GI: BS +, non-tender, non-distended  Skin: Left ankle with dressing C/D/I intact. Skin remains warm Psych: Normal mood.  Normal behavior. Musc: Left ankle with tr edema Neuro: Alert Right upper extremities 5/5 proximal distal Left upper extremity: Proximally limited by sling, hand grip 5/5 Right lower extremity: 5/5 proximal distal Left lower extremity: Hip flexion, knee extension 5/5, ankle braced, stable motor ?mild sensory loss over lateral foot bilaterally ongoing  Assessment/Plan: 1. 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  Physiatrist is providing close team supervision and 24 hour management of active medical problems listed below.  Physiatrist and rehab team continue to assess barriers to discharge/monitor patient progress toward functional and medical goals  Care Tool:  Bathing  Bathing activity did not occur: Safety/medical concerns Body parts bathed by patient: Right arm, Left arm, Chest, Abdomen, Front perineal area, Buttocks, Right upper leg, Left upper leg, Right lower leg, Face   Body parts bathed by helper: Buttocks(back)     Bathing assist Assist Level: Minimal Assistance - Patient > 75%     Upper Body Dressing/Undressing Upper  body dressing   What is the patient wearing?: Pull over shirt    Upper body assist Assist Level: Maximal Assistance - Patient 25 - 49%    Lower Body Dressing/Undressing Lower body dressing      What is the patient wearing?: Underwear/pull up     Lower body assist Assist for lower body dressing: Moderate Assistance - Patient 50 - 74%      Toileting Toileting    Toileting assist Assist for toileting: Supervision/Verbal cueing     Transfers Chair/bed transfer  Transfers assist     Chair/bed transfer assist level: Supervision/Verbal cueing     Locomotion Ambulation   Ambulation assist   Ambulation activity did not occur: Safety/medical concerns          Walk 10 feet activity   Assist  Walk 10 feet activity did not occur: Safety/medical concerns        Walk 50 feet activity   Assist Walk 50 feet with 2 turns activity did not occur: Safety/medical concerns         Walk 150 feet activity   Assist Walk 150 feet activity did not occur: Safety/medical concerns         Walk 10 feet on uneven surface  activity   Assist Walk 10 feet on uneven surfaces activity did not occur: Safety/medical concerns         Wheelchair     Assist Will patient use wheelchair at discharge?: Yes Type of Wheelchair: Manual    Wheelchair assist level: Independent Max wheelchair distance: 150'    Wheelchair 50 feet with 2 turns activity    Assist        Assist Level: Independent   Wheelchair 150 feet activity     Assist      Assist Level: Independent   Blood pressure 140/83, pulse (!) 55, temperature 98.2 F (36.8 C), resp. rate 18, height 6' (1.829 m), weight 58.3 kg, SpO2 97 %.  Medical Problem List and Plan: 1.Impaired mobility/ADLs/Functionsecondary to Cauda equina syndrome, L shoulder dislocation s/p repair (very unstable L shoulder) NWB x 6 weeks, and L trimalleolar fx s/p ORIF NWB 6+ weeks with neurogenic bowel and bladder.  Cont CIR PT OT 2. Antithrombotics: -DVT/anticoagulation:Pharmaceutical:Lovenox  11/23- will need a total of 3 months on lovenox since surgery- will need to go home on it.   -antiplatelet therapy: N/A 3. Pain Management: Post op pain and SCI AT LEVEL pain  On tylenol 1000 mg qid, ultram 25 mg qid, robaxin 500 mg qid, and  Oxycodone prn.  Started gabapentin for nerve pain, increased on 11/12, tingling improved on 11/17.   OxyContin 10 twice daily started on 11/13  Improved control 11/21 4. Mood:LCSW to follow for evaluation and support -antipsychotic agents: N/A 5. Neuropsych: This patientiscapable of making decisions onhisown behalf. 6. Skin/Wound Care:Routine pressure relief measures. 7. Fluids/Electrolytes/Nutrition:Monitor I/O.  8. Left shoulder dislocation s/p arthroscopy: Sling with NWB X 5 more weeks. 9. ORIF Left trimalleolar ankle fracture: NWB LLE 10 ABLA: Continue to monitor..  Hemoglobin 12.0 on 11/11--->10.8 today, no signs of blood loss  Fe++ supp  11. Neurogenic bowel:  KUB on 11/10 reviewed, showing mild constipation  Discontinued Celebrex as reports of abdominal pain  Change Senna S to plain senna.   Continue miralax.   Added Anusol HC cream for hems.  continue bowel program   11/19- discussed bowel program with day and night nurse to make sure has good passoff  11/21 emptying but still incontinent--bowel program with  PM suppository in place 12. Neurogenic bladder:  Started Flomax, increased on 11/14  Continue I/O caths, coud caths ordered with [employer]  11/19- needs cath q4 hours- lay in bed between therapies to help night time dumps of urine/caths levels too high- pt drinking ~ 1500cc/day, but also getting IV ABX which increases fluid levels.  11/20- wife taught how to do in/out caths yesterday- working through concerns  11/21--increase urecholine to 50mg  tid   -needs to be up to toilet either sitting or standing to empty, double voids, massage, etc   -UTI rx'ed 13. Steroid induced hyperglycemia:Question timing of weaning.  Continue to monitor 14.  Hypoalbuminemia  Supplement initiated on 11/14 15.  Acute lower UTI secondary to Pseudomonas aeruginosa  WBCs 10.0 11/16  11.21--dc cefipime. Has received 7 days 16. Sexual/intimacy issues- will d/w  with pt and wife PRIOR to d/c.  11/23- will discuss sexuality in AM. Of note, getting some sensation in anal area.  17. Disposition: Patient has some questions he would like to discuss with his surgeon prior to his planned discharge on Tuesday, regarding his weightbearing status, bracing, and plan for cast removal. I advised him that these issues will be evaluated at his first surgical outpatient appointment, but that I would contact the surgeon's office to see if someone from the team may be able to see patient prior to his discharge or answer his questions via phone.     11/24- spent an additional 60 minutes going over intimacy/prognosis/sexuality/bowel and bladder personal face to face time today for pt/wife- in addition to regular d/c.    LOS: 14 days A FACE TO FACE EVALUATION WAS PERFORMED  Aveen Stansel 11/08/2019, 11:17 AM

## 2019-11-08 NOTE — Progress Notes (Signed)
Pt discharged home with wife. Indwelling coude catheter inserted today at 1200. Education provided on foley care and Lovenox injection to pt and wife. Wife administered today's dose of Lovenox and performed foley care. Wife also educated on bowel program. Wife demonstrated proper dig stim and insertion of suppository on 11/07/2019 Discharge instructions given by Jeannene Patella, PA. Equipment and belongings sent with pt. No further questions from pt or wife. Administered pain med prior to discharge per pt request. Pt stable at time of discharge.  Gerald Stabs, RN

## 2019-11-09 ENCOUNTER — Telehealth: Payer: Self-pay | Admitting: Registered Nurse

## 2019-11-09 ENCOUNTER — Telehealth: Payer: Self-pay | Admitting: *Deleted

## 2019-11-09 NOTE — Telephone Encounter (Signed)
Placed a call to [name] regarding TC appointment, no answer. Left message on both of their lines. Address and appointment time with phone number. Awaiting a return call.

## 2019-11-09 NOTE — Progress Notes (Signed)
Social Work Discharge Note   The overall goal for the admission was met for:   Discharge location: Yes  Length of Stay: Yes  Discharge activity level: Yes  Home/community participation: Yes  Services provided included: MD, RD, PT, OT, RN, Pharmacy and SW  Financial Services: Private Insurance: [employer]  Follow-up services arranged: Home Health: PT, OT via [facility], DME: 16x16 lightweight w/c with ELRs, basic cushion, hemi walker and tub bench via [facility], Other: had ordered I/O cath supplies via [facility], however, on day of d/c the plan was changed that pt would d/c with a foley and follow up with Dr. Dagoberto Ligas in the office. and Patient/Family has no preference for HH/DME agencies  Comments (or additional information):    Contact person, pt @ [phone] or     Wife, Saadiq Poche @ [phone]  Patient/Family verbalized understanding of follow-up arrangements: Yes  Individual responsible for coordination of the follow-up plan: pt  Confirmed correct DME delivered: Lennart Pall 11/09/2019    Kieron Kantner

## 2019-11-09 NOTE — Telephone Encounter (Signed)
[facility] called for POC of 1wk4.  Approval given.

## 2019-11-14 ENCOUNTER — Encounter
Payer: Commercial Managed Care - PPO | Attending: Physical Medicine and Rehabilitation | Admitting: Physical Medicine and Rehabilitation

## 2019-11-14 ENCOUNTER — Other Ambulatory Visit: Payer: Self-pay

## 2019-11-14 ENCOUNTER — Encounter: Payer: Self-pay | Admitting: Physical Medicine and Rehabilitation

## 2019-11-14 VITALS — BP 136/84 | Temp 97.5°F

## 2019-11-14 DIAGNOSIS — T07XXXA Unspecified multiple injuries, initial encounter: Secondary | ICD-10-CM | POA: Diagnosis present

## 2019-11-14 DIAGNOSIS — G834 Cauda equina syndrome: Secondary | ICD-10-CM | POA: Insufficient documentation

## 2019-11-14 DIAGNOSIS — S24103A Unspecified injury at T7-T10 level of thoracic spinal cord, initial encounter: Secondary | ICD-10-CM

## 2019-11-14 MED ORDER — OXYCODONE HCL 5 MG PO TABS: 5.0000 mg | ORAL_TABLET | ORAL | 0 refills | Status: DC | PRN

## 2019-11-14 MED ORDER — TRAMADOL HCL 50 MG PO TABS: 50.0000 mg | ORAL_TABLET | Freq: Four times a day (QID) | ORAL | 0 refills | Status: DC

## 2019-11-14 MED ORDER — GABAPENTIN 600 MG PO TABS: 600.0000 mg | ORAL_TABLET | Freq: Three times a day (TID) | ORAL | 0 refills | Status: DC

## 2019-11-14 MED ORDER — XTAMPZA ER 9 MG PO C12A: 9.0000 mg | EXTENDED_RELEASE_CAPSULE | Freq: Two times a day (BID) | ORAL | 0 refills | Status: DC

## 2019-11-14 NOTE — Progress Notes (Signed)
Subjective:    Patient ID: Louis Jordan, male    DOB: [birth year], 63 y.o.   MRN: [id number]  HPI He feels tired.  He continues to have pain all the time and the medication helps him when the pain is severe. He knows when he can go without it. He takes the prn at 10pm and that helps him until 4-5am. He also takes that Robaxin and Trazodone and Tylenol at night. He is only taking the Oxycodone at night. If he wakes up at night in pain, he takes the Tramadol for pain. He only has three more pills of Oxycodone. He takes the Xtampza every morning and night. He takes the gabapentin three times per day and that really helps him.   Discusses his daily schedule with his wife and they are tired, but happy with his progress.   His wife asks whether a small amount if discharge from his penis is normal.  He drinks no water. He drinks Miralax with orange juice. He drinks two diet cokes per day.   He continues to have numbness around his anus. He is able to feel when he uses dig stim. He uses hemorrhoidal cream. Application of this cream also makes him need to use the bathroom. He is doing his butt exercises. He usually has BM twice per day and he is doing that with his current regimen.   Pain Inventory Average Pain 6 Pain Right Now 6 My pain is constant, dull, tingling and aching  In the last 24 hours, has pain interfered with the following? General activity 10 Relation with others 10 Enjoyment of life 10 What TIME of day is your pain at its worst? all Sleep (in general) Fair  Pain is worse with: unsure Pain improves with: rest and medication Relief from Meds: 7  Mobility ability to climb steps?  no do you drive?  no use a wheelchair needs help with transfers  Function employed # of hrs/week 0  Neuro/Psych bladder control problems bowel control problems weakness numbness tingling trouble walking confusion depression anxiety  Prior Studies hospital f/u  Physicians involved in  [facility] hospital f/u   Family History  Problem Relation Age of Onset  . Heart disease Mother   . Alzheimer's disease Mother   . Pancreatic cancer Father   . High blood pressure Brother    Social History   Socioeconomic History  . Marital status: Married    Spouse name: Not on file  . Number of children: Not on file  . Years of education: Not on file  . Highest education level: Not on file  Occupational History  . Not on file  Social Needs  . Financial resource strain: Not on file  . Food insecurity    Worry: Not on file    Inability: Not on file  . Transportation needs    Medical: Not on file    Non-medical: Not on file  Tobacco Use  . Smoking status: Never Smoker  . Smokeless tobacco: Never Used  Substance and Sexual Activity  . Alcohol use: Yes    Comment: RARE  . Drug use: Never  . Sexual activity: Not on file  Lifestyle  . Physical activity    Days per week: Not on file    Minutes per session: Not on file  . Stress: Not on file  Relationships  . Social Musician on phone: Not on file    Gets together: Not on file  Attends religious service: Not on file    Active member of club or organization: Not on file    Attends meetings of clubs or organizations: Not on file    Relationship status: Not on file  Other Topics Concern  . Not on file  Social History Narrative  . Not on file   Past Surgical History:  Procedure Laterality Date  . APPENDECTOMY    . KNEE SURGERY Right   . LUMBAR SPINE SURGERY  10/16/2019   L1 burst fracture with epidural hematoma and canal stenosis  . OPEN REDUCTION INTERNAL FIXATION (ORIF) TIBIA/FIBULA FRACTURE Left 10/19/2019   Procedure: OPEN REDUCTION INTERNAL FIXATION (ORIF) TIBIA/FIBULA FRACTURE;  Surgeon: Shona Needles, MD;  Location: [facility];  Service: Orthopedics;  Laterality: Left;  . SHOULDER ARTHROSCOPY WITH BANKART REPAIR Left 10/19/2019   Procedure: SHOULDER ARTHROSCOPY WITH BANKART REPAIR, EXTENSIVE  DEBRIDEMENT, LOOSE BODY REMOVAL;  Surgeon: Hiram Gash, MD;  Location: [facility];  Service: Orthopedics;  Laterality: Left;  . STRABISMUS SURGERY    . TONSILLECTOMY     Past Medical History:  Diagnosis Date  . Bundle branch block, right   . Complication of anesthesia   . Hand pain    mild OA bilateral  hands  . Left knee injury 1970   requiring surgery for repair of ligaments/cartilage damage with nerve injury that took 20 years to resolve  . MVP (mitral valve prolapse)   . PONV (postoperative nausea and vomiting)    BP 136/84   Temp (!) 97.5 F (36.4 C)   Opioid Risk Score:   Fall Risk Score:  `1  Depression screen PHQ 2/9  No flowsheet data found.  Review of Systems  Constitutional: Negative.   HENT: Negative.   Eyes: Negative.   Respiratory: Negative.   Cardiovascular: Negative.   Gastrointestinal:       Neurogenic bowel  Endocrine: Negative.  Polyphagia: tingling.  Genitourinary: Positive for urgency.       Neurogenic bladder  Musculoskeletal: Positive for gait problem.  Allergic/Immunologic: Negative.   Neurological: Positive for weakness and numbness.  Psychiatric/Behavioral: Positive for confusion. The patient is nervous/anxious.   All other systems reviewed and are negative.      Objective:   Physical Exam  Gen: no distress, normal appearing HEENT: oral mucosa pink and moist, NCAT Cardio: Reg rate Chest: normal effort, normal rate of breathing Abd: soft, non-distended Ext: no edema Skin: intact Neuro: AOx3.  Musculoskeletal: Left arm in sling. Left foot in boot. In wheelchair  Psych: pleasant, normal affect  Assessment & Plan:  [name] is a 63 year old man presenting with hospital follow up following his spinal cord injury.  Pain control: -Will reorder the Oxycodone IR and ER and Gabapentin. -I have decreased the Oxycodone dose to 5mg  and advised him to take two pills at night to decrease the total dose form 15mg  HS to 10mg  HS -I will maintain  current dose of Oxy ER -I have ordered a higher dose of Gabapentin 600mg  TID. Advised him to continue taking his 400mg  tablets during the day and to take one 600mg  at night. This may allow him to better tolerate the lower dose of Oxy. If he feels drowsy in the morning, he can cut his Trazodone in half or stop it completely as I would prefer Gabapentin which will help with both his pain and sleep.  Daily routine:  -Continue with current daily routine which is both physically and emotionally beneficial. He has an amazing support system in  his wife and recognizes this.  Bowel program: -Continue twice daily Miralax and dig stim which results in twice daily bowel movements.  Hemorrhoids: -Continue hemorrhoidal cream  Mood: -Currently stable. Has some low mood in evenings and cries but lasts about 15 minutes due to support from his wife. Continues to be functional and motivated.   Follow-up: -He has follow up with the spinal cord surgeon, shoulder surgeon, and foot surgeon within the coming week.  Sixty minutes of face to face patient care time were spent during this visit. All questions were encouraged and answered. Follow up with Dr. Berline ChoughLovorn in 2 weeks.

## 2019-11-15 ENCOUNTER — Telehealth: Payer: Self-pay | Admitting: *Deleted

## 2019-11-15 NOTE — Telephone Encounter (Signed)
Patient seen in clinic 11/14/2019

## 2019-11-15 NOTE — Telephone Encounter (Signed)
I reached out to the patient as requested. It went to voicemail. I left a message asking patient to contact my number, which I provided.  I also proactively submitted a prior auth for the xtampza 9mg  ER. Hopefully patient will call soon and we can try to get this moving in the right direction.

## 2019-11-15 NOTE — Telephone Encounter (Signed)
-----   Message from Lowella Curb, Shirley sent at 11/15/2019  9:37 AM EST ----- Regarding: pain meds Good morning to you both, This patient contacted me this morning (I was his Education officer, museum while he was here on CIR) and asked that I help with the problems of getting his pain med covered by his insurance.  Pam Love had worked on this when he was discharged.  I reached out to her and she said that, since he was seen by Dr. Ranell Patrick yesterday, "the office will need to handle that."  Can someone follow up with the patient and discuss the status of his med coverage with him?  His cell # is [phone].  Thanks so much, [employer], LCSW

## 2019-11-16 ENCOUNTER — Telehealth: Payer: Self-pay

## 2019-11-16 MED ORDER — XTAMPZA ER 9 MG PO C12A: 9.0000 mg | EXTENDED_RELEASE_CAPSULE | Freq: Two times a day (BID) | ORAL | 0 refills | Status: DC

## 2019-11-16 NOTE — Telephone Encounter (Signed)
Sent rx.

## 2019-11-16 NOTE — Telephone Encounter (Signed)
Needs 1 month supply Rx- will write today and cancel 1 week supply sent in by Dr Ranell Patrick

## 2019-11-16 NOTE — Telephone Encounter (Signed)
Erlene Quan, OT with [facility] called requesting VO HHOT 2wk8. Orders approved and given per discharge summary.

## 2019-11-16 NOTE — Telephone Encounter (Signed)
I have received an approval for Xtampza 9mg  (#60 capsules per 30 days).  Patient saw Dr. Ranell Patrick on 11/14/2019. Patient was give a 7 day script (#14) of the [facility].  They have an appointment scheduled with Dr. Dagoberto Ligas 11/28/2019.  They are asking if Dr. Dagoberto Ligas would consider cancelling the 1 week script that Dr. Ranell Patrick gave and resubmit a script for a one month supply or at least a script that would cover until next visit (which would be a 2 week supply).

## 2019-11-20 ENCOUNTER — Other Ambulatory Visit: Payer: Self-pay

## 2019-11-20 ENCOUNTER — Encounter (HOSPITAL_BASED_OUTPATIENT_CLINIC_OR_DEPARTMENT_OTHER): Payer: Self-pay

## 2019-11-20 ENCOUNTER — Observation Stay (HOSPITAL_BASED_OUTPATIENT_CLINIC_OR_DEPARTMENT_OTHER)
Admission: EM | Admit: 2019-11-20 | Discharge: 2019-11-22 | Disposition: A | Discharge status: Home / Self Care | Payer: Commercial Managed Care - PPO | Attending: Internal Medicine | Admitting: Internal Medicine

## 2019-11-20 ENCOUNTER — Emergency Department (HOSPITAL_BASED_OUTPATIENT_CLINIC_OR_DEPARTMENT_OTHER): Payer: Commercial Managed Care - PPO

## 2019-11-20 DIAGNOSIS — M19042 Primary osteoarthritis, left hand: Secondary | ICD-10-CM | POA: Diagnosis not present

## 2019-11-20 DIAGNOSIS — R112 Nausea with vomiting, unspecified: Secondary | ICD-10-CM | POA: Diagnosis present

## 2019-11-20 DIAGNOSIS — M19041 Primary osteoarthritis, right hand: Secondary | ICD-10-CM | POA: Diagnosis not present

## 2019-11-20 DIAGNOSIS — Z880 Allergy status to penicillin: Secondary | ICD-10-CM | POA: Insufficient documentation

## 2019-11-20 DIAGNOSIS — Z20828 Contact with and (suspected) exposure to other viral communicable diseases: Secondary | ICD-10-CM | POA: Insufficient documentation

## 2019-11-20 DIAGNOSIS — K567 Ileus, unspecified: Principal | ICD-10-CM | POA: Insufficient documentation

## 2019-11-20 LAB — CBC WITH DIFFERENTIAL/PLATELET
Abs Immature Granulocytes: 0.05 10*3/uL (ref 0.00–0.07)
Basophils Absolute: 0 10*3/uL (ref 0.0–0.1)
Basophils Relative: 0 %
Eosinophils Absolute: 0 10*3/uL (ref 0.0–0.5)
Eosinophils Relative: 0 %
HCT: 41.2 % (ref 39.0–52.0)
Hemoglobin: 13.4 g/dL (ref 13.0–17.0)
Immature Granulocytes: 0 %
Lymphocytes Relative: 2 %
Lymphs Abs: 0.3 10*3/uL — ABNORMAL LOW (ref 0.7–4.0)
MCH: 32.4 pg (ref 26.0–34.0)
MCHC: 32.5 g/dL (ref 30.0–36.0)
MCV: 99.8 fL (ref 80.0–100.0)
Monocytes Absolute: 0.5 10*3/uL (ref 0.1–1.0)
Monocytes Relative: 4 %
Neutro Abs: 13.5 10*3/uL — ABNORMAL HIGH (ref 1.7–7.7)
Neutrophils Relative %: 94 %
Platelets: 335 10*3/uL (ref 150–400)
RBC: 4.13 MIL/uL — ABNORMAL LOW (ref 4.22–5.81)
RDW: 14.7 % (ref 11.5–15.5)
WBC: 14.4 10*3/uL — ABNORMAL HIGH (ref 4.0–10.5)
nRBC: 0 % (ref 0.0–0.2)

## 2019-11-20 LAB — COMPREHENSIVE METABOLIC PANEL
ALT: 27 U/L (ref 0–44)
AST: 16 U/L (ref 15–41)
Albumin: 3.2 g/dL — ABNORMAL LOW (ref 3.5–5.0)
Alkaline Phosphatase: 86 U/L (ref 38–126)
Anion gap: 7 (ref 5–15)
BUN: 19 mg/dL (ref 8–23)
CO2: 29 mmol/L (ref 22–32)
Calcium: 8.7 mg/dL — ABNORMAL LOW (ref 8.9–10.3)
Chloride: 100 mmol/L (ref 98–111)
Creatinine, Ser: 0.8 mg/dL (ref 0.61–1.24)
GFR calc Af Amer: >60 mL/min (ref >60)
GFR calc non Af Amer: >60 mL/min (ref >60)
Glucose, Bld: 126 mg/dL — ABNORMAL HIGH (ref 70–99)
Potassium: 3.8 mmol/L (ref 3.5–5.1)
Sodium: 136 mmol/L (ref 135–145)
Total Bilirubin: 0.6 mg/dL (ref 0.3–1.2)
Total Protein: 5.8 g/dL — ABNORMAL LOW (ref 6.5–8.1)

## 2019-11-20 LAB — URINALYSIS, ROUTINE W REFLEX MICROSCOPIC
Bilirubin Urine: NEGATIVE
Glucose, UA: NEGATIVE mg/dL
Ketones, ur: NEGATIVE mg/dL
Nitrite: NEGATIVE
Protein, ur: NEGATIVE mg/dL
Specific Gravity, Urine: 1.02 (ref 1.005–1.030)
pH: 7.5 (ref 5.0–8.0)

## 2019-11-20 LAB — URINALYSIS, MICROSCOPIC (REFLEX): WBC, UA: >50 WBC/hpf (ref 0–5)

## 2019-11-20 LAB — CBG MONITORING, ED: Glucose-Capillary: 104 mg/dL — ABNORMAL HIGH (ref 70–99)

## 2019-11-20 MED ORDER — IOHEXOL 300 MG/ML  SOLN
100.0000 mL | Freq: Once | INTRAMUSCULAR | Status: AC | PRN
  Administered 2019-11-20: 100 mL via INTRAVENOUS

## 2019-11-20 MED ORDER — ONDANSETRON HCL 4 MG/2ML IJ SOLN
4.0000 mg | Freq: Once | INTRAMUSCULAR | Status: AC
  Administered 2019-11-20: 4 mg via INTRAVENOUS
  Filled 2019-11-20: qty 2

## 2019-11-20 MED ORDER — SODIUM CHLORIDE 0.9 % IV BOLUS
1000.0000 mL | Freq: Once | INTRAVENOUS | Status: AC
  Administered 2019-11-20: 1000 mL via INTRAVENOUS

## 2019-11-20 MED ORDER — FENTANYL CITRATE (PF) 100 MCG/2ML IJ SOLN
100.0000 ug | Freq: Once | INTRAMUSCULAR | Status: AC
  Administered 2019-11-20: 100 ug via INTRAVENOUS
  Filled 2019-11-20: qty 2

## 2019-11-20 NOTE — ED Notes (Signed)
Ice chips provided.

## 2019-11-20 NOTE — ED Notes (Signed)
ED Provider at bedside. 

## 2019-11-20 NOTE — ED Provider Notes (Addendum)
[facility] EMERGENCY DEPARTMENT Provider Note   CSN: [id number] Arrival date & time: 11/20/19  1831     History   Chief Complaint Chief Complaint  Patient presents with   Emesis    HPI Louis Jordan is a 63 y.o. male.     The history is provided by the patient, the spouse and medical records. No language interpreter was used.  Emesis  Louis Jordan is a 63 y.o. male who presents to the Emergency Department complaining of nausea and vomiting. He presents to the emergency department accompanied by his wife for evaluation of sudden onset nausea and vomiting that began around three this afternoon. He was discharged from a rehab facility on November 23 following hospitalization and rehabilitation following a fall with complications of a one-person fracture with cauda equina as well as a left shoulder dislocation and a left ankle fracture. He has a Foley catheter in place. He has been on his medications as prescribed. He is been eating and drinking without difficulty. Last bowel movement was today was normal. He had a mild headache yesterday that is worsening today. This afternoon he developed sudden onset nausea with multiple episodes of emesis. No reports of fever, cough, shortness of breath, new numbness. He is currently living with his wife. No known COVID 19 exposures. No drainage or increased pain from a surgical sites. Past Medical History:  Diagnosis Date   Bundle branch block, right    Complication of anesthesia    Hand pain    mild OA bilateral  hands   Left knee injury 1970   requiring surgery for repair of ligaments/cartilage damage with nerve injury that took 20 years to resolve   MVP (mitral valve prolapse)    PONV (postoperative nausea and vomiting)     Patient Active Problem List   Diagnosis Date Noted   Nausea & vomiting 11/21/2019   Hypoalbuminemia due to protein-calorie malnutrition (HCC)    Acute blood loss anemia    Postoperative  pain    Neuropathic pain    Neurogenic bladder 10/26/2019   Neurogenic bowel 10/26/2019   Spinal cord injury, 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   Shoulder dislocation, left, initial encounter    Closed burst fracture of lumbar vertebra (HCC)    Cauda equina syndrome (HCC)    Closed fracture of left ankle    Fall from ladder 10/22/2019   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   Closed displaced trimalleolar fracture of left ankle 10/22/2019   S/P lumbar fusion 10/16/2019    Past Surgical History:  Procedure Laterality Date   APPENDECTOMY     KNEE SURGERY Right    LUMBAR SPINE SURGERY  10/16/2019   L1 burst fracture with epidural hematoma and canal stenosis   OPEN REDUCTION INTERNAL FIXATION (ORIF) TIBIA/FIBULA FRACTURE Left 10/19/2019   Procedure: OPEN REDUCTION INTERNAL FIXATION (ORIF) TIBIA/FIBULA FRACTURE;  Surgeon: Roby Lofts, MD;  Location: [facility];  Service: Orthopedics;  Laterality: Left;   SHOULDER ARTHROSCOPY WITH BANKART REPAIR Left 10/19/2019   Procedure: SHOULDER ARTHROSCOPY WITH BANKART REPAIR, EXTENSIVE DEBRIDEMENT, LOOSE BODY REMOVAL;  Surgeon: Bjorn Pippin, MD;  Location: [facility];  Service: Orthopedics;  Laterality: Left;   STRABISMUS SURGERY     TONSILLECTOMY          Home Medications    Prior to Admission medications   Medication Sig Start Date End Date Taking? Authorizing Provider  acetaminophen (TYLENOL) 325 MG tablet Take 2 tablets (650 mg total) by mouth  every 6 (six) hours as needed. 11/08/19   Love, Evlyn Kanner, PA-C  bisacodyl (DULCOLAX) 10 MG suppository Place 1 suppository (10 mg total) rectally daily after supper. 11/08/19   Love, Evlyn Kanner, PA-C  dexamethasone (DECADRON) 4 MG tablet Take one pill three times a day for 4 days. Then decrease to one pill twice a day for 4 days. Then decrease to one per day till gone. 11/08/19   Love, Evlyn Kanner, PA-C  docusate sodium (COLACE) 100 MG capsule Take 1 capsule (100 mg total) by  mouth 2 (two) times daily. 11/08/19   Love, Evlyn Kanner, PA-C  enoxaparin (LOVENOX) 40 MG/0.4ML injection Inject 0.4 mLs (40 mg total) into the skin daily. 11/08/19   Love, Evlyn Kanner, PA-C  ferrous sulfate 325 (65 FE) MG tablet Take 1 tablet (325 mg total) by mouth daily with breakfast. 11/08/19   Love, Evlyn Kanner, PA-C  gabapentin (NEURONTIN) 600 MG tablet Take 1 tablet (600 mg total) by mouth 3 (three) times daily. 11/14/19   Raulkar, Drema Pry, MD  hydrocortisone (ANUSOL-HC) 2.5 % rectal cream Place rectally 3 (three) times daily. 11/08/19   Love, Evlyn Kanner, PA-C  lidocaine (XYLOCAINE) 2 % jelly Place 1 application into the urethra as needed (cathing). 11/08/19   Love, Evlyn Kanner, PA-C  methocarbamol (ROBAXIN) 500 MG tablet Take 1 tablet (500 mg total) by mouth 4 (four) times daily. 11/08/19   Love, Evlyn Kanner, PA-C  Multiple Vitamins-Minerals (MULTIVITAMIN ADULT PO) Take 1 tablet by mouth daily.    [provider]  oxyCODONE (ROXICODONE) 5 MG immediate release tablet Take 1 tablet (5 mg total) by mouth every 4 (four) hours as needed for severe pain. 11/14/19   Raulkar, Drema Pry, MD  oxyCODONE ER (XTAMPZA ER) 9 MG C12A Take 9 mg by mouth 2 (two) times daily. 11/14/19   Raulkar, Drema Pry, MD  oxyCODONE ER (XTAMPZA ER) 9 MG C12A Take 9 mg by mouth 2 (two) times daily. 11/16/19   Lovorn, Aundra Millet, MD  polyethylene glycol (MIRALAX / GLYCOLAX) 17 g packet Take 17 g by mouth 2 (two) times daily. 11/08/19   Love, Evlyn Kanner, PA-C  senna (SENOKOT) 8.6 MG TABS tablet Take 1 tablet (8.6 mg total) by mouth 2 (two) times daily. 11/08/19   Love, Evlyn Kanner, PA-C  tamsulosin (FLOMAX) 0.4 MG CAPS capsule Take 2 capsules (0.8 mg total) by mouth daily after supper. 11/08/19   Love, Evlyn Kanner, PA-C  traMADol (ULTRAM) 50 MG tablet Take 1 tablet (50 mg total) by mouth every 6 (six) hours. 11/14/19   Raulkar, Drema Pry, MD  traZODone (DESYREL) 50 MG tablet Take 0.5-1 tablets (25-50 mg total) by mouth at bedtime as needed for  sleep. 11/08/19   Love, Evlyn Kanner, PA-C    Family History Family History  Problem Relation Age of Onset   Heart disease Mother    Alzheimer's disease Mother    Pancreatic cancer Father    High blood pressure Brother     Social History Social History   Tobacco Use   Smoking status: Never Smoker   Smokeless tobacco: Never Used  Substance Use Topics   Alcohol use: Yes    Comment: RARE   Drug use: Never     Allergies   Penicillins   Review of Systems Review of Systems  Gastrointestinal: Positive for vomiting.  All other systems reviewed and are negative.    Physical Exam Updated Vital Signs BP 138/85    Pulse 76    Temp  98.3 F (36.8 C) (Oral)    Resp 20    Ht 6' (1.829 m)    Wt 68 kg    SpO2 93%    BMI 20.34 kg/m   Physical Exam Vitals signs and nursing note reviewed.  Constitutional:      Appearance: He is well-developed.     Comments: Uncomfortable appearing  HENT:     Head: Normocephalic and atraumatic.  Cardiovascular:     Rate and Rhythm: Normal rate and regular rhythm.     Heart sounds: No murmur.  Pulmonary:     Effort: Pulmonary effort is normal. No respiratory distress.     Breath sounds: Normal breath sounds.  Abdominal:     Palpations: Abdomen is soft.     Tenderness: There is no abdominal tenderness. There is no guarding or rebound.  Genitourinary:    Comments: Foley catheter in place. Musculoskeletal:        General: No tenderness.     Comments: Splint to the left upper extremity, wiggles fingers. Splint to the left lower extremity.  Skin:    General: Skin is warm and dry.  Neurological:     Mental Status: He is alert and oriented to person, place, and time.     Comments: Pupils equal round and reactive. EOM I. No asymmetry of facial movements. Five out of five strength in right upper and right lower extremity. Strength testing limited in left upper and left lower extremity due to splinting.  Psychiatric:        Behavior: Behavior  normal.      ED Treatments / Results  Labs (all labs ordered are listed, but only abnormal results are displayed) Labs Reviewed  COMPREHENSIVE METABOLIC PANEL - Abnormal; Notable for the following components:      Result Value   Glucose, Bld 126 (*)    Calcium 8.7 (*)    Total Protein 5.8 (*)    Albumin 3.2 (*)    All other components within normal limits  CBC WITH DIFFERENTIAL/PLATELET - Abnormal; Notable for the following components:   WBC 14.4 (*)    RBC 4.13 (*)    Neutro Abs 13.5 (*)    Lymphs Abs 0.3 (*)    All other components within normal limits  URINALYSIS, ROUTINE W REFLEX MICROSCOPIC - Abnormal; Notable for the following components:   APPearance CLOUDY (*)    Hgb urine dipstick MODERATE (*)    Leukocytes,Ua MODERATE (*)    All other components within normal limits  URINALYSIS, MICROSCOPIC (REFLEX) - Abnormal; Notable for the following components:   Bacteria, UA FEW (*)    All other components within normal limits  CBG MONITORING, ED - Abnormal; Notable for the following components:   Glucose-Capillary 104 (*)    All other components within normal limits  URINE CULTURE  SARS CORONAVIRUS 2 AG (30 MIN TAT)    EKG None  Radiology Ct Head Wo Contrast  Result Date: 11/20/2019 CLINICAL DATA:  Headache vomiting. EXAM: CT HEAD WITHOUT CONTRAST TECHNIQUE: Contiguous axial images were obtained from the base of the skull through the vertex without intravenous contrast. COMPARISON:  None FINDINGS: Brain: No evidence of acute infarction, hemorrhage, hydrocephalus, extra-axial collection or mass lesion/mass effect. Vascular: No hyperdense vessel or unexpected calcification. Skull: Normal. Negative for fracture or focal lesion. Sinuses/Orbits: No acute finding. Other: None. IMPRESSION: No acute intracranial abnormality Electronically Signed   By: Donzetta Kohut M.D.   On: 11/20/2019 21:43   Ct Abdomen Pelvis W Contrast  Result Date: 11/20/2019 CLINICAL DATA:  Vomiting.  EXAM: CT ABDOMEN AND PELVIS WITH CONTRAST TECHNIQUE: Multidetector CT imaging of the abdomen and pelvis was performed using the standard protocol following bolus administration of intravenous contrast. CONTRAST:  100mL OMNIPAQUE IOHEXOL 300 MG/ML  SOLN COMPARISON:  None FINDINGS: Lower chest: Lung bases with basilar atelectasis. Hepatobiliary: No sign of focal hepatic lesion. Mild distension of the gallbladder without pericholecystic stranding. No signs of biliary ductal dilation. Pancreas: Unremarkable. No pancreatic ductal dilatation or surrounding inflammatory changes. Spleen: Normal in size without focal abnormality. Adrenals/Urinary Tract: Normal appearance of bilateral adrenal glands. Symmetric enhancement of bilateral kidneys. Stomach/Bowel: No sign of acute gastrointestinal process. Stool fills much of the colon. There are also mildly distended loops of small bowel which are filled with stool like material. No pericolonic or perienteric stranding. Vascular/Lymphatic: No signs of acute vascular process with atherosclerotic calcification and noncalcified plaque in the abdominal aorta extending in the iliac vessels. Reproductive: Prostate with heterogeneity. Other: No signs of free air. Foley catheter in the urinary bladder. Mild perivesical stranding and mural stratification of the bladder wall. Musculoskeletal: Signs of posterior fusion and L1 burst fracture. Fusion spans the T10 through L4 levels with laminectomy changes at the L1 level also with morselized bone grafting along the posterior elements. IMPRESSION: 1. Findings of constipation. Potential for concomitant ileus given some mildly distended stool-filled loops of small bowel. 2. Under distended urinary bladder shows some perivesical stranding, correlate with any clinical evidence of cystitis. 3. Aortic atherosclerosis. 4. Signs of recent postoperative changes in the spine spanning an L1 burst fracture. No acute bone process. Aortic Atherosclerosis  (ICD10-I70.0). Electronically Signed   By: Donzetta KohutGeoffrey  Wile M.D.   On: 11/20/2019 21:37    Procedures Procedures (including critical care time)  Medications Ordered in ED Medications  HYDROmorphone (DILAUDID) injection 1 mg (has no administration in time range)  ondansetron (ZOFRAN) injection 4 mg (4 mg Intravenous Given 11/20/19 1912)  sodium chloride 0.9 % bolus 1,000 mL (0 mLs Intravenous Stopped 11/20/19 2019)  fentaNYL (SUBLIMAZE) injection 100 mcg (100 mcg Intravenous Given 11/20/19 1959)  ondansetron (ZOFRAN) injection 4 mg (4 mg Intravenous Given 11/20/19 1957)  iohexol (OMNIPAQUE) 300 MG/ML solution 100 mL (100 mLs Intravenous Contrast Given 11/20/19 2049)  ondansetron (ZOFRAN) injection 4 mg (4 mg Intravenous Given 11/20/19 2220)  LORazepam (ATIVAN) injection 1 mg (1 mg Intravenous Given 11/21/19 0034)     Initial Impression / Assessment and Plan / ED Course  I have reviewed the triage vital signs and the nursing notes.  Pertinent labs & imaging results that were available during my care of the patient were reviewed by me and considered in my medical decision making (see chart for details).        Patient with recent discharge home from rehab facility for L1 burst fracture with cauda equina an indwelling Foley here for evaluation of nausea and vomiting. CT head obtained given patient's use of anticoagulation and complaint of mild headache. CT without acute abnormality. CT abdomen pelvis obtained which demonstrates evidence of possible ileus as well as evidence of constipation. Plan to treat constipation with anima the emergency department and reassess patient. CBC with mild leukocytosis. Current clinical patient pressure is not consistent with serious bacterial infection or Foley catheter infection.  Attempted enema times two with minimal return of stool. Patient with persistent nausea, dry heaved and abdominal discomfort. Given findings of ileus plan to admit for further symptom  management. Hospitalist consulted for admission. Patient and wife updated  findings of studies recommendation for admission and their in agreement with treatment plan.  Final Clinical Impressions(s) / ED Diagnoses   Final diagnoses:  Ileus [facility])    ED Discharge Orders    None       Quintella Reichert, MD 11/21/19 0106    Quintella Reichert, MD 11/21/19 [phone]

## 2019-11-20 NOTE — ED Triage Notes (Signed)
Pt arrives with wife with c/o nausea and vomiting starting today. Pt was recently discharged from hospital and does not have nausea medication at home. Pt also states that he feels like he is dehydrated.

## 2019-11-20 NOTE — ED Notes (Signed)
Patient assisted up to bedside commode.

## 2019-11-21 ENCOUNTER — Telehealth: Payer: Self-pay

## 2019-11-21 ENCOUNTER — Observation Stay (HOSPITAL_COMMUNITY): Payer: Commercial Managed Care - PPO

## 2019-11-21 DIAGNOSIS — R112 Nausea with vomiting, unspecified: Secondary | ICD-10-CM

## 2019-11-21 DIAGNOSIS — K567 Ileus, unspecified: Principal | ICD-10-CM | POA: Diagnosis present

## 2019-11-21 LAB — COMPREHENSIVE METABOLIC PANEL
ALT: 24 U/L (ref 0–44)
AST: 13 U/L — ABNORMAL LOW (ref 15–41)
Albumin: 3.1 g/dL — ABNORMAL LOW (ref 3.5–5.0)
Alkaline Phosphatase: 84 U/L (ref 38–126)
Anion gap: 11 (ref 5–15)
BUN: 13 mg/dL (ref 8–23)
CO2: 28 mmol/L (ref 22–32)
Calcium: 9 mg/dL (ref 8.9–10.3)
Chloride: 98 mmol/L (ref 98–111)
Creatinine, Ser: 0.65 mg/dL (ref 0.61–1.24)
GFR calc Af Amer: >60 mL/min (ref >60)
GFR calc non Af Amer: >60 mL/min (ref >60)
Glucose, Bld: 99 mg/dL (ref 70–99)
Potassium: 4.1 mmol/L (ref 3.5–5.1)
Sodium: 137 mmol/L (ref 135–145)
Total Bilirubin: 0.5 mg/dL (ref 0.3–1.2)
Total Protein: 6.2 g/dL — ABNORMAL LOW (ref 6.5–8.1)

## 2019-11-21 LAB — CBC WITH DIFFERENTIAL/PLATELET
Abs Immature Granulocytes: 0.06 10*3/uL (ref 0.00–0.07)
Basophils Absolute: 0 10*3/uL (ref 0.0–0.1)
Basophils Relative: 0 %
Eosinophils Absolute: 0 10*3/uL (ref 0.0–0.5)
Eosinophils Relative: 0 %
HCT: 42.7 % (ref 39.0–52.0)
Hemoglobin: 13.7 g/dL (ref 13.0–17.0)
Immature Granulocytes: 1 %
Lymphocytes Relative: 7 %
Lymphs Abs: 0.8 10*3/uL (ref 0.7–4.0)
MCH: 32.9 pg (ref 26.0–34.0)
MCHC: 32.1 g/dL (ref 30.0–36.0)
MCV: 102.4 fL — ABNORMAL HIGH (ref 80.0–100.0)
Monocytes Absolute: 0.6 10*3/uL (ref 0.1–1.0)
Monocytes Relative: 5 %
Neutro Abs: 9.2 10*3/uL — ABNORMAL HIGH (ref 1.7–7.7)
Neutrophils Relative %: 87 %
Platelets: 331 10*3/uL (ref 150–400)
RBC: 4.17 MIL/uL — ABNORMAL LOW (ref 4.22–5.81)
RDW: 14.7 % (ref 11.5–15.5)
WBC: 10.7 10*3/uL — ABNORMAL HIGH (ref 4.0–10.5)
nRBC: 0 % (ref 0.0–0.2)

## 2019-11-21 LAB — PHOSPHORUS: Phosphorus: 3.6 mg/dL (ref 2.5–4.6)

## 2019-11-21 LAB — SARS CORONAVIRUS 2 AG (30 MIN TAT): SARS Coronavirus 2 Ag: NEGATIVE

## 2019-11-21 LAB — TSH: TSH: 1.547 u[IU]/mL (ref 0.350–4.500)

## 2019-11-21 LAB — SARS CORONAVIRUS 2 (TAT 6-24 HRS): SARS Coronavirus 2: NEGATIVE

## 2019-11-21 LAB — MAGNESIUM: Magnesium: 2.1 mg/dL (ref 1.7–2.4)

## 2019-11-21 MED ORDER — TRAZODONE HCL 50 MG PO TABS
25.0000 mg | ORAL_TABLET | Freq: Every evening | ORAL | Status: DC | PRN
  Administered 2019-11-21: 50 mg via ORAL
  Filled 2019-11-21: qty 1

## 2019-11-21 MED ORDER — SENNA 8.6 MG PO TABS
1.0000 | ORAL_TABLET | Freq: Two times a day (BID) | ORAL | Status: DC
  Administered 2019-11-21 – 2019-11-22 (×2): 8.6 mg via ORAL
  Filled 2019-11-21 (×4): qty 1

## 2019-11-21 MED ORDER — TAMSULOSIN HCL 0.4 MG PO CAPS: 0.8000 mg | ORAL_CAPSULE | Freq: Every day | ORAL | Status: DC

## 2019-11-21 MED ORDER — GABAPENTIN 300 MG PO CAPS
600.0000 mg | ORAL_CAPSULE | Freq: Three times a day (TID) | ORAL | Status: DC
  Administered 2019-11-21: 600 mg via ORAL
  Filled 2019-11-21: qty 2

## 2019-11-21 MED ORDER — OXYCODONE HCL ER 15 MG PO T12A: 15.0000 mg | EXTENDED_RELEASE_TABLET | Freq: Two times a day (BID) | ORAL | Status: DC

## 2019-11-21 MED ORDER — BISACODYL 10 MG RE SUPP
10.0000 mg | Freq: Every day | RECTAL | Status: DC
  Administered 2019-11-22: 10 mg via RECTAL
  Filled 2019-11-21: qty 1

## 2019-11-21 MED ORDER — OXYCODONE HCL 5 MG PO TABS
10.0000 mg | ORAL_TABLET | Freq: Three times a day (TID) | ORAL | Status: DC
  Administered 2019-11-21 – 2019-11-22 (×4): 10 mg via ORAL
  Filled 2019-11-21 (×4): qty 2

## 2019-11-21 MED ORDER — GABAPENTIN 400 MG PO CAPS
400.0000 mg | ORAL_CAPSULE | Freq: Two times a day (BID) | ORAL | Status: DC
  Administered 2019-11-21 – 2019-11-22 (×2): 400 mg via ORAL
  Filled 2019-11-21 (×2): qty 1

## 2019-11-21 MED ORDER — ACETAMINOPHEN 325 MG PO TABS
650.0000 mg | ORAL_TABLET | Freq: Four times a day (QID) | ORAL | Status: DC | PRN
  Administered 2019-11-21 – 2019-11-22 (×2): 650 mg via ORAL
  Filled 2019-11-21 (×2): qty 2

## 2019-11-21 MED ORDER — OXYCODONE HCL ER 15 MG PO T12A
15.0000 mg | EXTENDED_RELEASE_TABLET | Freq: Two times a day (BID) | ORAL | Status: DC
  Filled 2019-11-21: qty 1

## 2019-11-21 MED ORDER — GABAPENTIN 600 MG PO TABS
600.0000 mg | ORAL_TABLET | Freq: Three times a day (TID) | ORAL | Status: DC
  Filled 2019-11-21: qty 1

## 2019-11-21 MED ORDER — CHLORHEXIDINE GLUCONATE CLOTH 2 % EX PADS
6.0000 | MEDICATED_PAD | Freq: Every day | CUTANEOUS | Status: DC
  Administered 2019-11-22: 6 via TOPICAL

## 2019-11-21 MED ORDER — ENOXAPARIN SODIUM 40 MG/0.4ML ~~LOC~~ SOLN
40.0000 mg | SUBCUTANEOUS | Status: DC
  Administered 2019-11-21 – 2019-11-22 (×2): 40 mg via SUBCUTANEOUS
  Filled 2019-11-21 (×2): qty 0.4

## 2019-11-21 MED ORDER — METHOCARBAMOL 500 MG PO TABS
500.0000 mg | ORAL_TABLET | Freq: Four times a day (QID) | ORAL | Status: DC
  Administered 2019-11-21 – 2019-11-22 (×5): 500 mg via ORAL
  Filled 2019-11-21 (×5): qty 1

## 2019-11-21 MED ORDER — POTASSIUM CHLORIDE IN NACL 20-0.9 MEQ/L-% IV SOLN
INTRAVENOUS | Status: DC
  Administered 2019-11-21: 22:00:00 via INTRAVENOUS
  Filled 2019-11-21: qty 1000

## 2019-11-21 MED ORDER — DOCUSATE SODIUM 100 MG PO CAPS
100.0000 mg | ORAL_CAPSULE | Freq: Two times a day (BID) | ORAL | Status: DC
  Administered 2019-11-21 – 2019-11-22 (×3): 100 mg via ORAL
  Filled 2019-11-21 (×4): qty 1

## 2019-11-21 MED ORDER — LORAZEPAM 2 MG/ML IJ SOLN
1.0000 mg | Freq: Once | INTRAMUSCULAR | Status: AC
  Administered 2019-11-21: 1 mg via INTRAVENOUS
  Filled 2019-11-21: qty 1

## 2019-11-21 MED ORDER — OXYCODONE HCL 5 MG PO TABS
5.0000 mg | ORAL_TABLET | ORAL | Status: DC | PRN
  Administered 2019-11-21 – 2019-11-22 (×2): 5 mg via ORAL
  Filled 2019-11-21 (×3): qty 1

## 2019-11-21 MED ORDER — HYDROMORPHONE HCL 1 MG/ML IJ SOLN
1.0000 mg | Freq: Once | INTRAMUSCULAR | Status: AC
  Administered 2019-11-21: 1 mg via INTRAVENOUS
  Filled 2019-11-21: qty 1

## 2019-11-21 MED ORDER — POLYETHYLENE GLYCOL 3350 17 G PO PACK
17.0000 g | PACK | Freq: Two times a day (BID) | ORAL | Status: DC
  Administered 2019-11-21 – 2019-11-22 (×2): 17 g via ORAL
  Filled 2019-11-21 (×3): qty 1

## 2019-11-21 MED ORDER — FERROUS SULFATE 325 (65 FE) MG PO TABS
325.0000 mg | ORAL_TABLET | Freq: Every day | ORAL | Status: DC
  Administered 2019-11-22: 325 mg via ORAL
  Filled 2019-11-21 (×2): qty 1

## 2019-11-21 MED ORDER — HYDROCORTISONE (PERIANAL) 2.5 % EX CREA
TOPICAL_CREAM | Freq: Three times a day (TID) | CUTANEOUS | Status: DC
  Administered 2019-11-21 – 2019-11-22 (×2): via RECTAL
  Filled 2019-11-21 (×2): qty 28.35

## 2019-11-21 NOTE — ED Notes (Signed)
Patient resting quietly with eyes closed; rise and fall of chest observed; NAD noted. 

## 2019-11-21 NOTE — Progress Notes (Signed)
RN attempted to get report x1.

## 2019-11-21 NOTE — ED Notes (Signed)
Pt family at bedside and given popsicle

## 2019-11-21 NOTE — ED Notes (Signed)
Beef broth provided to pt

## 2019-11-21 NOTE — ED Notes (Signed)
Pt's O2 sats decreased to 79% on RA after dilaudid. Woke pt up and O2 sats increased to 88%. Pt placed on 2l via n/c and O2 sats increased back up to 97-99%. Wife at bedside. Dr. Ralene Bathe aware and went in to see patient. Pt states he feels much better after medications. No pain noted at present.

## 2019-11-21 NOTE — Telephone Encounter (Signed)
Patients wife [name] called wants ML to return call he is in emergency and being admitted

## 2019-11-21 NOTE — H&P (Signed)
History and Physical  Louis Jordan ZOX:[id number] DOB: [birth year] DOA: 11/20/2019  Referring physician: ER provider PCP: Derrill Center., MD  Outpatient Specialists:    Patient coming from: [facility]: [facility] emergency room  Chief Complaint: Nausea and abdominal distention  HPI:  Patient is a 63 year old Caucasian male with past medical history significant for mitral valve prolapse, right bundle branch block and left knee injury.  Apparently, patient accidentally fell from the ladder at his house with multiple injuries associated with the fall.  Patient recently underwent back surgery and has been on opiates.  She was recently discharged back on from rehab facility.  Patient developed nausea yesterday with significant burping.  There is associated abdominal distention.  Based on above, patient presented to the emergency room at [facility].  CT scan of the abdomen done revealed constipation and ileus.  Cystitis was also queried.  Patient has had 2 enemas, and is on multiple laxatives.  Patient has been transferred to this hospital for further assessment and management.  No headache, no neck pain, no chest pain, no shortness of breath, no URI symptoms and no urinary symptoms.  No GI symptoms reported.  Hospitalist team has been called to admit patient for further assessment and management.  ED Course: Patient has undergone CT scan of the abdomen and pelvis.  Patient has been on laxatives.  CBC done on presentation to the ER revealed WBC of 14.4.  Pertinent labs: As documented above.  EKG: Independently reviewed.   Imaging: independently reviewed.   Review of Systems:  Negative for fever, visual changes, sore throat, rash, new muscle aches, chest pain, SOB, dysuria, bleeding  Past Medical History:  Diagnosis Date  . Bundle branch block, right   . Complication of anesthesia   . Hand pain    mild OA bilateral  hands  . Left knee injury 1970   requiring surgery for repair of  ligaments/cartilage damage with nerve injury that took 20 years to resolve  . MVP (mitral valve prolapse)   . PONV (postoperative nausea and vomiting)     Past Surgical History:  Procedure Laterality Date  . APPENDECTOMY    . KNEE SURGERY Right   . LUMBAR SPINE SURGERY  10/16/2019   L1 burst fracture with epidural hematoma and canal stenosis  . OPEN REDUCTION INTERNAL FIXATION (ORIF) TIBIA/FIBULA FRACTURE Left 10/19/2019   Procedure: OPEN REDUCTION INTERNAL FIXATION (ORIF) TIBIA/FIBULA FRACTURE;  Surgeon: Shona Needles, MD;  Location: [place];  Service: Orthopedics;  Laterality: Left;  . SHOULDER ARTHROSCOPY WITH BANKART REPAIR Left 10/19/2019   Procedure: SHOULDER ARTHROSCOPY WITH BANKART REPAIR, EXTENSIVE DEBRIDEMENT, LOOSE BODY REMOVAL;  Surgeon: Hiram Gash, MD;  Location: [place];  Service: Orthopedics;  Laterality: Left;  . STRABISMUS SURGERY    . TONSILLECTOMY       reports that he has never smoked. He has never used smokeless tobacco. He reports current alcohol use. He reports that he does not use drugs.  Allergies  Allergen Reactions  . Penicillins Swelling    Did it involve swelling of the face/tongue/throat, SOB, or low BP? Y Did it involve sudden or severe rash/hives, skin peeling, or any reaction on the inside of your mouth or nose? Y Did you need to seek medical attention at a hospital or doctor's office? N When did it last happen?Decades ago. If all above answers are "NO", may proceed with cephalosporin use.     Family History  Problem Relation Age of Onset  . Heart disease  Mother   . Alzheimer's disease Mother   . Pancreatic cancer Father   . High blood pressure Brother      Prior to Admission medications   Medication Sig Start Date End Date Taking? Authorizing Provider  acetaminophen (TYLENOL) 325 MG tablet Take 2 tablets (650 mg total) by mouth every 6 (six) hours as needed. Patient taking differently: Take 650 mg by mouth every 6 (six) hours as needed  for moderate pain.  11/08/19  Yes Love, Ivan Anchors, PA-C  bisacodyl (DULCOLAX) 10 MG suppository Place 1 suppository (10 mg total) rectally daily after supper. 11/08/19  Yes Love, Ivan Anchors, PA-C  docusate sodium (COLACE) 100 MG capsule Take 1 capsule (100 mg total) by mouth 2 (two) times daily. 11/08/19  Yes Love, Ivan Anchors, PA-C  enoxaparin (LOVENOX) 40 MG/0.4ML injection Inject 0.4 mLs (40 mg total) into the skin daily. 11/08/19  Yes Love, Ivan Anchors, PA-C  ferrous sulfate 325 (65 FE) MG tablet Take 1 tablet (325 mg total) by mouth daily with breakfast. 11/08/19  Yes Love, Ivan Anchors, PA-C  gabapentin (NEURONTIN) 400 MG capsule Take 400 mg by mouth 2 (two) times daily.  11/15/19  Yes [provider]  gabapentin (NEURONTIN) 600 MG tablet Take 1 tablet (600 mg total) by mouth 3 (three) times daily. Patient taking differently: Take 600 mg by mouth at bedtime.  11/14/19  Yes Raulkar, Clide Deutscher, MD  hydrocortisone (ANUSOL-HC) 2.5 % rectal cream Place rectally 3 (three) times daily. 11/08/19  Yes Love, Ivan Anchors, PA-C  lidocaine (XYLOCAINE) 2 % jelly Place 1 application into the urethra as needed (cathing). 11/08/19  Yes Love, Ivan Anchors, PA-C  methocarbamol (ROBAXIN) 500 MG tablet Take 1 tablet (500 mg total) by mouth 4 (four) times daily. 11/08/19  Yes Love, Ivan Anchors, PA-C  Multiple Vitamins-Minerals (MULTIVITAMIN ADULT PO) Take 1 tablet by mouth daily.   Yes [provider]  oxyCODONE (ROXICODONE) 5 MG immediate release tablet Take 1 tablet (5 mg total) by mouth every 4 (four) hours as needed for severe pain. Patient taking differently: Take 5-10 mg by mouth every 4 (four) hours as needed for severe pain.  11/14/19  Yes Raulkar, Clide Deutscher, MD  oxyCODONE ER (XTAMPZA ER) 9 MG C12A Take 9 mg by mouth 2 (two) times daily. 11/16/19  Yes Lovorn, Jinny Blossom, MD  polyethylene glycol (MIRALAX / GLYCOLAX) 17 g packet Take 17 g by mouth 2 (two) times daily. 11/08/19  Yes Love, Ivan Anchors, PA-C  senna (SENOKOT)  8.6 MG TABS tablet Take 1 tablet (8.6 mg total) by mouth 2 (two) times daily. 11/08/19  Yes Love, Ivan Anchors, PA-C  tamsulosin (FLOMAX) 0.4 MG CAPS capsule Take 2 capsules (0.8 mg total) by mouth daily after supper. 11/08/19  Yes Love, Ivan Anchors, PA-C  traMADol (ULTRAM) 50 MG tablet Take 1 tablet (50 mg total) by mouth every 6 (six) hours. Patient taking differently: Take 50 mg by mouth every 6 (six) hours as needed for moderate pain or severe pain.  11/14/19  Yes Raulkar, Clide Deutscher, MD  traZODone (DESYREL) 50 MG tablet Take 0.5-1 tablets (25-50 mg total) by mouth at bedtime as needed for sleep. Patient taking differently: Take 50 mg by mouth at bedtime.  11/08/19  Yes Bary Leriche, PA-C    Physical Exam: Vitals:   11/21/19 1400 11/21/19 1430 11/21/19 1600 11/21/19 1630  BP: 136/88 (!) 150/84 (!) 143/83 (!) 145/84  Pulse: 62 62 64 67  Resp:  '16 11 18  '$ Temp:  TempSrc:      SpO2: 96% 99% 98% 100%  Weight:      Height:         Constitutional:  . Appears calm and comfortable.  Left upper extremity is on a sling. Eyes:  . No pallor. No jaundice.  ENMT:  . external ears, nose appear normal Neck:  . Neck is supple. No JVD Respiratory:  . CTA bilaterally, no w/r/r.  . Respiratory effort normal. No retractions or accessory muscle use Cardiovascular:  . S1S2 . No LE extremity edema   Abdomen:  . Abdomen is soft and non tender. Organs are difficult to assess. Neurologic:  . Awake and alert. . Moves all limbs.  Wt Readings from Last 3 Encounters:  11/20/19 68 kg  11/08/19 58.3 kg  10/19/19 70.3 kg    I have personally reviewed following labs and imaging studies  Labs on Admission:  CBC: Recent Labs  Lab 11/20/19 1908  WBC 14.4*  NEUTROABS 13.5*  HGB 13.4  HCT 41.2  MCV 99.8  PLT 314   Basic Metabolic Panel: Recent Labs  Lab 11/20/19 1908  NA 136  K 3.8  CL 100  CO2 29  GLUCOSE 126*  BUN 19  CREATININE 0.80  CALCIUM 8.7*   Liver Function Tests:  Recent Labs  Lab 11/20/19 1908  AST 16  ALT 27  ALKPHOS 86  BILITOT 0.6  PROT 5.8*  ALBUMIN 3.2*   No results for input(s): LIPASE, AMYLASE in the last 168 hours. No results for input(s): AMMONIA in the last 168 hours. Coagulation Profile: No results for input(s): INR, PROTIME in the last 168 hours. Cardiac Enzymes: No results for input(s): CKTOTAL, CKMB, CKMBINDEX, TROPONINI in the last 168 hours. BNP (last 3 results) No results for input(s): PROBNP in the last 8760 hours. HbA1C: No results for input(s): HGBA1C in the last 72 hours. CBG: Recent Labs  Lab 11/20/19 1919  GLUCAP 104*   Lipid Profile: No results for input(s): CHOL, HDL, LDLCALC, TRIG, CHOLHDL, LDLDIRECT in the last 72 hours. Thyroid Function Tests: No results for input(s): TSH, T4TOTAL, FREET4, T3FREE, THYROIDAB in the last 72 hours. Anemia Panel: No results for input(s): VITAMINB12, FOLATE, FERRITIN, TIBC, IRON, RETICCTPCT in the last 72 hours. Urine analysis:    Component Value Date/Time   COLORURINE YELLOW 11/20/2019 1919   APPEARANCEUR CLOUDY (A) 11/20/2019 1919   LABSPEC 1.020 11/20/2019 1919   PHURINE 7.5 11/20/2019 1919   GLUCOSEU NEGATIVE 11/20/2019 1919   HGBUR MODERATE (A) 11/20/2019 [place] NEGATIVE 11/20/2019 [place] NEGATIVE 11/20/2019 1919   PROTEINUR NEGATIVE 11/20/2019 1919   NITRITE NEGATIVE 11/20/2019 1919   LEUKOCYTESUR MODERATE (A) 11/20/2019 1919   Sepsis Labs: '@LABRCNTIP'$ (procalcitonin:4,lacticidven:4) ) Recent Results (from the past 240 hour(s))  SARS Coronavirus 2 Ag (30 min TAT) - Nasal Swab (BD Veritor Kit)     Status: None   Collection Time: 11/21/19 12:50 AM   Specimen: Nasal Swab (BD Veritor Kit)  Result Value Ref Range Status   SARS Coronavirus 2 Ag NEGATIVE NEGATIVE Final    Comment: (NOTE) SARS-CoV-2 antigen NOT DETECTED.  Negative results are presumptive.  Negative results do not preclude SARS-CoV-2 infection and should not be used as the  sole basis for treatment or other patient management decisions, including infection  control decisions, particularly in the presence of clinical signs and  symptoms consistent with COVID-19, or in those who have been in contact with the virus.  Negative results must be combined with clinical  observations, patient history, and epidemiological information. The expected result is Negative. Fact Sheet for Patients: PodPark.tn Fact Sheet for Healthcare Providers: GiftContent.is This test is not yet approved or cleared by the [place] FDA and  has been authorized for detection and/or diagnosis of SARS-CoV-2 by FDA under an Emergency Use Authorization (EUA).  This EUA will remain in effect (meaning this test can be used) for the duration of  the COVID-19 de claration under Section 564(b)(1) of the Act, 21 U.S.C. section 360bbb-3(b)(1), unless the authorization is terminated or revoked sooner. Performed at [facility], [address] [place], [place] [address]   SARS CORONAVIRUS 2 (TAT 6-24 HRS) Nasopharyngeal Nasopharyngeal Swab     Status: None   Collection Time: 11/21/19  4:30 AM   Specimen: Nasopharyngeal Swab  Result Value Ref Range Status   SARS Coronavirus 2 NEGATIVE NEGATIVE Final    Comment: (NOTE) SARS-CoV-2 target nucleic acids are NOT DETECTED. The SARS-CoV-2 RNA is generally detectable in upper and lower respiratory specimens during the acute phase of infection. Negative results do not preclude SARS-CoV-2 infection, do not rule out co-infections with other pathogens, and should not be used as the sole basis for treatment or other patient management decisions. Negative results must be combined with clinical observations, patient history, and epidemiological information. The expected result is Negative. Fact Sheet for Patients: SugarRoll.be Fact Sheet for Healthcare  Providers: https://www.woods-mathews.com/ This test is not yet approved or cleared by the [place] FDA and  has been authorized for detection and/or diagnosis of SARS-CoV-2 by FDA under an Emergency Use Authorization (EUA). This EUA will remain  in effect (meaning this test can be used) for the duration of the COVID-19 declaration under Section 56 4(b)(1) of the Act, 21 U.S.C. section 360bbb-3(b)(1), unless the authorization is terminated or revoked sooner. Performed at [facility], [place] [address], [place], [place] [address]       Radiological Exams on Admission: Ct Head Wo Contrast  Result Date: 11/20/2019 CLINICAL DATA:  Headache vomiting. EXAM: CT HEAD WITHOUT CONTRAST TECHNIQUE: Contiguous axial images were obtained from the base of the skull through the vertex without intravenous contrast. COMPARISON:  None FINDINGS: Brain: No evidence of acute infarction, hemorrhage, hydrocephalus, extra-axial collection or mass lesion/mass effect. Vascular: No hyperdense vessel or unexpected calcification. Skull: Normal. Negative for fracture or focal lesion. Sinuses/Orbits: No acute finding. Other: None. IMPRESSION: No acute intracranial abnormality Electronically Signed   By: Zetta Bills M.D.   On: 11/20/2019 21:43   Ct Abdomen Pelvis W Contrast  Result Date: 11/20/2019 CLINICAL DATA:  Vomiting. EXAM: CT ABDOMEN AND PELVIS WITH CONTRAST TECHNIQUE: Multidetector CT imaging of the abdomen and pelvis was performed using the standard protocol following bolus administration of intravenous contrast. CONTRAST:  165m OMNIPAQUE IOHEXOL 300 MG/ML  SOLN COMPARISON:  None FINDINGS: Lower chest: Lung bases with basilar atelectasis. Hepatobiliary: No sign of focal hepatic lesion. Mild distension of the gallbladder without pericholecystic stranding. No signs of biliary ductal dilation. Pancreas: Unremarkable. No pancreatic ductal dilatation or surrounding inflammatory changes. Spleen:  Normal in size without focal abnormality. Adrenals/Urinary Tract: Normal appearance of bilateral adrenal glands. Symmetric enhancement of bilateral kidneys. Stomach/Bowel: No sign of acute gastrointestinal process. Stool fills much of the colon. There are also mildly distended loops of small bowel which are filled with stool like material. No pericolonic or perienteric stranding. Vascular/Lymphatic: No signs of acute vascular process with atherosclerotic calcification and noncalcified plaque in the abdominal aorta extending in the  iliac vessels. Reproductive: Prostate with heterogeneity. Other: No signs of free air. Foley catheter in the urinary bladder. Mild perivesical stranding and mural stratification of the bladder wall. Musculoskeletal: Signs of posterior fusion and L1 burst fracture. Fusion spans the T10 through L4 levels with laminectomy changes at the L1 level also with morselized bone grafting along the posterior elements. IMPRESSION: 1. Findings of constipation. Potential for concomitant ileus given some mildly distended stool-filled loops of small bowel. 2. Under distended urinary bladder shows some perivesical stranding, correlate with any clinical evidence of cystitis. 3. Aortic atherosclerosis. 4. Signs of recent postoperative changes in the spine spanning an L1 burst fracture. No acute bone process. Aortic Atherosclerosis (ICD10-I70.0). Electronically Signed   By: Zetta Bills M.D.   On: 11/20/2019 21:37    EKG: Independently reviewed.   Active Problems:   Nausea & vomiting   Ileus (HCC)   Assessment/Plan Ileus: -I think ileus has resolved significantly. -Get abdominal KUB -Continue current management  Nausea and vomiting: -Manage expectantly.  Diarrhea: Resolved. Follow any pending work-up.  History of recent trauma:  -We will defer to the primary team.  DVT prophylaxis: Anticoagulation Code Status: Full Family Communication:  Disposition Plan: Home eventually  Consults called: None.  Will have low threshold to consult the surgical team\\\\\ Admission status: Observation  Time spent: 60 minutes  Dana Allan, MD  [facility] Pager #: [phone] 7PM-7AM contact night coverage as above  11/21/2019, 8:13 PM
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
## 2019-11-21 NOTE — ED Notes (Signed)
Pt has ready bed at [facility]; bed assignment is telemetry; pt and wife wish to go to [facility] POV if possible; pt stable- VSS, tolerating po liquids; EDP ok's pt to be transported by POV. Education provided to pt and wife re: no stops en route and IV is not to be used for anything. Pt and wife verbalize understanding.

## 2019-11-21 NOTE — ED Notes (Signed)
Chicken broth and Diet Coke provided to pt

## 2019-11-21 NOTE — ED Notes (Signed)
ED Provider at bedside. 

## 2019-11-21 NOTE — ED Provider Notes (Signed)
6:36 AM   Patient requesting coffee.  He has rested comfortably after medications overnight.  Clinically is being admitted for an ileus.  Feel it is reasonable given that he is feeling better to start him on a clear liquid diet.  This was ordered.   Merryl Hacker, MD 11/21/19 [id number]

## 2019-11-22 DIAGNOSIS — K567 Ileus, unspecified: Secondary | ICD-10-CM | POA: Diagnosis not present

## 2019-11-22 LAB — URINALYSIS, COMPLETE (UACMP) WITH MICROSCOPIC
Bilirubin Urine: NEGATIVE
Glucose, UA: NEGATIVE mg/dL
Ketones, ur: NEGATIVE mg/dL
Nitrite: NEGATIVE
Protein, ur: 30 mg/dL — AB
RBC / HPF: >50 RBC/hpf — ABNORMAL HIGH (ref 0–5)
Specific Gravity, Urine: 1.016 (ref 1.005–1.030)
WBC, UA: >50 WBC/hpf — ABNORMAL HIGH (ref 0–5)
pH: 6 (ref 5.0–8.0)

## 2019-11-22 LAB — CBC
HCT: 38.2 % — ABNORMAL LOW (ref 39.0–52.0)
Hemoglobin: 12.4 g/dL — ABNORMAL LOW (ref 13.0–17.0)
MCH: 33.1 pg (ref 26.0–34.0)
MCHC: 32.5 g/dL (ref 30.0–36.0)
MCV: 101.9 fL — ABNORMAL HIGH (ref 80.0–100.0)
Platelets: 324 10*3/uL (ref 150–400)
RBC: 3.75 MIL/uL — ABNORMAL LOW (ref 4.22–5.81)
RDW: 14.5 % (ref 11.5–15.5)
WBC: 6.8 10*3/uL (ref 4.0–10.5)
nRBC: 0 % (ref 0.0–0.2)

## 2019-11-22 LAB — BASIC METABOLIC PANEL
Anion gap: 8 (ref 5–15)
BUN: 17 mg/dL (ref 8–23)
CO2: 29 mmol/L (ref 22–32)
Calcium: 8.6 mg/dL — ABNORMAL LOW (ref 8.9–10.3)
Chloride: 100 mmol/L (ref 98–111)
Creatinine, Ser: 0.56 mg/dL — ABNORMAL LOW (ref 0.61–1.24)
GFR calc Af Amer: >60 mL/min (ref >60)
GFR calc non Af Amer: >60 mL/min (ref >60)
Glucose, Bld: 85 mg/dL (ref 70–99)
Potassium: 3.8 mmol/L (ref 3.5–5.1)
Sodium: 137 mmol/L (ref 135–145)

## 2019-11-22 MED ORDER — NYSTATIN 100000 UNIT/ML MT SUSP: 5.0000 mL | Freq: Four times a day (QID) | OROMUCOSAL | 0 refills | Status: AC

## 2019-11-22 MED ORDER — SODIUM CHLORIDE 0.9 % IV SOLN
2.0000 g | Freq: Two times a day (BID) | INTRAVENOUS | Status: DC
  Administered 2019-11-22: 2 g via INTRAVENOUS
  Filled 2019-11-22: qty 2

## 2019-11-22 MED ORDER — ONDANSETRON HCL 4 MG PO TABS: 4.0000 mg | ORAL_TABLET | Freq: Every day | ORAL | 0 refills | Status: DC | PRN

## 2019-11-22 MED ORDER — NYSTATIN 100000 UNIT/ML MT SUSP
5.0000 mL | Freq: Four times a day (QID) | OROMUCOSAL | Status: DC
  Administered 2019-11-22 (×2): 500000 [IU] via ORAL
  Filled 2019-11-22 (×2): qty 5

## 2019-11-22 NOTE — Discharge Summary (Signed)
Physician Discharge Summary  Louis Jordan JHE:[id number] DOB: [birth year] DOA: 11/20/2019  PCP: Derrill Center., MD  Admit date: 11/20/2019 Discharge date: 11/22/2019  Admitted From: Home Disposition:  Home  Discharge Condition:Stable CODE STATUS:FULL Diet recommendation:  Regular  Brief/Interim Summary:  HPI (Dr Marthenia Rolling):  Patient is a 63 year old Caucasian male with past medical history significant for mitral valve prolapse, right bundle branch block and left knee injury.  Apparently, patient accidentally fell from the ladder at his house with multiple injuries associated with the fall.  Patient recently underwent back surgery and has been on opiates.  She was recently discharged back on from rehab facility.  Patient developed nausea yesterday with significant burping.  There is associated abdominal distention.  Based on above, patient presented to the emergency room at [facility].  CT scan of the abdomen done revealed constipation and ileus.  Cystitis was also queried.  Patient has had 2 enemas, and is on multiple laxatives.  Patient has been transferred to this hospital for further assessment and management.  No headache, no neck pain, no chest pain, no shortness of breath, no URI symptoms and no urinary symptoms.  No GI symptoms reported.  Hospitalist team has been called to admit patient for further assessment and management.  Hospital Course:  Patient initially presented to the emergency department with complaints of abdominal discomfort, constipation.  He had bowel movements after he was admitted.  X-ray of the abdomen does not show any ileus.  He has good bowel sounds.  Denies any abdomen pain, nausea or vomiting at present.  He was discharged from inpatient rehab on 11/07/2019.  He was initially admitted to [facility] hospital on 10/15/2019 after a fall from the ladder with subsequent L1 burst fracture with retropulsion, left anterior shoulder dislocation, left trimalleolar ankle fracture.   He underwent extensive orthopedic intervention.  He is nonweightbearing on the left lower extremity.  He also developed urinary retention and Foley had to be placed.  His urine culture while  he was on inpatient rehab  showed Pseudomonas aeruginosa and was treated with 7 days course of cefepime. This morning he was hemodynamically stable.  Denies any problems and eager to go home.  His urine culture has again showed Pseudomonas.  He does not have any signs or symptoms of urinary tract infection.  He denies any dysuria.  His white cell counts were normal..  This is most likely secondary to colonization. No need to treat with antibiotics.  He needs to follow-up with his orthopedics and urology appointments.  He needs  to continue aggressive bowel regimen at home.  Continue Foley care.  Continue to wear sling on the left upper extremity.  Patient is hemodynamically stable for discharge to home today.   Discharge Diagnoses:  Active Problems:   Nausea & vomiting   Ileus [facility])    Discharge Instructions  Discharge Instructions    Diet general   Complete by: As directed    Discharge instructions   Complete by: As directed    1)Please take prescribed medication as instructed. 2)Follow up with Orthopedics ,Urology on the given appointment dates.   Increase activity slowly   Complete by: As directed      Allergies as of 11/22/2019      Reactions   Penicillins Swelling   Did it involve swelling of the face/tongue/throat, SOB, or low BP? Y Did it involve sudden or severe rash/hives, skin peeling, or any reaction on the inside of your mouth or nose? Y Did  you need to seek medical attention at a hospital or doctor's office? N When did it last happen?Decades ago. If all above answers are "NO", may proceed with cephalosporin use.      Medication List    TAKE these medications   acetaminophen 325 MG tablet Commonly known as: TYLENOL Take 2 tablets (650 mg total) by mouth every 6 (six) hours as  needed. What changed: reasons to take this   bisacodyl 10 MG suppository Commonly known as: DULCOLAX Place 1 suppository (10 mg total) rectally daily after supper.   docusate sodium 100 MG capsule Commonly known as: COLACE Take 1 capsule (100 mg total) by mouth 2 (two) times daily.   enoxaparin 40 MG/0.4ML injection Commonly known as: LOVENOX Inject 0.4 mLs (40 mg total) into the skin daily.   ferrous sulfate 325 (65 FE) MG tablet Take 1 tablet (325 mg total) by mouth daily with breakfast.   gabapentin 600 MG tablet Commonly known as: Neurontin Take 1 tablet (600 mg total) by mouth 3 (three) times daily. What changed: when to take this   gabapentin 400 MG capsule Commonly known as: NEURONTIN Take 400 mg by mouth 2 (two) times daily. What changed: Another medication with the same name was changed. Make sure you understand how and when to take each.   hydrocortisone 2.5 % rectal cream Commonly known as: ANUSOL-HC Place rectally 3 (three) times daily.   lidocaine 2 % jelly Commonly known as: XYLOCAINE Place 1 application into the urethra as needed (cathing).   methocarbamol 500 MG tablet Commonly known as: ROBAXIN Take 1 tablet (500 mg total) by mouth 4 (four) times daily.   MULTIVITAMIN ADULT PO Take 1 tablet by mouth daily.   nystatin 100000 UNIT/ML suspension Commonly known as: MYCOSTATIN Take 5 mLs (500,000 Units total) by mouth 4 (four) times daily for 7 days.   oxyCODONE 5 MG immediate release tablet Commonly known as: Roxicodone Take 1 tablet (5 mg total) by mouth every 4 (four) hours as needed for severe pain. What changed: how much to take   polyethylene glycol 17 g packet Commonly known as: MIRALAX / GLYCOLAX Take 17 g by mouth 2 (two) times daily.   senna 8.6 MG Tabs tablet Commonly known as: SENOKOT Take 1 tablet (8.6 mg total) by mouth 2 (two) times daily.   tamsulosin 0.4 MG Caps capsule Commonly known as: FLOMAX Take 2 capsules (0.8 mg total)  by mouth daily after supper.   traMADol 50 MG tablet Commonly known as: ULTRAM Take 1 tablet (50 mg total) by mouth every 6 (six) hours. What changed:   when to take this  reasons to take this   traZODone 50 MG tablet Commonly known as: DESYREL Take 0.5-1 tablets (25-50 mg total) by mouth at bedtime as needed for sleep. What changed:   how much to take  when to take this   Xtampza ER 9 MG C12a Generic drug: oxyCODONE ER Take 9 mg by mouth 2 (two) times daily.      Follow-up Information    Weaver, John W., MD. Schedule an appointment as soon as possible for a visit in 1 week(s).   Specialty: Family Medicine Contact information: [address] [place] [address] [phone]          Allergies  Allergen Reactions  . Penicillins Swelling    Did it involve swelling of the face/tongue/throat, SOB, or low BP? Y Did it involve sudden or severe rash/hives, skin peeling, or any reaction on   the inside of your mouth or nose? Y Did you need to seek medical attention at a hospital or doctor's office? N When did it last happen?Decades ago. If all above answers are "NO", may proceed with cephalosporin use.     Consultations:  None   Procedures/Studies: Left Ankle  Result Date: 11/07/2019 CLINICAL DATA:  Post left ankle ORIF. EXAM: LEFT ANKLE COMPLETE - 3+ VIEW COMPARISON:  Radiographs 10/19/2019. FINDINGS: The splint has been removed. The hardware is intact status post medial tibial plate and screw fixation and screw fixation of the distal fibula. The underlying fracture lines remain evident without significant displacement. 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 No new findings. IMPRESSION: Intact hardware and stable appearance status post ORIF for fractures of the distal tibia and fibula. Electronically Signed   By: William  Veazey M.D.   On: 11/07/2019 16:44   Dg Abd 1 View  Result Date: 11/21/2019 CLINICAL DATA:  Ileus EXAM: ABDOMEN - 1  VIEW COMPARISON:  CT dated November 20, 2019 FINDINGS: There is a moderate amount of stool throughout the colon. The bowel gas pattern is nonobstructive. Posterior fusion hardware is noted throughout the visualized lumbar spine. IMPRESSION: Nonobstructive bowel gas pattern. Electronically Signed   By: Christopher  Green M.D.   On: 11/21/2019 21:18   Dg Abd 1 View  Result Date: 10/25/2019 CLINICAL DATA:  Constipation for several days EXAM: ABDOMEN - 1 VIEW COMPARISON:  10/15/2019 FINDINGS: Scattered large and small bowel gas is noted. Mild retained fecal material is seen without significant constipation. Postsurgical changes are noted in the thoracolumbar spine. Compression deformity at L1 is noted similar to that seen on prior CT examination. No free air is noted. IMPRESSION: Postsurgical changes are seen. Mild retained fecal material is noted without significant constipation. Electronically Signed   By: Mark  Lukens M.D.   On: 10/25/2019 22:36   Ct Head Wo Contrast  Result Date: 11/20/2019 CLINICAL DATA:  Headache vomiting. EXAM: CT HEAD WITHOUT CONTRAST TECHNIQUE: Contiguous axial images were obtained from the base of the skull through the vertex without intravenous contrast. COMPARISON:  None FINDINGS: Brain: No evidence of acute infarction, hemorrhage, hydrocephalus, extra-axial collection or mass lesion/mass effect. Vascular: No hyperdense vessel or unexpected calcification. Skull: Normal. Negative for fracture or focal lesion. Sinuses/Orbits: No acute finding. Other: None. IMPRESSION: No acute intracranial abnormality Electronically Signed   By: Geoffrey  Wile M.D.   On: 11/20/2019 21:43   Ct Abdomen Pelvis W Contrast  Result Date: 11/20/2019 CLINICAL DATA:  Vomiting. EXAM: CT ABDOMEN AND PELVIS WITH CONTRAST TECHNIQUE: Multidetector CT imaging of the abdomen and pelvis was performed using the standard protocol following bolus administration of intravenous contrast. CONTRAST:  100mL OMNIPAQUE  IOHEXOL 300 MG/ML  SOLN COMPARISON:  None FINDINGS: Lower chest: Lung bases with basilar atelectasis. Hepatobiliary: No sign of focal hepatic lesion. Mild distension of the gallbladder without pericholecystic stranding. No signs of biliary ductal dilation. Pancreas: Unremarkable. No pancreatic ductal dilatation or surrounding inflammatory changes. Spleen: Normal in size without focal abnormality. Adrenals/Urinary Tract: Normal appearance of bilateral adrenal glands. Symmetric enhancement of bilateral kidneys. Stomach/Bowel: No sign of acute gastrointestinal process. Stool fills much of the colon. There are also mildly distended loops of small bowel which are filled with stool like material. No pericolonic or perienteric stranding. Vascular/Lymphatic: No signs of acute vascular process with atherosclerotic calcification and noncalcified plaque in the abdominal aorta extending in the iliac vessels. Reproductive: Prostate with heterogeneity. Other: No signs of free air. Foley catheter in   the urinary bladder. Mild perivesical stranding and mural stratification of the bladder wall. Musculoskeletal: Signs of posterior fusion and L1 burst fracture. Fusion spans the T10 through L4 levels with laminectomy changes at the L1 level also with morselized bone grafting along the posterior elements. IMPRESSION: 1. Findings of constipation. Potential for concomitant ileus given some mildly distended stool-filled loops of small bowel. 2. Under distended urinary bladder shows some perivesical stranding, correlate with any clinical evidence of cystitis. 3. Aortic atherosclerosis. 4. Signs of recent postoperative changes in the spine spanning an L1 burst fracture. No acute bone process. Aortic Atherosclerosis (ICD10-I70.0). Electronically Signed   By: Zetta Bills M.D.   On: 11/20/2019 21:37   Dg Shoulder Left  Result Date: 10/24/2019 CLINICAL DATA:  Postop for left shoulder arthroscopy 5 days ago. EXAM: LEFT SHOULDER - 2+ VIEW  COMPARISON:  10/17/2019 FINDINGS: Mild degenerative changes the owner surface of the acromioclavicular joint. Lower cervical spine fixation. Transverse aortic atherosclerosis. Given limitations of patient positioning and support apparatus, no fracture or dislocation. Possible osseous irregularity involving the inferior glenoid, suboptimally evaluated. IMPRESSION: No acute osseous abnormality. Electronically Signed   By: Abigail Miyamoto M.D.   On: 10/24/2019 14:19       Subjective: Patient seen and examined the bedside this morning.  Hemodynamically stable for discharge today to home.  Discharge Exam: Vitals:   11/21/19 2023 11/22/19 0550  BP: (!) 154/96 124/75  Pulse: 64 (!) 56  Resp: 16 16  Temp: 98.5 F (36.9 C) 98.4 F (36.9 C)  SpO2: 99% 99%   Vitals:   11/21/19 1600 11/21/19 1630 11/21/19 2023 11/22/19 0550  BP: (!) 143/83 (!) 145/84 (!) 154/96 124/75  Pulse: 64 67 64 (!) 56  Resp: _0 Temp:   98.5 F (36.9 C) 98.4 F (36.9 C)  TempSrc:   Oral Oral  SpO2: 98% 100% 99% 99%  Weight:      Height:        General: Pt is alert, awake, not in acute distress Cardiovascular: RRR, S1/S2 +, no rubs, no gallops Respiratory: CTA bilaterally, no wheezing, no rhonchi Abdominal: Soft, NT, ND, bowel sounds + Extremities: no edema, no cyanosis, sling on the left upper extremity, brace on the left lower extremity    The results of significant diagnostics from this hospitalization (including imaging, microbiology, ancillary and laboratory) are listed below for reference.     Microbiology: Recent Results (from the past 240 hour(s))  Urine culture     Status: Abnormal (Preliminary result)   Collection Time: 11/20/19  7:19 PM   Specimen: Urine, Random  Result Value Ref Range Status   Specimen Description   Final    URINE, RANDOM Performed at [facility], [address] [place], [place] [address]    Special Requests   Final    NONE Performed at [facility], [address] [place], [place] [address]    Culture (A)  Final    >=100,000 COLONIES/mL PSEUDOMONAS AERUGINOSA SUSCEPTIBILITIES TO FOLLOW Performed at [facility], [place] [address] [place], [place] [address]    Report Status PENDING  Incomplete  SARS Coronavirus 2 Ag (30 min TAT) - Nasal Swab (BD Veritor Kit)     Status: None   Collection Time: 11/21/19 12:50 AM   Specimen: Nasal Swab (BD Veritor Kit)  Result Value Ref Range Status   SARS Coronavirus 2 Ag NEGATIVE NEGATIVE Final    Comment: (NOTE) SARS-CoV-2 antigen  NOT DETECTED.  Negative results are presumptive.  Negative results do not preclude SARS-CoV-2 infection and should not be used as the sole basis for treatment or other patient management decisions, including infection  control decisions, particularly in the presence of clinical signs and  symptoms consistent with COVID-19, or in those who have been in contact with the virus.  Negative results must be combined with clinical observations, patient history, and epidemiological information. The expected result is Negative. Fact Sheet for Patients: https://www.fda.gov/media/139754/download Fact Sheet for Healthcare Providers: https://www.fda.gov/media/139753/download This test is not yet approved or cleared by the [place] FDA and  has been authorized for detection and/or diagnosis of SARS-CoV-2 by FDA under an Emergency Use Authorization (EUA).  This EUA will remain in effect (meaning this test can be used) for the duration of  the COVID-19 de claration under Section 564(b)(1) of the Act, 21 U.S.C. section 360bbb-3(b)(1), unless the authorization is terminated or revoked sooner. Performed at [facility] [place], [address] [place], [place] [address]   SARS CORONAVIRUS 2 (TAT 6-24 HRS) Nasopharyngeal Nasopharyngeal Swab     Status: None   Collection Time: 11/21/19  4:30 AM   Specimen: Nasopharyngeal Swab  Result Value Ref Range Status    SARS Coronavirus 2 NEGATIVE NEGATIVE Final    Comment: (NOTE) SARS-CoV-2 target nucleic acids are NOT DETECTED. The SARS-CoV-2 RNA is generally detectable in upper and lower respiratory specimens during the acute phase of infection. Negative results do not preclude SARS-CoV-2 infection, do not rule out co-infections with other pathogens, and should not be used as the sole basis for treatment or other patient management decisions. Negative results must be combined with clinical observations, patient history, and epidemiological information. The expected result is Negative. Fact Sheet for Patients: https://www.fda.gov/media/138098/download Fact Sheet for Healthcare Providers: https://www.fda.gov/media/138095/download This test is not yet approved or cleared by the [place] FDA and  has been authorized for detection and/or diagnosis of SARS-CoV-2 by FDA under an Emergency Use Authorization (EUA). This EUA will remain  in effect (meaning this test can be used) for the duration of the COVID-19 declaration under Section 56 4(b)(1) of the Act, 21 U.S.C. section 360bbb-3(b)(1), unless the authorization is terminated or revoked sooner. Performed at [facility], [address] [place], [place] [address]      Labs: BNP (last 3 results) No results for input(s): BNP in the last 8760 hours. Basic Metabolic Panel: Recent Labs  Lab 11/20/19 1908 11/21/19 2036 11/22/19 0613  NA 136 137 137  K 3.8 4.1 3.8  CL 100 98 100  CO2 29 28 29  GLUCOSE 126* 99 85  BUN 19 13 17  CREATININE 0.80 0.65 0.56*  CALCIUM 8.7* 9.0 8.6*  MG  --  2.1  --   PHOS  --  3.6  --    Liver Function Tests: Recent Labs  Lab 11/20/19 1908 11/21/19 2036  AST 16 13*  ALT 27 24  ALKPHOS 86 84  BILITOT 0.6 0.5  PROT 5.8* 6.2*  ALBUMIN 3.2* 3.1*   No results for input(s): LIPASE, AMYLASE in the last 168 hours. No results for input(s): AMMONIA in the last 168 hours. CBC: Recent Labs  Lab  11/20/19 1908 11/21/19 2036 11/22/19 0613  WBC 14.4* 10.7* 6.8  NEUTROABS 13.5* 9.2*  --   HGB 13.4 13.7 12.4*  HCT 41.2 42.7 38.2*  MCV 99.8 102.4* 101.9*  PLT 335 331 324   Cardiac Enzymes: No results for input(s): CKTOTAL, CKMB,   CKMBINDEX, TROPONINI in the last 168 hours. BNP: Invalid input(s): POCBNP CBG: Recent Labs  Lab 11/20/19 1919  GLUCAP 104*   D-Dimer No results for input(s): DDIMER in the last 72 hours. Hgb A1c No results for input(s): HGBA1C in the last 72 hours. Lipid Profile No results for input(s): CHOL, HDL, LDLCALC, TRIG, CHOLHDL, LDLDIRECT in the last 72 hours. Thyroid function studies Recent Labs    11/21/19 2036  TSH 1.547   Anemia work up No results for input(s): VITAMINB12, FOLATE, FERRITIN, TIBC, IRON, RETICCTPCT in the last 72 hours. Urinalysis    Component Value Date/Time   COLORURINE YELLOW 11/22/2019 0609   APPEARANCEUR TURBID (A) 11/22/2019 0609   LABSPEC 1.016 11/22/2019 0609   PHURINE 6.0 11/22/2019 0609   GLUCOSEU NEGATIVE 11/22/2019 0609   HGBUR LARGE (A) 11/22/2019 0609   BILIRUBINUR NEGATIVE 11/22/2019 0609   KETONESUR NEGATIVE 11/22/2019 0609   PROTEINUR 30 (A) 11/22/2019 0609   NITRITE NEGATIVE 11/22/2019 0609   LEUKOCYTESUR LARGE (A) 11/22/2019 0609   Sepsis Labs Invalid input(s): PROCALCITONIN,  WBC,  LACTICIDVEN Microbiology Recent Results (from the past 240 hour(s))  Urine culture     Status: Abnormal (Preliminary result)   Collection Time: 11/20/19  7:19 PM   Specimen: Urine, Random  Result Value Ref Range Status   Specimen Description   Final    URINE, RANDOM Performed at [facility], [address] [place], [place] [address]    Special Requests   Final    NONE Performed at [facility], [address] [place], [place] [address]    Culture (A)  Final    >=100,000 COLONIES/mL PSEUDOMONAS AERUGINOSA SUSCEPTIBILITIES TO FOLLOW Performed at [facility], [place] [address],  [place], [place] [address]    Report Status PENDING  Incomplete  SARS Coronavirus 2 Ag (30 min TAT) - Nasal Swab (BD Veritor Kit)     Status: None   Collection Time: 11/21/19 12:50 AM   Specimen: Nasal Swab (BD Veritor Kit)  Result Value Ref Range Status   SARS Coronavirus 2 Ag NEGATIVE NEGATIVE Final    Comment: (NOTE) SARS-CoV-2 antigen NOT DETECTED.  Negative results are presumptive.  Negative results do not preclude SARS-CoV-2 infection and should not be used as the sole basis for treatment or other patient management decisions, including infection  control decisions, particularly in the presence of clinical signs and  symptoms consistent with COVID-19, or in those who have been in contact with the virus.  Negative results must be combined with clinical observations, patient history, and epidemiological information. The expected result is Negative. Fact Sheet for Patients: PodPark.tn Fact Sheet for Healthcare Providers: GiftContent.is This test is not yet approved or cleared by the [place] FDA and  has been authorized for detection and/or diagnosis of SARS-CoV-2 by FDA under an Emergency Use Authorization (EUA).  This EUA will remain in effect (meaning this test can be used) for the duration of  the COVID-19 de claration under Section 564(b)(1) of the Act, 21 U.S.C. section 360bbb-3(b)(1), unless the authorization is terminated or revoked sooner. Performed at [facility], [address] [place], [place] [address]   SARS CORONAVIRUS 2 (TAT 6-24 HRS) Nasopharyngeal Nasopharyngeal Swab     Status: None   Collection Time: 11/21/19  4:30 AM   Specimen: Nasopharyngeal Swab  Result Value Ref Range Status   SARS Coronavirus 2 NEGATIVE NEGATIVE Final    Comment: (NOTE) SARS-CoV-2 target nucleic acids are  NOT DETECTED. The SARS-CoV-2 RNA is generally detectable in upper and lower respiratory specimens during the  acute phase of infection. Negative results do not preclude SARS-CoV-2 infection, do not rule out co-infections with other pathogens, and should not be used as the sole basis for treatment or other patient management decisions. Negative results must be combined with clinical observations, patient history, and epidemiological information. The expected result is Negative. Fact Sheet for Patients: https://www.fda.gov/media/138098/download Fact Sheet for Healthcare Providers: https://www.fda.gov/media/138095/download This test is not yet approved or cleared by the [place] FDA and  has been authorized for detection and/or diagnosis of SARS-CoV-2 by FDA under an Emergency Use Authorization (EUA). This EUA will remain  in effect (meaning this test can be used) for the duration of the COVID-19 declaration under Section 56 4(b)(1) of the Act, 21 U.S.C. section 360bbb-3(b)(1), unless the authorization is terminated or revoked sooner. Performed at [facility], [address] [place], [place] [address]     Please note: You were cared for by a hospitalist during your hospital stay. Once you are discharged, your primary care physician will handle any further medical issues. Please note that NO REFILLS for any discharge medications will be authorized once you are discharged, as it is imperative that you return to your primary care physician (or establish a relationship with a primary care physician if you do not have one) for your post hospital discharge needs so that they can reassess your need for medications and monitor your lab values.    Time coordinating discharge: 40 minutes  SIGNED:   Amrit Adhikari, MD  [facility] 11/22/2019, 1:07 PM Pager [phone]  If 7PM-7AM, please contact night-coverage [email or web address] Password [email or web address] 

## 2019-11-22 NOTE — Progress Notes (Signed)
Pharmacy Antibiotic Note  Louis Jordan is a 63 y.o. male with recent hx PsA UTI in November presented to [facility] on 11/20/2019 with c/o n/v.  Abdominal CT showed findings with suspicion for ileus and cystitis.  UCX on 12/6 came back on 12/8 with PsA.  Pharmacy has been consulted to dose cefepime for UTI.  - Of note, patient has PCN allergy but tolerated as cefepime in the past.  11/11 ucx: >100K PsA (pans sens) FINAL  12/6 UCx: >100: PsA 12/8 UA: many bact, >50 wbc, large leuk   Plan: - cefepime 2gm IV q12h - f/u sucept. for PsA _______________________________________________  Height: 6' (182.9 cm) Weight: 150 lb (68 kg) IBW/kg (Calculated) : 77.6  Temp (24hrs), Avg:98.5 F (36.9 C), Min:98.4 F (36.9 C), Max:98.5 F (36.9 C)  Recent Labs  Lab 11/20/19 1908 11/21/19 2036 11/22/19 0613  WBC 14.4* 10.7* 6.8  CREATININE 0.80 0.65 0.56*    Estimated Creatinine Clearance: 89.7 mL/min (A) (by C-G formula based on SCr of 0.56 mg/dL (L)).    Allergies  Allergen Reactions  . Penicillins Swelling    Did it involve swelling of the face/tongue/throat, SOB, or low BP? Y Did it involve sudden or severe rash/hives, skin peeling, or any reaction on the inside of your mouth or nose? Y Did you need to seek medical attention at a hospital or doctor's office? N When did it last happen?Decades ago. If all above answers are "NO", may proceed with cephalosporin use.     Thank you for allowing pharmacy to be a part of this patient's care.  Lynelle Doctor 11/22/2019 8:38 AM

## 2019-11-23 ENCOUNTER — Encounter: Payer: Self-pay | Admitting: Physical Medicine and Rehabilitation

## 2019-11-23 LAB — URINE CULTURE: Culture: >=100000 — AB

## 2019-11-23 NOTE — Progress Notes (Unsigned)
Patient called to state that he was concerned that foley was not draining as well and was blocked.   LCSW had worked diligently to get patient [facility] services due to issues with his insurance and [facility] not added as these services were not available at that time. He is set to follow up with urology next week but was now reporting that he was having abdominal discomfort/pressure. [facility] contacted to add [employer] but would not be able to get nurse out for 24-48 hours.  Discussed options of going to ED or coming to office where we could change out the foley. Heather Roberts RN reviewed with  Delila Pereyra CMA and Hanley Ben RN on coude catheter placement. 16 Fr coude catheter placed without difficulty.

## 2019-11-23 NOTE — Telephone Encounter (Signed)
Have called and spoke to pt about concerns- has problems with foley needing to be flushed-not voiding via foley like he needs to-  have called SW at [facility] and addressed issue- Lucy -SW and Pam -PA are dealing with it- Please let pt know we've written the order and we are calling [facility] should hear from them soon/today.  thanks

## 2019-11-23 NOTE — Telephone Encounter (Signed)
Patients wife left 2 messages on my phone while I was off stressing the importance of a return phone call

## 2019-11-28 ENCOUNTER — Encounter
Payer: Commercial Managed Care - PPO | Attending: Physical Medicine and Rehabilitation | Admitting: Physical Medicine and Rehabilitation

## 2019-11-28 ENCOUNTER — Telehealth: Payer: Self-pay

## 2019-11-28 ENCOUNTER — Encounter: Payer: Self-pay | Admitting: Physical Medicine and Rehabilitation

## 2019-11-28 ENCOUNTER — Other Ambulatory Visit: Payer: Self-pay

## 2019-11-28 VITALS — BP 125/78 | HR 66 | Temp 97.7°F

## 2019-11-28 DIAGNOSIS — N319 Neuromuscular dysfunction of bladder, unspecified: Secondary | ICD-10-CM | POA: Diagnosis not present

## 2019-11-28 DIAGNOSIS — T07XXXA Unspecified multiple injuries, initial encounter: Secondary | ICD-10-CM | POA: Insufficient documentation

## 2019-11-28 DIAGNOSIS — W11XXXS Fall on and from ladder, sequela: Secondary | ICD-10-CM

## 2019-11-28 DIAGNOSIS — S24103A Unspecified injury at T7-T10 level of thoracic spinal cord, initial encounter: Secondary | ICD-10-CM | POA: Insufficient documentation

## 2019-11-28 DIAGNOSIS — G834 Cauda equina syndrome: Secondary | ICD-10-CM | POA: Diagnosis not present

## 2019-11-28 DIAGNOSIS — M792 Neuralgia and neuritis, unspecified: Secondary | ICD-10-CM

## 2019-11-28 DIAGNOSIS — S43005A Unspecified dislocation of left shoulder joint, initial encounter: Secondary | ICD-10-CM | POA: Diagnosis not present

## 2019-11-28 DIAGNOSIS — G8918 Other acute postprocedural pain: Secondary | ICD-10-CM

## 2019-11-28 DIAGNOSIS — Z981 Arthrodesis status: Secondary | ICD-10-CM

## 2019-11-28 DIAGNOSIS — S343XXS Injury of cauda equina, sequela: Secondary | ICD-10-CM

## 2019-11-28 MED ORDER — TRAMADOL HCL 50 MG PO TABS: 50.0000 mg | ORAL_TABLET | Freq: Four times a day (QID) | ORAL | 0 refills | Status: DC | PRN

## 2019-11-28 MED ORDER — TRAZODONE HCL 50 MG PO TABS: 50.0000 mg | ORAL_TABLET | Freq: Every day | ORAL | 3 refills | Status: DC

## 2019-11-28 MED ORDER — METHOCARBAMOL 500 MG PO TABS: 500.0000 mg | ORAL_TABLET | Freq: Four times a day (QID) | ORAL | 5 refills | Status: DC

## 2019-11-28 MED ORDER — OXYCODONE HCL 5 MG PO TABS: 10.0000 mg | ORAL_TABLET | Freq: Two times a day (BID) | ORAL | 0 refills | Status: DC | PRN

## 2019-11-28 MED ORDER — XTAMPZA ER 9 MG PO C12A: 9.0000 mg | EXTENDED_RELEASE_CAPSULE | Freq: Two times a day (BID) | ORAL | 0 refills | Status: DC

## 2019-11-28 MED ORDER — ENOXAPARIN SODIUM 40 MG/0.4ML ~~LOC~~ SOLN: 40.0000 mg | SUBCUTANEOUS | 0 refills | Status: DC

## 2019-11-28 NOTE — Telephone Encounter (Signed)
OK- refilled today for next refill- thank you

## 2019-11-28 NOTE — Patient Instructions (Signed)
Patient is a 63 yr old male with Cauda equina syndrome- ASIA D SCI s/p fall and L shoulder dislocation and L trimalleolar fx 10/16/2019.   1. Still taking Gabapentin 400/400/600 mg until runs out of 400 mg and then will move to 600 mg TID.  2. Refill Trazodone 50 mg nightly for sleep  3. Refill Tramadol 50 mg QID as needed- #90  4. Refill Oxycodone 10 mg BID as needed (5 mg tabs) so #120  5. Xtampa- 9 mg BID- refilled x 1 month supply #60  6. Surgery 11/4- so needs 1 month of Lovenox  7. If having looser stools, needs to back down on Colace first.   8. Stop dig stim unless needed- only if not using bathroom.  9. DID have ileus initially- please follow discussion of using suppository if needed, could be not fully emptying.  10.Urology- might pee but not empty- see if they can do bladder scan.  12.  F/u 6 weeks

## 2019-11-28 NOTE — Progress Notes (Signed)
Subjective:    Patient ID: Louis Jordan, male    DOB: 1956/01/08, 63 y.o.   MRN: [id number]  HPI   Urology appointment 12/18 at 8:30-   Is doing BMs 2x/day- stomach cramping and goes to bathroom Does bowel program at night- dig stim- 1 of 3 times uses suppository. Does dig stim "because told to" 75- 90% of BM BEFORE doing BM. Does dig stim but "doesn't need it"/. Did do Suppository last 2 days. When feels like hasn't used restroom enough.  Uses same cramping from before SCI to tell him he needs to use bathroom.   Not firm BM. Not real loose/liquid.  Doing Miralax 2x/day, senokot 2x/day and Colace 2x/day.   Thinks can pee- when takes catheter out. Can fill up bag, when needs to pee- whenever has BM.     Went to ER for "ileus"- was told had ileus- but didn't have ileus per MD said it wasn't ileus.     Xtampa- picked up yesterday. Picked up 42 pills, can't give him more from that Rx.  Taking Oxycodone- just at night time- 10 mg nightly- only time takes it. Took a couple Sunday since was out of Xtampa.  Takes sleeping pill and Robaxin and Oxycodone- sleeps til 3-4 am- then takes 1 Tramadol til 9:30 am when wakes up. Still in pain, but can keep him under control with this regimen. Xtampa and Gabapentin at 9:30am  Has pulled back on pain meds. Can tell when he takes Gabapentin, takes 2 hrs to kick in.   Takes Robaxin -takes 4x/day.  Notices when takes it. Notices most in L shoulder/and legs. Gets stiff and tingling and helps with the gabapentin.   Had thrush - so bad couldn't eat- lost skin on inside of mouth- nystatin susp x 7 days.  Had ileus based on CT on 11/20/2019- f/u 12/7/ abd KUB.  If touches groin/scrotum- Normal touch NOW Butt- donut- getting better every day- getting feeling back- not normal, but can feel touch now.   Pain is more!- Centered around groin/core. Majority of pain is that core, not leg anymore.   Pain Inventory Average Pain 5 Pain  Right Now 5 My pain is dull, tingling and aching  In the last 24 hours, has pain interfered with the following? General activity 3 Relation with others 4 Enjoyment of life 3 What TIME of day is your pain at its worst? morning Sleep (in general) Fair  Pain is worse with: other Pain improves with: medication Relief from Meds: 8  Mobility ability to climb steps?  no do you drive?  no use a wheelchair transfers alone  Function employed # of hrs/week . disabled: date disabled . I need assistance with the following:  dressing, bathing, toileting, meal prep and household duties  Neuro/Psych bladder control problems bowel control problems weakness numbness tingling trouble walking  Prior Studies Any changes since last visit?  no  Physicians involved in your care Any changes since last visit?  no   Family History  Problem Relation Age of Onset  . Heart disease Mother   . Alzheimer's disease Mother   . Pancreatic cancer Father   . High blood pressure Brother    Social History   Socioeconomic History  . Marital status: Married    Spouse name: Not on file  . Number of children: Not on file  . Years of education: Not on file  . Highest education level: Not on file  Occupational History  . Not on  file  Tobacco Use  . Smoking status: Never Smoker  . Smokeless tobacco: Never Used  Substance and Sexual Activity  . Alcohol use: Yes    Comment: RARE  . Drug use: Never  . Sexual activity: Not on file  Other Topics Concern  . Not on file  Social History Narrative  . Not on file   Social Determinants of Health   Financial Resource Strain:   . Difficulty of Paying Living Expenses: Not on file  Food Insecurity:   . Worried About Programme researcher, broadcasting/film/video in the Last Year: Not on file  . Ran Out of Food in the Last Year: Not on file  Transportation Needs:   . Lack of Transportation (Medical): Not on file  . Lack of Transportation (Non-Medical): Not on file  Physical  Activity:   . Days of Exercise per Week: Not on file  . Minutes of Exercise per Session: Not on file  Stress:   . Feeling of Stress : Not on file  Social Connections:   . Frequency of Communication with Friends and Family: Not on file  . Frequency of Social Gatherings with Friends and Family: Not on file  . Attends Religious Services: Not on file  . Active Member of Clubs or Organizations: Not on file  . Attends Banker Meetings: Not on file  . Marital Status: Not on file   Past Surgical History:  Procedure Laterality Date  . APPENDECTOMY    . KNEE SURGERY Right   . LUMBAR SPINE SURGERY  10/16/2019   L1 burst fracture with epidural hematoma and canal stenosis  . OPEN REDUCTION INTERNAL FIXATION (ORIF) TIBIA/FIBULA FRACTURE Left 10/19/2019   Procedure: OPEN REDUCTION INTERNAL FIXATION (ORIF) TIBIA/FIBULA FRACTURE;  Surgeon: Roby Lofts, MD;  Location: [facility] OR;  Service: Orthopedics;  Laterality: Left;  . SHOULDER ARTHROSCOPY WITH BANKART REPAIR Left 10/19/2019   Procedure: SHOULDER ARTHROSCOPY WITH BANKART REPAIR, EXTENSIVE DEBRIDEMENT, LOOSE BODY REMOVAL;  Surgeon: Bjorn Pippin, MD;  Location: [facility] OR;  Service: Orthopedics;  Laterality: Left;  . STRABISMUS SURGERY    . TONSILLECTOMY     Past Medical History:  Diagnosis Date  . Bundle branch block, right   . Complication of anesthesia   . Hand pain    mild OA bilateral  hands  . Left knee injury 1970   requiring surgery for repair of ligaments/cartilage damage with nerve injury that took 20 years to resolve  . MVP (mitral valve prolapse)   . PONV (postoperative nausea and vomiting)    BP 125/78   Pulse 66   Temp 97.7 F (36.5 C)   SpO2 95%   Opioid Risk Score:   Fall Risk Score:  `1  Depression screen PHQ 2/9  Depression screen PHQ 2/9 11/14/2019  Decreased Interest 0  Down, Depressed, Hopeless 2  PHQ - 2 Score 2  Altered sleeping 1  Tired, decreased energy 1  Change in appetite 0  Feeling bad or  failure about yourself  3  Trouble concentrating 1  Moving slowly or fidgety/restless 3  Suicidal thoughts 0  PHQ-9 Score 11  Difficult doing work/chores Somewhat difficult    Review of Systems  Constitutional: Negative.   HENT: Negative.   Eyes: Negative.   Cardiovascular: Negative.   Gastrointestinal: Positive for abdominal pain.       Neurogenic  Endocrine: Negative.   Genitourinary:       Neurogenic   Musculoskeletal: Positive for arthralgias, back pain and gait problem.  Skin: Negative.   Allergic/Immunologic: Negative.   Neurological: Positive for weakness and numbness.       Tingling  All other systems reviewed and are negative.      Objective:   Physical Exam Awake, alert, appropriate, accompanied by wife, NAD In manual w/c, L arm in splint L foot in walking boot- still NWB RLE- HF 5-/5, KE/KF 5-/5, DF 5-/5, PF 5-/5, EHL 5-/5     Assessment & Plan:  Patient is a 63 yr old male with Cauda equina syndrome- ASIA D SCI s/p fall and L shoulder dislocation and L trimalleolar fx 10/16/2019.   1. Still taking Gabapentin 400/400/600 mg until runs out of 400 mg and then will move to 600 mg TID.  2. Refill Trazodone 50 mg nightly for sleep  3. Refill Tramadol 50 mg QID as needed- #90  4. Refill Oxycodone 10 mg BID as needed (5 mg tabs) so #120  5. Xtampa- 9 mg BID- refilled x 1 month supply #60  6. Surgery 11/4- so needs 1 month of Lovenox  7. If having looser stools, needs to back down on Colace first.   8. Stop dig stim unless needed- only if not using bathroom.  9. DID have ileus initially- please follow discussion of using suppository if needed, could be not fully emptying.  10.Urology- might pee but not empty- see if they can do bladder scan.  12.  F/u 6 weeks  I spent a total of 45 minutes on appointment- more than 30 minutes on education as detailed above.

## 2019-11-28 NOTE — Telephone Encounter (Signed)
[facility] called stating patient picked up #42 of Xtampza because that is all they had and he couldn't wait til more shipment came in so the rest of the #60 has been forfeited.

## 2019-12-01 ENCOUNTER — Telehealth: Payer: Self-pay | Admitting: *Deleted

## 2019-12-01 NOTE — Telephone Encounter (Signed)
Mr Arizpe called with concerns about his bowel habits and is requesting to speak with Dr Dagoberto Ligas. I have explained that Dr Dagoberto Ligas is not here until Monday.  He is afraid of developing an ileus again . He reports he is doing his bowel program and is passing stool, but fears it is not enough and may end up back in the ED with an ileus.  He has seen Dr Ranell Patrick and I will send the message and ask that she give him a call. His number is [phone].

## 2019-12-01 NOTE — Telephone Encounter (Signed)
Prior auth initiated for Enoxaparin (Lovenox) 40 mg/0.39ml injections with [facility].

## 2019-12-02 ENCOUNTER — Other Ambulatory Visit: Payer: Self-pay | Admitting: Physical Medicine and Rehabilitation

## 2019-12-02 MED ORDER — DOCUSATE SODIUM 283 MG RE ENEM: 1.0000 | ENEMA | Freq: Every day | RECTAL | 0 refills | Status: DC

## 2019-12-05 NOTE — Telephone Encounter (Signed)
Patient contacted my personal extension at 4:15 pm on Friday after I had left.  He is asking for a physician to call him.  He states that he has left messages and no response

## 2019-12-05 NOTE — Telephone Encounter (Signed)
Attempted to call x3 on Monday AM 10:12 am L/M that will call later.

## 2019-12-05 NOTE — Telephone Encounter (Signed)
Spoke ot pt- he was able ot use an Over the counter enema- sounds like minienema, but could have been Fleets- anyway, got cleaned out.  Also having difficulty with Hemorrhoid as well. It's been 1 month and not getting much better in size, pain, etc.  Suggested calling PCP to ask also for help on that.  Otherwise, doing well now.

## 2019-12-05 NOTE — Telephone Encounter (Addendum)
Prior Louis Jordan was denied by [facility], but Dr Dagoberto Ligas called and spoke with pharmacist reviewing the auth request at [facility] and has now been approved for an additional 30 days of treatment. The approval runs through 12/04/20.

## 2019-12-11 ENCOUNTER — Other Ambulatory Visit: Payer: Self-pay | Admitting: Physical Medicine and Rehabilitation

## 2019-12-20 ENCOUNTER — Telehealth: Payer: Self-pay

## 2019-12-20 NOTE — Telephone Encounter (Signed)
Katie RN [facility] called requesting verbal orders for 1wk1 then 2wk2 then PRN 3 for foley and cath care.  Called her back and approved verbal orders.

## 2020-01-04 ENCOUNTER — Telehealth: Payer: Self-pay

## 2020-01-04 NOTE — Telephone Encounter (Signed)
[facility] RN [facility] called requesting verbal orders for 1wk1.  Called her back left message approving verbal orders.

## 2020-01-09 ENCOUNTER — Encounter: Payer: Self-pay | Admitting: Physical Medicine and Rehabilitation

## 2020-01-09 ENCOUNTER — Encounter
Payer: Commercial Managed Care - PPO | Attending: Physical Medicine and Rehabilitation | Admitting: Physical Medicine and Rehabilitation

## 2020-01-09 ENCOUNTER — Other Ambulatory Visit: Payer: Self-pay

## 2020-01-09 VITALS — Temp 97.7°F | Ht 72.0 in | Wt 144.0 lb

## 2020-01-09 DIAGNOSIS — M792 Neuralgia and neuritis, unspecified: Secondary | ICD-10-CM | POA: Diagnosis not present

## 2020-01-09 DIAGNOSIS — Z5181 Encounter for therapeutic drug level monitoring: Secondary | ICD-10-CM | POA: Diagnosis present

## 2020-01-09 DIAGNOSIS — N319 Neuromuscular dysfunction of bladder, unspecified: Secondary | ICD-10-CM | POA: Diagnosis present

## 2020-01-09 DIAGNOSIS — S32001S Stable burst fracture of unspecified lumbar vertebra, sequela: Secondary | ICD-10-CM | POA: Diagnosis present

## 2020-01-09 DIAGNOSIS — S343XXS Injury of cauda equina, sequela: Secondary | ICD-10-CM | POA: Diagnosis present

## 2020-01-09 DIAGNOSIS — Z79899 Other long term (current) drug therapy: Secondary | ICD-10-CM | POA: Insufficient documentation

## 2020-01-09 DIAGNOSIS — G834 Cauda equina syndrome: Secondary | ICD-10-CM | POA: Insufficient documentation

## 2020-01-09 DIAGNOSIS — S24103A Unspecified injury at T7-T10 level of thoracic spinal cord, initial encounter: Secondary | ICD-10-CM | POA: Insufficient documentation

## 2020-01-09 DIAGNOSIS — T07XXXA Unspecified multiple injuries, initial encounter: Secondary | ICD-10-CM | POA: Diagnosis present

## 2020-01-09 DIAGNOSIS — K592 Neurogenic bowel, not elsewhere classified: Secondary | ICD-10-CM

## 2020-01-09 MED ORDER — OXYCODONE HCL 5 MG PO TABS: 10.0000 mg | ORAL_TABLET | Freq: Two times a day (BID) | ORAL | 0 refills | Status: DC | PRN

## 2020-01-09 MED ORDER — DULOXETINE HCL 30 MG PO CPEP: 30.0000 mg | ORAL_CAPSULE | Freq: Every day | ORAL | 5 refills | Status: DC

## 2020-01-09 MED ORDER — ONDANSETRON HCL 4 MG PO TABS: 4.0000 mg | ORAL_TABLET | Freq: Every day | ORAL | 1 refills | Status: DC | PRN

## 2020-01-09 NOTE — Progress Notes (Signed)
Subjective:    Patient ID: Louis Jordan, male    DOB: 30-Dec-1955, 64 y.o.   MRN: [id number]  HPI   Patient is a 64 yr old male with Cauda equina syndrome- ASIA D SCI s/p fall and L shoulder dislocation and L trimalleolar fx 10/16/2019.    Start doing PT on L leg next week- walked 1/4 mile yesterday- paid for it later. Has been able to weight bear on LLE ~ 12/27/19 Took brace off LUE the week prior to that- 1/4- doing therapy on shoulder - going slower due to fragility.    Didn't work to get it out- 12/30- tried taking it out, but couldn't pee/void- took 1 hr to get it in- going to try again 1/28- ad also got UTI.   Still on Flomax- 0.8 mg 1x/day.   Primary care should check Iron levels-  Asking for new referral for [place]- order ran out on 1/23- ended-336- [phone].  Wear LSO at least 4 more weeks- per Neurosurgery.  Lays down 18 hours/day. Pain is getting more severe.  In band Penis/anus- burning and throbbing-  actually woke him from sleep.  Ready to jump out of window,   Worst time is night and sit on hard commode- depression and pain sets in.  BMs- are on a regimen- 1-2x/day- no more dig stim needed- never gone more than 24 hours without a BM.  Swelling above inguinal canal on R side- concerned that might be a hernia - come up in last few days.       Pain Inventory Average Pain 6 Pain Right Now 6 My pain is sharp, stabbing and tingling  In the last 24 hours, has pain interfered with the following? General activity 8 Relation with others 8 Enjoyment of life 8 What TIME of day is your pain at its worst? morning Sleep (in general) Good  Pain is worse with: walking, standing and some activites Pain improves with: rest and medication Relief from Meds: 5  Mobility walk without assistance walk with assistance use a cane  Function I need assistance with the following:  dressing, bathing, toileting, meal prep, household duties and  shopping  Neuro/Psych bladder control problems weakness numbness tremor tingling trouble walking spasms dizziness confusion depression anxiety  Prior Studies Any changes since last visit?  no  Physicians involved in your care Any changes since last visit?  no   Family History  Problem Relation Age of Onset  . Heart disease Mother   . Alzheimer's disease Mother   . Pancreatic cancer Father   . High blood pressure Brother    Social History   Socioeconomic History  . Marital status: Married    Spouse name: Not on file  . Number of children: Not on file  . Years of education: Not on file  . Highest education level: Not on file  Occupational History  . Not on file  Tobacco Use  . Smoking status: Never Smoker  . Smokeless tobacco: Never Used  Substance and Sexual Activity  . Alcohol use: Yes    Comment: RARE  . Drug use: Never  . Sexual activity: Not on file  Other Topics Concern  . Not on file  Social History Narrative  . Not on file   Social Determinants of Health   Financial Resource Strain:   . Difficulty of Paying Living Expenses: Not on file  Food Insecurity:   . Worried About Charity fundraiser in the Last Year: Not on  file  . Ran Out of Food in the Last Year: Not on file  Transportation Needs:   . Lack of Transportation (Medical): Not on file  . Lack of Transportation (Non-Medical): Not on file  Physical Activity:   . Days of Exercise per Week: Not on file  . Minutes of Exercise per Session: Not on file  Stress:   . Feeling of Stress : Not on file  Social Connections:   . Frequency of Communication with Friends and Family: Not on file  . Frequency of Social Gatherings with Friends and Family: Not on file  . Attends Religious Services: Not on file  . Active Member of Clubs or Organizations: Not on file  . Attends Banker Meetings: Not on file  . Marital Status: Not on file   Past Surgical History:  Procedure Laterality Date   . APPENDECTOMY    . KNEE SURGERY Right   . LUMBAR SPINE SURGERY  10/16/2019   L1 burst fracture with epidural hematoma and canal stenosis  . OPEN REDUCTION INTERNAL FIXATION (ORIF) TIBIA/FIBULA FRACTURE Left 10/19/2019   Procedure: OPEN REDUCTION INTERNAL FIXATION (ORIF) TIBIA/FIBULA FRACTURE;  Surgeon: Roby Lofts, MD;  Location: [facility] OR;  Service: Orthopedics;  Laterality: Left;  . SHOULDER ARTHROSCOPY WITH BANKART REPAIR Left 10/19/2019   Procedure: SHOULDER ARTHROSCOPY WITH BANKART REPAIR, EXTENSIVE DEBRIDEMENT, LOOSE BODY REMOVAL;  Surgeon: Bjorn Pippin, MD;  Location: [facility] OR;  Service: Orthopedics;  Laterality: Left;  . STRABISMUS SURGERY    . TONSILLECTOMY     Past Medical History:  Diagnosis Date  . Bundle branch block, right   . Complication of anesthesia   . Hand pain    mild OA bilateral  hands  . Left knee injury 1970   requiring surgery for repair of ligaments/cartilage damage with nerve injury that took 20 years to resolve  . MVP (mitral valve prolapse)   . PONV (postoperative nausea and vomiting)    Temp 97.7 F (36.5 C)   Ht 6' (1.829 m)   Wt 144 lb (65.3 kg)   BMI 19.53 kg/m   Opioid Risk Score:   Fall Risk Score:  `1  Depression screen PHQ 2/9  Depression screen PHQ 2/9 11/14/2019  Decreased Interest 0  Down, Depressed, Hopeless 2  PHQ - 2 Score 2  Altered sleeping 1  Tired, decreased energy 1  Change in appetite 0  Feeling bad or failure about yourself  3  Trouble concentrating 1  Moving slowly or fidgety/restless 3  Suicidal thoughts 0  PHQ-9 Score 11  Difficult doing work/chores Somewhat difficult     Review of Systems  Constitutional: Negative.   HENT: Negative.   Eyes: Negative.   Respiratory: Negative.   Cardiovascular: Negative.   Gastrointestinal: Positive for abdominal pain.  Endocrine: Negative.   Genitourinary: Positive for difficulty urinating and dysuria.  Musculoskeletal: Positive for arthralgias, gait problem and myalgias.   Skin: Negative.   Allergic/Immunologic: Negative.   Neurological: Positive for tremors, weakness and numbness.  Hematological: Negative.   Psychiatric/Behavioral: Positive for confusion and dysphoric mood. The patient is nervous/anxious.   All other systems reviewed and are negative.      Objective:   Physical Exam   Awake, alert, appropriate, accompanied by wife, NAD R lower abdomen/pelvis just above inguinal ligament has moderate swelling- mildly TTP- no bruising/no erythema/no drainage- no strangulation of bowel appearing- tissue appears healthy.   Went over CT of bad/pelvis from 12/6- nothing there correlating to lower R abdomen,  Assessment & Plan:  Patient is a 64 yr old male with Cauda equina syndrome- ASIA D SCI s/p fall and L shoulder dislocation and L trimalleolar fx 10/16/2019.    1. Primary care should check Iron levels-   2. Asking for new referral for [facility]- order ran out on 1/23- ended-336- [phone]. Need referral to handle Foley  3. Needs to discuss swelling in abdomen with PCP -likely a moderate sized hernia- based on exam    4.  On gabapentin 600 mg TID; Add Duloxetine 30 mg daily x 1week then 60 mg daily. For nerve pain.  5 Can stop Lovenox shots completely as of today.  6. Zofran 4 mg BID as needed #40 with 1 REFILL  7.  Suggest that if cannot void at next time, suggest- doing in/out caths until can void.   8. F/U in 4 weeks. Doesn't need refills of other meds right now. Refill Oxycodone- but tries to take more tramadol to see if can wean Oxycodone somewhat.   9. I spent a total of 45 minutes on total visit More than 30 minutes going over bladder, nerve pain and bowel. And education. Also asking for ST disability forms to be filled out.

## 2020-01-09 NOTE — Progress Notes (Signed)
[facility] order changed into swab collection due to patient having foley catheter placement at time of test/appointment

## 2020-01-09 NOTE — Patient Instructions (Addendum)
Patient is a 64 yr old male with Cauda equina syndrome- ASIA D SCI s/p fall and L shoulder dislocation and L trimalleolar fx 10/16/2019.    1. Primary care should check Iron levels-   2. Asking for new referral for [facility]- order ran out on 1/23- ended-336- [phone]. Need referral to handle Foley  3. Needs to discuss swelling in abdomen with PCP -likely a moderate sized hernia- based on exam    4.  On gabapentin 600 mg TID; Add Duloxetine 30 mg daily x 1week then 60 mg daily. For nerve pain.  5 Can stop Lovenox shots completely as of today.  6. Zofran 4 mg BID as needed #40 with 1 REFILL  7.  Suggest that if cannot void at next time, suggest- doing in/out caths until can void.   8. F/U in 4 weeks. Doesn't need refills of other meds right now. Refill Oxycodone- but tries to take more tramadol to see if can wean Oxycodone somewhat.

## 2020-01-12 ENCOUNTER — Other Ambulatory Visit: Payer: Self-pay

## 2020-01-12 ENCOUNTER — Emergency Department (HOSPITAL_COMMUNITY)
Admission: EM | Admit: 2020-01-12 | Discharge: 2020-01-13 | Disposition: A | Discharge status: Home / Self Care | Payer: Commercial Managed Care - PPO | Source: Home / Self Care | Attending: Emergency Medicine | Admitting: Emergency Medicine

## 2020-01-12 ENCOUNTER — Telehealth: Payer: Self-pay | Admitting: *Deleted

## 2020-01-12 ENCOUNTER — Emergency Department (HOSPITAL_COMMUNITY): Payer: Commercial Managed Care - PPO

## 2020-01-12 DIAGNOSIS — Y732 Prosthetic and other implants, materials and accessory gastroenterology and urology devices associated with adverse incidents: Secondary | ICD-10-CM | POA: Insufficient documentation

## 2020-01-12 DIAGNOSIS — T83511A Infection and inflammatory reaction due to indwelling urethral catheter, initial encounter: Secondary | ICD-10-CM

## 2020-01-12 DIAGNOSIS — R1031 Right lower quadrant pain: Secondary | ICD-10-CM | POA: Insufficient documentation

## 2020-01-12 DIAGNOSIS — N390 Urinary tract infection, site not specified: Secondary | ICD-10-CM

## 2020-01-12 DIAGNOSIS — T83518A Infection and inflammatory reaction due to other urinary catheter, initial encounter: Secondary | ICD-10-CM | POA: Diagnosis not present

## 2020-01-12 DIAGNOSIS — Z20822 Contact with and (suspected) exposure to covid-19: Secondary | ICD-10-CM | POA: Insufficient documentation

## 2020-01-12 DIAGNOSIS — Z79899 Other long term (current) drug therapy: Secondary | ICD-10-CM | POA: Insufficient documentation

## 2020-01-12 LAB — DRUG TOX MONITOR 1 W/CONF, ORAL FLD
Amphetamines: NEGATIVE ng/mL (ref <10)
Barbiturates: NEGATIVE ng/mL (ref <10)
Benzodiazepines: NEGATIVE ng/mL (ref <0.50)
Buprenorphine: NEGATIVE ng/mL (ref <0.10)
Cocaine: NEGATIVE ng/mL (ref <5.0)
Codeine: NEGATIVE ng/mL (ref <2.5)
Dihydrocodeine: NEGATIVE ng/mL (ref <2.5)
Fentanyl: NEGATIVE ng/mL (ref <0.10)
Heroin Metabolite: NEGATIVE ng/mL (ref <1.0)
Hydrocodone: NEGATIVE ng/mL (ref <2.5)
Hydromorphone: NEGATIVE ng/mL (ref <2.5)
MARIJUANA: NEGATIVE ng/mL (ref <2.5)
MDMA: NEGATIVE ng/mL (ref <10)
Meprobamate: NEGATIVE ng/mL (ref <2.5)
Methadone: NEGATIVE ng/mL (ref <5.0)
Morphine: NEGATIVE ng/mL (ref <2.5)
Nicotine Metabolite: NEGATIVE ng/mL (ref <5.0)
Norhydrocodone: NEGATIVE ng/mL (ref <2.5)
Noroxycodone: 3.2 ng/mL — ABNORMAL HIGH (ref <2.5)
Opiates: POSITIVE ng/mL — AB (ref <2.5)
Oxycodone: 8.7 ng/mL — ABNORMAL HIGH (ref <2.5)
Oxymorphone: NEGATIVE ng/mL (ref <2.5)
Phencyclidine: NEGATIVE ng/mL (ref <10)
Tapentadol: NEGATIVE ng/mL (ref <5.0)
Tramadol: 93.1 ng/mL — ABNORMAL HIGH (ref <5.0)
Tramadol: POSITIVE ng/mL — AB (ref <5.0)
Zolpidem: NEGATIVE ng/mL (ref <5.0)

## 2020-01-12 LAB — CBC WITH DIFFERENTIAL/PLATELET
Abs Immature Granulocytes: 0.04 10*3/uL (ref 0.00–0.07)
Basophils Absolute: 0 10*3/uL (ref 0.0–0.1)
Basophils Relative: 0 %
Eosinophils Absolute: 0 10*3/uL (ref 0.0–0.5)
Eosinophils Relative: 0 %
HCT: 38.7 % — ABNORMAL LOW (ref 39.0–52.0)
Hemoglobin: 12.7 g/dL — ABNORMAL LOW (ref 13.0–17.0)
Immature Granulocytes: 0 %
Lymphocytes Relative: 3 %
Lymphs Abs: 0.3 10*3/uL — ABNORMAL LOW (ref 0.7–4.0)
MCH: 30.9 pg (ref 26.0–34.0)
MCHC: 32.8 g/dL (ref 30.0–36.0)
MCV: 94.2 fL (ref 80.0–100.0)
Monocytes Absolute: 0.3 10*3/uL (ref 0.1–1.0)
Monocytes Relative: 3 %
Neutro Abs: 9.4 10*3/uL — ABNORMAL HIGH (ref 1.7–7.7)
Neutrophils Relative %: 94 %
Platelets: 335 10*3/uL (ref 150–400)
RBC: 4.11 MIL/uL — ABNORMAL LOW (ref 4.22–5.81)
RDW: 12.6 % (ref 11.5–15.5)
WBC: 9.9 10*3/uL (ref 4.0–10.5)
nRBC: 0 % (ref 0.0–0.2)

## 2020-01-12 LAB — DRUG TOX ALC METAB W/CON, ORAL FLD: Alcohol Metabolite: NEGATIVE ng/mL (ref <25)

## 2020-01-12 NOTE — ED Notes (Signed)
Patient cleaned and peri care performed, new brief applied.

## 2020-01-12 NOTE — ED Provider Notes (Signed)
[facility]ER[facility]ENCY DEPT Provider Note: Lowella Dell, MD, FACEP  CSN: [id number] MRN: [id number] ARRIVAL: 01/12/20 at 2230 ROOM: [facility]14/[facility]14   CHIEF COMPLAINT  Urinary Tract Infection   HISTORY OF PRESENT ILLNESS  01/12/20 11:15 PM Louis Jordan is a 64 y.o. male who fell about 18 feet from a ladder and in October of last year.  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  He has an indwelling Foley catheter.  This morning he awoke with nausea, chills and fever.  He was seen by urology and had his Foley catheter replaced.  He was started on Cipro for suspected urinary tract infection.  During placement of the Foley there was some type of trauma as he is now passing blood around the Foley although the urine draining from the Foley is not itself bloody.  He is here because his symptoms worsened this evening with chills and increased fever (202.4 at home).  He took Tylenol prior to transport.  He was given Zofran 4 mg IV by EMS for treatment of his nausea.   He has a bulge in his right lower quadrant and is concerned he may have an abdominal wall hernia.  This bulge is nontender.   Past Medical History:  Diagnosis Date  . Bundle branch block, right   . Complication of anesthesia   . Hand pain    mild OA bilateral  hands  . Left knee injury 1970   requiring surgery for repair of ligaments/cartilage damage with nerve injury that took 20 years to resolve  . MVP (mitral valve prolapse)   . PONV (postoperative nausea and vomiting)     Past Surgical History:  Procedure Laterality Date  . APPENDECTOMY    . KNEE SURGERY Right   . LUMBAR SPINE SURGERY  10/16/2019   L1 burst fracture with epidural hematoma and canal stenosis  . OPEN REDUCTION INTERNAL FIXATION (ORIF) TIBIA/FIBULA FRACTURE Left 10/19/2019   Procedure: OPEN REDUCTION INTERNAL FIXATION (ORIF) TIBIA/FIBULA FRACTURE;  Surgeon: Roby Lofts, MD;  Location: [facility] OR;  Service:  Orthopedics;  Laterality: Left;  . SHOULDER ARTHROSCOPY WITH BANKART REPAIR Left 10/19/2019   Procedure: SHOULDER ARTHROSCOPY WITH BANKART REPAIR, EXTENSIVE DEBRIDEMENT, LOOSE BODY REMOVAL;  Surgeon: Bjorn Pippin, MD;  Location: [facility] OR;  Service: Orthopedics;  Laterality: Left;  . STRABISMUS SURGERY    . TONSILLECTOMY      Family History  Problem Relation Age of Onset  . Heart disease Mother   . Alzheimer's disease Mother   . Pancreatic cancer Father   . High blood pressure Brother     Social History   Tobacco Use  . Smoking status: Never Smoker  . Smokeless tobacco: Never Used  Substance Use Topics  . Alcohol use: Yes    Comment: RARE  . Drug use: Never    Prior to Admission medications   Medication Sig Start Date End Date Taking? Authorizing Provider  acetaminophen (TYLENOL) 650 MG CR tablet Take 650 mg by mouth every 8 (eight) hours as needed for pain.   Yes [provider]  bisacodyl (DULCOLAX) 10 MG suppository Place 1 suppository (10 mg total) rectally daily after supper. Patient taking differently: Place 10 mg rectally daily as needed for moderate constipation.  11/08/19  Yes Love, Evlyn Kanner, PA-C  ciprofloxacin (CIPRO) 250 MG tablet Take 250 mg by mouth 2 (two) times daily. 01/12/20  Yes [provider]  docusate sodium (COLACE) 100 MG capsule Take  1 capsule (100 mg total) by mouth 2 (two) times daily. 11/08/19  Yes Love, Evlyn Kanner, PA-C  DULoxetine (CYMBALTA) 30 MG capsule Take 1 capsule (30 mg total) by mouth daily. Take 1 tab  daily x 1 week than 2 tabs (60 mg) daily -for nerve pain Patient taking differently: Take 30-60 mg by mouth daily. Take 1 tab  daily x 1 week than 2 tabs (60 mg) daily -for nerve pain 01/09/20  Yes Lovorn, Aundra Millet, MD  ferrous sulfate 325 (65 FE) MG tablet Take 1 tablet (325 mg total) by mouth daily with breakfast. 11/08/19  Yes Love, Evlyn Kanner, PA-C  gabapentin (NEURONTIN) 600 MG tablet TAKE 1 TABLET(600 MG) BY MOUTH THREE TIMES  DAILY Patient taking differently: Take 600 mg by mouth 2 (two) times daily.  12/12/19  Yes Raulkar, Drema Pry, MD  hydrocortisone (ANUSOL-HC) 2.5 % rectal cream Place rectally 3 (three) times daily. 11/08/19  Yes Love, Evlyn Kanner, PA-C  methocarbamol (ROBAXIN) 500 MG tablet Take 1 tablet (500 mg total) by mouth 4 (four) times daily. Patient taking differently: Take 500 mg by mouth 3 (three) times daily.  11/28/19  Yes Lovorn, Aundra Millet, MD  Multiple Vitamins-Minerals (MULTIVITAMIN ADULT PO) Take 1 tablet by mouth daily.   Yes [provider]  ondansetron (ZOFRAN) 4 MG tablet Take 1 tablet (4 mg total) by mouth daily as needed for nausea or vomiting. 01/09/20 01/08/21 Yes Lovorn, Aundra Millet, MD  oxyCODONE (ROXICODONE) 5 MG immediate release tablet Take 2 tablets (10 mg total) by mouth 2 (two) times daily as needed for severe pain or breakthrough pain. Patient taking differently: Take 5-10 mg by mouth 2 (two) times daily as needed for severe pain or breakthrough pain.  01/09/20  Yes Lovorn, Aundra Millet, MD  oxyCODONE ER (XTAMPZA ER) 9 MG C12A Take 9 mg by mouth 2 (two) times daily. 11/28/19  Yes Lovorn, Aundra Millet, MD  polyethylene glycol (MIRALAX / GLYCOLAX) 17 g packet Take 17 g by mouth 2 (two) times daily. 11/08/19  Yes Love, Evlyn Kanner, PA-C  senna (SENOKOT) 8.6 MG TABS tablet Take 1 tablet (8.6 mg total) by mouth 2 (two) times daily. 11/08/19  Yes Love, Evlyn Kanner, PA-C  tamsulosin (FLOMAX) 0.4 MG CAPS capsule Take 2 capsules (0.8 mg total) by mouth daily after supper. 11/08/19  Yes Love, Evlyn Kanner, PA-C  traMADol (ULTRAM) 50 MG tablet Take 1 tablet (50 mg total) by mouth every 6 (six) hours as needed for moderate pain. 11/28/19  Yes Lovorn, Aundra Millet, MD  traZODone (DESYREL) 50 MG tablet Take 0.5-1 tablets (25-50 mg total) by mouth at bedtime as needed for sleep. Patient taking differently: Take 50 mg by mouth at bedtime.  11/08/19  Yes Love, Evlyn Kanner, PA-C  acetaminophen (TYLENOL) 325 MG tablet Take 2 tablets (650 mg  total) by mouth every 6 (six) hours as needed. Patient not taking: Reported on 01/13/2020 11/08/19   Love, Evlyn Kanner, PA-C  docusate sodium (ENEMEEZ) 283 MG enema Place 1 enema (283 mg total) rectally daily. Patient not taking: Reported on 01/13/2020 12/02/19   Horton Chin, MD  enoxaparin (LOVENOX) 40 MG/0.4ML injection Inject 0.4 mLs (40 mg total) into the skin daily. Patient not taking: Reported on 01/13/2020 11/28/19   Lovorn, Aundra Millet, MD  lidocaine (XYLOCAINE) 2 % jelly Place 1 application into the urethra as needed (cathing). 11/08/19   Love, Evlyn Kanner, PA-C  traZODone (DESYREL) 50 MG tablet Take 1 tablet (50 mg total) by mouth at bedtime. Patient not taking: Reported on  01/13/2020 11/28/19   Lovorn, Jinny Blossom, MD    Allergies Penicillins   REVIEW OF SYSTEMS  Negative except as noted here or in the History of Present Illness.   PHYSICAL EXAMINATION  Initial Vital Signs Blood pressure 123/79, pulse 84, temperature (!) 100.9 F (38.3 C), temperature source Rectal, resp. rate 18, SpO2 95 %.  Examination General: Well-developed, well-nourished male in no acute distress; appearance consistent with age of record HENT: normocephalic; atraumatic Eyes: pupils equal, round and reactive to light; extraocular muscles intact Neck: supple Heart: regular rate and rhythm Lungs: clear to auscultation bilaterally Abdomen: soft; nondistended; nontender right lower quadrant bulge without definable mass; bowel sounds present GU: Tanner V male, circumcised; Foley catheter in place draining cloudy amber urine, blood seen around Foley at urethral meatus Extremities: No acute deformity; pulses normal; no edema Neurologic: Awake, alert and oriented; motor function intact in all extremities; no facial droop Skin: Warm and dry Psychiatric: Normal mood and affect   RESULTS  Summary of this visit's results, reviewed and interpreted by myself:   EKG Interpretation  Date/Time:    Ventricular Rate:     PR Interval:    QRS Duration:   QT Interval:    QTC Calculation:   R Axis:     Text Interpretation:        Laboratory Studies: Results for orders placed or performed during the hospital encounter of 01/12/20 (from the past 24 hour(s))  CBC with Differential/Platelet     Status: Abnormal   Collection Time: 01/12/20 11:17 PM  Result Value Ref Range   WBC 9.9 4.0 - 10.5 K/uL   RBC 4.11 (L) 4.22 - 5.81 MIL/uL   Hemoglobin 12.7 (L) 13.0 - 17.0 g/dL   HCT 38.7 (L) 39.0 - 52.0 %   MCV 94.2 80.0 - 100.0 fL   MCH 30.9 26.0 - 34.0 pg   MCHC 32.8 30.0 - 36.0 g/dL   RDW 12.6 11.5 - 15.5 %   Platelets 335 150 - 400 K/uL   nRBC 0.0 0.0 - 0.2 %   Neutrophils Relative % 94 %   Neutro Abs 9.4 (H) 1.7 - 7.7 K/uL   Lymphocytes Relative 3 %   Lymphs Abs 0.3 (L) 0.7 - 4.0 K/uL   Monocytes Relative 3 %   Monocytes Absolute 0.3 0.1 - 1.0 K/uL   Eosinophils Relative 0 %   Eosinophils Absolute 0.0 0.0 - 0.5 K/uL   Basophils Relative 0 %   Basophils Absolute 0.0 0.0 - 0.1 K/uL   Immature Granulocytes 0 %   Abs Immature Granulocytes 0.04 0.00 - 0.07 K/uL  Basic metabolic panel     Status: Abnormal   Collection Time: 01/12/20 11:17 PM  Result Value Ref Range   Sodium 138 135 - 145 mmol/L   Potassium 3.8 3.5 - 5.1 mmol/L   Chloride 98 98 - 111 mmol/L   CO2 29 22 - 32 mmol/L   Glucose, Bld 137 (H) 70 - 99 mg/dL   BUN 12 8 - 23 mg/dL   Creatinine, Ser 0.84 0.61 - 1.24 mg/dL   Calcium 9.2 8.9 - 10.3 mg/dL   GFR calc non Af Amer >60 >60 mL/min   GFR calc Af Amer >60 >60 mL/min   Anion gap 11 5 - 15  Urinalysis, Routine w reflex microscopic     Status: Abnormal   Collection Time: 01/12/20 11:17 PM  Result Value Ref Range   Color, Urine YELLOW YELLOW   APPearance TURBID (A) CLEAR   Specific  Gravity, Urine 1.013 1.005 - 1.030   pH 8.0 5.0 - 8.0   Glucose, UA NEGATIVE NEGATIVE mg/dL   Hgb urine dipstick SMALL (A) NEGATIVE   Bilirubin Urine NEGATIVE NEGATIVE   Ketones, ur 5 (A) NEGATIVE mg/dL    Protein, ur NEGATIVE NEGATIVE mg/dL   Nitrite NEGATIVE NEGATIVE   Leukocytes,Ua LARGE (A) NEGATIVE   RBC / HPF 6-10 0 - 5 RBC/hpf   WBC, UA 21-50 0 - 5 WBC/hpf   Bacteria, UA FEW (A) NONE SEEN   WBC Clumps PRESENT    Mucus PRESENT    Amorphous Crystal PRESENT   Lactic acid, plasma     Status: None   Collection Time: 01/12/20 11:17 PM  Result Value Ref Range   Lactic Acid, Venous 0.7 0.5 - 1.9 mmol/L   Imaging Studies: CT ABDOMEN PELVIS WO CONTRAST  Result Date: 01/13/2020 CLINICAL DATA:  Right lower quadrant abdominal pain with palpable bulge, question hernia EXAM: CT ABDOMEN AND PELVIS WITHOUT CONTRAST TECHNIQUE: Multidetector CT imaging of the abdomen and pelvis was performed following the standard protocol without IV contrast. COMPARISON:  CT 11/20/2019 FINDINGS: Lower chest: Atelectatic changes noted in the lung bases. Normal heart size. No pericardial effusion. Hepatobiliary: No focal liver abnormality is seen. No gallstones, gallbladder wall thickening, or biliary dilatation. Pancreas: Unremarkable. No pancreatic ductal dilatation or surrounding inflammatory changes. Spleen: Normal in size without focal abnormality. Adrenals/Urinary Tract: Normal adrenal glands. No visible or contour deforming renal lesions. No urolithiasis or hydronephrosis. Urinary bladder is decompressed about an inflated Foley catheter with small volume high attenuation material in the bladder lumen. There is circumferential bladder wall thickening and luminal gas, nonspecific in the setting of instrumentation. Stomach/Bowel: Distal esophagus, stomach and duodenal sweep are unremarkable. No small bowel wall thickening or dilatation. No evidence of obstruction. The appendix is surgically absent. Moderate stool burden throughout the colon. No colonic thickening or dilatation. Vascular/Lymphatic: Atherosclerotic plaque within the normal caliber aorta. No suspicious or enlarged lymph nodes in the included lymphatic chains.  Reproductive: The prostate and seminal vesicles are unremarkable. Other: No free fluid. No free air. Bowel or fat containing hernia seen in the right lower quadrant. No soft tissue abnormality, fluid collection or other possible CT explanation for palpable abnormality. Musculoskeletal: Multilevel degenerative changes are present in the imaged portions of the spine. Long segment thoracolumbar fusion with transpedicular screws at T10, T11 and T12 as well as L2, L3 and L4. Fusion levels bridge a a multipart burst fracture deformity of L1 with approximately 60% height loss which is similar in appearance to comparison CT from 11/20/2019. No acute osseous abnormality or suspicious osseous lesion. IMPRESSION: 1. No visible hernia, lesion, fluid collection, or other possible CT explanation for palpable abnormality in the right lower quadrant. 2. No other CT explanation patient's symptoms. 3. Circumferential bladder wall thickening and luminal gas, nonspecific in the setting of instrumentation. Correlate with urinalysis to exclude cystitis. 4. Moderate stool burden throughout the colon. Correlate for constipation. 5. Stable postsurgical changes from prior thoracolumbar fusion spanning a an L1 burst fracture. No acute osseous abnormalities. Electronically Signed   By: Kreg ShropshirePrice  DeHay M.D.   On: 01/13/2020 01:19    ED COURSE and MDM  Nursing notes, initial and subsequent vitals signs, including pulse oximetry, reviewed and interpreted by myself.  Vitals:   01/12/20 2301 01/12/20 2330 01/13/20 0000 01/13/20 0100  BP: 123/79 118/70 116/66 107/65  Pulse: 84 81 73 73  Resp: 18 (!) 23 18 15   Temp: (!) 100.9  F (38.3 C)     TempSrc: Rectal     SpO2: 95% 94% 92% 93%   Medications  ciprofloxacin (CIPRO) IVPB 400 mg (400 mg Intravenous New Bag/Given 01/13/20 0104)  oxyCODONE (Oxy IR/ROXICODONE) immediate release tablet 10 mg (has no administration in time range)  methocarbamol (ROBAXIN) tablet 500 mg (has no  administration in time range)  traZODone (DESYREL) tablet 50 mg (has no administration in time range)  tamsulosin (FLOMAX) capsule 0.4 mg (has no administration in time range)  ketorolac (TORADOL) 15 MG/ML injection 15 mg (15 mg Intravenous Given 01/13/20 0103)   1:29 AM Urinalysis consistent with decent urinary tract infection that is related to indwelling Foley.  His Foley catheter was changed yesterday so we will not replace his catheter.  He was advised to continue taking ciprofloxacin 500 mg twice daily.  His CT shows no evidence of an abdominal wall hernia.  He may have a weakening in his abdominal wall related to spinal damage from his accident.  He is already on a bowel care regimen.  COVID-19 test pending.   PROCEDURES  Procedures   ED DIAGNOSES     ICD-10-CM   1. Urinary tract infection associated with indwelling urethral catheter, initial encounter [facility])  T83.511A    N39.0        Paula Libra, MD 01/13/20 0131

## 2020-01-12 NOTE — ED Triage Notes (Signed)
Pt comes to ed via ems, c/o urinary infection and discomfort, visited urology today for foley removal but unsuccessful with void, some amount of bleeding with insert at office. Pt placed on cipro via urology. Pt worried about dehydration and discomfort, pt reports fever. Pt took a tylenol at 21:41, ems placed 20 left forearm, 4 of Zofran, v/s on arrival 152/84, hr 92, spo2 96, cbg 123, temp 100.8, rr18.

## 2020-01-12 NOTE — Telephone Encounter (Signed)
Patients wife left a message stating that they do indeed need a refill on Xtampza.  She says that they may have forgotten to mention.

## 2020-01-13 ENCOUNTER — Other Ambulatory Visit: Payer: Self-pay | Admitting: Physical Medicine and Rehabilitation

## 2020-01-13 ENCOUNTER — Encounter (HOSPITAL_COMMUNITY): Payer: Self-pay | Admitting: Emergency Medicine

## 2020-01-13 ENCOUNTER — Inpatient Hospital Stay (HOSPITAL_COMMUNITY)
Admission: EM | Admit: 2020-01-13 | Discharge: 2020-01-19 | DRG: 699 | Disposition: A | Discharge status: Home / Self Care | Payer: Commercial Managed Care - PPO | Attending: Internal Medicine | Admitting: Internal Medicine

## 2020-01-13 ENCOUNTER — Other Ambulatory Visit: Payer: Self-pay

## 2020-01-13 ENCOUNTER — Telehealth: Payer: Self-pay | Admitting: *Deleted

## 2020-01-13 DIAGNOSIS — M545 Low back pain: Secondary | ICD-10-CM | POA: Diagnosis present

## 2020-01-13 DIAGNOSIS — Z20822 Contact with and (suspected) exposure to covid-19: Secondary | ICD-10-CM | POA: Diagnosis present

## 2020-01-13 DIAGNOSIS — R112 Nausea with vomiting, unspecified: Secondary | ICD-10-CM | POA: Diagnosis present

## 2020-01-13 DIAGNOSIS — F419 Anxiety disorder, unspecified: Secondary | ICD-10-CM | POA: Diagnosis present

## 2020-01-13 DIAGNOSIS — S343XXS Injury of cauda equina, sequela: Secondary | ICD-10-CM | POA: Diagnosis not present

## 2020-01-13 DIAGNOSIS — J90 Pleural effusion, not elsewhere classified: Secondary | ICD-10-CM | POA: Diagnosis present

## 2020-01-13 DIAGNOSIS — Z820 Family history of epilepsy and other diseases of the nervous system: Secondary | ICD-10-CM | POA: Diagnosis not present

## 2020-01-13 DIAGNOSIS — N12 Tubulo-interstitial nephritis, not specified as acute or chronic: Secondary | ICD-10-CM | POA: Diagnosis present

## 2020-01-13 DIAGNOSIS — I341 Nonrheumatic mitral (valve) prolapse: Secondary | ICD-10-CM | POA: Diagnosis present

## 2020-01-13 DIAGNOSIS — T83518A Infection and inflammatory reaction due to other urinary catheter, initial encounter: Principal | ICD-10-CM | POA: Diagnosis present

## 2020-01-13 DIAGNOSIS — K5903 Drug induced constipation: Secondary | ICD-10-CM | POA: Diagnosis present

## 2020-01-13 DIAGNOSIS — N390 Urinary tract infection, site not specified: Secondary | ICD-10-CM | POA: Diagnosis present

## 2020-01-13 DIAGNOSIS — R1310 Dysphagia, unspecified: Secondary | ICD-10-CM

## 2020-01-13 DIAGNOSIS — B965 Pseudomonas (aeruginosa) (mallei) (pseudomallei) as the cause of diseases classified elsewhere: Secondary | ICD-10-CM | POA: Diagnosis present

## 2020-01-13 DIAGNOSIS — D509 Iron deficiency anemia, unspecified: Secondary | ICD-10-CM | POA: Diagnosis present

## 2020-01-13 DIAGNOSIS — Z8249 Family history of ischemic heart disease and other diseases of the circulatory system: Secondary | ICD-10-CM

## 2020-01-13 DIAGNOSIS — G8929 Other chronic pain: Secondary | ICD-10-CM | POA: Diagnosis present

## 2020-01-13 DIAGNOSIS — N319 Neuromuscular dysfunction of bladder, unspecified: Secondary | ICD-10-CM | POA: Diagnosis present

## 2020-01-13 DIAGNOSIS — G834 Cauda equina syndrome: Secondary | ICD-10-CM | POA: Diagnosis present

## 2020-01-13 DIAGNOSIS — Z79899 Other long term (current) drug therapy: Secondary | ICD-10-CM

## 2020-01-13 DIAGNOSIS — W11XXXS Fall on and from ladder, sequela: Secondary | ICD-10-CM | POA: Diagnosis present

## 2020-01-13 DIAGNOSIS — Z880 Allergy status to penicillin: Secondary | ICD-10-CM | POA: Diagnosis not present

## 2020-01-13 DIAGNOSIS — Z981 Arthrodesis status: Secondary | ICD-10-CM | POA: Diagnosis not present

## 2020-01-13 DIAGNOSIS — G4700 Insomnia, unspecified: Secondary | ICD-10-CM | POA: Diagnosis present

## 2020-01-13 DIAGNOSIS — T40605A Adverse effect of unspecified narcotics, initial encounter: Secondary | ICD-10-CM | POA: Diagnosis present

## 2020-01-13 DIAGNOSIS — R109 Unspecified abdominal pain: Secondary | ICD-10-CM

## 2020-01-13 DIAGNOSIS — I447 Left bundle-branch block, unspecified: Secondary | ICD-10-CM | POA: Diagnosis present

## 2020-01-13 DIAGNOSIS — S32011S Stable burst fracture of first lumbar vertebra, sequela: Secondary | ICD-10-CM | POA: Diagnosis not present

## 2020-01-13 DIAGNOSIS — Y846 Urinary catheterization as the cause of abnormal reaction of the patient, or of later complication, without mention of misadventure at the time of the procedure: Secondary | ICD-10-CM | POA: Diagnosis present

## 2020-01-13 DIAGNOSIS — R1084 Generalized abdominal pain: Secondary | ICD-10-CM | POA: Diagnosis not present

## 2020-01-13 DIAGNOSIS — F4321 Adjustment disorder with depressed mood: Secondary | ICD-10-CM

## 2020-01-13 DIAGNOSIS — G8220 Paraplegia, unspecified: Secondary | ICD-10-CM | POA: Diagnosis present

## 2020-01-13 DIAGNOSIS — G629 Polyneuropathy, unspecified: Secondary | ICD-10-CM | POA: Diagnosis present

## 2020-01-13 DIAGNOSIS — F329 Major depressive disorder, single episode, unspecified: Secondary | ICD-10-CM | POA: Diagnosis not present

## 2020-01-13 DIAGNOSIS — Z87440 Personal history of urinary (tract) infections: Secondary | ICD-10-CM

## 2020-01-13 DIAGNOSIS — T83511S Infection and inflammatory reaction due to indwelling urethral catheter, sequela: Secondary | ICD-10-CM | POA: Diagnosis not present

## 2020-01-13 DIAGNOSIS — S343XXA Injury of cauda equina, initial encounter: Secondary | ICD-10-CM | POA: Diagnosis present

## 2020-01-13 DIAGNOSIS — W11XXXA Fall on and from ladder, initial encounter: Secondary | ICD-10-CM

## 2020-01-13 DIAGNOSIS — R358 Other polyuria: Secondary | ICD-10-CM | POA: Diagnosis present

## 2020-01-13 DIAGNOSIS — Z800 Family history of malignant neoplasm of digestive organs: Secondary | ICD-10-CM

## 2020-01-13 LAB — CBC WITH DIFFERENTIAL/PLATELET
Abs Immature Granulocytes: 0.02 10*3/uL (ref 0.00–0.07)
Basophils Absolute: 0 10*3/uL (ref 0.0–0.1)
Basophils Relative: 0 %
Eosinophils Absolute: 0.2 10*3/uL (ref 0.0–0.5)
Eosinophils Relative: 3 %
HCT: 35.7 % — ABNORMAL LOW (ref 39.0–52.0)
Hemoglobin: 11.9 g/dL — ABNORMAL LOW (ref 13.0–17.0)
Immature Granulocytes: 0 %
Lymphocytes Relative: 9 %
Lymphs Abs: 0.6 10*3/uL — ABNORMAL LOW (ref 0.7–4.0)
MCH: 31.4 pg (ref 26.0–34.0)
MCHC: 33.3 g/dL (ref 30.0–36.0)
MCV: 94.2 fL (ref 80.0–100.0)
Monocytes Absolute: 0.4 10*3/uL (ref 0.1–1.0)
Monocytes Relative: 6 %
Neutro Abs: 5.4 10*3/uL (ref 1.7–7.7)
Neutrophils Relative %: 82 %
Platelets: 294 10*3/uL (ref 150–400)
RBC: 3.79 MIL/uL — ABNORMAL LOW (ref 4.22–5.81)
RDW: 12.7 % (ref 11.5–15.5)
WBC: 6.7 10*3/uL (ref 4.0–10.5)
nRBC: 0 % (ref 0.0–0.2)

## 2020-01-13 LAB — URINALYSIS, ROUTINE W REFLEX MICROSCOPIC
Bilirubin Urine: NEGATIVE
Glucose, UA: NEGATIVE mg/dL
Ketones, ur: 5 mg/dL — AB
Nitrite: NEGATIVE
Protein, ur: NEGATIVE mg/dL
Specific Gravity, Urine: 1.013 (ref 1.005–1.030)
pH: 8 (ref 5.0–8.0)

## 2020-01-13 LAB — COMPREHENSIVE METABOLIC PANEL
ALT: 21 U/L (ref 0–44)
AST: 18 U/L (ref 15–41)
Albumin: 3.2 g/dL — ABNORMAL LOW (ref 3.5–5.0)
Alkaline Phosphatase: 57 U/L (ref 38–126)
Anion gap: 8 (ref 5–15)
BUN: 14 mg/dL (ref 8–23)
CO2: 31 mmol/L (ref 22–32)
Calcium: 9 mg/dL (ref 8.9–10.3)
Chloride: 102 mmol/L (ref 98–111)
Creatinine, Ser: 0.78 mg/dL (ref 0.61–1.24)
GFR calc Af Amer: >60 mL/min (ref >60)
GFR calc non Af Amer: >60 mL/min (ref >60)
Glucose, Bld: 112 mg/dL — ABNORMAL HIGH (ref 70–99)
Potassium: 4 mmol/L (ref 3.5–5.1)
Sodium: 141 mmol/L (ref 135–145)
Total Bilirubin: 0.8 mg/dL (ref 0.3–1.2)
Total Protein: 5.7 g/dL — ABNORMAL LOW (ref 6.5–8.1)

## 2020-01-13 LAB — BASIC METABOLIC PANEL
Anion gap: 11 (ref 5–15)
BUN: 12 mg/dL (ref 8–23)
CO2: 29 mmol/L (ref 22–32)
Calcium: 9.2 mg/dL (ref 8.9–10.3)
Chloride: 98 mmol/L (ref 98–111)
Creatinine, Ser: 0.84 mg/dL (ref 0.61–1.24)
GFR calc Af Amer: >60 mL/min (ref >60)
GFR calc non Af Amer: >60 mL/min (ref >60)
Glucose, Bld: 137 mg/dL — ABNORMAL HIGH (ref 70–99)
Potassium: 3.8 mmol/L (ref 3.5–5.1)
Sodium: 138 mmol/L (ref 135–145)

## 2020-01-13 LAB — LIPASE, BLOOD: Lipase: 14 U/L (ref 11–51)

## 2020-01-13 LAB — SARS CORONAVIRUS 2 (TAT 6-24 HRS): SARS Coronavirus 2: NEGATIVE

## 2020-01-13 LAB — LACTIC ACID, PLASMA: Lactic Acid, Venous: 0.7 mmol/L (ref 0.5–1.9)

## 2020-01-13 MED ORDER — SODIUM CHLORIDE 0.9 % IV SOLN: Freq: Once | INTRAVENOUS | Status: AC

## 2020-01-13 MED ORDER — OXYCODONE HCL ER 10 MG PO T12A
10.0000 mg | EXTENDED_RELEASE_TABLET | Freq: Two times a day (BID) | ORAL | Status: DC
  Administered 2020-01-13 – 2020-01-19 (×12): 10 mg via ORAL
  Filled 2020-01-13 (×12): qty 1

## 2020-01-13 MED ORDER — TAMSULOSIN HCL 0.4 MG PO CAPS
0.8000 mg | ORAL_CAPSULE | Freq: Every day | ORAL | Status: DC
  Administered 2020-01-13: 23:00:00 0.4 mg via ORAL
  Administered 2020-01-14 – 2020-01-18 (×5): 0.8 mg via ORAL
  Filled 2020-01-13 (×6): qty 2

## 2020-01-13 MED ORDER — LORAZEPAM 1 MG PO TABS
1.0000 mg | ORAL_TABLET | Freq: Once | ORAL | Status: AC
  Administered 2020-01-13: 1 mg via ORAL
  Filled 2020-01-13: qty 1

## 2020-01-13 MED ORDER — DULOXETINE HCL 60 MG PO CPEP
60.0000 mg | ORAL_CAPSULE | Freq: Every day | ORAL | Status: DC
  Administered 2020-01-16 – 2020-01-19 (×4): 60 mg via ORAL
  Filled 2020-01-13 (×4): qty 1

## 2020-01-13 MED ORDER — DULOXETINE HCL 30 MG PO CPEP: 30.0000 mg | ORAL_CAPSULE | Freq: Every day | ORAL | Status: DC

## 2020-01-13 MED ORDER — SODIUM CHLORIDE 0.9 % IV SOLN: INTRAVENOUS | Status: DC

## 2020-01-13 MED ORDER — ADULT MULTIVITAMIN W/MINERALS CH
1.0000 | ORAL_TABLET | Freq: Every day | ORAL | Status: DC
  Administered 2020-01-14 – 2020-01-19 (×6): 1 via ORAL
  Filled 2020-01-13 (×6): qty 1

## 2020-01-13 MED ORDER — KETOROLAC TROMETHAMINE 15 MG/ML IJ SOLN
15.0000 mg | Freq: Once | INTRAMUSCULAR | Status: AC
  Administered 2020-01-13: 15 mg via INTRAVENOUS
  Filled 2020-01-13: qty 1

## 2020-01-13 MED ORDER — ONDANSETRON HCL 4 MG/2ML IJ SOLN
4.0000 mg | Freq: Four times a day (QID) | INTRAMUSCULAR | Status: DC | PRN
  Administered 2020-01-14 – 2020-01-16 (×3): 4 mg via INTRAVENOUS
  Filled 2020-01-13 (×3): qty 2

## 2020-01-13 MED ORDER — MULTIVITAMIN ADULT PO TABS: ORAL_TABLET | Freq: Every day | ORAL | Status: DC

## 2020-01-13 MED ORDER — TRAZODONE HCL 50 MG PO TABS
50.0000 mg | ORAL_TABLET | Freq: Once | ORAL | Status: AC
  Administered 2020-01-13: 50 mg via ORAL
  Filled 2020-01-13: qty 1

## 2020-01-13 MED ORDER — ONDANSETRON HCL 4 MG PO TABS: 4.0000 mg | ORAL_TABLET | Freq: Every day | ORAL | Status: DC | PRN

## 2020-01-13 MED ORDER — LEVOFLOXACIN IN D5W 500 MG/100ML IV SOLN
500.0000 mg | INTRAVENOUS | Status: DC
  Administered 2020-01-14 – 2020-01-16 (×3): 500 mg via INTRAVENOUS
  Filled 2020-01-13 (×2): qty 100

## 2020-01-13 MED ORDER — FERROUS SULFATE 325 (65 FE) MG PO TABS
325.0000 mg | ORAL_TABLET | Freq: Every day | ORAL | Status: DC
  Administered 2020-01-14 – 2020-01-19 (×6): 325 mg via ORAL
  Filled 2020-01-13 (×6): qty 1

## 2020-01-13 MED ORDER — FENTANYL CITRATE (PF) 100 MCG/2ML IJ SOLN
25.0000 ug | Freq: Once | INTRAMUSCULAR | Status: AC
  Administered 2020-01-13: 18:00:00 25 ug via INTRAVENOUS
  Filled 2020-01-13: qty 2

## 2020-01-13 MED ORDER — BISACODYL 10 MG RE SUPP
10.0000 mg | Freq: Every day | RECTAL | Status: DC | PRN
  Administered 2020-01-14: 15:00:00 10 mg via RECTAL
  Filled 2020-01-13: qty 1

## 2020-01-13 MED ORDER — FENTANYL CITRATE (PF) 100 MCG/2ML IJ SOLN
50.0000 ug | Freq: Once | INTRAMUSCULAR | Status: AC
  Administered 2020-01-13: 16:00:00 50 ug via INTRAVENOUS
  Filled 2020-01-13: qty 2

## 2020-01-13 MED ORDER — OXYCODONE HCL 5 MG PO TABS: 10.0000 mg | ORAL_TABLET | Freq: Two times a day (BID) | ORAL | Status: DC | PRN

## 2020-01-13 MED ORDER — OXYCODONE HCL 5 MG PO TABS
10.0000 mg | ORAL_TABLET | Freq: Four times a day (QID) | ORAL | Status: DC | PRN
  Administered 2020-01-13 – 2020-01-15 (×4): 10 mg via ORAL
  Filled 2020-01-13 (×4): qty 2

## 2020-01-13 MED ORDER — CIPROFLOXACIN IN D5W 400 MG/200ML IV SOLN
400.0000 mg | Freq: Once | INTRAVENOUS | Status: AC
  Administered 2020-01-13: 400 mg via INTRAVENOUS
  Filled 2020-01-13: qty 200

## 2020-01-13 MED ORDER — ACETAMINOPHEN ER 650 MG PO TBCR: 650.0000 mg | EXTENDED_RELEASE_TABLET | Freq: Three times a day (TID) | ORAL | Status: DC | PRN

## 2020-01-13 MED ORDER — METOCLOPRAMIDE HCL 5 MG/ML IJ SOLN
10.0000 mg | Freq: Once | INTRAMUSCULAR | Status: AC
  Administered 2020-01-13: 10 mg via INTRAVENOUS
  Filled 2020-01-13: qty 2

## 2020-01-13 MED ORDER — ENOXAPARIN SODIUM 40 MG/0.4ML ~~LOC~~ SOLN
40.0000 mg | SUBCUTANEOUS | Status: DC
  Administered 2020-01-13 – 2020-01-18 (×6): 40 mg via SUBCUTANEOUS
  Filled 2020-01-13 (×6): qty 0.4

## 2020-01-13 MED ORDER — METHOCARBAMOL 500 MG PO TABS
500.0000 mg | ORAL_TABLET | Freq: Once | ORAL | Status: AC
  Administered 2020-01-13: 500 mg via ORAL
  Filled 2020-01-13: qty 1

## 2020-01-13 MED ORDER — GABAPENTIN 300 MG PO CAPS
600.0000 mg | ORAL_CAPSULE | Freq: Three times a day (TID) | ORAL | Status: DC
  Administered 2020-01-13 – 2020-01-19 (×17): 600 mg via ORAL
  Filled 2020-01-13 (×17): qty 2

## 2020-01-13 MED ORDER — TRAZODONE HCL 50 MG PO TABS
50.0000 mg | ORAL_TABLET | Freq: Every day | ORAL | Status: DC
  Administered 2020-01-13 – 2020-01-14 (×2): 50 mg via ORAL
  Filled 2020-01-13 (×2): qty 1

## 2020-01-13 MED ORDER — GABAPENTIN 600 MG PO TABS
600.0000 mg | ORAL_TABLET | Freq: Three times a day (TID) | ORAL | Status: DC
  Filled 2020-01-13: qty 1

## 2020-01-13 MED ORDER — PROMETHAZINE HCL 25 MG RE SUPP: 25.0000 mg | Freq: Four times a day (QID) | RECTAL | Status: DC | PRN

## 2020-01-13 MED ORDER — SODIUM CHLORIDE 0.9 % IV BOLUS
1000.0000 mL | Freq: Once | INTRAVENOUS | Status: AC
  Administered 2020-01-13: 16:00:00 1000 mL via INTRAVENOUS

## 2020-01-13 MED ORDER — DULOXETINE HCL 30 MG PO CPEP
30.0000 mg | ORAL_CAPSULE | Freq: Every day | ORAL | Status: AC
  Administered 2020-01-13 – 2020-01-15 (×3): 30 mg via ORAL
  Filled 2020-01-13 (×3): qty 1

## 2020-01-13 MED ORDER — OXYCODONE HCL 5 MG PO TABS
10.0000 mg | ORAL_TABLET | Freq: Once | ORAL | Status: AC
  Administered 2020-01-13: 10 mg via ORAL
  Filled 2020-01-13: qty 2

## 2020-01-13 MED ORDER — ACETAMINOPHEN 325 MG PO TABS
650.0000 mg | ORAL_TABLET | Freq: Four times a day (QID) | ORAL | Status: DC | PRN
  Administered 2020-01-14 – 2020-01-18 (×6): 650 mg via ORAL
  Filled 2020-01-13 (×7): qty 2

## 2020-01-13 MED ORDER — ACETAMINOPHEN 650 MG RE SUPP: 650.0000 mg | Freq: Four times a day (QID) | RECTAL | Status: DC | PRN

## 2020-01-13 MED ORDER — DOCUSATE SODIUM 100 MG PO CAPS
100.0000 mg | ORAL_CAPSULE | Freq: Two times a day (BID) | ORAL | Status: DC
  Administered 2020-01-13 – 2020-01-19 (×12): 100 mg via ORAL
  Filled 2020-01-13 (×12): qty 1

## 2020-01-13 MED ORDER — CIPROFLOXACIN IN D5W 400 MG/200ML IV SOLN
400.0000 mg | Freq: Two times a day (BID) | INTRAVENOUS | Status: DC
  Administered 2020-01-13: 18:00:00 400 mg via INTRAVENOUS
  Filled 2020-01-13: qty 200

## 2020-01-13 MED ORDER — OXYCODONE ER 9 MG PO C12A: 9.0000 mg | EXTENDED_RELEASE_CAPSULE | Freq: Two times a day (BID) | ORAL | Status: DC

## 2020-01-13 MED ORDER — SENNA 8.6 MG PO TABS
1.0000 | ORAL_TABLET | Freq: Two times a day (BID) | ORAL | Status: DC
  Administered 2020-01-14 – 2020-01-19 (×9): 8.6 mg via ORAL
  Filled 2020-01-13 (×9): qty 1

## 2020-01-13 MED ORDER — PROMETHAZINE HCL 25 MG RE SUPP: 25.0000 mg | Freq: Four times a day (QID) | RECTAL | 1 refills | Status: DC | PRN

## 2020-01-13 MED ORDER — HYDROCORTISONE (PERIANAL) 2.5 % EX CREA
1.0000 "application " | TOPICAL_CREAM | Freq: Three times a day (TID) | CUTANEOUS | Status: DC | PRN
  Administered 2020-01-14: 1 via RECTAL
  Filled 2020-01-13: qty 28.35

## 2020-01-13 MED ORDER — XTAMPZA ER 9 MG PO C12A: 9.0000 mg | EXTENDED_RELEASE_CAPSULE | Freq: Two times a day (BID) | ORAL | 0 refills | Status: DC

## 2020-01-13 MED ORDER — POLYETHYLENE GLYCOL 3350 17 G PO PACK
17.0000 g | PACK | Freq: Two times a day (BID) | ORAL | Status: DC
  Administered 2020-01-14 – 2020-01-19 (×10): 17 g via ORAL
  Filled 2020-01-13 (×11): qty 1

## 2020-01-13 MED ORDER — TRAMADOL HCL 50 MG PO TABS
50.0000 mg | ORAL_TABLET | Freq: Four times a day (QID) | ORAL | Status: DC | PRN
  Administered 2020-01-14 – 2020-01-18 (×3): 50 mg via ORAL
  Filled 2020-01-13 (×3): qty 1

## 2020-01-13 MED ORDER — ONDANSETRON HCL 4 MG PO TABS: 4.0000 mg | ORAL_TABLET | Freq: Four times a day (QID) | ORAL | Status: DC | PRN

## 2020-01-13 MED ORDER — TAMSULOSIN HCL 0.4 MG PO CAPS
0.4000 mg | ORAL_CAPSULE | Freq: Once | ORAL | Status: AC
  Administered 2020-01-13: 0.4 mg via ORAL
  Filled 2020-01-13: qty 1

## 2020-01-13 MED ORDER — METHOCARBAMOL 500 MG PO TABS
500.0000 mg | ORAL_TABLET | Freq: Four times a day (QID) | ORAL | Status: DC
  Administered 2020-01-13 – 2020-01-19 (×20): 500 mg via ORAL
  Filled 2020-01-13 (×20): qty 1

## 2020-01-13 NOTE — ED Notes (Addendum)
Patient transported to CT 

## 2020-01-13 NOTE — ED Notes (Signed)
Patient was verbalized discharge instructions. Pt had no further questions at this time. NAD. 

## 2020-01-13 NOTE — ED Triage Notes (Signed)
Per [facility] pt from home for n/v and pain with foley catheter. Reports was seen here last night for UTI and catheter was placed. Pt unable to take medications or have any PO intake due to the n/v.  122/74, 70HR, 18R, 96% on RA. 979.

## 2020-01-13 NOTE — Telephone Encounter (Signed)
Sent in Phenergan suppository so can take rectally if needed. Think best course of action is ER at this time. And he's there currently.

## 2020-01-13 NOTE — ED Provider Notes (Signed)
[facility]-EMERGENCY DEPT Provider Note   CSN: [id number] Arrival date & time: 01/13/20  1436     History Chief Complaint  Patient presents with   Emesis   Nausea    Louis Jordan is a 64 y.o. male.  Presents to the emergency department with a chief complaint nausea vomiting.  Patient reports that he had fever yesterday and again this morning.  States yesterday he was diagnosed with UTI by his urologist and started on ciprofloxacin.  Came to the ER with concern for nausea and vomiting.  Received medicines in the ER and was discharged home.  Today continues to have nausea and vomiting.  Cannot recall total number of episodes of vomiting.  Last bowel movement 1 or 2 days ago, no constipation, no diarrhea, still passing gas.  No associated abdominal pain.  States he has chronic back pain associated with his trauma in October.  Indwelling Foley, managed by urology.  HPI     Past Medical History:  Diagnosis Date   Bundle branch block, right    Complication of anesthesia    Hand pain    mild OA bilateral  hands   Left knee injury 1970   requiring surgery for repair of ligaments/cartilage damage with nerve injury that took 20 years to resolve   MVP (mitral valve prolapse)    PONV (postoperative nausea and vomiting)     Patient Active Problem List   Diagnosis Date Noted   UTI (urinary tract infection) 01/13/2020   Encounter for therapeutic drug monitoring 01/09/2020   Nausea & vomiting 11/21/2019   Ileus (HCC) 11/21/2019   Hypoalbuminemia due to protein-calorie malnutrition (HCC)    Acute blood loss anemia    Postoperative pain    Neuropathic pain    Neurogenic bladder 10/26/2019   Neurogenic bowel 10/26/2019   Spinal cord injury, 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   Shoulder dislocation, left, initial encounter    Closed burst fracture of lumbar vertebra (HCC)    Cauda equina spinal cord injury (HCC)    Closed fracture of  left ankle    Fall from ladder 10/22/2019   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   Closed displaced trimalleolar fracture of left ankle 10/22/2019   S/P lumbar fusion 10/16/2019    Past Surgical History:  Procedure Laterality Date   APPENDECTOMY     KNEE SURGERY Right    LUMBAR SPINE SURGERY  10/16/2019   L1 burst fracture with epidural hematoma and canal stenosis   OPEN REDUCTION INTERNAL FIXATION (ORIF) TIBIA/FIBULA FRACTURE Left 10/19/2019   Procedure: OPEN REDUCTION INTERNAL FIXATION (ORIF) TIBIA/FIBULA FRACTURE;  Surgeon: Roby LoftsHaddix, Kevin P, MD;  Location: [facility] OR;  Service: Orthopedics;  Laterality: Left;   SHOULDER ARTHROSCOPY WITH BANKART REPAIR Left 10/19/2019   Procedure: SHOULDER ARTHROSCOPY WITH BANKART REPAIR, EXTENSIVE DEBRIDEMENT, LOOSE BODY REMOVAL;  Surgeon: Bjorn PippinVarkey, Dax T, MD;  Location: [facility] OR;  Service: Orthopedics;  Laterality: Left;   STRABISMUS SURGERY     TONSILLECTOMY       Family History  Problem Relation Age of Onset   Heart disease Mother    Alzheimer's disease Mother    Pancreatic cancer Father    High blood pressure Brother     Social History   Tobacco Use   Smoking status: Never Smoker   Smokeless tobacco: Never Used  Substance Use Topics   Alcohol use: Yes    Comment: RARE   Drug use: Never    Home Medications Prior to Admission medications  Medication Sig Start Date End Date Taking? Authorizing Provider  acetaminophen (TYLENOL) 650 MG CR tablet Take 650 mg by mouth every 8 (eight) hours as needed for pain.   Yes [provider]  bisacodyl (DULCOLAX) 10 MG suppository Place 1 suppository (10 mg total) rectally daily after supper. Patient taking differently: Place 10 mg rectally daily as needed for moderate constipation.  11/08/19  Yes Love, Evlyn Kanner, PA-C  ciprofloxacin (CIPRO) 250 MG tablet Take 250 mg by mouth 2 (two) times daily. 01/12/20  Yes [provider]  docusate sodium (COLACE) 100 MG  capsule Take 1 capsule (100 mg total) by mouth 2 (two) times daily. 11/08/19  Yes Love, Evlyn Kanner, PA-C  DULoxetine (CYMBALTA) 30 MG capsule Take 1 capsule (30 mg total) by mouth daily. Take 1 tab  daily x 1 week than 2 tabs (60 mg) daily -for nerve pain Patient taking differently: Take 30-60 mg by mouth daily. Take 1 tab  daily x 1 week than 2 tabs (60 mg) daily -for nerve pain 01/09/20  Yes Lovorn, Aundra Millet, MD  ferrous sulfate 325 (65 FE) MG tablet Take 1 tablet (325 mg total) by mouth daily with breakfast. 11/08/19  Yes Love, Evlyn Kanner, PA-C  gabapentin (NEURONTIN) 600 MG tablet TAKE 1 TABLET(600 MG) BY MOUTH THREE TIMES DAILY Patient taking differently: Take 600 mg by mouth 3 (three) times daily.  12/12/19  Yes Raulkar, Drema Pry, MD  hydrocortisone (ANUSOL-HC) 2.5 % rectal cream Place rectally 3 (three) times daily. Patient taking differently: Place 1 application rectally 3 (three) times daily as needed for hemorrhoids.  11/08/19  Yes Love, Evlyn Kanner, PA-C  methocarbamol (ROBAXIN) 500 MG tablet Take 1 tablet (500 mg total) by mouth 4 (four) times daily. 11/28/19  Yes Lovorn, Aundra Millet, MD  Multiple Vitamins-Minerals (MULTIVITAMIN ADULT PO) Take 1 tablet by mouth daily.   Yes [provider]  ondansetron (ZOFRAN) 4 MG tablet Take 1 tablet (4 mg total) by mouth daily as needed for nausea or vomiting. 01/09/20 01/08/21 Yes Lovorn, Aundra Millet, MD  oxyCODONE (ROXICODONE) 5 MG immediate release tablet Take 2 tablets (10 mg total) by mouth 2 (two) times daily as needed for severe pain or breakthrough pain. 01/09/20  Yes Lovorn, Aundra Millet, MD  oxyCODONE ER (XTAMPZA ER) 9 MG C12A Take 9 mg by mouth 2 (two) times daily. 11/28/19  Yes Lovorn, Aundra Millet, MD  polyethylene glycol (MIRALAX / GLYCOLAX) 17 g packet Take 17 g by mouth 2 (two) times daily. 11/08/19  Yes Love, Evlyn Kanner, PA-C  promethazine (PHENERGAN) 25 MG suppository Place 1 suppository (25 mg total) rectally every 6 (six) hours as needed for nausea or vomiting.  01/13/20  Yes Lovorn, Aundra Millet, MD  senna (SENOKOT) 8.6 MG TABS tablet Take 1 tablet (8.6 mg total) by mouth 2 (two) times daily. 11/08/19  Yes Love, Evlyn Kanner, PA-C  tamsulosin (FLOMAX) 0.4 MG CAPS capsule Take 2 capsules (0.8 mg total) by mouth daily after supper. 11/08/19  Yes Love, Evlyn Kanner, PA-C  traMADol (ULTRAM) 50 MG tablet Take 1 tablet (50 mg total) by mouth every 6 (six) hours as needed for moderate pain. 11/28/19  Yes Lovorn, Aundra Millet, MD  traZODone (DESYREL) 50 MG tablet Take 1 tablet (50 mg total) by mouth at bedtime. 01/13/20  Yes Lovorn, Aundra Millet, MD  acetaminophen (TYLENOL) 325 MG tablet Take 2 tablets (650 mg total) by mouth every 6 (six) hours as needed. Patient not taking: Reported on 01/13/2020 11/08/19   Jacquelynn Cree, PA-C  docusate sodium (  ENEMEEZ) 283 MG enema Place 1 enema (283 mg total) rectally daily. Patient not taking: Reported on 01/13/2020 12/02/19   Izora Ribas, MD  enoxaparin (LOVENOX) 40 MG/0.4ML injection Inject 0.4 mLs (40 mg total) into the skin daily. Patient not taking: Reported on 01/13/2020 11/28/19   Lovorn, Jinny Blossom, MD  lidocaine (XYLOCAINE) 2 % jelly Place 1 application into the urethra as needed (cathing). 11/08/19   Love, Ivan Anchors, PA-C  oxyCODONE ER (XTAMPZA ER) 9 MG C12A Take 9 mg by mouth 2 (two) times daily. 01/13/20   Lovorn, Jinny Blossom, MD  traZODone (DESYREL) 50 MG tablet Take 1 tablet (50 mg total) by mouth at bedtime. Patient not taking: Reported on 01/13/2020 11/28/19   Courtney Heys, MD    Allergies    Penicillins  Review of Systems   Review of Systems  Constitutional: Negative for chills and fever.  HENT: Negative for ear pain and sore throat.   Eyes: Negative for pain and visual disturbance.  Respiratory: Negative for cough and shortness of breath.   Cardiovascular: Negative for chest pain and palpitations.  Gastrointestinal: Positive for nausea and vomiting. Negative for abdominal pain.  Genitourinary: Negative for dysuria and hematuria.    Musculoskeletal: Negative for arthralgias and back pain.  Skin: Negative for color change and rash.  Neurological: Negative for seizures and syncope.  All other systems reviewed and are negative.   Physical Exam Updated Vital Signs BP 134/76 (BP Location: Right Arm)    Pulse 61    Temp 98.3 F (36.8 C) (Oral)    Resp 16    SpO2 98%   Physical Exam Vitals and nursing note reviewed.  Constitutional:      General: He is not in acute distress.    Appearance: He is well-developed.  HENT:     Head: Normocephalic and atraumatic.  Eyes:     Conjunctiva/sclera: Conjunctivae normal.  Cardiovascular:     Rate and Rhythm: Normal rate and regular rhythm.     Heart sounds: No murmur.  Pulmonary:     Effort: Pulmonary effort is normal. No respiratory distress.     Breath sounds: Normal breath sounds.  Abdominal:     Palpations: Abdomen is soft.     Tenderness: There is no abdominal tenderness.  Musculoskeletal:        General: No signs of injury.     Cervical back: Neck supple.     Right lower leg: No edema.     Left lower leg: No edema.  Skin:    General: Skin is warm and dry.     Capillary Refill: Capillary refill takes less than 2 seconds.  Neurological:     Mental Status: He is alert and oriented to person, place, and time. Mental status is at baseline.     ED Results / Procedures / Treatments   Labs (all labs ordered are listed, but only abnormal results are displayed) Labs Reviewed  CBC WITH DIFFERENTIAL/PLATELET - Abnormal; Notable for the following components:      Result Value   RBC 3.79 (*)    Hemoglobin 11.9 (*)    HCT 35.7 (*)    Lymphs Abs 0.6 (*)    All other components within normal limits  COMPREHENSIVE METABOLIC PANEL - Abnormal; Notable for the following components:   Glucose, Bld 112 (*)    Total Protein 5.7 (*)    Albumin 3.2 (*)    All other components within normal limits  CULTURE, BLOOD (ROUTINE X 2)  CULTURE, BLOOD (  ROUTINE X 2)  LIPASE, BLOOD   COMPREHENSIVE METABOLIC PANEL  CBC    EKG None  Radiology CT ABDOMEN PELVIS WO CONTRAST  Result Date: 01/13/2020 CLINICAL DATA:  Right lower quadrant abdominal pain with palpable bulge, question hernia EXAM: CT ABDOMEN AND PELVIS WITHOUT CONTRAST TECHNIQUE: Multidetector CT imaging of the abdomen and pelvis was performed following the standard protocol without IV contrast. COMPARISON:  CT 11/20/2019 FINDINGS: Lower chest: Atelectatic changes noted in the lung bases. Normal heart size. No pericardial effusion. Hepatobiliary: No focal liver abnormality is seen. No gallstones, gallbladder wall thickening, or biliary dilatation. Pancreas: Unremarkable. No pancreatic ductal dilatation or surrounding inflammatory changes. Spleen: Normal in size without focal abnormality. Adrenals/Urinary Tract: Normal adrenal glands. No visible or contour deforming renal lesions. No urolithiasis or hydronephrosis. Urinary bladder is decompressed about an inflated Foley catheter with small volume high attenuation material in the bladder lumen. There is circumferential bladder wall thickening and luminal gas, nonspecific in the setting of instrumentation. Stomach/Bowel: Distal esophagus, stomach and duodenal sweep are unremarkable. No small bowel wall thickening or dilatation. No evidence of obstruction. The appendix is surgically absent. Moderate stool burden throughout the colon. No colonic thickening or dilatation. Vascular/Lymphatic: Atherosclerotic plaque within the normal caliber aorta. No suspicious or enlarged lymph nodes in the included lymphatic chains. Reproductive: The prostate and seminal vesicles are unremarkable. Other: No free fluid. No free air. Bowel or fat containing hernia seen in the right lower quadrant. No soft tissue abnormality, fluid collection or other possible CT explanation for palpable abnormality. Musculoskeletal: Multilevel degenerative changes are present in the imaged portions of the spine. Long  segment thoracolumbar fusion with transpedicular screws at T10, T11 and T12 as well as L2, L3 and L4. Fusion levels bridge a a multipart burst fracture deformity of L1 with approximately 60% height loss which is similar in appearance to comparison CT from 11/20/2019. No acute osseous abnormality or suspicious osseous lesion. IMPRESSION: 1. No visible hernia, lesion, fluid collection, or other possible CT explanation for palpable abnormality in the right lower quadrant. 2. No other CT explanation patient's symptoms. 3. Circumferential bladder wall thickening and luminal gas, nonspecific in the setting of instrumentation. Correlate with urinalysis to exclude cystitis. 4. Moderate stool burden throughout the colon. Correlate for constipation. 5. Stable postsurgical changes from prior thoracolumbar fusion spanning a an L1 burst fracture. No acute osseous abnormalities. Electronically Signed   By: Kreg Shropshire M.D.   On: 01/13/2020 01:19    Procedures Procedures (including critical care time)  Medications Ordered in ED Medications  oxyCODONE (Oxy IR/ROXICODONE) immediate release tablet 10 mg (10 mg Oral Given 01/13/20 2039)  bisacodyl (DULCOLAX) suppository 10 mg (has no administration in time range)  ferrous sulfate tablet 325 mg (has no administration in time range)  hydrocortisone (ANUSOL-HC) 2.5 % rectal cream 1 application (has no administration in time range)  polyethylene glycol (MIRALAX / GLYCOLAX) packet 17 g (17 g Oral Not Given 01/13/20 2234)  tamsulosin (FLOMAX) capsule 0.8 mg (0.4 mg Oral Given 01/13/20 2302)  docusate sodium (COLACE) capsule 100 mg (100 mg Oral Given 01/13/20 2301)  senna (SENOKOT) tablet 8.6 mg (8.6 mg Oral Not Given 01/13/20 2234)  methocarbamol (ROBAXIN) tablet 500 mg (500 mg Oral Given 01/13/20 2301)  traMADol (ULTRAM) tablet 50 mg (has no administration in time range)  oxyCODONE (Oxy IR/ROXICODONE) immediate release tablet 10 mg (has no administration in time range)    traZODone (DESYREL) tablet 50 mg (50 mg Oral Given 01/13/20 2303)  promethazine (PHENERGAN)  suppository 25 mg (has no administration in time range)  levofloxacin (LEVAQUIN) IVPB 500 mg (has no administration in time range)  enoxaparin (LOVENOX) injection 40 mg (40 mg Subcutaneous Given 01/13/20 2311)  0.9 %  sodium chloride infusion ( Intravenous New Bag/Given 01/13/20 2036)  ondansetron (ZOFRAN) tablet 4 mg (has no administration in time range)    Or  ondansetron (ZOFRAN) injection 4 mg (has no administration in time range)  acetaminophen (TYLENOL) tablet 650 mg (has no administration in time range)    Or  acetaminophen (TYLENOL) suppository 650 mg (has no administration in time range)  multivitamin with minerals tablet 1 tablet (has no administration in time range)  gabapentin (NEURONTIN) capsule 600 mg (600 mg Oral Given 01/13/20 2303)  oxyCODONE (OXYCONTIN) 12 hr tablet 10 mg (10 mg Oral Given 01/13/20 2303)  DULoxetine (CYMBALTA) DR capsule 30 mg (30 mg Oral Given 01/13/20 2304)  DULoxetine (CYMBALTA) DR capsule 60 mg (has no administration in time range)  sodium chloride 0.9 % bolus 1,000 mL (0 mLs Intravenous Stopped 01/13/20 1723)  metoCLOPramide (REGLAN) injection 10 mg (10 mg Intravenous Given 01/13/20 1623)  fentaNYL (SUBLIMAZE) injection 50 mcg (50 mcg Intravenous Given 01/13/20 1623)  0.9 %  sodium chloride infusion ( Intravenous New Bag/Given (Non-Interop) 01/13/20 1757)  fentaNYL (SUBLIMAZE) injection 25 mcg (25 mcg Intravenous Given 01/13/20 1756)  LORazepam (ATIVAN) tablet 1 mg (1 mg Oral Given 01/13/20 1808)    ED Course  I have reviewed the triage vital signs and the nursing notes.  Pertinent labs & imaging results that were available during my care of the patient were reviewed by me and considered in my medical decision making (see chart for details).    MDM Rules/Calculators/A&P                      64 year old male indwelling Foley catheter recently diagnosed with UTI  presented to ER with persistent fever, nausea and vomiting.  On exam patient was well-appearing, labs were reassuring.  However given patient's reported persistent fever and intractable vomiting, believe he would benefit from IV antibiotics, IV rehydration and admission at this time.  Discussed with Dr. Mikeal Hawthorne, hospitalist who will admit.  Final Clinical Impression(s) / ED Diagnoses Final diagnoses:  Complicated UTI (urinary tract infection)    Rx / DC Orders ED Discharge Orders    None       Milagros Loll, MD 01/14/20 [phone]

## 2020-01-13 NOTE — ED Notes (Signed)
Dark green & lavender bld tubes sent to lab. [employer]

## 2020-01-13 NOTE — H&P (Signed)
History and Physical   Louis ShutterGregory Pates Kinkade ZOX:[id number] DOB: 05/08/1956 DOA: 01/13/2020  Referring MD/NP/PA: Dr. Stevie Kernykstra  PCP: Elijio MilesWeaver, John W., MD   Outpatient Specialists: None  Patient coming from: Home  Chief Complaint: Persistent nausea and vomit  HPI: Louis Jordan is a 64 y.o. 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  Suspected UTI.  Initiated on Cipro due to severe penicillin allergy.  He was seen by urologist prior to coming in yesterday who started him on ciprofloxacin.  He was treated symptomatically for his nausea with vomiting and he did better and went home.  Patient came back to the ER today with worsening symptoms.  He is unable to keep anything down including his antibiotics.  He is also felt weak and dehydrated.  Patient is being admitted to the hospital for treatment of UTI with corresponding nausea and vomiting..  ED Course: Temperature 100.9 blood pressure 107/65 pulse 84 respirate 23 oxygen sat 92% on room air.  Chemistry mostly within normal CBC also within normal except hemoglobin of 11.9.  CT abdomen pelvis showed no visible hernia moderate stool burden otherwise stable postsurgical changes from prior thoracolumbar fusion.  Blood cultures and urine cultures obtained and patient is being admitted with intractable nausea vomiting most likely from UTI.  Review of Systems: As per HPI otherwise 10 point review of systems negative.    Past Medical History:  Diagnosis Date  . Bundle branch block, right   . Complication of anesthesia   . Hand pain    mild OA bilateral  hands  . Left knee injury 1970   requiring surgery for repair of ligaments/cartilage damage with nerve injury that took 20 years to resolve  . MVP (mitral valve prolapse)   . PONV (postoperative nausea and vomiting)     Past  Surgical History:  Procedure Laterality Date  . APPENDECTOMY    . KNEE SURGERY Right   . LUMBAR SPINE SURGERY  10/16/2019   L1 burst fracture with epidural hematoma and canal stenosis  . OPEN REDUCTION INTERNAL FIXATION (ORIF) TIBIA/FIBULA FRACTURE Left 10/19/2019   Procedure: OPEN REDUCTION INTERNAL FIXATION (ORIF) TIBIA/FIBULA FRACTURE;  Surgeon: Roby LoftsHaddix, Kevin P, MD;  Location: [facility] OR;  Service: Orthopedics;  Laterality: Left;  . SHOULDER ARTHROSCOPY WITH BANKART REPAIR Left 10/19/2019   Procedure: SHOULDER ARTHROSCOPY WITH BANKART REPAIR, EXTENSIVE DEBRIDEMENT, LOOSE BODY REMOVAL;  Surgeon: Bjorn PippinVarkey, Dax T, MD;  Location: [facility] OR;  Service: Orthopedics;  Laterality: Left;  . STRABISMUS SURGERY    . TONSILLECTOMY       reports that he has never smoked. He has never used smokeless tobacco. He reports current alcohol use. He reports that he does not use drugs.  Allergies  Allergen Reactions  . Penicillins Swelling    Did it involve swelling of the face/tongue/throat, SOB, or low BP? Y Did it involve sudden or severe rash/hives, skin peeling, or any reaction on the inside of your mouth or nose? Y Did you need to seek medical attention at a hospital or doctor's office? N When did it last happen?Decades ago. If all above answers are "NO", may proceed with cephalosporin use.     Family History  Problem Relation Age of Onset  . Heart disease Mother   . Alzheimer's disease Mother   . Pancreatic cancer Father   . High blood pressure Brother  Prior to Admission medications   Medication Sig Start Date End Date Taking? Authorizing Provider  acetaminophen (TYLENOL) 650 MG CR tablet Take 650 mg by mouth every 8 (eight) hours as needed for pain.   Yes [provider]  bisacodyl (DULCOLAX) 10 MG suppository Place 1 suppository (10 mg total) rectally daily after supper. Patient taking differently: Place 10 mg rectally daily as needed for moderate constipation.  11/08/19  Yes Love,  Evlyn KannerPamela S, PA-C  ciprofloxacin (CIPRO) 250 MG tablet Take 250 mg by mouth 2 (two) times daily. 01/12/20  Yes [provider]  docusate sodium (COLACE) 100 MG capsule Take 1 capsule (100 mg total) by mouth 2 (two) times daily. 11/08/19  Yes Love, Evlyn KannerPamela S, PA-C  DULoxetine (CYMBALTA) 30 MG capsule Take 1 capsule (30 mg total) by mouth daily. Take 1 tab  daily x 1 week than 2 tabs (60 mg) daily -for nerve pain Patient taking differently: Take 30-60 mg by mouth daily. Take 1 tab  daily x 1 week than 2 tabs (60 mg) daily -for nerve pain 01/09/20  Yes Lovorn, Aundra MilletMegan, MD  ferrous sulfate 325 (65 FE) MG tablet Take 1 tablet (325 mg total) by mouth daily with breakfast. 11/08/19  Yes Love, Evlyn KannerPamela S, PA-C  gabapentin (NEURONTIN) 600 MG tablet TAKE 1 TABLET(600 MG) BY MOUTH THREE TIMES DAILY Patient taking differently: Take 600 mg by mouth 3 (three) times daily.  12/12/19  Yes Raulkar, Drema PryKrutika P, MD  hydrocortisone (ANUSOL-HC) 2.5 % rectal cream Place rectally 3 (three) times daily. Patient taking differently: Place 1 application rectally 3 (three) times daily as needed for hemorrhoids.  11/08/19  Yes Love, Evlyn KannerPamela S, PA-C  methocarbamol (ROBAXIN) 500 MG tablet Take 1 tablet (500 mg total) by mouth 4 (four) times daily. 11/28/19  Yes Lovorn, Aundra MilletMegan, MD  Multiple Vitamins-Minerals (MULTIVITAMIN ADULT PO) Take 1 tablet by mouth daily.   Yes [provider]  ondansetron (ZOFRAN) 4 MG tablet Take 1 tablet (4 mg total) by mouth daily as needed for nausea or vomiting. 01/09/20 01/08/21 Yes Lovorn, Aundra MilletMegan, MD  oxyCODONE (ROXICODONE) 5 MG immediate release tablet Take 2 tablets (10 mg total) by mouth 2 (two) times daily as needed for severe pain or breakthrough pain. 01/09/20  Yes Lovorn, Aundra MilletMegan, MD  oxyCODONE ER (XTAMPZA ER) 9 MG C12A Take 9 mg by mouth 2 (two) times daily. 11/28/19  Yes Lovorn, Aundra MilletMegan, MD  polyethylene glycol (MIRALAX / GLYCOLAX) 17 g packet Take 17 g by mouth 2 (two) times daily. 11/08/19  Yes  Love, Evlyn Kanneramela S, PA-C  promethazine (PHENERGAN) 25 MG suppository Place 1 suppository (25 mg total) rectally every 6 (six) hours as needed for nausea or vomiting. 01/13/20  Yes Lovorn, Aundra MilletMegan, MD  senna (SENOKOT) 8.6 MG TABS tablet Take 1 tablet (8.6 mg total) by mouth 2 (two) times daily. 11/08/19  Yes Love, Evlyn KannerPamela S, PA-C  tamsulosin (FLOMAX) 0.4 MG CAPS capsule Take 2 capsules (0.8 mg total) by mouth daily after supper. 11/08/19  Yes Love, Evlyn KannerPamela S, PA-C  traMADol (ULTRAM) 50 MG tablet Take 1 tablet (50 mg total) by mouth every 6 (six) hours as needed for moderate pain. 11/28/19  Yes Lovorn, Aundra MilletMegan, MD  traZODone (DESYREL) 50 MG tablet Take 1 tablet (50 mg total) by mouth at bedtime. 01/13/20  Yes Lovorn, Aundra MilletMegan, MD  acetaminophen (TYLENOL) 325 MG tablet Take 2 tablets (650 mg total) by mouth every 6 (six) hours as needed. Patient not taking: Reported on 01/13/2020 11/08/19   Love,  Ivan Anchors, PA-C  docusate sodium (ENEMEEZ) 283 MG enema Place 1 enema (283 mg total) rectally daily. Patient not taking: Reported on 01/13/2020 12/02/19   Izora Ribas, MD  enoxaparin (LOVENOX) 40 MG/0.4ML injection Inject 0.4 mLs (40 mg total) into the skin daily. Patient not taking: Reported on 01/13/2020 11/28/19   Lovorn, Jinny Blossom, MD  lidocaine (XYLOCAINE) 2 % jelly Place 1 application into the urethra as needed (cathing). 11/08/19   Love, Ivan Anchors, PA-C  oxyCODONE ER (XTAMPZA ER) 9 MG C12A Take 9 mg by mouth 2 (two) times daily. 01/13/20   Lovorn, Jinny Blossom, MD  traZODone (DESYREL) 50 MG tablet Take 1 tablet (50 mg total) by mouth at bedtime. Patient not taking: Reported on 01/13/2020 11/28/19   Courtney Heys, MD    Physical Exam: Vitals:   01/13/20 1618 01/13/20 1631 01/13/20 1700 01/13/20 2033  BP: 120/65 117/73 135/76 134/76  Pulse: 66 60 64 61  Resp:  17  16  Temp:    98.3 F (36.8 C)  TempSrc:    Oral  SpO2: 95% 90% 96% 98%      Constitutional: Patient laying in bed, stable no acute distress Vitals:    01/13/20 1618 01/13/20 1631 01/13/20 1700 01/13/20 2033  BP: 120/65 117/73 135/76 134/76  Pulse: 66 60 64 61  Resp:  17  16  Temp:    98.3 F (36.8 C)  TempSrc:    Oral  SpO2: 95% 90% 96% 98%   Eyes: PERRL, lids and conjunctivae normal ENMT: Mucous membranes are dry. Posterior pharynx clear of any exudate or lesions.Normal dentition.  Neck: normal, supple, no masses, no thyromegaly Respiratory: clear to auscultation bilaterally, no wheezing, no crackles. Normal respiratory effort. No accessory muscle use.  Cardiovascular: Regular rate and rhythm, no murmurs / rubs / gallops. No extremity edema. 2+ pedal pulses. No carotid bruits.  Abdomen: no tenderness, no masses palpated. No hepatosplenomegaly. Bowel sounds positive.  Musculoskeletal: no clubbing / cyanosis. No joint deformity upper and lower extremities. Good ROM, no contractures. Normal muscle tone.  Paraplegia Skin: no rashes, lesions, ulcers. No induration Neurologic: CN 2-12 grossly intact. Sensation intact, DTR normal. Strength 5/5 in all 4.  Psychiatric: Normal judgment and insight. Alert and oriented x 3. Normal mood.     Labs on Admission: I have personally reviewed following labs and imaging studies  CBC: Recent Labs  Lab 01/12/20 2317 01/13/20 1540  WBC 9.9 6.7  NEUTROABS 9.4* 5.4  HGB 12.7* 11.9*  HCT 38.7* 35.7*  MCV 94.2 94.2  PLT 335 540   Basic Metabolic Panel: Recent Labs  Lab 01/12/20 2317 01/13/20 1540  NA 138 141  K 3.8 4.0  CL 98 102  CO2 29 31  GLUCOSE 137* 112*  BUN 12 14  CREATININE 0.84 0.78  CALCIUM 9.2 9.0   GFR: Estimated Creatinine Clearance: 86.2 mL/min (by C-G formula based on SCr of 0.78 mg/dL). Liver Function Tests: Recent Labs  Lab 01/13/20 1540  AST 18  ALT 21  ALKPHOS 57  BILITOT 0.8  PROT 5.7*  ALBUMIN 3.2*   Recent Labs  Lab 01/13/20 1540  LIPASE 14   No results for input(s): AMMONIA in the last 168 hours. Coagulation Profile: No results for input(s): INR,  PROTIME in the last 168 hours. Cardiac Enzymes: No results for input(s): CKTOTAL, CKMB, CKMBINDEX, TROPONINI in the last 168 hours. BNP (last 3 results) No results for input(s): PROBNP in the last 8760 hours. HbA1C: No results for input(s): HGBA1C in the  last 72 hours. CBG: No results for input(s): GLUCAP in the last 168 hours. Lipid Profile: No results for input(s): CHOL, HDL, LDLCALC, TRIG, CHOLHDL, LDLDIRECT in the last 72 hours. Thyroid Function Tests: No results for input(s): TSH, T4TOTAL, FREET4, T3FREE, THYROIDAB in the last 72 hours. Anemia Panel: No results for input(s): VITAMINB12, FOLATE, FERRITIN, TIBC, IRON, RETICCTPCT in the last 72 hours. Urine analysis:    Component Value Date/Time   COLORURINE YELLOW 01/12/2020 2317   APPEARANCEUR TURBID (A) 01/12/2020 2317   LABSPEC 1.013 01/12/2020 2317   PHURINE 8.0 01/12/2020 2317   GLUCOSEU NEGATIVE 01/12/2020 2317   HGBUR SMALL (A) 01/12/2020 2317   BILIRUBINUR NEGATIVE 01/12/2020 2317   KETONESUR 5 (A) 01/12/2020 2317   PROTEINUR NEGATIVE 01/12/2020 2317   NITRITE NEGATIVE 01/12/2020 2317   LEUKOCYTESUR LARGE (A) 01/12/2020 2317   Sepsis Labs: @LABRCNTIP (procalcitonin:4,lacticidven:4) ) Recent Results (from the past 240 hour(s))  Urine culture     Status: None (Preliminary result)   Collection Time: 01/12/20 11:17 PM   Specimen: Urine, Clean Catch  Result Value Ref Range Status   Specimen Description   Final    URINE, CLEAN CATCH Performed at [facility], [address] [place], [place]    Special Requests   Final    NONE Performed at [facility], [address] [place], [place]    Culture   Final    CULTURE REINCUBATED FOR BETTER GROWTH Performed at [facility], [address] [place], [place]    Report Status PENDING  Incomplete  SARS CORONAVIRUS 2 (TAT 6-24 HRS) Nasopharyngeal Nasopharyngeal Swab     Status: None   Collection  Time: 01/12/20 11:18 PM   Specimen: Nasopharyngeal Swab  Result Value Ref Range Status   SARS Coronavirus 2 NEGATIVE NEGATIVE Final    Comment: (NOTE) SARS-CoV-2 target nucleic acids are NOT DETECTED. The SARS-CoV-2 RNA is generally detectable in upper and lower respiratory specimens during the acute phase of infection. Negative results do not preclude SARS-CoV-2 infection, do not rule out co-infections with other pathogens, and should not be used as the sole basis for treatment or other patient management decisions. Negative results must be combined with clinical observations, patient history, and epidemiological information. The expected result is Negative. Fact Sheet for Patients: 01/14/20 Fact Sheet for Healthcare Providers: HairSlick.no This test is not yet approved or cleared by the quierodirigir.com FDA and  has been authorized for detection and/or diagnosis of SARS-CoV-2 by FDA under an Emergency Use Authorization (EUA). This EUA will remain  in effect (meaning this test can be used) for the duration of the COVID-19 declaration under Section 56 4(b)(1) of the Act, 21 U.S.C. section 360bbb-3(b)(1), unless the authorization is terminated or revoked sooner. Performed at [facility], [address], [place], [place]   Blood culture (routine x 2)     Status: None (Preliminary result)   Collection Time: 01/12/20 11:22 PM   Specimen: BLOOD  Result Value Ref Range Status   Specimen Description   Final    BLOOD BLOOD LEFT FOREARM Performed at [facility], [address], [place], [place]    Special Requests   Final    BOTTLES DRAWN AEROBIC AND ANAEROBIC Blood Culture results may not be optimal due to an excessive volume of blood received in culture bottles Performed at [facility], [address] [place], [place]    Culture  Final    NO  GROWTH < 12 HOURS Performed at [facility], [address], [place], [place] [address]    Report Status PENDING  Incomplete     Radiological Exams on Admission: CT ABDOMEN PELVIS WO CONTRAST  Result Date: 01/13/2020 CLINICAL DATA:  Right lower quadrant abdominal pain with palpable bulge, question hernia EXAM: CT ABDOMEN AND PELVIS WITHOUT CONTRAST TECHNIQUE: Multidetector CT imaging of the abdomen and pelvis was performed following the standard protocol without IV contrast. COMPARISON:  CT 11/20/2019 FINDINGS: Lower chest: Atelectatic changes noted in the lung bases. Normal heart size. No pericardial effusion. Hepatobiliary: No focal liver abnormality is seen. No gallstones, gallbladder wall thickening, or biliary dilatation. Pancreas: Unremarkable. No pancreatic ductal dilatation or surrounding inflammatory changes. Spleen: Normal in size without focal abnormality. Adrenals/Urinary Tract: Normal adrenal glands. No visible or contour deforming renal lesions. No urolithiasis or hydronephrosis. Urinary bladder is decompressed about an inflated Foley catheter with small volume high attenuation material in the bladder lumen. There is circumferential bladder wall thickening and luminal gas, nonspecific in the setting of instrumentation. Stomach/Bowel: Distal esophagus, stomach and duodenal sweep are unremarkable. No small bowel wall thickening or dilatation. No evidence of obstruction. The appendix is surgically absent. Moderate stool burden throughout the colon. No colonic thickening or dilatation. Vascular/Lymphatic: Atherosclerotic plaque within the normal caliber aorta. No suspicious or enlarged lymph nodes in the included lymphatic chains. Reproductive: The prostate and seminal vesicles are unremarkable. Other: No free fluid. No free air. Bowel or fat containing hernia seen in the right lower quadrant. No soft tissue abnormality, fluid collection or other possible CT explanation for palpable  abnormality. Musculoskeletal: Multilevel degenerative changes are present in the imaged portions of the spine. Long segment thoracolumbar fusion with transpedicular screws at T10, T11 and T12 as well as L2, L3 and L4. Fusion levels bridge a a multipart burst fracture deformity of L1 with approximately 60% height loss which is similar in appearance to comparison CT from 11/20/2019. No acute osseous abnormality or suspicious osseous lesion. IMPRESSION: 1. No visible hernia, lesion, fluid collection, or other possible CT explanation for palpable abnormality in the right lower quadrant. 2. No other CT explanation patient's symptoms. 3. Circumferential bladder wall thickening and luminal gas, nonspecific in the setting of instrumentation. Correlate with urinalysis to exclude cystitis. 4. Moderate stool burden throughout the colon. Correlate for constipation. 5. Stable postsurgical changes from prior thoracolumbar fusion spanning a an L1 burst fracture. No acute osseous abnormalities. Electronically Signed   By: Kreg Shropshire M.D.   On: 01/13/2020 01:19      Assessment/Plan Principal Problem:   UTI (urinary tract infection) Active Problems:   S/P lumbar fusion   Fall from ladder   Cauda equina spinal cord injury (HCC)   Neurogenic bladder   Nausea & vomiting     #1 intractable nausea with vomiting: Most likely secondary to UTI.  Will initiate and continue supportive care.  Zofran as needed.  IV fluids for resuscitation and avoiding dehydration.  #2 UTI: Due to indwelling catheter suspected complex UTI.  Initiate patient on Levaquin IV due to severe penicillin allergy.  Wait for culture and sensitivity results and adjust antibiotics accordingly  #3 cauda equina injury: Subsequent paraplegia with neurogenic bladder.  Continue supportive care.  #4 chronic pain issues: Continue home regimen and monitor   DVT prophylaxis: Lovenox Code Status: Full code Family Communication: No family at  bedside Disposition Plan: To be determined Consults called: None Admission status: Inpatient  Severity of Illness: The appropriate patient status for this patient is INPATIENT. Inpatient status is judged to be reasonable and necessary in order to provide the required intensity of service to ensure the patient's safety. The patient's presenting symptoms, physical exam findings, and initial radiographic and laboratory data in the context of their chronic comorbidities is felt to place them at high risk for further clinical deterioration. Furthermore, it is not anticipated that the patient will be medically stable for discharge from the hospital within 2 midnights of admission. The following factors support the patient status of inpatient.   " The patient's presenting symptoms include nausea vomiting. " The worrisome physical exam findings include paraplegic. " The initial radiographic and laboratory data are worrisome because of normal CBC and CMP. " The chronic co-morbidities include paraplegia from cauda equina injury.   * I certify that at the point of admission it is my clinical judgment that the patient will require inpatient hospital care spanning beyond 2 midnights from the point of admission due to high intensity of service, high risk for further deterioration and high frequency of surveillance required.Lonia Blood MD [facility] Pager [phone]  If 7PM-7AM, please contact night-coverage [email or web address] Password [email or web address]  01/14/2020, 12:18 AM

## 2020-01-13 NOTE — Telephone Encounter (Signed)
Sent the Rx in just now.

## 2020-01-13 NOTE — ED Notes (Signed)
ED Provider at bedside. 

## 2020-01-13 NOTE — Telephone Encounter (Signed)
Patient left a vague message asking to speak to Dr. Berline Chough about a medication.  I called back this morning. Patients wife spoke.  She states that patient transported to the ED last night with high fever, and emesis. She states patient had a voiding appointment earlier in the day that was traumatically painful with bleeding and crystalization (?). She says that when it started.  At the ED he was treated for a UTI.  Patient's wife asks if the UTI could be causing the fever and emesis.  She was also wondering if there was a suppository for zofran as patient cannot keep food or medication down..  Side Note:  As of 2:45 pm 01/13/2020, patient is back at the ED with the same symptoms described previously.

## 2020-01-13 NOTE — ED Notes (Signed)
Wasn't able to get enough blood for both CBC and CMP.

## 2020-01-14 LAB — COMPREHENSIVE METABOLIC PANEL
ALT: 18 U/L (ref 0–44)
AST: 15 U/L (ref 15–41)
Albumin: 2.7 g/dL — ABNORMAL LOW (ref 3.5–5.0)
Alkaline Phosphatase: 49 U/L (ref 38–126)
Anion gap: 6 (ref 5–15)
BUN: 14 mg/dL (ref 8–23)
CO2: 29 mmol/L (ref 22–32)
Calcium: 8.6 mg/dL — ABNORMAL LOW (ref 8.9–10.3)
Chloride: 107 mmol/L (ref 98–111)
Creatinine, Ser: 0.65 mg/dL (ref 0.61–1.24)
GFR calc Af Amer: >60 mL/min (ref >60)
GFR calc non Af Amer: >60 mL/min (ref >60)
Glucose, Bld: 91 mg/dL (ref 70–99)
Potassium: 3.6 mmol/L (ref 3.5–5.1)
Sodium: 142 mmol/L (ref 135–145)
Total Bilirubin: 0.4 mg/dL (ref 0.3–1.2)
Total Protein: 5 g/dL — ABNORMAL LOW (ref 6.5–8.1)

## 2020-01-14 LAB — CBC
HCT: 33.1 % — ABNORMAL LOW (ref 39.0–52.0)
Hemoglobin: 10.6 g/dL — ABNORMAL LOW (ref 13.0–17.0)
MCH: 30.6 pg (ref 26.0–34.0)
MCHC: 32 g/dL (ref 30.0–36.0)
MCV: 95.7 fL (ref 80.0–100.0)
Platelets: 263 10*3/uL (ref 150–400)
RBC: 3.46 MIL/uL — ABNORMAL LOW (ref 4.22–5.81)
RDW: 12.7 % (ref 11.5–15.5)
WBC: 4.4 10*3/uL (ref 4.0–10.5)
nRBC: 0 % (ref 0.0–0.2)

## 2020-01-14 MED ORDER — FLEET ENEMA 7-19 GM/118ML RE ENEM
1.0000 | ENEMA | Freq: Once | RECTAL | Status: AC
  Administered 2020-01-14: 17:00:00 1 via RECTAL
  Filled 2020-01-14: qty 1

## 2020-01-14 MED ORDER — CHLORHEXIDINE GLUCONATE CLOTH 2 % EX PADS
6.0000 | MEDICATED_PAD | Freq: Every day | CUTANEOUS | Status: DC
  Administered 2020-01-14 – 2020-01-18 (×4): 6 via TOPICAL

## 2020-01-14 MED ORDER — METOCLOPRAMIDE HCL 5 MG/ML IJ SOLN
10.0000 mg | Freq: Once | INTRAMUSCULAR | Status: AC
  Administered 2020-01-14: 10 mg via INTRAVENOUS
  Filled 2020-01-14: qty 2

## 2020-01-14 NOTE — Progress Notes (Signed)
Hospitalist progress note   Patient from home, Patient going home likely after d/c , Dispo likely will need 2 to 3 days to finalize cultures and tolerate p.o. reliably before discharge  Louis Jordan [id number] DOB: 05-Mar-1956 DOA: 01/13/2020  PCP: Louis Jordan., MD   Narrative:  79 white male MV prolapse RBB left knee injury-trauma service admission 10/31-11/09/2019 fall from ladder L1 burst fracture spinal stenosis cauda equina anterior shoulder dislocation left ankle trimalleolar fracture with neurogenic bladder-discharged to rehab readmitted 12/6 through 12/8 significant constipation, ileus, cystitis Seen in ED 1/28 nausea chills fever?  Catheter associated urinary infection had a bulge in his right lower quadrant-return to the ED 1/29 persistent nausea vomiting chronic low back pain-felt to have possible pyelonephritis Started on Levaquin  Data Reviewed:  Sodium 142 BUN/creatinine 14/0.6 calcium 8.6 total protein 5 Hemoglobin 10.6 platelet 263 WBC 4.4 Blood cultures from 1/29 pending CT showed circumferential bladder wall thickening and gas moderate stool burden stable postsurgical prior thoracolumbar fusion changes  Assessment & Plan:  Pyelonephritis associated with nausea vomiting Chronic indwelling Foley last changed on 1/26-he was empirically treated with Cipro Urine blood cultures are still pending Continue empiric Levaquin at this time given allergies to penicillin Continue Zofran as needed nausea 4 mg every 6 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 McDiarmid of urology and will need outpatient follow-up regarding management Continue Flomax 0.8 HF MV prolapse History of-no issues at this time Chronic LBBB Chronic pain Long discussion reopiates causing constipation which can precipitate urinary retention and urinary infection-indicated to him that if he is willing with the help of his physical medicine specialist he  can probably de-escalate off of this Continue currently oxycodone 10 every 6 as needed with 10 mg every 2 for breakthrough and OxyContin every 12, gabapentin 600 3 times daily, Robaxin 500 4 times daily Tramadol 50 mg for constipation As above aggressive bowel regimen because of chronic pain continue MiraLAX 17 twice daily senna 8.6 twice daily Insomnia, depression Continue Cymbalta 60 daily, trazodone 50 at bedtime Iron deficiency anemia Continue ferrous sulfate 325 daily a.m.    Subjective: Awake coherent no distress eating drinking some had a sandwich last night which he tolerated no vomiting since yesterday No chest pain No fever No chills Consultants:   None as yet  Objective: Vitals:   01/13/20 1631 01/13/20 1700 01/13/20 2033 01/14/20 0613  BP: 117/73 135/76 134/76 124/71  Pulse: 60 64 61 67  Resp: 17  16 16   Temp:   98.3 F (36.8 C) 97.9 F (36.6 C)  TempSrc:   Oral Oral  SpO2: 90% 96% 98% 97%    Intake/Output Summary (Last 24 hours) at 01/14/2020 0719 Last data filed at 01/14/2020 0620 Gross per 24 hour  Intake 1482.33 ml  Output 700 ml  Net 782.33 ml   There were no vitals filed for this visit.  Examination: EOMI NCAT no focal deficit, extraocular movements intact Vision intact to direct confrontation Neck soft supple Chest clinically clear S1-S2 no murmur rub or gallop Abdomen soft with good augmentation Very asthenic ROM intact power 5/5 bilaterally sensory intact lower extremities reflexes are brisk  Scheduled Meds: . docusate sodium  100 mg Oral BID  . DULoxetine  30 mg Oral Daily  . [START ON 01/16/2020] DULoxetine  60 mg Oral Daily  . enoxaparin (LOVENOX) injection  40 mg Subcutaneous Q24H  . ferrous sulfate  325 mg Oral Q breakfast  . gabapentin  600 mg Oral  TID  . methocarbamol  500 mg Oral QID  . multivitamin with minerals  1 tablet Oral Daily  . oxyCODONE  10 mg Oral Q12H  . polyethylene glycol  17 g Oral BID  . senna  1 tablet Oral BID   . tamsulosin  0.8 mg Oral QPC supper  . traZODone  50 mg Oral QHS   Continuous Infusions: . sodium chloride 100 mL/hr at 01/13/20 2036  . levofloxacin (LEVAQUIN) IV 500 mg (01/14/20 0538)     LOS: 1 day   Time spent: No charge today Verneita Griffes, MD [facility]  01/14/2020, 7:19 AM

## 2020-01-15 DIAGNOSIS — F4321 Adjustment disorder with depressed mood: Secondary | ICD-10-CM

## 2020-01-15 LAB — URINE CULTURE: Culture: 60000 — AB

## 2020-01-15 MED ORDER — TRAZODONE HCL 50 MG PO TABS
100.0000 mg | ORAL_TABLET | Freq: Every evening | ORAL | Status: DC | PRN
  Administered 2020-01-15 – 2020-01-18 (×4): 100 mg via ORAL
  Filled 2020-01-15 (×4): qty 2

## 2020-01-15 MED ORDER — LITHIUM CARBONATE 150 MG PO CAPS
150.0000 mg | ORAL_CAPSULE | Freq: Two times a day (BID) | ORAL | Status: DC
  Administered 2020-01-15 – 2020-01-18 (×6): 150 mg via ORAL
  Filled 2020-01-15 (×8): qty 1

## 2020-01-15 MED ORDER — PANTOPRAZOLE SODIUM 40 MG PO TBEC
40.0000 mg | DELAYED_RELEASE_TABLET | Freq: Every day | ORAL | Status: DC
  Administered 2020-01-15 – 2020-01-19 (×5): 40 mg via ORAL
  Filled 2020-01-15 (×5): qty 1

## 2020-01-15 MED ORDER — ENSURE ENLIVE PO LIQD
237.0000 mL | Freq: Two times a day (BID) | ORAL | Status: DC
  Administered 2020-01-15 – 2020-01-16 (×3): 237 mL via ORAL

## 2020-01-15 MED ORDER — SUCRALFATE 1 GM/10ML PO SUSP
1.0000 g | Freq: Three times a day (TID) | ORAL | Status: DC
  Administered 2020-01-15 – 2020-01-19 (×14): 1 g via ORAL
  Filled 2020-01-15 (×14): qty 10

## 2020-01-15 MED ORDER — OXYCODONE HCL 5 MG PO TABS
5.0000 mg | ORAL_TABLET | Freq: Four times a day (QID) | ORAL | Status: DC | PRN
  Administered 2020-01-15: 23:00:00 10 mg via ORAL
  Administered 2020-01-15 – 2020-01-16 (×2): 5 mg via ORAL
  Administered 2020-01-16 – 2020-01-18 (×3): 10 mg via ORAL
  Filled 2020-01-15 (×4): qty 2
  Filled 2020-01-15 (×2): qty 1

## 2020-01-15 MED ORDER — LORAZEPAM 0.5 MG PO TABS
0.5000 mg | ORAL_TABLET | Freq: Two times a day (BID) | ORAL | Status: DC
  Administered 2020-01-15 – 2020-01-19 (×9): 0.5 mg via ORAL
  Filled 2020-01-15 (×9): qty 1

## 2020-01-15 NOTE — Progress Notes (Addendum)
Hospitalist progress note   Patient from home, Patient going home likely after d/c , Dispo likely will need 24-48 hr to tolerate p.o. reliably before discharge  Louis Jordan [id number] DOB: 14-Sep-1956 DOA: 01/13/2020  PCP: [name]., MD  Narrative:  42 white male MV prolapse RBB left knee injury-trauma service admission 10/31-11/09/2019 fall from ladder L1 burst fracture spinal stenosis cauda equina anterior shoulder dislocation left ankle trimalleolar fracture with neurogenic bladder-discharged to rehab readmitted 12/6 through 12/8 significant constipation, ileus, cystitis Seen in ED 1/28 nausea chills fever?  Catheter associated urinary infection had a bulge in his right lower quadrant-return to the ED 1/29 persistent nausea vomiting chronic low back pain-felt to have possible pyelonephritis Started on Levaquin  Data Reviewed:  Sodium 142 BUN/creatinine 14/0.6 calcium 8.6 total protein 5 Hemoglobin 10.6 platelet 263 WBC 4.4 Blood cultures from 1/29 pending CT showed circumferential bladder wall thickening and gas moderate stool burden stable postsurgical prior thoracolumbar fusion changes  Assessment & Plan:  Pyelonephritis ?pseudomonas  Chronic indwelling Foley last changed on 1/26-he was empirically treated with Cipro Changing to PO levaquin-see if toelrates and no feve rovernight- N/v and likely reflux 2/2 to pyelo Started on Thursday-- Reglan = abd dicomfort--sod/c Continue Zofran as needed nausea 4 mg every 6  Carafate + protonix--sticking sensation persists--ate breakfast so far mild discofmort--monitor trends 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 McDiarmid of urology and will need outpatient follow-up regarding management has had ~ 5 Urinary infections since catheter placed Continue Flomax 0.8 HF MV prolapse History of-no issues at this time Chronic LBBB Chronic pain Patient has de-escalated - oxycodone 5-10  every 6 as OxyContin every 12, gabapentin 600 3 times daily, Robaxin 500 4 times daily Tramadol 50 mg for constipation As above aggressive bowel regimen because of chronic pain continue MiraLAX 17 twice daily senna 8.6 twice daily Insomnia, depression started Cymbalta 60 daily for neuropathy on 01/09/20--, trazodone 50 at bedtime 1/31 start lithium 150 bid Pscyh saw patient- no SI concern--Rec's OP therapy Iron deficiency anemia Continue ferrous sulfate 325 daily a.m.    Subjective:  Better spirits eating some--doesn't like diet  Which limits intake No cp Some discomfort in throat but improved compared to prior passing 2 stools No fever   Consultants:   None as yet  Objective: Vitals:   01/14/20 1215 01/14/20 1316 01/14/20 2043 01/15/20 0639  BP:  (!) 107/56 (!) 149/87 (!) 155/83  Pulse:  73 61 65  Resp:  15 16 17   Temp:  98.6 F (37 C) 98 F (36.7 C) 98.4 F (36.9 C)  TempSrc:  Oral Oral Oral  SpO2:  93% 94% 90%  Height: 6' (1.829 m)       Intake/Output Summary (Last 24 hours) at 01/15/2020 1042 Last data filed at 01/14/2020 2215 Gross per 24 hour  Intake 2446.76 ml  Output 650 ml  Net 1796.76 ml   There were no vitals filed for this visit.  Examination:  Pleasant engaging cta b no added sound abd soft no rebound no epig tender-no HSM rom grossly intact no focal deficit  Scheduled Meds: . Chlorhexidine Gluconate Cloth  6 each Topical Daily  . docusate sodium  100 mg Oral BID  . DULoxetine  60 mg Oral Daily  . enoxaparin (LOVENOX) injection  40 mg Subcutaneous Q24H  . feeding supplement (ENSURE ENLIVE)  237 mL Oral BID BM  . ferrous sulfate  325 mg Oral Q breakfast  . gabapentin  600  mg Oral TID  . levofloxacin  500 mg Oral Daily  . lithium carbonate  150 mg Oral BID WC  . LORazepam  0.5 mg Oral BID  . methocarbamol  500 mg Oral QID  . multivitamin with minerals  1 tablet Oral Daily  . oxyCODONE  10 mg Oral Q12H  . pantoprazole  40 mg Oral Daily  .  polyethylene glycol  17 g Oral BID  . senna  1 tablet Oral BID  . sucralfate  1 g Oral TID WC & HS  . tamsulosin  0.8 mg Oral QPC supper   Continuous Infusions: . sodium chloride 100 mL/hr at 01/16/20 1032     LOS: 2 days   Time spent: [place], MD [facility]  01/15/2020, 10:42 AM

## 2020-01-15 NOTE — Consult Note (Signed)
Telepsych Consultation   Reason for Consult:  ''I feel like giving up, I don't care if I die.'' Referring Physician:  Rhetta MuraSamtani Jai-Gurmukh, MD Location of Patient: [id number] Location of Provider: [facility]  Patient Identification: Louis Jordan MRN:  [id number] Principal Diagnosis: UTI (urinary tract infection) Diagnosis:  Principal Problem:   UTI (urinary tract infection) Active Problems:   S/P lumbar fusion   Fall from ladder   Cauda equina spinal cord injury (HCC)   Neurogenic bladder   Nausea & vomiting   Adjustment disorder with depressed mood   Total Time spent with patient: 1 hour  Subjective:   Louis Jordan is a 64 y.o. male patient admitted with urinary tract infection  HPI:  Patient is a  64 y.o. 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 He was later diagnosed with Urinary track infection for which he is now placed on broad spectrum antibiotics. Patient was assessed in the presence of his wife-Jessica. Both reports that patient fell about 18 feet off a ladder in October, 2020 and sustained a spinal chord injury, multiple fractures and later diagnosed with neurogenic bladder and now requires indwelling foley catheter and has been having recurrent UTI. Patient admitted to feeling depressed since the fall off the ladder, denies prior history of mental illness, psychiatric treatment or suicide attempts. However, he reports that he is getting overwhelmed by his condition and made a statement ''I feel like giving up, I don't care if I die" this morning when he was taking to his provider out of frustrations. He described himself and as ''I am a husband, father, grandfather, religious who has a lot to live for and has no plan to take my life.''Patient's wife does not believe her husband will take  his life because he has a lot to live for but he is frustrated about his current physical.medical condition. Patient admits to feeling depressed, but denies self harming thoughts, psychosis or delusions. He is hopeful a treatment will be found for his recurrent UTI.   Past Psychiatric History: None reported  Risk to Self:   denies Risk to Others:  denies Prior Inpatient Therapy:  none in the past Prior Outpatient Therapy:  none  Past Medical History:  Past Medical History:  Diagnosis Date  . Bundle branch block, right   . Complication of anesthesia   . Hand pain    mild OA bilateral  hands  . Left knee injury 1970   requiring surgery for repair of ligaments/cartilage damage with nerve injury that took 20 years to resolve  . MVP (mitral valve prolapse)   . PONV (postoperative nausea and vomiting)     Past Surgical History:  Procedure Laterality Date  . APPENDECTOMY    . KNEE SURGERY Right   . LUMBAR SPINE SURGERY  10/16/2019   L1 burst fracture with epidural hematoma and canal stenosis  . OPEN REDUCTION INTERNAL FIXATION (ORIF) TIBIA/FIBULA FRACTURE Left 10/19/2019   Procedure: OPEN REDUCTION INTERNAL FIXATION (ORIF) TIBIA/FIBULA FRACTURE;  Surgeon: Roby LoftsHaddix, Kevin P, MD;  Location: [facility] OR;  Service: Orthopedics;  Laterality: Left;  . SHOULDER ARTHROSCOPY WITH BANKART REPAIR Left 10/19/2019   Procedure: SHOULDER ARTHROSCOPY WITH BANKART REPAIR, EXTENSIVE DEBRIDEMENT, LOOSE BODY REMOVAL;  Surgeon: Bjorn PippinVarkey, Dax T, MD;  Location: [facility] OR;  Service: Orthopedics;  Laterality: Left;  . STRABISMUS SURGERY    . TONSILLECTOMY  Family History:  Family History  Problem Relation Age of Onset  . Heart disease Mother   . Alzheimer's disease Mother   . Pancreatic cancer Father   . High blood pressure Brother    Family Psychiatric  History:  Social History:  Social History   Substance and Sexual Activity  Alcohol Use Yes   Comment: RARE     Social History   Substance and Sexual Activity   Drug Use Never    Social History   Socioeconomic History  . Marital status: Married    Spouse name: Not on file  . Number of children: Not on file  . Years of education: Not on file  . Highest education level: Not on file  Occupational History  . Not on file  Tobacco Use  . Smoking status: Never Smoker  . Smokeless tobacco: Never Used  Substance and Sexual Activity  . Alcohol use: Yes    Comment: RARE  . Drug use: Never  . Sexual activity: Not on file  Other Topics Concern  . Not on file  Social History Narrative  . Not on file   Social Determinants of Health   Financial Resource Strain:   . Difficulty of Paying Living Expenses: Not on file  Food Insecurity:   . Worried About Programme researcher, broadcasting/film/videounning Out of Food in the Last Year: Not on file  . Ran Out of Food in the Last Year: Not on file  Transportation Needs:   . Lack of Transportation (Medical): Not on file  . Lack of Transportation (Non-Medical): Not on file  Physical Activity:   . Days of Exercise per Week: Not on file  . Minutes of Exercise per Session: Not on file  Stress:   . Feeling of Stress : Not on file  Social Connections:   . Frequency of Communication with Friends and Family: Not on file  . Frequency of Social Gatherings with Friends and Family: Not on file  . Attends Religious Services: Not on file  . Active Member of Clubs or Organizations: Not on file  . Attends BankerClub or Organization Meetings: Not on file  . Marital Status: Not on file   Additional Social History:    Allergies:   Allergies  Allergen Reactions  . Penicillins Swelling    Did it involve swelling of the face/tongue/throat, SOB, or low BP? Y Did it involve sudden or severe rash/hives, skin peeling, or any reaction on the inside of your mouth or nose? Y Did you need to seek medical attention at a hospital or doctor's office? N When did it last happen?Decades ago. If all above answers are "NO", may proceed with cephalosporin use.     Labs:   Results for orders placed or performed during the hospital encounter of 01/13/20 (from the past 48 hour(s))  CBC with Differential     Status: Abnormal   Collection Time: 01/13/20  3:40 PM  Result Value Ref Range   WBC 6.7 4.0 - 10.5 K/uL   RBC 3.79 (L) 4.22 - 5.81 MIL/uL   Hemoglobin 11.9 (L) 13.0 - 17.0 g/dL   HCT 13.035.7 (L) 86.539.0 - 78.452.0 %   MCV 94.2 80.0 - 100.0 fL   MCH 31.4 26.0 - 34.0 pg   MCHC 33.3 30.0 - 36.0 g/dL   RDW 69.612.7 29.511.5 - 28.415.5 %   Platelets 294 150 - 400 K/uL   nRBC 0.0 0.0 - 0.2 %   Neutrophils Relative % 82 %   Neutro Abs 5.4 1.7 -  7.7 K/uL   Lymphocytes Relative 9 %   Lymphs Abs 0.6 (L) 0.7 - 4.0 K/uL   Monocytes Relative 6 %   Monocytes Absolute 0.4 0.1 - 1.0 K/uL   Eosinophils Relative 3 %   Eosinophils Absolute 0.2 0.0 - 0.5 K/uL   Basophils Relative 0 %   Basophils Absolute 0.0 0.0 - 0.1 K/uL   Immature Granulocytes 0 %   Abs Immature Granulocytes 0.02 0.00 - 0.07 K/uL    Comment: Performed at [facility], [address] [place], [place] [address]  Comprehensive metabolic panel     Status: Abnormal   Collection Time: 01/13/20  3:40 PM  Result Value Ref Range   Sodium 141 135 - 145 mmol/L   Potassium 4.0 3.5 - 5.1 mmol/L   Chloride 102 98 - 111 mmol/L   CO2 31 22 - 32 mmol/L   Glucose, Bld 112 (H) 70 - 99 mg/dL   BUN 14 8 - 23 mg/dL   Creatinine, Ser 0.97 0.61 - 1.24 mg/dL   Calcium 9.0 8.9 - 35.3 mg/dL   Total Protein 5.7 (L) 6.5 - 8.1 g/dL   Albumin 3.2 (L) 3.5 - 5.0 g/dL   AST 18 15 - 41 U/L   ALT 21 0 - 44 U/L   Alkaline Phosphatase 57 38 - 126 U/L   Total Bilirubin 0.8 0.3 - 1.2 mg/dL   GFR calc non Af Amer >60 >60 mL/min   GFR calc Af Amer >60 >60 mL/min   Anion gap 8 5 - 15    Comment: Performed at [facility], [address] [place], [place] [address]  Lipase, blood     Status: None   Collection Time: 01/13/20  3:40 PM  Result Value Ref Range   Lipase 14 11 - 51 U/L    Comment: Performed at  [facility], [address] [place], [place] [address]  Blood culture (routine x 2)     Status: None (Preliminary result)   Collection Time: 01/13/20  6:01 PM   Specimen: BLOOD RIGHT HAND  Result Value Ref Range   Specimen Description      BLOOD RIGHT HAND Performed at [facility], [address] [place], [place] [address]    Special Requests      BOTTLES DRAWN AEROBIC AND ANAEROBIC Blood Culture results may not be optimal due to an inadequate volume of blood received in culture bottles Performed at [facility], [address] [place], [place] [address]    Culture      NO GROWTH < 24 HOURS Performed at [facility], [address] [place], [place] [address]    Report Status PENDING   Blood culture (routine x 2)     Status: None (Preliminary result)   Collection Time: 01/13/20  6:05 PM   Specimen: BLOOD LEFT HAND  Result Value Ref Range   Specimen Description      BLOOD LEFT HAND Performed at [facility], [address] [place], [place] [address]    Special Requests      BOTTLES DRAWN AEROBIC AND ANAEROBIC Blood Culture adequate volume Performed at [facility], [address], [place], [place] [address]    Culture      NO GROWTH < 24 HOURS Performed at [facility], [address], [place], [place] [address]    Report Status PENDING   Comprehensive metabolic panel  Status: Abnormal   Collection Time: 01/14/20  4:15 AM  Result Value Ref Range   Sodium 142 135 - 145 mmol/L   Potassium 3.6 3.5 - 5.1 mmol/L   Chloride 107 98 - 111 mmol/L   CO2 29 22 - 32 mmol/L   Glucose, Bld 91 70 - 99 mg/dL   BUN 14 8 - 23 mg/dL   Creatinine, Ser 3.66 0.61 - 1.24 mg/dL   Calcium 8.6 (L) 8.9 - 10.3 mg/dL   Total Protein 5.0 (L) 6.5 - 8.1 g/dL   Albumin 2.7 (L) 3.5 - 5.0 g/dL   AST 15 15 - 41 U/L   ALT 18 0 - 44 U/L   Alkaline Phosphatase 49 38 - 126 U/L   Total  Bilirubin 0.4 0.3 - 1.2 mg/dL   GFR calc non Af Amer >60 >60 mL/min   GFR calc Af Amer >60 >60 mL/min   Anion gap 6 5 - 15    Comment: Performed at [facility], [address], [place], [place] [address]  CBC     Status: Abnormal   Collection Time: 01/14/20  4:15 AM  Result Value Ref Range   WBC 4.4 4.0 - 10.5 K/uL   RBC 3.46 (L) 4.22 - 5.81 MIL/uL   Hemoglobin 10.6 (L) 13.0 - 17.0 g/dL   HCT 54.6 (L) 50.3 - 54.6 %   MCV 95.7 80.0 - 100.0 fL   MCH 30.6 26.0 - 34.0 pg   MCHC 32.0 30.0 - 36.0 g/dL   RDW 56.8 12.7 - 51.7 %   Platelets 263 150 - 400 K/uL   nRBC 0.0 0.0 - 0.2 %    Comment: Performed at [facility], [address], [place], [place] [address]    Medications:  Current Facility-Administered Medications  Medication Dose Route Frequency Provider Last Rate Last Admin  . 0.9 %  sodium chloride infusion   Intravenous Continuous Rometta Emery, MD 100 mL/hr at 01/15/20 0139 New Bag at 01/15/20 0139  . acetaminophen (TYLENOL) tablet 650 mg  650 mg Oral Q6H PRN Rometta Emery, MD   650 mg at 01/14/20 2309   Or  . acetaminophen (TYLENOL) suppository 650 mg  650 mg Rectal Q6H PRN Rometta Emery, MD      . bisacodyl (DULCOLAX) suppository 10 mg  10 mg Rectal Daily PRN Rometta Emery, MD   10 mg at 01/14/20 1440  . Chlorhexidine Gluconate Cloth 2 % PADS 6 each  6 each Topical Daily Rhetta Mura, MD   6 each at 01/15/20 1003  . docusate sodium (COLACE) capsule 100 mg  100 mg Oral BID Earlie Lou L, MD   100 mg at 01/15/20 0948  . [START ON 01/16/2020] DULoxetine (CYMBALTA) DR capsule 60 mg  60 mg Oral Daily Garba, Mohammad L, MD      . enoxaparin (LOVENOX) injection 40 mg  40 mg Subcutaneous Q24H Earlie Lou L, MD   40 mg at 01/14/20 2311  . ferrous sulfate tablet 325 mg  325 mg Oral Q breakfast Rometta Emery, MD   325 mg at 01/15/20 0745  . gabapentin (NEURONTIN) capsule 600 mg  600 mg Oral TID Rometta Emery, MD    600 mg at 01/15/20 0948  . hydrocortisone (ANUSOL-HC) 2.5 % rectal cream 1 application  1 application Rectal TID PRN Rometta Emery, MD   1 application at 01/14/20 1702  . levofloxacin (LEVAQUIN) IVPB 500 mg  500 mg Intravenous Q24H  Rometta Emery, MD 100 mL/hr at 01/15/20 0659 500 mg at 01/15/20 0659  . lithium carbonate capsule 150 mg  150 mg Oral BID WC Samtani, Jai-Gurmukh, MD      . LORazepam (ATIVAN) tablet 0.5 mg  0.5 mg Oral BID Rhetta Mura, MD   0.5 mg at 01/15/20 1036  . methocarbamol (ROBAXIN) tablet 500 mg  500 mg Oral QID Rometta Emery, MD   500 mg at 01/15/20 0948  . multivitamin with minerals tablet 1 tablet  1 tablet Oral Daily Rometta Emery, MD   1 tablet at 01/15/20 0948  . ondansetron (ZOFRAN) tablet 4 mg  4 mg Oral Q6H PRN Rometta Emery, MD       Or  . ondansetron (ZOFRAN) injection 4 mg  4 mg Intravenous Q6H PRN Rometta Emery, MD   4 mg at 01/15/20 0952  . oxyCODONE (Oxy IR/ROXICODONE) immediate release tablet 10 mg  10 mg Oral Q6H PRN Milagros Loll, MD   10 mg at 01/15/20 0138  . oxyCODONE (Oxy IR/ROXICODONE) immediate release tablet 10 mg  10 mg Oral BID PRN Rometta Emery, MD      . oxyCODONE (OXYCONTIN) 12 hr tablet 10 mg  10 mg Oral Q12H Rometta Emery, MD   10 mg at 01/15/20 0948  . pantoprazole (PROTONIX) EC tablet 40 mg  40 mg Oral Daily Rhetta Mura, MD   40 mg at 01/15/20 1133  . polyethylene glycol (MIRALAX / GLYCOLAX) packet 17 g  17 g Oral BID Rometta Emery, MD   17 g at 01/15/20 0948  . promethazine (PHENERGAN) suppository 25 mg  25 mg Rectal Q6H PRN Rometta Emery, MD      . senna (SENOKOT) tablet 8.6 mg  1 tablet Oral BID Earlie Lou L, MD   8.6 mg at 01/15/20 0948  . sucralfate (CARAFATE) 1 GM/10ML suspension 1 g  1 g Oral TID WC & HS Samtani, Jai-Gurmukh, MD   1 g at 01/15/20 1133  . tamsulosin (FLOMAX) capsule 0.8 mg  0.8 mg Oral QPC supper Rometta Emery, MD   0.8 mg at 01/14/20 2033  .  traMADol (ULTRAM) tablet 50 mg  50 mg Oral Q6H PRN Rometta Emery, MD   50 mg at 01/15/20 1230  . traZODone (DESYREL) tablet 100 mg  100 mg Oral QHS PRN Thedore Mins, MD        Musculoskeletal: Strength & Muscle Tone: within normal limits and not tested-patient assessed via tele health Gait & Station: not tested-patient assessed via tele health Patient leans: N/A  Psychiatric Specialty Exam: Physical Exam  Psychiatric: His speech is normal and behavior is normal. Judgment and thought content normal. Cognition and memory are normal. He exhibits a depressed mood.    Review of Systems  Constitutional: Negative.   HENT: Negative.   Eyes: Negative.   Respiratory: Negative.   Cardiovascular: Negative.   Gastrointestinal: Negative.   Endocrine: Negative.   Genitourinary: Positive for difficulty urinating.  Allergic/Immunologic: Negative.   Psychiatric/Behavioral: Positive for dysphoric mood.    Blood pressure 121/75, pulse 75, temperature 99.5 F (37.5 C), temperature source Oral, resp. rate 17, height 6' (1.829 m), SpO2 92 %.Body mass index is 19.53 kg/m.  General Appearance: Casual  Eye Contact:  Good  Speech:  Clear and Coherent  Volume:  Decreased  Mood:  Dysphoric  Affect:  Constricted  Thought Process:  Coherent and Linear  Orientation:  Full (Time, Place, and Person)  Thought Content:  Logical  Suicidal Thoughts:  No  Homicidal Thoughts:  No  Memory:  Immediate;   Good Recent;   Good Remote;   Good  Judgement:  Intact  Insight:  Fair  Psychomotor Activity:  Psychomotor Retardation  Concentration:  Concentration: Fair and Attention Span: Fair  Recall:  Good  Fund of Knowledge:  Good  Language:  Good  Akathisia:  No  Handed:  Right  AIMS (if indicated):     Assets:  Communication Skills Desire for Improvement Social Support Others:  family support  ADL's:  Impaired  Cognition:  WNL  Sleep:   fair     Treatment Plan Summary: 64 year old male who  denies prior history of mental illness but now reporting depressive symptoms following an accidental fall from 18 feet ladder after which he now has neurogenic bladder and spinal chord injury. Today, patient denies psychosis, delusions, self harming thoughts but he is willing to be referred for counseling.  Recommendations: -Continue Cymbalta 60 mg daily for depression/neurogenic pain but be aware that combining Cymbalta with too many opioid based medications can result in Serotonin syndrome. -Consider changing Trazodone to 100 mg at bedtime PRN for insomnia. -Consider Social worker consult to facilitate patient referral to an outpatient therapist/counselor.  Disposition: No evidence of imminent risk to self or others at present.   Patient does not meet criteria for psychiatric inpatient admission. Supportive therapy provided about ongoing stressors. Psychiatric service signing out. Re-consult as needed  This service was provided via telemedicine using a 2-way, interactive audio and video technology.  Names of all persons participating in this telemedicine service and their role in this encounter. Name: Louis Jordan Role: Patient  Name: Hollie Bartus Role: Wife  Name: Wyatt Portela Role: RN  Name: Corena Pilgrim, MD Role: Psychiatrist    Corena Pilgrim, MD 01/15/2020 1:32 PM

## 2020-01-15 NOTE — Progress Notes (Signed)
Initial Nutrition Assessment  DOCUMENTATION CODES:   Not applicable  INTERVENTION:   Ensure Enlive po BID, each supplement provides 350 kcal and 20 grams of protein  MVI daily   NUTRITION DIAGNOSIS:   Increased nutrient needs related to acute illness as evidenced by increased estimated needs.  GOAL:   Patient will meet greater than or equal to 90% of their needs  MONITOR:   PO intake, Supplement acceptance, Labs, Weight trends, Skin, I & O's  REASON FOR ASSESSMENT:   Malnutrition Screening Tool    ASSESSMENT:   64 y.o. 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 Pt found to have UTI and pyelonephritis  RD working remotely.  Pt with poor appetite and oral intake for several days pta. Pt with improved appetite and oral intake in hospital; pt eating 100% of meals. RD will add supplements to help pt meet his estimated needs. Per chart, pt is weight stable pta.   Medications reviewed and include: colace, lovenox, ferrous sulfate, MVI, oxycodone, protonix, miralax, senokot, carafate, NaCl @100ml /hr  Labs reviewed: Hgb 10.6(L), Hct 33.1(L)  Unable to complete Nutrition-Focused physical exam at this time.   Diet Order:   Diet Order            Diet regular Room service appropriate? Yes; Fluid consistency: Thin  Diet effective now             EDUCATION NEEDS:   No education needs have been identified at this time  Skin:  Skin Assessment: Reviewed RN Assessment  Last BM:  1/30- type 6  Height:   Ht Readings from Last 1 Encounters:  01/14/20 6' (1.829 m)    Weight:   Wt Readings from Last 1 Encounters:  01/09/20 65.3 kg    Ideal Body Weight:  72.7 kg(adjusted by 10% for paraplegia)  BMI:  Body mass index is 19.53 kg/m.  Estimated Nutritional Needs:   Kcal:   1900-2200kcal/day  Protein:  95-110g/day  Fluid:  >1.9L/day  01/11/20 MS, RD, LDN Pager #- [phone] Office#- [phone] After Hours Pager: [phone]

## 2020-01-16 ENCOUNTER — Telehealth: Payer: Self-pay | Admitting: Emergency Medicine

## 2020-01-16 MED ORDER — LEVOFLOXACIN 500 MG PO TABS
500.0000 mg | ORAL_TABLET | Freq: Every day | ORAL | Status: DC
  Administered 2020-01-16 – 2020-01-18 (×3): 500 mg via ORAL
  Filled 2020-01-16 (×3): qty 1

## 2020-01-16 NOTE — Telephone Encounter (Signed)
Post ED Visit - Positive Culture Follow-up  Culture report reviewed by antimicrobial stewardship pharmacist: [facility] []  , Pharm.D. []  Enzo Bi, .D., BCPS AQ-ID []  Celedonio Miyamoto, Pharm.D., BCPS []  [address], Pharm.D., BCPS []  [place], Garvin Fila.D., BCPS, AAHIVP []  , Pharm.D., BCPS, AAHIVP []  Georgina Pillion, PharmD, BCPS []  , PharmD, BCPS []  [place], PharmD, BCPS []  [place], PharmD []  , PharmD, BCPS []  Estella Husk, PharmD  Pharmacy Team []  Lysle Pearl, PharmD []  , PharmD []  Phillips Climes, PharmD []  , Rph []  Agapito Games) , PharmD []  Verlan Friends, PharmD [x]  , PharmD []  Mervyn Gay, PharmD []  , PharmD []  Vinnie Level, PharmD []  [facility], PharmD []  , PharmD []  Len Childs, PharmD   Positive urine culture Treated with ciprofloxacin, organism sensitive to the same and no further patient follow-up is required at this time.  01/16/2020, 10:18 AM

## 2020-01-16 NOTE — Progress Notes (Signed)
Patient is complaining of pain in his lower abdomen with it radiating to his back. The patient said the pain is similar to what he experienced on admission and the patient wants the doctor to be made aware tomorrow during rounds.

## 2020-01-16 NOTE — TOC Initial Note (Signed)
Transition of Care [facility]) - Initial/Assessment Note    Patient Details  Name: Louis Jordan MRN: [id number] Date of Birth: 1956/10/28  Transition of [facility]) CM/SW Contact:    Bartholome Bill, RN Phone Number: 01/16/2020, 1:24 PM  Expected Discharge Plan: Home/Self Care Barriers to Discharge: Continued Medical Work up  Expected Discharge Plan and Services Expected Discharge Plan: Home/Self Care       Living arrangements for the past 2 months: Single Family Home                  Prior Living Arrangements/Services Living arrangements for the past 2 months: Single Family Home Lives with:: Spouse Patient language and need for interpreter reviewed:: Yes        Need for Family Participation in Patient Care: Yes (Comment) Care giver support system in place?: Yes (comment)   Criminal Activity/Legal Involvement Pertinent to Current Situation/Hospitalization: No - Comment as needed  Activities of Daily Living Home Assistive Devices/Equipment: Walker (specify type) ADL Screening (condition at time of admission) Patient's cognitive ability adequate to safely complete daily activities?: Yes Is the patient deaf or have difficulty hearing?: No Does the patient have difficulty seeing, even when wearing glasses/contacts?: No Does the patient have difficulty concentrating, remembering, or making decisions?: No Patient able to express need for assistance with ADLs?: Yes Does the patient have difficulty dressing or bathing?: No Independently performs ADLs?: Yes (appropriate for developmental age) Does the patient have difficulty walking or climbing stairs?: Yes Weakness of Legs: Both Weakness of Arms/Hands: Both   Admission diagnosis:  UTI (urinary tract infection) [N39.0] Complicated UTI (urinary tract infection) [N39.0] Patient Active Problem List   Diagnosis Date Noted  . Adjustment disorder with depressed mood 01/15/2020  . UTI (urinary tract infection) 01/13/2020  .  Encounter for therapeutic drug monitoring 01/09/2020  . Nausea & vomiting 11/21/2019  . Ileus (HCC) 11/21/2019  . Hypoalbuminemia due to protein-calorie malnutrition (HCC)   . Acute blood loss anemia   . Postoperative pain   . Neuropathic pain   . Neurogenic bladder 10/26/2019  . Neurogenic bowel 10/26/2019  . Spinal cord injury, 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  . Shoulder dislocation, left, initial encounter   . Closed burst fracture of lumbar vertebra (HCC)   . Cauda equina spinal cord injury (HCC)   . Closed fracture of left ankle   . Fall from ladder 10/22/2019  . 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 Closed displaced trimalleolar fracture of left ankle 10/22/2019  . S/P lumbar fusion 10/16/2019   PCP:  [name]., MD Pharmacy:   [facility] [phone] [place], [place] - [phone] AT [facility] [address] [facility] [address] [place] [address] Phone: [phone] Fax: [phone]     Social Determinants of Health (SDOH) Interventions    Readmission Risk Interventions Readmission Risk Prevention Plan 01/16/2020  Transportation Screening Complete  PCP or Specialist Appt within 3-5 Days Complete  HRI or Home Care Consult Complete  Palliative Care Screening Not Applicable  Medication Review (RN Care Manager) Complete  Some recent data might be hidden

## 2020-01-16 NOTE — Progress Notes (Signed)
Note from 1/31 was inadvertently lost--here is cpopy  Hospitalist progress note  Patient from home, Patient going home likely after d/c , Dispo likely will need 2 to 3 days to finalize cultures and tolerate p.o. reliably before discharge  Louis Jordan [id number] DOB: 11-Feb-1956 DOA: 01/13/2020 PCP: [name]., MD  Narrative:  6 white male MV prolapse RBB left knee injury-trauma service admission 10/31-11/09/2019 fall from ladder L1 burst fracture spinal stenosis cauda equina anterior shoulder dislocation left ankle trimalleolar fracture with neurogenic bladder-discharged to rehab readmitted 12/6 through 12/8 significant constipation, ileus, cystitis  Seen in ED 1/28 nausea chills fever? Catheter associated urinary infection had a bulge in his right lower quadrant-return to the ED 1/29 persistent nausea vomiting chronic low back pain-felt to have possible pyelonephritis  Started on Levaquin  Data Reviewed:  Sodium 142 BUN/creatinine 14/0.6 calcium 8.6 total protein 5  Hemoglobin 10.6 platelet 263 WBC 4.4  Blood cultures from 1/29 pending  CT showed circumferential bladder wall thickening and gas moderate stool burden stable postsurgical prior thoracolumbar fusion changes  Assessment & Plan:  Pyelonephritis ?pseudomonas  Chronic indwelling Foley last changed on 1/26-he was empirically treated with Cipro  Continue empiric Levaquin at this time given allergies to penicillin  N/v and likely reflux 2/2 to pyelo  Started on Thursday--trialed Reglan but caused abd dicomfort--so will d/c  Continue Zofran as needed nausea 4 mg every 6  Adding Carafate + protonix--if no overall improvement, would ask GI to see--he hasn't vomited since coming here but still feels nauseous and if continues to have a sticking feeling in his throat may warrant GI input  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 McDiarmid of urology and will need outpatient  follow-up regarding management  has had ~ 5 Urinary infections since catheter placed  Continue Flomax 0.8 HF  MV prolapse  History of-no issues at this time  Chronic LBBB  Chronic pain  Mentioned constipation/Opiates which can precipitate urinary retention and urinary infection-asked him to consider -escalation slowly  Continue currently oxycodone 10 every 6 as needed with 10 mg every 2 for breakthrough and OxyContin every 12, gabapentin 600 3 times daily, Robaxin 500 4 times daily  Tramadol 50 mg for constipation  As above aggressive bowel regimen because of chronic pain continue MiraLAX 17 twice daily senna 8.6 twice daily  Insomnia, depression  started Cymbalta 60 daily for neuropathy on 01/09/20--, trazodone 50 at bedtime  Will start lithium 150 bid and cautiously up titrate if agreed upon by psychiatry--I have consulted them today  Iron deficiency anemia  Continue ferrous sulfate 325 daily a.m.  Subjective:  Tearful depressed  Asking for relief form abd issues  No vomit no chill no f no cough no cold  Consultants:  None as yet Objective:        Vitals:   01/14/20 1215 01/14/20 1316 01/14/20 2043 01/15/20 0639  BP:  (!) 107/56 (!) 149/87 (!) 155/83  Pulse:  73 61 65  Resp:  15 16 17   Temp:  98.6 F (37 C) 98 F (36.7 C) 98.4 F (36.9 C)  TempSrc:  Oral Oral Oral  SpO2:  93% 94% 90%  Height: 6' (1.829 m)       Intake/Output Summary (Last 24 hours) at 01/15/2020 1042  Last data filed at 01/14/2020 2215     Gross per 24 hour  Intake 2446.76 ml  Output 650 ml  Net 1796.76 ml   There were no vitals filed  for this visit.  Examination:  Tearful poor eye contact  cta b no added sound  abd soft no rebound no epig tender-no HSM  rom grossly intact no focal deficit  Scheduled Meds:  . Chlorhexidine Gluconate Cloth 6 each Topical Daily  . docusate sodium 100 mg Oral BID  . [START ON 01/16/2020] DULoxetine 60 mg Oral Daily  . enoxaparin (LOVENOX) injection 40 mg Subcutaneous  Q24H  . ferrous sulfate 325 mg Oral Q breakfast  . gabapentin 600 mg Oral TID  . lithium carbonate 150 mg Oral BID WC  . LORazepam 0.5 mg Oral BID  . methocarbamol 500 mg Oral QID  . multivitamin with minerals 1 tablet Oral Daily  . oxyCODONE 10 mg Oral Q12H  . pantoprazole 40 mg Oral Daily  . polyethylene glycol 17 g Oral BID  . senna 1 tablet Oral BID  . sucralfate 1 g Oral TID WC & HS  . tamsulosin 0.8 mg Oral QPC supper  . traZODone 50 mg Oral QHS   Continuous Infusions:  . sodium chloride 100 mL/hr at 01/15/20 0139  . levofloxacin (LEVAQUIN) IV 500 mg (01/15/20 0659)   LOS: 2 days  Time spent: Fox Chase, MD  [facility]  01/15/2020, 10:42 AM

## 2020-01-17 ENCOUNTER — Inpatient Hospital Stay (HOSPITAL_COMMUNITY): Payer: Commercial Managed Care - PPO

## 2020-01-17 LAB — RENAL FUNCTION PANEL
Albumin: 2.8 g/dL — ABNORMAL LOW (ref 3.5–5.0)
Anion gap: 7 (ref 5–15)
BUN: 10 mg/dL (ref 8–23)
CO2: 29 mmol/L (ref 22–32)
Calcium: 8.6 mg/dL — ABNORMAL LOW (ref 8.9–10.3)
Chloride: 108 mmol/L (ref 98–111)
Creatinine, Ser: 0.61 mg/dL (ref 0.61–1.24)
GFR calc Af Amer: >60 mL/min (ref >60)
GFR calc non Af Amer: >60 mL/min (ref >60)
Glucose, Bld: 95 mg/dL (ref 70–99)
Phosphorus: 3.7 mg/dL (ref 2.5–4.6)
Potassium: 3.4 mmol/L — ABNORMAL LOW (ref 3.5–5.1)
Sodium: 144 mmol/L (ref 135–145)

## 2020-01-17 LAB — CBC WITH DIFFERENTIAL/PLATELET
Abs Immature Granulocytes: 0.01 10*3/uL (ref 0.00–0.07)
Basophils Absolute: 0 10*3/uL (ref 0.0–0.1)
Basophils Relative: 1 %
Eosinophils Absolute: 0.3 10*3/uL (ref 0.0–0.5)
Eosinophils Relative: 5 %
HCT: 37.3 % — ABNORMAL LOW (ref 39.0–52.0)
Hemoglobin: 12.3 g/dL — ABNORMAL LOW (ref 13.0–17.0)
Immature Granulocytes: 0 %
Lymphocytes Relative: 28 %
Lymphs Abs: 1.5 10*3/uL (ref 0.7–4.0)
MCH: 30.7 pg (ref 26.0–34.0)
MCHC: 33 g/dL (ref 30.0–36.0)
MCV: 93 fL (ref 80.0–100.0)
Monocytes Absolute: 0.5 10*3/uL (ref 0.1–1.0)
Monocytes Relative: 9 %
Neutro Abs: 3 10*3/uL (ref 1.7–7.7)
Neutrophils Relative %: 57 %
Platelets: 338 10*3/uL (ref 150–400)
RBC: 4.01 MIL/uL — ABNORMAL LOW (ref 4.22–5.81)
RDW: 12.4 % (ref 11.5–15.5)
WBC: 5.4 10*3/uL (ref 4.0–10.5)
nRBC: 0 % (ref 0.0–0.2)

## 2020-01-17 MED ORDER — ONDANSETRON 4 MG PO TBDP: 4.0000 mg | ORAL_TABLET | Freq: Three times a day (TID) | ORAL | Status: DC | PRN

## 2020-01-17 NOTE — Progress Notes (Signed)
Hospitalist progress note   Patient from home, Patient going home  dispo likely 24 hours p.o. reliably and no further fevers or chills  Louis Jordan [id number] DOB: Nov 30, 1956 DOA: 01/13/2020  PCP: Derrill Center., MD  Narrative:  89 white male MV prolapse RBB left knee injury-trauma service admission 10/31-11/09/2019 fall from ladder L1 burst fracture spinal stenosis cauda equina anterior shoulder dislocation left ankle trimalleolar fracture with neurogenic bladder-discharged to rehab readmitted 12/6 through 12/8 significant constipation, ileus, cystitis Seen in ED 1/28 nausea chills fever?  Catheter associated urinary infection had a bulge in his right lower quadrant-return to the ED 1/29 persistent nausea vomiting chronic low back pain-felt to have possible pyelonephritis Started on Levaquin  Data Reviewed:  Sodium 142 BUN/creatinine 14/0.6 calcium 8.6 total protein 5 Hemoglobin 10.6 platelet 263 WBC 4.4 Blood cultures from 1/29 pending CT showed circumferential bladder wall thickening and gas moderate stool burden stable postsurgical prior thoracolumbar fusion changes  Assessment & Plan:  Pyelonephritis ?pseudomonas  Chronic indwelling Foley last changed on 1/26-he was empirically treated with Cipro Changing to PO levaquin-overnight no fever-stop date N/v and likely reflux 2/2 to pyelo Started on Thursday-- Reglan = abd dicomfort--sod/c Soft diet this morning tells me sticking sensation Discussed with Dr. 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 McDiarmid of urology and will need outpatient follow-up regarding management has had ~ 5 Urinary infections since catheter  placed Continue Flomax 0.8 HF MV prolapse History of-no issues at this time Chronic LBBB Chronic pain Patient has de-escalated - oxycodone 5-10 every 6 as OxyContin every 12, gabapentin 600 3 times daily, Robaxin 500 4 times daily Tramadol 50 mg for constipation As above aggressive bowel regimen because of chronic pain continue MiraLAX 17 twice daily senna 8.6 twice daily Insomnia, depression started Cymbalta 60 daily for neuropathy on 01/09/20--, trazodone 50 at bedtime 1/31 start lithium 150 bid Pscyh saw patient- no SI concern--Rec's OP therapy Iron deficiency anemia Continue ferrous sulfate 325 daily a.m.    Subjective:  Some discomfort in throat after eating hamburger yesterday He is now aware how to somewhat better self manage but feels will going home He also tells me the symptoms are not just reflux is like something "different"   Consultants:   None as yet  Objective: Vitals:   01/16/20 0621 01/16/20 1348 01/16/20 2058 01/17/20 0550  BP: 120/69 138/86 122/89 135/76  Pulse: (!) 58 70 63 70  Resp: 15 19 18 18   Temp: 98.3 F (36.8 C) 98.6 F (37 C) 98 F (36.7 C) 98 F (36.7 C)  TempSrc: Oral Oral Oral Oral  SpO2: 90% 94% 94% (!) 88%  Weight:    64 kg  Height:        Intake/Output Summary (Last 24 hours) at 01/17/2020 1538 Last data filed at 01/17/2020 1500 Gross per 24 hour  Intake 2888.73 ml  Output 2350 ml  Net 538.73 ml   Filed Weights   01/17/20 0550  Weight: 64 kg    Examination:  Awake coherent no distress Anxious +  abdomen slightly tender in epigastrium No lower extremity edema S1-S2 no murmur ROM intact Neurologically intact no focal deficit  Scheduled Meds: . Chlorhexidine Gluconate Cloth  6 each Topical Daily  .  docusate sodium  100 mg Oral BID  . DULoxetine  60 mg Oral Daily  . enoxaparin (LOVENOX) injection  40 mg Subcutaneous Q24H  . feeding supplement (ENSURE ENLIVE)  237 mL Oral BID BM  . ferrous sulfate  325 mg Oral Q  breakfast  . gabapentin  600 mg Oral TID  . levofloxacin  500 mg Oral Daily  . lithium carbonate  150 mg Oral BID WC  . LORazepam  0.5 mg Oral BID  . methocarbamol  500 mg Oral QID  . multivitamin with minerals  1 tablet Oral Daily  . oxyCODONE  10 mg Oral Q12H  . pantoprazole  40 mg Oral Daily  . polyethylene glycol  17 g Oral BID  . senna  1 tablet Oral BID  . sucralfate  1 g Oral TID WC & HS  . tamsulosin  0.8 mg Oral QPC supper   Continuous Infusions: . sodium chloride 100 mL/hr at 01/17/20 1326     LOS: 4 days   Time spent: 73 Pleas Koch, MD [facility]  01/17/2020, 3:38 PM

## 2020-01-18 ENCOUNTER — Inpatient Hospital Stay (HOSPITAL_COMMUNITY): Payer: Commercial Managed Care - PPO

## 2020-01-18 DIAGNOSIS — R1084 Generalized abdominal pain: Secondary | ICD-10-CM

## 2020-01-18 DIAGNOSIS — F4321 Adjustment disorder with depressed mood: Secondary | ICD-10-CM

## 2020-01-18 DIAGNOSIS — T83511S Infection and inflammatory reaction due to indwelling urethral catheter, sequela: Secondary | ICD-10-CM

## 2020-01-18 LAB — CULTURE, BLOOD (ROUTINE X 2)
Culture: NO GROWTH
Culture: NO GROWTH
Culture: NO GROWTH
Special Requests: ADEQUATE

## 2020-01-18 LAB — CBC WITH DIFFERENTIAL/PLATELET
Abs Immature Granulocytes: 0.01 10*3/uL (ref 0.00–0.07)
Basophils Absolute: 0 10*3/uL (ref 0.0–0.1)
Basophils Relative: 1 %
Eosinophils Absolute: 0.3 10*3/uL (ref 0.0–0.5)
Eosinophils Relative: 7 %
HCT: 39.1 % (ref 39.0–52.0)
Hemoglobin: 12.7 g/dL — ABNORMAL LOW (ref 13.0–17.0)
Immature Granulocytes: 0 %
Lymphocytes Relative: 29 %
Lymphs Abs: 1.5 10*3/uL (ref 0.7–4.0)
MCH: 30.2 pg (ref 26.0–34.0)
MCHC: 32.5 g/dL (ref 30.0–36.0)
MCV: 92.9 fL (ref 80.0–100.0)
Monocytes Absolute: 0.5 10*3/uL (ref 0.1–1.0)
Monocytes Relative: 9 %
Neutro Abs: 2.8 10*3/uL (ref 1.7–7.7)
Neutrophils Relative %: 54 %
Platelets: 361 10*3/uL (ref 150–400)
RBC: 4.21 MIL/uL — ABNORMAL LOW (ref 4.22–5.81)
RDW: 12.6 % (ref 11.5–15.5)
WBC: 5.1 10*3/uL (ref 4.0–10.5)
nRBC: 0 % (ref 0.0–0.2)

## 2020-01-18 NOTE — Progress Notes (Signed)
Patient concerned about level of output. NT empted 3500 from foley bag at 6am. Bag had not been empted since before 10pm on 2/2. Passed on to day time nurse Autumn. Also educated patient to speak to provider about concern. Patient denies any other complaints at this time. Will continue  to monitor. Kamya Watling [employer]ity fundraiser.

## 2020-01-18 NOTE — Plan of Care (Signed)

## 2020-01-18 NOTE — Progress Notes (Signed)
PROGRESS NOTE    Louis Jordan    Code Status: Full Code  ZOX:[id number] DOB: 09-07-1956 DOA: 01/13/2020  PCP: [name]., MD    Hospital Summary  This is a 64 year old male with history of MV prolapse, RBD, status post recent traumatic injury from fall from ladder resulting in L1 burst fracture, cauda equina, anterior shoulder dislocation, neurogenic bladder, discharged to rehab with multiple readmissions since who presented on 1/29 with flulike symptoms and persistent nausea/vomiting with chronic low back pain thought to have catheter associated urinary tract infection.  Has since had CT abdomen pelvis showing circumferential bladder wall thickening, gas and moderate stool burden.  He has since been treated for Pseudomonas UTI in setting of chronic indwelling Foley catheter, last changed 1/26 and currently on Levaquin for pyelonephritis.  Of note, patient was seen by psychiatry for depression continued on Cymbalta.  Started on trazodone at bedtime and started lithium as well.   A & P   Principal Problem:   UTI (urinary tract infection) Active Problems:   S/P lumbar fusion   Fall from ladder   Cauda equina spinal cord injury (HCC)   Neurogenic bladder   Nausea & vomiting   Adjustment disorder with depressed mood   1. Pyelonephritis suspect secondary to Pseudomonas UTI in setting of chronic indwelling Foley catheter a. Foley last changed 1/26 b. Day 7 antibiotics. Will discontinue as patient is afebrile and without leukocytosis 2. Nausea and vomiting likely related to reflux and pyelonephritis, resolved a. Barium swallow/esophogram negative b. Can likely discharge with reglan and protonix if needed 3. Abdominal/low back pain, Likely musculoskeletal a. Abdominal ultrasound today unremarkable with the exception of new right sided pleural effusion (hemodynamically stable on room air) b. Improved with tramadol c. Continue PRN tramadol and add heating pad to low  back 4. Polyuria a. Likely from high volumes of IV fluids as well as Flomax b. Hold IV fluids c. Continue Flomax 5. Right sided pleural effusion a. Continue to monitor as not needing O2 6. Anxiety/depression/insomnia a. Continue Cymbalta and trazodone b. Hold lithium as patient feels this has worsened his polyuria though this is unlikely diabetes insipidus c. continue with reassurance  7. Hx of MV prolapse, stable 8. Chronic LBBB 9. Chronic pain  a. Continue current pain regimen b. Pt/ot eval c. Continue bowel regimen 10. Iron deficiency anemia a. Continue iron   DVT prophylaxis: lovenox Family Communication: Patient updated at bedside and has communicated with his wife who was at bedside when I called him later in the day Disposition Plan: remained inpatient today to get abdominal ultrasound which took much of the day for abdominal pain. Plan for discharge tomorrow  Consultants  Psych  Procedures  none  Antibiotics   Anti-infectives (From admission, onward)   Start     Dose/Rate Route Frequency Ordered Stop   01/16/20 1000  levofloxacin (LEVAQUIN) tablet 500 mg     500 mg Oral Daily 01/16/20 0802     01/14/20 0600  levofloxacin (LEVAQUIN) IVPB 500 mg  Status:  Discontinued     500 mg 100 mL/hr over 60 Minutes Intravenous Every 24 hours 01/13/20 2003 01/16/20 0802   01/13/20 1800  ciprofloxacin (CIPRO) IVPB 400 mg  Status:  Discontinued     400 mg 200 mL/hr over 60 Minutes Intravenous Every 12 hours 01/13/20 1746 01/13/20 2016           Subjective   States "My kidneys hurt, they have never hurt like this." Also having abdominal  pain. Complains of increased urination since being hospitalized. Concerned his increased urination could be from the lithium which he has stopped taking. No other events. Denies any other complaints.  Objective   Vitals:   01/17/20 1612 01/17/20 2100 01/18/20 0612 01/18/20 1212  BP: 128/78 (!) 161/92 (!) 162/92 (!) 154/85  Pulse: 80 62  66 63  Resp: 18 16 18 16   Temp: 98.6 F (37 C) 98.8 F (37.1 C) 98.3 F (36.8 C) 98.2 F (36.8 C)  TempSrc: Oral Oral Oral Oral  SpO2: 94% 95% 90% 95%  Weight:   61.8 kg   Height:       No intake or output data in the 24 hours ending 01/18/20 2028 Filed Weights   01/17/20 0550 01/18/20 0612  Weight: 64 kg 61.8 kg    Examination:  Physical Exam Vitals and nursing note reviewed.  Constitutional:      Appearance: He is not ill-appearing.  HENT:     Head: Normocephalic.     Mouth/Throat:     Mouth: Mucous membranes are moist.  Eyes:     Conjunctiva/sclera: Conjunctivae normal.  Cardiovascular:     Rate and Rhythm: Normal rate and regular rhythm.     Pulses: Normal pulses.  Pulmonary:     Effort: Pulmonary effort is normal. No respiratory distress.  Abdominal:     General: Abdomen is flat. Bowel sounds are normal. There is no distension.  Musculoskeletal:        General: No swelling or tenderness.     Right lower leg: No edema.     Left lower leg: No edema.  Neurological:     Mental Status: He is alert. Mental status is at baseline.  Psychiatric:        Behavior: Behavior normal.     Comments: anxious     Data Reviewed: I have personally reviewed following labs and imaging studies  CBC: Recent Labs  Lab 01/12/20 2317 01/13/20 1540 01/14/20 0415 01/17/20 0446 01/18/20 0424  WBC 9.9 6.7 4.4 5.4 5.1  NEUTROABS 9.4* 5.4  --  3.0 2.8  HGB 12.7* 11.9* 10.6* 12.3* 12.7*  HCT 38.7* 35.7* 33.1* 37.3* 39.1  MCV 94.2 94.2 95.7 93.0 92.9  PLT 335 294 263 338 361   Basic Metabolic Panel: Recent Labs  Lab 01/12/20 2317 01/13/20 1540 01/14/20 0415 01/17/20 0446  NA 138 141 142 144  K 3.8 4.0 3.6 3.4*  CL 98 102 107 108  CO2 29 31 29 29   GLUCOSE 137* 112* 91 95  BUN 12 14 14 10   CREATININE 0.84 0.78 0.65 0.61  CALCIUM 9.2 9.0 8.6* 8.6*  PHOS  --   --   --  3.7   GFR: Estimated Creatinine Clearance: 81.5 mL/min (by C-G formula based on SCr of 0.61  mg/dL). Liver Function Tests: Recent Labs  Lab 01/13/20 1540 01/14/20 0415 01/17/20 0446  AST 18 15  --   ALT 21 18  --   ALKPHOS 57 49  --   BILITOT 0.8 0.4  --   PROT 5.7* 5.0*  --   ALBUMIN 3.2* 2.7* 2.8*   Recent Labs  Lab 01/13/20 1540  LIPASE 14   No results for input(s): AMMONIA in the last 168 hours. Coagulation Profile: No results for input(s): INR, PROTIME in the last 168 hours. Cardiac Enzymes: No results for input(s): CKTOTAL, CKMB, CKMBINDEX, TROPONINI in the last 168 hours. BNP (last 3 results) No results for input(s): PROBNP in the last 8760 hours. HbA1C:  No results for input(s): HGBA1C in the last 72 hours. CBG: No results for input(s): GLUCAP in the last 168 hours. Lipid Profile: No results for input(s): CHOL, HDL, LDLCALC, TRIG, CHOLHDL, LDLDIRECT in the last 72 hours. Thyroid Function Tests: No results for input(s): TSH, T4TOTAL, FREET4, T3FREE, THYROIDAB in the last 72 hours. Anemia Panel: No results for input(s): VITAMINB12, FOLATE, FERRITIN, TIBC, IRON, RETICCTPCT in the last 72 hours. Sepsis Labs: Recent Labs  Lab 01/12/20 2317  LATICACIDVEN 0.7    Recent Results (from the past 240 hour(s))  Urine culture     Status: Abnormal   Collection Time: 01/12/20 11:17 PM   Specimen: Urine, Clean Catch  Result Value Ref Range Status   Specimen Description   Final    URINE, CLEAN CATCH Performed at [facility], [place] [address] [place], [place] [address]    Special Requests   Final    NONE Performed at [facility], [place] [address] [place], [place] [address]    Culture 60,000 COLONIES/mL PSEUDOMONAS AERUGINOSA (A)  Final   Report Status 01/15/2020 FINAL  Final   Organism ID, Bacteria PSEUDOMONAS AERUGINOSA (A)  Final      Susceptibility   Pseudomonas aeruginosa - MIC*    CEFTAZIDIME 4 SENSITIVE Sensitive     CIPROFLOXACIN 0.5 SENSITIVE Sensitive     GENTAMICIN 2 SENSITIVE Sensitive     IMIPENEM 2  SENSITIVE Sensitive     * 60,000 COLONIES/mL PSEUDOMONAS AERUGINOSA  SARS CORONAVIRUS 2 (TAT 6-24 HRS) Nasopharyngeal Nasopharyngeal Swab     Status: None   Collection Time: 01/12/20 11:18 PM   Specimen: Nasopharyngeal Swab  Result Value Ref Range Status   SARS Coronavirus 2 NEGATIVE NEGATIVE Final    Comment: (NOTE) SARS-CoV-2 target nucleic acids are NOT DETECTED. The SARS-CoV-2 RNA is generally detectable in upper and lower respiratory specimens during the acute phase of infection. Negative results do not preclude SARS-CoV-2 infection, do not rule out co-infections with other pathogens, and should not be used as the sole basis for treatment or other patient management decisions. Negative results must be combined with clinical observations, patient history, and epidemiological information. The expected result is Negative. Fact Sheet for Patients: SugarRoll.be Fact Sheet for Healthcare Providers: https://www.woods-mathews.com/ This test is not yet approved or cleared by the [place] FDA and  has been authorized for detection and/or diagnosis of SARS-CoV-2 by FDA under an Emergency Use Authorization (EUA). This EUA will remain  in effect (meaning this test can be used) for the duration of the COVID-19 declaration under Section 56 4(b)(1) of the Act, 21 U.S.C. section 360bbb-3(b)(1), unless the authorization is terminated or revoked sooner. Performed at [facility], [place] [address] [place], [place] [address]   Blood culture (routine x 2)     Status: None   Collection Time: 01/12/20 11:22 PM   Specimen: BLOOD  Result Value Ref Range Status   Specimen Description   Final    BLOOD BLOOD LEFT FOREARM Performed at [facility] [address], [place], [place] [address]    Special Requests   Final    BOTTLES DRAWN AEROBIC AND ANAEROBIC Blood Culture results may not be optimal due to an excessive volume of blood  received in culture bottles Performed at [facility] [address], [place], [place] [address]    Culture   Final    NO GROWTH 5 DAYS Performed at [facility], [place],  [place] [address]    Report Status 01/18/2020 FINAL  Final  Blood culture (routine x 2)     Status: None   Collection Time: 01/13/20  6:01 PM   Specimen: BLOOD RIGHT HAND  Result Value Ref Range Status   Specimen Description   Final    BLOOD RIGHT HAND Performed at [facility], [address] [place], [place] [address]    Special Requests   Final    BOTTLES DRAWN AEROBIC AND ANAEROBIC Blood Culture results may not be optimal due to an inadequate volume of blood received in culture bottles Performed at [facility], [address] [place], [place] [address]    Culture   Final    NO GROWTH 5 DAYS Performed at [facility], [address] [place], [place] [address]    Report Status 01/18/2020 FINAL  Final  Blood culture (routine x 2)     Status: None   Collection Time: 01/13/20  6:05 PM   Specimen: BLOOD LEFT HAND  Result Value Ref Range Status   Specimen Description   Final    BLOOD LEFT HAND Performed at [facility], [address] [place], [place] [address]    Special Requests   Final    BOTTLES DRAWN AEROBIC AND ANAEROBIC Blood Culture adequate volume Performed at [facility], [address] [place], [place] [address]    Culture   Final    NO GROWTH 5 DAYS Performed at [facility], [address] [place], [place] [address]    Report Status 01/18/2020 FINAL  Final         Radiology Studies: US Abdomen Complete  Result Date: 01/18/2020 CLINICAL DATA:  Abdominal pain EXAM: ABDOMEN ULTRASOUND COMPLETE COMPARISON:  01/13/2020 CT FINDINGS: Gallbladder: No gallstones or wall thickening visualized. No sonographic Murphy sign noted by sonographer. Common bile duct:  Diameter: 2.1 mm. Liver: No focal lesion identified. Within normal limits in parenchymal echogenicity. Portal vein is patent on color Doppler imaging with normal direction of blood flow towards the liver. IVC: No abnormality visualized. Pancreas: Visualized portion unremarkable. Spleen: Size and appearance within normal limits. Right Kidney: Length: 11.4 cm. Echogenicity within normal limits. No mass or hydronephrosis visualized. Left Kidney: Length: 12.5 cm. Echogenicity within normal limits. No mass or hydronephrosis visualized. Abdominal aorta: No aneurysm visualized. Other findings: Note is made of a right-sided pleural effusion. This is new from the prior CT examination. IMPRESSION: Right-sided pleural effusion new from the prior CT examination. No other focal abnormality is seen. Electronically Signed   By: Alcide CleverMark  Lukens M.D.   On: 01/18/2020 19:19   DG ESOPHAGUS W SINGLE CM (SOL OR THIN BA)  Result Date: 01/17/2020 CLINICAL DATA:  Inpatient. Dysphagia with episodes of midline lower chest pain radiating to the back. EXAM: ESOPHOGRAM/BARIUM SWALLOW TECHNIQUE: Single contrast examination was performed using  thin barium. FLUOROSCOPY TIME:  Fluoroscopy Time:  2 minutes 24 seconds Radiation Exposure Index (if provided by the fluoroscopic device): 35.1 mGy Number of Acquired Spot Images: 3 COMPARISON:  01/13/2020 CT abdomen/pelvis. FINDINGS: Examination mildly limited by patient mobility. Prone swallows were not attempted. Normal oral and pharyngeal phases of swallowing, with no laryngeal penetration or tracheobronchial aspiration. No significant barium retention in the pharynx. No evidence of pharyngeal mass, stricture or diverticulum. No evidence of cricopharyngeus muscle dysfunction. No hiatal hernia. Mild gastroesophageal reflux elicited to the level of the midthoracic esophagus with water siphon test. Grossly normal esophageal motility  on upright and supine views. Esophageal distensibility appears grossly  normal, with no discrete esophageal mass, stricture or ulcer detected. The swallowed 13 mm barium tablet became transiently lodged at the esophagogastric junction for several seconds, with subsequent clearance into the stomach with barium swallow. IMPRESSION: 1. Normal oral and pharyngeal phases of swallowing, with no laryngeal penetration or tracheobronchial aspiration. 2. Mild gastroesophageal reflux elicited.  No hiatal hernia. 3. No gross evidence of esophageal mass, stricture or ulcer on this limited inpatient single-contrast esophagram. Electronically Signed   By: Delbert Phenix M.D.   On: 01/17/2020 15:02        Scheduled Meds: . Chlorhexidine Gluconate Cloth  6 each Topical Daily  . docusate sodium  100 mg Oral BID  . DULoxetine  60 mg Oral Daily  . enoxaparin (LOVENOX) injection  40 mg Subcutaneous Q24H  . feeding supplement (ENSURE ENLIVE)  237 mL Oral BID BM  . ferrous sulfate  325 mg Oral Q breakfast  . gabapentin  600 mg Oral TID  . levofloxacin  500 mg Oral Daily  . lithium carbonate  150 mg Oral BID WC  . LORazepam  0.5 mg Oral BID  . methocarbamol  500 mg Oral QID  . multivitamin with minerals  1 tablet Oral Daily  . oxyCODONE  10 mg Oral Q12H  . pantoprazole  40 mg Oral Daily  . polyethylene glycol  17 g Oral BID  . senna  1 tablet Oral BID  . sucralfate  1 g Oral TID WC & HS  . tamsulosin  0.8 mg Oral QPC supper   Continuous Infusions: . sodium chloride 100 mL/hr at 01/17/20 1326     LOS: 5 days    Time spent: 33 minutes with over 50% of the time coordinating the patient's care    Jae Dire, DO [facility] Pager [phone]  If 7PM-7AM, please contact night-coverage [email or web address] Password [facility] 01/18/2020, 8:28 PM

## 2020-01-19 DIAGNOSIS — F419 Anxiety disorder, unspecified: Secondary | ICD-10-CM

## 2020-01-19 DIAGNOSIS — F329 Major depressive disorder, single episode, unspecified: Secondary | ICD-10-CM

## 2020-01-19 LAB — BASIC METABOLIC PANEL
Anion gap: 8 (ref 5–15)
BUN: 9 mg/dL (ref 8–23)
CO2: 32 mmol/L (ref 22–32)
Calcium: 8.9 mg/dL (ref 8.9–10.3)
Chloride: 101 mmol/L (ref 98–111)
Creatinine, Ser: 0.67 mg/dL (ref 0.61–1.24)
GFR calc Af Amer: >60 mL/min (ref >60)
GFR calc non Af Amer: >60 mL/min (ref >60)
Glucose, Bld: 92 mg/dL (ref 70–99)
Potassium: 3.7 mmol/L (ref 3.5–5.1)
Sodium: 141 mmol/L (ref 135–145)

## 2020-01-19 LAB — CBC
HCT: 38.8 % — ABNORMAL LOW (ref 39.0–52.0)
Hemoglobin: 12.7 g/dL — ABNORMAL LOW (ref 13.0–17.0)
MCH: 30.6 pg (ref 26.0–34.0)
MCHC: 32.7 g/dL (ref 30.0–36.0)
MCV: 93.5 fL (ref 80.0–100.0)
Platelets: 350 10*3/uL (ref 150–400)
RBC: 4.15 MIL/uL — ABNORMAL LOW (ref 4.22–5.81)
RDW: 12.7 % (ref 11.5–15.5)
WBC: 5.2 10*3/uL (ref 4.0–10.5)
nRBC: 0 % (ref 0.0–0.2)

## 2020-01-19 LAB — MAGNESIUM: Magnesium: 2 mg/dL (ref 1.7–2.4)

## 2020-01-19 MED ORDER — DULOXETINE HCL 30 MG PO CPEP: 60.0000 mg | ORAL_CAPSULE | Freq: Every day | ORAL | 5 refills | Status: DC

## 2020-01-19 MED ORDER — TRAZODONE HCL 50 MG PO TABS: 100.0000 mg | ORAL_TABLET | Freq: Every day | ORAL | 3 refills | Status: DC

## 2020-01-19 MED ORDER — PANTOPRAZOLE SODIUM 40 MG PO TBEC: 40.0000 mg | DELAYED_RELEASE_TABLET | Freq: Every day | ORAL | 0 refills | Status: DC

## 2020-01-19 MED ORDER — SUCRALFATE 1 GM/10ML PO SUSP: 1.0000 g | Freq: Three times a day (TID) | ORAL | 0 refills | Status: DC

## 2020-01-19 MED ORDER — LORAZEPAM 0.5 MG PO TABS: 0.5000 mg | ORAL_TABLET | Freq: Two times a day (BID) | ORAL | 0 refills | Status: DC

## 2020-01-19 NOTE — Discharge Summary (Signed)
Physician Discharge Summary  Louis Jordan ZOX:[id number] DOB: 11/27/1956 DOA: 01/13/2020  PCP: [name]., MD  Admit date: 01/13/2020 Discharge date: 01/19/2020   Code Status: Full Code  Admitted From: Home Discharged to: Home Home Health: No Equipment/Devices: None Discharge Condition: Stable  Recommendations for Outpatient Follow-up   1. Follow up with PCP in 1 week 2. Please follow up BMP/CBC  3. Patient will need ambulatory referral to psychology for depression management  Hospital Summary  This is a 64 year old male with history of MV prolapse, RBD, status post recent traumatic injury from fall from ladder resulting in L1 burst fracture, cauda equina, anterior shoulder dislocation, neurogenic bladder, discharged to rehab with multiple readmissions since who presented on 1/29 with flulike symptoms and persistent nausea/vomiting with chronic low back pain thought to have catheter associated urinary tract infection.  Has since had CT abdomen pelvis showing circumferential bladder wall thickening, gas and moderate stool burden.  He has since been treated for Pseudomonas complicated UTI in setting of chronic indwelling Foley catheter, last changed 1/26 and treated with Levaquin for pyelonephritis.  Of note, patient was seen by psychiatry for depression this admission continued on Cymbalta.  Started on trazodone at bedtime.  He was also initially started on lithium by the hospitalist which the patient was concerned for this leading to polyuria and this was discontinued.  A & P   Principal Problem:   UTI (urinary tract infection) Active Problems:   S/P lumbar fusion   Fall from ladder   Cauda equina spinal cord injury (HCC)   Neurogenic bladder   Nausea & vomiting   Adjustment disorder with depressed mood    1. Pyelonephritis secondary to Pseudomonas complicated UTI in setting of chronic indwelling Foley catheter a. Foley last changed 1/26 b. Completed 7 days of  antibiotics, initially with Cipro and transition to Levaquin, not discharged on antibiotics 2. Nausea and vomiting likely related to reflux and pyelonephritis, resolved a. Barium swallow/esophogram negative b. Discharged with Protonix and Carafate, reevaluate meds as outpatient 3. Abdominal/low back pain, Likely musculoskeletal a. Abdominal ultrasound unremarkable with the exception of new right sided pleural effusion (main hemodynamically stable on room air) b. Improved with tramadol c. Advised to eat small meals as had recurrence of pain post dinner d. Continue home as needed pain meds 4. Polyuria, Likely from high volumes of IV fluids as well as Flomax, resolved at discharge a. Patient initially concern for lithium-induced (I suspect concern for diabetes insipidus) although highly unlikely DI b. Continue Flomax 5. Right sided pleural effusion a. Continue to monitor as not needing O2 6. Anxiety/depression/insomnia a. Continue increased Cymbalta and increased trazodone b. Started on Ativan 0.5 mg twice daily and discharged with short supply, reevaluate as outpatient c. continue with reassurance  7. Hx of MV prolapse, stable 8. Chronic LBBB 9. Chronic pain  a. Continue current pain regimen and wean off opiates as tolerable b. Continue bowel regimen 10. Iron deficiency anemia a. Continue iron   Wife updated via phone  Consultants  . Psychiatry  Procedures  . None  Antibiotics   Anti-infectives (From admission, onward)   Start     Dose/Rate Route Frequency Ordered Stop   01/16/20 1000  levofloxacin (LEVAQUIN) tablet 500 mg  Status:  Discontinued     500 mg Oral Daily 01/16/20 0802 01/18/20 2044   01/14/20 0600  levofloxacin (LEVAQUIN) IVPB 500 mg  Status:  Discontinued     500 mg 100 mL/hr over 60 Minutes Intravenous Every 24  hours 01/13/20 2003 01/16/20 0802   01/13/20 1800  ciprofloxacin (CIPRO) IVPB 400 mg  Status:  Discontinued     400 mg 200 mL/hr over 60 Minutes  Intravenous Every 12 hours 01/13/20 1746 01/13/20 2016        Subjective  Seen and examined at bedside no acute distress resting comfortably.  States his abdominal pain has resolved although had recurrence after his meal last night.  Feels that his abdominal pain may be diet related.  He was advised to eat small frequent meals and continue with bowel regimen.  Otherwise states that he has no chest pain, palpitations, nausea or vomiting.  No shortness of breath.  Polyuria resolved. no other complaints.   Objective   Discharge Exam: Vitals:   01/19/20 0001 01/19/20 0645  BP: 133/80 139/88  Pulse: 60 64  Resp: 16 16  Temp: 98.2 F (36.8 C) 98.2 F (36.8 C)  SpO2: 93% 93%   Vitals:   01/18/20 0612 01/18/20 1212 01/19/20 0001 01/19/20 0645  BP: (!) 162/92 (!) 154/85 133/80 139/88  Pulse: 66 63 60 64  Resp: 18 16 16 16   Temp: 98.3 F (36.8 C) 98.2 F (36.8 C) 98.2 F (36.8 C) 98.2 F (36.8 C)  TempSrc: Oral Oral Oral Oral  SpO2: 90% 95% 93% 93%  Weight: 61.8 kg     Height:        Physical Exam Vitals and nursing note reviewed.  Constitutional:      Appearance: Normal appearance.  HENT:     Head: Normocephalic and atraumatic.     Nose: Nose normal.     Mouth/Throat:     Mouth: Mucous membranes are moist.  Eyes:     Conjunctiva/sclera: Conjunctivae normal.  Cardiovascular:     Rate and Rhythm: Normal rate and regular rhythm.  Pulmonary:     Effort: Pulmonary effort is normal.     Breath sounds: Normal breath sounds. No wheezing.  Abdominal:     General: Abdomen is flat.     Palpations: Abdomen is soft.  Genitourinary:    Comments: Foley catheter Musculoskeletal:        General: No swelling or tenderness.     Cervical back: No rigidity.  Skin:    Coloration: Skin is not jaundiced or pale.  Neurological:     General: No focal deficit present.     Mental Status: He is alert. Mental status is at baseline.  Psychiatric:        Mood and Affect: Mood normal.         Behavior: Behavior normal.       The results of significant diagnostics from this hospitalization (including imaging, microbiology, ancillary and laboratory) are listed below for reference.     Microbiology: Recent Results (from the past 240 hour(s))  Urine culture     Status: Abnormal   Collection Time: 01/12/20 11:17 PM   Specimen: Urine, Clean Catch  Result Value Ref Range Status   Specimen Description   Final    URINE, CLEAN CATCH Performed at [facility], [address], [place], [place] [address]    Special Requests   Final    NONE Performed at [facility], [address] [place] [employer] [place], [place] [address]    Culture 60,000 COLONIES/mL PSEUDOMONAS AERUGINOSA (A)  Final   Report Status 01/15/2020 FINAL  Final   Organism ID, Bacteria PSEUDOMONAS AERUGINOSA (A)  Final      Susceptibility   Pseudomonas aeruginosa - MIC*  CEFTAZIDIME 4 SENSITIVE Sensitive     CIPROFLOXACIN 0.5 SENSITIVE Sensitive     GENTAMICIN 2 SENSITIVE Sensitive     IMIPENEM 2 SENSITIVE Sensitive     * 60,000 COLONIES/mL PSEUDOMONAS AERUGINOSA  SARS CORONAVIRUS 2 (TAT 6-24 HRS) Nasopharyngeal Nasopharyngeal Swab     Status: None   Collection Time: 01/12/20 11:18 PM   Specimen: Nasopharyngeal Swab  Result Value Ref Range Status   SARS Coronavirus 2 NEGATIVE NEGATIVE Final    Comment: (NOTE) SARS-CoV-2 target nucleic acids are NOT DETECTED. The SARS-CoV-2 RNA is generally detectable in upper and lower respiratory specimens during the acute phase of infection. Negative results do not preclude SARS-CoV-2 infection, do not rule out co-infections with other pathogens, and should not be used as the sole basis for treatment or other patient management decisions. Negative results must be combined with clinical observations, patient history, and epidemiological information. The expected result is Negative. Fact Sheet for  Patients: HairSlick.no Fact Sheet for Healthcare Providers: quierodirigir.com This test is not yet approved or cleared by the [place] FDA and  has been authorized for detection and/or diagnosis of SARS-CoV-2 by FDA under an Emergency Use Authorization (EUA). This EUA will remain  in effect (meaning this test can be used) for the duration of the COVID-19 declaration under Section 56 4(b)(1) of the Act, 21 U.S.C. section 360bbb-3(b)(1), unless the authorization is terminated or revoked sooner. Performed at [facility], [address], [place], [place] [address]   Blood culture (routine x 2)     Status: None   Collection Time: 01/12/20 11:22 PM   Specimen: BLOOD  Result Value Ref Range Status   Specimen Description   Final    BLOOD BLOOD LEFT FOREARM Performed at [facility], [address], [place], [place] [address]    Special Requests   Final    BOTTLES DRAWN AEROBIC AND ANAEROBIC Blood Culture results may not be optimal due to an excessive volume of blood received in culture bottles Performed at [facility], [address], [place], [place] [address]    Culture   Final    NO GROWTH 5 DAYS Performed at [facility], [address], [place], [place] [address]    Report Status 01/18/2020 FINAL  Final  Blood culture (routine x 2)     Status: None   Collection Time: 01/13/20  6:01 PM   Specimen: BLOOD RIGHT HAND  Result Value Ref Range Status   Specimen Description   Final    BLOOD RIGHT HAND Performed at [facility], [address] [place], [place] [address]    Special Requests   Final    BOTTLES DRAWN AEROBIC AND ANAEROBIC Blood Culture results may not be optimal due to an inadequate volume of blood received in culture bottles Performed at [facility], [address] [place], [place] [address]    Culture   Final    NO  GROWTH 5 DAYS Performed at [facility], [address] [place], [place] [address]    Report Status 01/18/2020 FINAL  Final  Blood culture (routine x 2)     Status: None   Collection Time: 01/13/20  6:05 PM   Specimen: BLOOD LEFT HAND  Result Value Ref Range Status   Specimen Description   Final    BLOOD LEFT HAND Performed at [facility], [address] [place], [place] [address]    Special Requests  Final    BOTTLES DRAWN AEROBIC AND ANAEROBIC Blood Culture adequate volume Performed at [facility], [address] [place], [place] [address]    Culture   Final    NO GROWTH 5 DAYS Performed at [facility], [address] [place], [place] [address]    Report Status 01/18/2020 FINAL  Final     Labs: BNP (last 3 results) No results for input(s): BNP in the last 8760 hours. Basic Metabolic Panel: Recent Labs  Lab 01/12/20 2317 01/13/20 1540 01/14/20 0415 01/17/20 0446 01/19/20 0447  NA 138 141 142 144 141  K 3.8 4.0 3.6 3.4* 3.7  CL 98 102 107 108 101  CO2 29 31 29 29  32  GLUCOSE 137* 112* 91 95 92  BUN 12 14 14 10 9   CREATININE 0.84 0.78 0.65 0.61 0.67  CALCIUM 9.2 9.0 8.6* 8.6* 8.9  MG  --   --   --   --  2.0  PHOS  --   --   --  3.7  --    Liver Function Tests: Recent Labs  Lab 01/13/20 1540 01/14/20 0415 01/17/20 0446  AST 18 15  --   ALT 21 18  --   ALKPHOS 57 49  --   BILITOT 0.8 0.4  --   PROT 5.7* 5.0*  --   ALBUMIN 3.2* 2.7* 2.8*   Recent Labs  Lab 01/13/20 1540  LIPASE 14   No results for input(s): AMMONIA in the last 168 hours. CBC: Recent Labs  Lab 01/12/20 2317 01/12/20 2317 01/13/20 1540 01/14/20 0415 01/17/20 0446 01/18/20 0424 01/19/20 0447  WBC 9.9   < > 6.7 4.4 5.4 5.1 5.2  NEUTROABS 9.4*  --  5.4  --  3.0 2.8  --   HGB 12.7*   < > 11.9* 10.6* 12.3* 12.7* 12.7*  HCT 38.7*   < > 35.7* 33.1* 37.3* 39.1 38.8*  MCV 94.2   < > 94.2 95.7 93.0 92.9 93.5  PLT 335   < > 294 263  338 361 350   < > = values in this interval not displayed.   Cardiac Enzymes: No results for input(s): CKTOTAL, CKMB, CKMBINDEX, TROPONINI in the last 168 hours. BNP: Invalid input(s): POCBNP CBG: No results for input(s): GLUCAP in the last 168 hours. D-Dimer No results for input(s): DDIMER in the last 72 hours. Hgb A1c No results for input(s): HGBA1C in the last 72 hours. Lipid Profile No results for input(s): CHOL, HDL, LDLCALC, TRIG, CHOLHDL, LDLDIRECT in the last 72 hours. Thyroid function studies No results for input(s): TSH, T4TOTAL, T3FREE, THYROIDAB in the last 72 hours.  Invalid input(s): FREET3 Anemia work up No results for input(s): VITAMINB12, FOLATE, FERRITIN, TIBC, IRON, RETICCTPCT in the last 72 hours. Urinalysis    Component Value Date/Time   COLORURINE YELLOW 01/12/2020 2317   APPEARANCEUR TURBID (A) 01/12/2020 2317   LABSPEC 1.013 01/12/2020 2317   PHURINE 8.0 01/12/2020 2317   GLUCOSEU NEGATIVE 01/12/2020 2317   HGBUR SMALL (A) 01/12/2020 2317   BILIRUBINUR NEGATIVE 01/12/2020 2317   KETONESUR 5 (A) 01/12/2020 2317   PROTEINUR NEGATIVE 01/12/2020 2317   NITRITE NEGATIVE 01/12/2020 2317   LEUKOCYTESUR LARGE (A) 01/12/2020 2317   Sepsis Labs Invalid input(s): PROCALCITONIN,  WBC,  LACTICIDVEN Microbiology Recent Results (from the past 240 hour(s))  Urine culture     Status: Abnormal   Collection Time: 01/12/20 11:17 PM   Specimen: Urine, Clean Catch  Result Value Ref  Range Status   Specimen Description   Final    URINE, CLEAN CATCH Performed at [facility], [address] [place], [place] [address]    Special Requests   Final    NONE Performed at [facility], [address] [place], [place] [address]    Culture 60,000 COLONIES/mL PSEUDOMONAS AERUGINOSA (A)  Final   Report Status 01/15/2020 FINAL  Final   Organism ID, Bacteria PSEUDOMONAS AERUGINOSA (A)  Final      Susceptibility   Pseudomonas aeruginosa  - MIC*    CEFTAZIDIME 4 SENSITIVE Sensitive     CIPROFLOXACIN 0.5 SENSITIVE Sensitive     GENTAMICIN 2 SENSITIVE Sensitive     IMIPENEM 2 SENSITIVE Sensitive     * 60,000 COLONIES/mL PSEUDOMONAS AERUGINOSA  SARS CORONAVIRUS 2 (TAT 6-24 HRS) Nasopharyngeal Nasopharyngeal Swab     Status: None   Collection Time: 01/12/20 11:18 PM   Specimen: Nasopharyngeal Swab  Result Value Ref Range Status   SARS Coronavirus 2 NEGATIVE NEGATIVE Final    Comment: (NOTE) SARS-CoV-2 target nucleic acids are NOT DETECTED. The SARS-CoV-2 RNA is generally detectable in upper and lower respiratory specimens during the acute phase of infection. Negative results do not preclude SARS-CoV-2 infection, do not rule out co-infections with other pathogens, and should not be used as the sole basis for treatment or other patient management decisions. Negative results must be combined with clinical observations, patient history, and epidemiological information. The expected result is Negative. Fact Sheet for Patients: HairSlick.no Fact Sheet for Healthcare Providers: quierodirigir.com This test is not yet approved or cleared by the [place] FDA and  has been authorized for detection and/or diagnosis of SARS-CoV-2 by FDA under an Emergency Use Authorization (EUA). This EUA will remain  in effect (meaning this test can be used) for the duration of the COVID-19 declaration under Section 56 4(b)(1) of the Act, 21 U.S.C. section 360bbb-3(b)(1), unless the authorization is terminated or revoked sooner. Performed at [facility], [address] [place], [place] [address]   Blood culture (routine x 2)     Status: None   Collection Time: 01/12/20 11:22 PM   Specimen: BLOOD  Result Value Ref Range Status   Specimen Description   Final    BLOOD BLOOD LEFT FOREARM Performed at [facility], [address], [place], [place] [address]     Special Requests   Final    BOTTLES DRAWN AEROBIC AND ANAEROBIC Blood Culture results may not be optimal due to an excessive volume of blood received in culture bottles Performed at [facility], [address] [place], [place] [address]    Culture   Final    NO GROWTH 5 DAYS Performed at [facility], [address], [place], [place] [address]    Report Status 01/18/2020 FINAL  Final  Blood culture (routine x 2)     Status: None   Collection Time: 01/13/20  6:01 PM   Specimen: BLOOD RIGHT HAND  Result Value Ref Range Status   Specimen Description   Final    BLOOD RIGHT HAND Performed at [facility], [address], [place], [place] [address]    Special Requests   Final    BOTTLES DRAWN AEROBIC AND ANAEROBIC Blood Culture results may not be optimal due to an inadequate volume of blood received in culture bottles Performed at [facility], [address], [place], [place] [address]  Culture   Final    NO GROWTH 5 DAYS Performed at [facility], [address], [place], [place] [address]    Report Status 01/18/2020 FINAL  Final  Blood culture (routine x 2)     Status: None   Collection Time: 01/13/20  6:05 PM   Specimen: BLOOD LEFT HAND  Result Value Ref Range Status   Specimen Description   Final    BLOOD LEFT HAND Performed at [facility], [address], [place], [place] [address]    Special Requests   Final    BOTTLES DRAWN AEROBIC AND ANAEROBIC Blood Culture adequate volume Performed at [facility], [address] [place], [place] [address]    Culture   Final    NO GROWTH 5 DAYS Performed at [facility], [address], [place], [place] [address]    Report Status 01/18/2020 FINAL  Final    Discharge Instructions     Discharge Instructions    Diet - low sodium heart healthy   Complete by: As directed    Discharge instructions   Complete by: As  directed    You were seen and examined in the hospital for urinary tract infection and cared for by a hospitalist.   Upon Discharge:  - increase your cymbalta to 60 mg daily (two tablets daily) - Set up an appointment with a psychologist - increase trazodone to 100 mg at night (two tablets) - start taking protonix 40 mg, 30 minutes prior to breakfast on an empty stomach - start taking carafate four times daily with meals and at bedtime  - take ativan 0.5 mg twice daily for anxiety - Make an appointment with your primary care physician within 7 days - Get lab work prior to your follow up appointment with your PCP Bring all home medications to your appointment to review Request that your primary physician go over all hospital tests and procedures/radiological results at the follow up.   Please get all hospital records sent to your physician by signing a hospital release before you go home.     Read the complete instructions along with all the possible side effects for all the medicines you take and that have been prescribed to you. Take any new medicines after you have completely understood and accept all the possible adverse reactions/side effects.   If you have any questions about your discharge medications or the care you received while you were in the hospital, you can call the unit and asked to speak with the hospitalist on call. Once you are discharged, your primary care physician will handle any further medical issues. Please note that NO REFILLS for any discharge medications will be authorized, as it is imperative that you return to your primary care physician (or establish a relationship with a primary care physician if you do not have one) for your aftercare needs so that they can reassess your need for medications and monitor your lab values.   Do not drive, operate heavy machinery, perform activities at heights, swimming or participation in water activities or provide baby sitting  services if your were admitted for loss of consciousness/seizures or if you are on sedating medications including, but not limited to benzodiazepines, sleep medications, narcotic pain medications, etc., until you have been cleared to do so by a medical doctor.   Do not take more than prescribed medications.   Wear a seat belt while driving.  If you have smoked or chewed  Tobacco in the last 2 years please stop smoking; also stop any regular Alcohol and/or any Recreational drug use including marijuana.  If you experience worsening of your admission symptoms or develop shortness of breath, chest pain, suicidal or homicidal thoughts or experience a life threatening emergency, you must seek medical attention immediately by calling 911 or calling your PCP immediately.   Increase activity slowly   Complete by: As directed      Allergies as of 01/19/2020      Reactions   Penicillins Swelling   Did it involve swelling of the face/tongue/throat, SOB, or low BP? Y Did it involve sudden or severe rash/hives, skin peeling, or any reaction on the inside of your mouth or nose? Y Did you need to seek medical attention at a hospital or doctor's office? N When did it last happen?Decades ago. If all above answers are "NO", may proceed with cephalosporin use.      Medication List    STOP taking these medications   ciprofloxacin 250 MG tablet Commonly known as: CIPRO   enoxaparin 40 MG/0.4ML injection Commonly known as: LOVENOX     TAKE these medications   acetaminophen 650 MG CR tablet Commonly known as: TYLENOL Take 650 mg by mouth every 8 (eight) hours as needed for pain.   acetaminophen 325 MG tablet Commonly known as: TYLENOL Take 2 tablets (650 mg total) by mouth every 6 (six) hours as needed.   bisacodyl 10 MG suppository Commonly known as: DULCOLAX Place 1 suppository (10 mg total) rectally daily after supper. What changed:   when to take this  reasons to take this   docusate  sodium 100 MG capsule Commonly known as: COLACE Take 1 capsule (100 mg total) by mouth 2 (two) times daily.   docusate sodium 283 MG enema Commonly known as: ENEMEEZ Place 1 enema (283 mg total) rectally daily.   DULoxetine 30 MG capsule Commonly known as: Cymbalta Take 2 capsules (60 mg total) by mouth daily. Take 1 tab  daily x 1 week than 2 tabs (60 mg) daily -for nerve pain What changed: how much to take   ferrous sulfate 325 (65 FE) MG tablet Take 1 tablet (325 mg total) by mouth daily with breakfast.   gabapentin 600 MG tablet Commonly known as: NEURONTIN TAKE 1 TABLET(600 MG) BY MOUTH THREE TIMES DAILY What changed: See the new instructions.   hydrocortisone 2.5 % rectal cream Commonly known as: ANUSOL-HC Place rectally 3 (three) times daily. What changed:   how much to take  when to take this  reasons to take this   lidocaine 2 % jelly Commonly known as: XYLOCAINE Place 1 application into the urethra as needed (cathing).   LORazepam 0.5 MG tablet Commonly known as: ATIVAN Take 1 tablet (0.5 mg total) by mouth 2 (two) times daily.   methocarbamol 500 MG tablet Commonly known as: ROBAXIN Take 1 tablet (500 mg total) by mouth 4 (four) times daily.   MULTIVITAMIN ADULT PO Take 1 tablet by mouth daily.   ondansetron 4 MG tablet Commonly known as: Zofran Take 1 tablet (4 mg total) by mouth daily as needed for nausea or vomiting.   oxyCODONE 5 MG immediate release tablet Commonly known as: Roxicodone Take 2 tablets (10 mg total) by mouth 2 (two) times daily as needed for severe pain or breakthrough pain.   pantoprazole 40 MG tablet Commonly known as: PROTONIX Take 1 tablet (40 mg total) by mouth daily.   polyethylene glycol 17 g  packet Commonly known as: MIRALAX / GLYCOLAX Take 17 g by mouth 2 (two) times daily.   senna 8.6 MG Tabs tablet Commonly known as: SENOKOT Take 1 tablet (8.6 mg total) by mouth 2 (two) times daily.   sucralfate 1 GM/10ML  suspension Commonly known as: CARAFATE Take 10 mLs (1 g total) by mouth 4 (four) times daily -  with meals and at bedtime.   tamsulosin 0.4 MG Caps capsule Commonly known as: FLOMAX Take 2 capsules (0.8 mg total) by mouth daily after supper.   traMADol 50 MG tablet Commonly known as: ULTRAM Take 1 tablet (50 mg total) by mouth every 6 (six) hours as needed for moderate pain.   traZODone 50 MG tablet Commonly known as: DESYREL Take 2 tablets (100 mg total) by mouth at bedtime. What changed:   how much to take  Another medication with the same name was removed. Continue taking this medication, and follow the directions you see here.   Xtampza ER 9 MG C12a Generic drug: oxyCODONE ER Take 9 mg by mouth 2 (two) times daily.   Xtampza ER 9 MG C12a Generic drug: oxyCODONE ER Take 9 mg by mouth 2 (two) times daily.       Allergies  Allergen Reactions  . Penicillins Swelling    Did it involve swelling of the face/tongue/throat, SOB, or low BP? Y Did it involve sudden or severe rash/hives, skin peeling, or any reaction on the inside of your mouth or nose? Y Did you need to seek medical attention at a hospital or doctor's office? N When did it last happen?Decades ago. If all above answers are "NO", may proceed with cephalosporin use.     Time coordinating discharge: Over 30 minutes   SIGNED:   Jae DireJared E Shahram Alexopoulos, D.O. [facility] Pager: [phone]  01/19/2020, 1:02 PM

## 2020-01-19 NOTE — Progress Notes (Signed)
PT Screen Note  Patient Details Name: Louis Jordan MRN: [id number] DOB: February 11, 1956   PT orders received and chart reviewed.  Noted pt has discharge orders.  Spoke with nurse and she has issued d/c paperwork.  Spoke with pt and declines acute PT needs.  Reports at baseline prior to this hospital admission and has DME.  Pt is being seen by outpt PT and will continue this to progress mobility since his fall from ladder in November.  Louis Jordan, PT Acute Rehab Services Pager [phone] [facility] [phone] [facility] [phone]   Louis Jordan 01/19/2020, 12:08 PM

## 2020-01-28 ENCOUNTER — Other Ambulatory Visit: Payer: Self-pay | Admitting: Physical Medicine and Rehabilitation

## 2020-01-29 ENCOUNTER — Other Ambulatory Visit: Payer: Self-pay | Admitting: Physical Medicine and Rehabilitation

## 2020-01-30 ENCOUNTER — Other Ambulatory Visit: Payer: Self-pay | Admitting: Physical Medicine and Rehabilitation

## 2020-01-30 NOTE — Telephone Encounter (Signed)
Recieved electronic medication refill request for Promethegan suppositories.  No mention of them in previous note dated  01-09-2020.  Unsure if ok to refill, please advise.

## 2020-01-30 NOTE — Telephone Encounter (Signed)
Spoke with provider, she said yes to refill

## 2020-02-02 ENCOUNTER — Other Ambulatory Visit: Payer: Self-pay

## 2020-02-02 ENCOUNTER — Emergency Department (HOSPITAL_BASED_OUTPATIENT_CLINIC_OR_DEPARTMENT_OTHER): Payer: Commercial Managed Care - PPO

## 2020-02-02 ENCOUNTER — Encounter (HOSPITAL_BASED_OUTPATIENT_CLINIC_OR_DEPARTMENT_OTHER): Payer: Self-pay | Admitting: Emergency Medicine

## 2020-02-02 ENCOUNTER — Emergency Department (HOSPITAL_BASED_OUTPATIENT_CLINIC_OR_DEPARTMENT_OTHER)
Admission: EM | Admit: 2020-02-02 | Discharge: 2020-02-02 | Disposition: A | Discharge status: Home / Self Care | Payer: Commercial Managed Care - PPO | Attending: Emergency Medicine | Admitting: Emergency Medicine

## 2020-02-02 DIAGNOSIS — N390 Urinary tract infection, site not specified: Secondary | ICD-10-CM

## 2020-02-02 DIAGNOSIS — Z79891 Long term (current) use of opiate analgesic: Secondary | ICD-10-CM | POA: Insufficient documentation

## 2020-02-02 DIAGNOSIS — Z79899 Other long term (current) drug therapy: Secondary | ICD-10-CM | POA: Diagnosis not present

## 2020-02-02 DIAGNOSIS — R109 Unspecified abdominal pain: Secondary | ICD-10-CM

## 2020-02-02 DIAGNOSIS — K5900 Constipation, unspecified: Secondary | ICD-10-CM | POA: Diagnosis not present

## 2020-02-02 DIAGNOSIS — T83511A Infection and inflammatory reaction due to indwelling urethral catheter, initial encounter: Secondary | ICD-10-CM

## 2020-02-02 DIAGNOSIS — R110 Nausea: Secondary | ICD-10-CM | POA: Diagnosis not present

## 2020-02-02 LAB — COMPREHENSIVE METABOLIC PANEL
ALT: 12 U/L (ref 0–44)
AST: 15 U/L (ref 15–41)
Albumin: 3.8 g/dL (ref 3.5–5.0)
Alkaline Phosphatase: 64 U/L (ref 38–126)
Anion gap: 9 (ref 5–15)
BUN: 16 mg/dL (ref 8–23)
CO2: 29 mmol/L (ref 22–32)
Calcium: 9.2 mg/dL (ref 8.9–10.3)
Chloride: 100 mmol/L (ref 98–111)
Creatinine, Ser: 0.8 mg/dL (ref 0.61–1.24)
GFR calc Af Amer: >60 mL/min (ref >60)
GFR calc non Af Amer: >60 mL/min (ref >60)
Glucose, Bld: 108 mg/dL — ABNORMAL HIGH (ref 70–99)
Potassium: 4 mmol/L (ref 3.5–5.1)
Sodium: 138 mmol/L (ref 135–145)
Total Bilirubin: 0.9 mg/dL (ref 0.3–1.2)
Total Protein: 6.3 g/dL — ABNORMAL LOW (ref 6.5–8.1)

## 2020-02-02 LAB — CBC WITH DIFFERENTIAL/PLATELET
Abs Immature Granulocytes: 0.01 10*3/uL (ref 0.00–0.07)
Basophils Absolute: 0 10*3/uL (ref 0.0–0.1)
Basophils Relative: 1 %
Eosinophils Absolute: 0.1 10*3/uL (ref 0.0–0.5)
Eosinophils Relative: 2 %
HCT: 41 % (ref 39.0–52.0)
Hemoglobin: 13.2 g/dL (ref 13.0–17.0)
Immature Granulocytes: 0 %
Lymphocytes Relative: 16 %
Lymphs Abs: 1.4 10*3/uL (ref 0.7–4.0)
MCH: 30.1 pg (ref 26.0–34.0)
MCHC: 32.2 g/dL (ref 30.0–36.0)
MCV: 93.6 fL (ref 80.0–100.0)
Monocytes Absolute: 0.4 10*3/uL (ref 0.1–1.0)
Monocytes Relative: 4 %
Neutro Abs: 6.9 10*3/uL (ref 1.7–7.7)
Neutrophils Relative %: 77 %
Platelets: 404 10*3/uL — ABNORMAL HIGH (ref 150–400)
RBC: 4.38 MIL/uL (ref 4.22–5.81)
RDW: 12.9 % (ref 11.5–15.5)
WBC: 8.8 10*3/uL (ref 4.0–10.5)
nRBC: 0 % (ref 0.0–0.2)

## 2020-02-02 LAB — URINALYSIS, ROUTINE W REFLEX MICROSCOPIC
Bilirubin Urine: NEGATIVE
Glucose, UA: NEGATIVE mg/dL
Ketones, ur: NEGATIVE mg/dL
Nitrite: POSITIVE — AB
Protein, ur: NEGATIVE mg/dL
Specific Gravity, Urine: <1.005 — ABNORMAL LOW (ref 1.005–1.030)
pH: 6 (ref 5.0–8.0)

## 2020-02-02 LAB — URINALYSIS, MICROSCOPIC (REFLEX)

## 2020-02-02 LAB — LACTIC ACID, PLASMA: Lactic Acid, Venous: 1.2 mmol/L (ref 0.5–1.9)

## 2020-02-02 LAB — LIPASE, BLOOD: Lipase: 19 U/L (ref 11–51)

## 2020-02-02 MED ORDER — ONDANSETRON HCL 4 MG/2ML IJ SOLN
4.0000 mg | Freq: Once | INTRAMUSCULAR | Status: AC
  Administered 2020-02-02: 16:00:00 4 mg via INTRAVENOUS
  Filled 2020-02-02: qty 2

## 2020-02-02 MED ORDER — CIPROFLOXACIN IN D5W 400 MG/200ML IV SOLN
400.0000 mg | Freq: Once | INTRAVENOUS | Status: AC
  Administered 2020-02-02: 18:00:00 400 mg via INTRAVENOUS
  Filled 2020-02-02: qty 200

## 2020-02-02 MED ORDER — OXYMETAZOLINE HCL 0.05 % NA SOLN
1.0000 | Freq: Once | NASAL | Status: DC
  Filled 2020-02-02: qty 30

## 2020-02-02 MED ORDER — ONDANSETRON 4 MG PO TBDP: 4.0000 mg | ORAL_TABLET | Freq: Three times a day (TID) | ORAL | 0 refills | Status: DC | PRN

## 2020-02-02 MED ORDER — LEVOFLOXACIN 500 MG PO TABS: 500.0000 mg | ORAL_TABLET | Freq: Every day | ORAL | 0 refills | Status: DC

## 2020-02-02 MED ORDER — SODIUM CHLORIDE 0.9 % IV SOLN
INTRAVENOUS | Status: DC | PRN
  Administered 2020-02-02: 18:00:00 250 mL via INTRAVENOUS

## 2020-02-02 MED ORDER — IOHEXOL 300 MG/ML  SOLN
100.0000 mL | Freq: Once | INTRAMUSCULAR | Status: AC | PRN
  Administered 2020-02-02: 100 mL via INTRAVENOUS

## 2020-02-02 MED ORDER — HYDROMORPHONE HCL 1 MG/ML IJ SOLN
1.0000 mg | Freq: Once | INTRAMUSCULAR | Status: AC
  Administered 2020-02-02: 16:00:00 1 mg via INTRAVENOUS
  Filled 2020-02-02: qty 1

## 2020-02-02 NOTE — ED Triage Notes (Signed)
Pt states he notice a knot on his abdomen today.  No constipation, no fever, no diarrhea.  Denies any injury.

## 2020-02-02 NOTE — ED Notes (Signed)
Foley irrigated without resistance, no blood noted.

## 2020-02-02 NOTE — ED Provider Notes (Signed)
Pt signed out by PA McLean at shift change.  Pt is feeling "100%" better after pain and nausea meds.  He no longer has any lower abdomen swelling.  UA does show a UTI.  Last Cx sensitive to cipro, so he's given a dose of cipro in ED.  Pt's CT does show some constipation.  Pt has several medications at home to take for his constipation.  He is on a lot of pain meds, so we won't add any additional pain meds.  Pt does know to return if worse.  F/u with pcp.  Urine sent for cx.  CT:  IMPRESSION: 1. No acute abnormality. 2. Prominent stool in the colon. 3. No significant change in the previously described L1 vertebral body burst fracture with significant retropulsion of an upper fragment.    Jacalyn Lefevre, MD 02/02/20 1904

## 2020-02-02 NOTE — ED Provider Notes (Signed)
[facility] EMERGENCY DEPARTMENT Provider Note   CSN: [id number] Arrival date & time: 02/02/20  1522     History Chief Complaint  Patient presents with  . Abdominal Pain    Louis Jordan is a 64 y.o. male.  Patient is a 64 year old gentleman with recent spinal cord injury presenting to the emergency department for abdominal pain. Patient reports that Louis Jordan had a normal soft bowel movement earlier in the day and soon after Louis Jordan began to feel a bulge in the lower part of his belly. Reports that it is extremely painful and causing nausea. No history of the same in the past. Louis Jordan does have a Foley catheter in place and reports that has been draining normally without any problems. Louis Jordan reports that Louis Jordan does have some penile pain and irritation and was due to follow-up with his urologist to have the Foley removed but has not done so yet due to their office being closed today. Denies any fever, vomiting, diarrhea        Past Medical History:  Diagnosis Date  . Bundle branch block, right   . Complication of anesthesia   . Hand pain    mild OA bilateral  hands  . Left knee injury 1970   requiring surgery for repair of ligaments/cartilage damage with nerve injury that took 20 years to resolve  . MVP (mitral valve prolapse)   . PONV (postoperative nausea and vomiting)     Patient Active Problem List   Diagnosis Date Noted  . Adjustment disorder with depressed mood 01/15/2020  . UTI (urinary tract infection) 01/13/2020  . Encounter for therapeutic drug monitoring 01/09/2020  . Nausea & vomiting 11/21/2019  . Ileus (HCC) 11/21/2019  . Hypoalbuminemia due to protein-calorie malnutrition (HCC)   . Acute blood loss anemia   . Postoperative pain   . Neuropathic pain   . Neurogenic bladder 10/26/2019  . Neurogenic bowel 10/26/2019  . Spinal cord injury, 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  . Shoulder dislocation, left, initial encounter   . Closed burst fracture of lumbar vertebra  (HCC)   . Cauda equina spinal cord injury (HCC)   . Closed fracture of left ankle   . Fall from ladder 10/22/2019  . 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 Closed displaced trimalleolar fracture of left ankle 10/22/2019  . S/P lumbar fusion 10/16/2019    Past Surgical History:  Procedure Laterality Date  . APPENDECTOMY    . KNEE SURGERY Right   . LUMBAR SPINE SURGERY  10/16/2019   L1 burst fracture with epidural hematoma and canal stenosis  . OPEN REDUCTION INTERNAL FIXATION (ORIF) TIBIA/FIBULA FRACTURE Left 10/19/2019   Procedure: OPEN REDUCTION INTERNAL FIXATION (ORIF) TIBIA/FIBULA FRACTURE;  Surgeon: Roby Lofts, MD;  Location: [facility] OR;  Service: Orthopedics;  Laterality: Left;  . SHOULDER ARTHROSCOPY WITH BANKART REPAIR Left 10/19/2019   Procedure: SHOULDER ARTHROSCOPY WITH BANKART REPAIR, EXTENSIVE DEBRIDEMENT, LOOSE BODY REMOVAL;  Surgeon: Bjorn Pippin, MD;  Location: [facility] OR;  Service: Orthopedics;  Laterality: Left;  . STRABISMUS SURGERY    . TONSILLECTOMY         Family History  Problem Relation Age of Onset  . Heart disease Mother   . Alzheimer's disease Mother   . Pancreatic cancer Father   . High blood pressure Brother     Social History   Tobacco Use  . Smoking status: Never Smoker  . Smokeless tobacco: Never Used  Substance Use Topics  . Alcohol use: Yes  Comment: RARE  . Drug use: Never    Home Medications Prior to Admission medications   Medication Sig Start Date End Date Taking? Authorizing Provider  acetaminophen (TYLENOL) 325 MG tablet Take 2 tablets (650 mg total) by mouth every 6 (six) hours as needed. Patient not taking: Reported on 01/13/2020 11/08/19   Love, Evlyn Kanner, PA-C  acetaminophen (TYLENOL) 650 MG CR tablet Take 650 mg by mouth every 8 (eight) hours as needed for pain.    [provider]  bisacodyl (DULCOLAX) 10 MG suppository Place 1 suppository (10 mg total) rectally daily after supper. Patient taking  differently: Place 10 mg rectally daily as needed for moderate constipation.  11/08/19   Love, Evlyn Kanner, PA-C  docusate sodium (COLACE) 100 MG capsule Take 1 capsule (100 mg total) by mouth 2 (two) times daily. 11/08/19   Love, Evlyn Kanner, PA-C  docusate sodium (ENEMEEZ) 283 MG enema Place 1 enema (283 mg total) rectally daily. Patient not taking: Reported on 01/13/2020 12/02/19   Horton Chin, MD  DULoxetine (CYMBALTA) 30 MG capsule Take 2 capsules (60 mg total) by mouth daily. Take 1 tab  daily x 1 week than 2 tabs (60 mg) daily -for nerve pain 01/19/20   Jae Dire, MD  ferrous sulfate 325 (65 FE) MG tablet Take 1 tablet (325 mg total) by mouth daily with breakfast. 11/08/19   Love, Evlyn Kanner, PA-C  gabapentin (NEURONTIN) 600 MG tablet TAKE 1 TABLET(600 MG) BY MOUTH THREE TIMES DAILY Patient taking differently: Take 600 mg by mouth 3 (three) times daily.  12/12/19   Raulkar, Drema Pry, MD  hydrocortisone (ANUSOL-HC) 2.5 % rectal cream Place rectally 3 (three) times daily. Patient taking differently: Place 1 application rectally 3 (three) times daily as needed for hemorrhoids.  11/08/19   Love, Evlyn Kanner, PA-C  lidocaine (XYLOCAINE) 2 % jelly Place 1 application into the urethra as needed (cathing). 11/08/19   Love, Evlyn Kanner, PA-C  LORazepam (ATIVAN) 0.5 MG tablet Take 1 tablet (0.5 mg total) by mouth 2 (two) times daily. 01/19/20   Jae Dire, MD  methocarbamol (ROBAXIN) 500 MG tablet Take 1 tablet (500 mg total) by mouth 4 (four) times daily. 11/28/19   Lovorn, Aundra Millet, MD  Multiple Vitamins-Minerals (MULTIVITAMIN ADULT PO) Take 1 tablet by mouth daily.    [provider]  ondansetron (ZOFRAN) 4 MG tablet Take 1 tablet (4 mg total) by mouth daily as needed for nausea or vomiting. 01/09/20 01/08/21  Lovorn, Aundra Millet, MD  oxyCODONE (ROXICODONE) 5 MG immediate release tablet Take 2 tablets (10 mg total) by mouth 2 (two) times daily as needed for severe pain or breakthrough pain. 01/09/20    Lovorn, Aundra Millet, MD  oxyCODONE ER (XTAMPZA ER) 9 MG C12A Take 9 mg by mouth 2 (two) times daily. 11/28/19   Lovorn, Aundra Millet, MD  oxyCODONE ER (XTAMPZA ER) 9 MG C12A Take 9 mg by mouth 2 (two) times daily. 01/13/20   Lovorn, Aundra Millet, MD  pantoprazole (PROTONIX) 40 MG tablet Take 1 tablet (40 mg total) by mouth daily. 01/19/20   Jae Dire, MD  polyethylene glycol (MIRALAX / GLYCOLAX) 17 g packet Take 17 g by mouth 2 (two) times daily. 11/08/19   Love, Pamela S, PA-C  PROMETHEGAN 25 MG suppository UNWRAP AND INSERT 1 SUPPOSITORY(25 MG) RECTALLY EVERY 6 HOURS AS NEEDED FOR NAUSEA OR VOMITING 01/30/20   Lovorn, Aundra Millet, MD  PROMETHEGAN 25 MG suppository UNWRAP AND INSERT 1 SUPPOSITORY(25 MG) RECTALLY EVERY 6 HOURS  AS NEEDED FOR NAUSEA OR VOMITING 01/30/20   Lovorn, Aundra Millet, MD  senna (SENOKOT) 8.6 MG TABS tablet Take 1 tablet (8.6 mg total) by mouth 2 (two) times daily. 11/08/19   Love, Evlyn Kanner, PA-C  sucralfate (CARAFATE) 1 GM/10ML suspension Take 10 mLs (1 g total) by mouth 4 (four) times daily -  with meals and at bedtime. 01/19/20   Jae Dire, MD  tamsulosin (FLOMAX) 0.4 MG CAPS capsule Take 2 capsules (0.8 mg total) by mouth daily after supper. 11/08/19   Love, Evlyn Kanner, PA-C  traMADol (ULTRAM) 50 MG tablet Take 1 tablet (50 mg total) by mouth every 6 (six) hours as needed for moderate pain. 11/28/19   Lovorn, Aundra Millet, MD  traZODone (DESYREL) 50 MG tablet Take 2 tablets (100 mg total) by mouth at bedtime. 01/19/20   Jae Dire, MD    Allergies    Penicillins  Review of Systems   Review of Systems  Constitutional: Negative for chills and fever.  Respiratory: Negative for cough and shortness of breath.   Cardiovascular: Negative for chest pain.  Gastrointestinal: Positive for abdominal pain and nausea. Negative for constipation, diarrhea and rectal pain.  Genitourinary: Positive for flank pain. Negative for dysuria, frequency and hematuria.  Musculoskeletal: Positive for back pain.    Physical  Exam Updated Vital Signs BP (!) 179/100   Pulse 73   Temp 98.2 F (36.8 C) (Oral)   Resp 20   Ht 6' (1.829 m)   Wt 63.5 kg   SpO2 97%   BMI 18.99 kg/m   Physical Exam Vitals reviewed.  Constitutional:      General: Louis Jordan is not in acute distress.    Appearance: Louis Jordan is not ill-appearing, toxic-appearing or diaphoretic.  HENT:     Mouth/Throat:     Mouth: Mucous membranes are moist.  Cardiovascular:     Rate and Rhythm: Normal rate and regular rhythm.  Pulmonary:     Effort: Pulmonary effort is normal.  Abdominal:     General: Bowel sounds are normal.     Palpations: There is no pulsatile mass.     Tenderness: There is abdominal tenderness in the suprapubic area.     Comments: There is a large, firm area in the suprapubic region which is exquisitely tender  Skin:    General: Skin is warm.  Neurological:     Mental Status: Louis Jordan is alert.     ED Results / Procedures / Treatments   Labs (all labs ordered are listed, but only abnormal results are displayed) Labs Reviewed  CBC WITH DIFFERENTIAL/PLATELET  COMPREHENSIVE METABOLIC PANEL  LIPASE, BLOOD  URINALYSIS, ROUTINE W REFLEX MICROSCOPIC  LACTIC ACID, PLASMA  LACTIC ACID, PLASMA    EKG None  Radiology No results found.  Procedures Procedures (including critical care time)  Medications Ordered in ED Medications  HYDROmorphone (DILAUDID) injection 1 mg (has no administration in time range)  ondansetron (ZOFRAN) injection 4 mg (has no administration in time range)    ED Course  I have reviewed the triage vital signs and the nursing notes.  Pertinent labs & imaging results that were available during my care of the patient were reviewed by me and considered in my medical decision making (see chart for details).  Clinical Course as of Feb 03 1151  Thu Feb 02, 2020  1721 64 y/o gentleman with hix of spinal cord injury presenting with acute anterior abdominal pain after having bowl movement today. Initially on exam  patient with firm  and tender lower abdomen and in excruciating pain. After dilaudid patient without any pain and has normal abdominal exam. UA with signs of UTI. 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 Remaining labs reassuring.  This was Previous urine culture grew cipro sensitive pseudomonas so one dose of IV cipro given here in th ER. CT scan results pending. If negative, suspect abdominal wall spasms and patient can be d/c home. Case signed out to Dr. Gilford Raid due to change of shift.    [KM]    Clinical Course User Index [KM] Kristine Royal   MDM Rules/Calculators/A&P                       Final Clinical Impression(s) / ED Diagnoses Final diagnoses:  None    Rx / DC Orders ED Discharge Orders    None       Kristine Royal 02/04/20 1152    Isla Pence, MD 02/04/20 2031

## 2020-02-02 NOTE — Discharge Instructions (Addendum)
You were seen today for abdominal pain. Your blood work was reassuring. Your urine showed signs of urinary tract infection so we treated you with a dose of IV antibiotics and will send you home with oral antibiotics to take as well.

## 2020-02-04 ENCOUNTER — Emergency Department (HOSPITAL_BASED_OUTPATIENT_CLINIC_OR_DEPARTMENT_OTHER)
Admission: EM | Admit: 2020-02-04 | Discharge: 2020-02-05 | Disposition: A | Discharge status: Home / Self Care | Payer: Commercial Managed Care - PPO | Attending: Emergency Medicine | Admitting: Emergency Medicine

## 2020-02-04 ENCOUNTER — Other Ambulatory Visit: Payer: Self-pay

## 2020-02-04 DIAGNOSIS — Z79899 Other long term (current) drug therapy: Secondary | ICD-10-CM | POA: Insufficient documentation

## 2020-02-04 DIAGNOSIS — Z466 Encounter for fitting and adjustment of urinary device: Secondary | ICD-10-CM | POA: Insufficient documentation

## 2020-02-04 DIAGNOSIS — N319 Neuromuscular dysfunction of bladder, unspecified: Secondary | ICD-10-CM | POA: Diagnosis not present

## 2020-02-04 DIAGNOSIS — N390 Urinary tract infection, site not specified: Secondary | ICD-10-CM | POA: Diagnosis not present

## 2020-02-04 DIAGNOSIS — T839XXA Unspecified complication of genitourinary prosthetic device, implant and graft, initial encounter: Secondary | ICD-10-CM

## 2020-02-04 LAB — URINE CULTURE: Culture: >=100000 — AB

## 2020-02-04 MED ORDER — KETOROLAC TROMETHAMINE 60 MG/2ML IM SOLN
60.0000 mg | Freq: Once | INTRAMUSCULAR | Status: DC
  Filled 2020-02-04: qty 2

## 2020-02-04 NOTE — ED Triage Notes (Signed)
Pt states that he feels like his urinary catheter is not draining. Pt is being treated for a UTI for 2 days with antibiotics.

## 2020-02-05 ENCOUNTER — Encounter (HOSPITAL_BASED_OUTPATIENT_CLINIC_OR_DEPARTMENT_OTHER): Payer: Self-pay | Admitting: Emergency Medicine

## 2020-02-05 LAB — CBC WITH DIFFERENTIAL/PLATELET
Abs Immature Granulocytes: 0.01 10*3/uL (ref 0.00–0.07)
Basophils Absolute: 0 10*3/uL (ref 0.0–0.1)
Basophils Relative: 1 %
Eosinophils Absolute: 0.2 10*3/uL (ref 0.0–0.5)
Eosinophils Relative: 5 %
HCT: 36.3 % — ABNORMAL LOW (ref 39.0–52.0)
Hemoglobin: 12 g/dL — ABNORMAL LOW (ref 13.0–17.0)
Immature Granulocytes: 0 %
Lymphocytes Relative: 20 %
Lymphs Abs: 1.1 10*3/uL (ref 0.7–4.0)
MCH: 30.8 pg (ref 26.0–34.0)
MCHC: 33.1 g/dL (ref 30.0–36.0)
MCV: 93.3 fL (ref 80.0–100.0)
Monocytes Absolute: 0.4 10*3/uL (ref 0.1–1.0)
Monocytes Relative: 7 %
Neutro Abs: 3.6 10*3/uL (ref 1.7–7.7)
Neutrophils Relative %: 67 %
Platelets: 321 10*3/uL (ref 150–400)
RBC: 3.89 MIL/uL — ABNORMAL LOW (ref 4.22–5.81)
RDW: 13.2 % (ref 11.5–15.5)
WBC: 5.3 10*3/uL (ref 4.0–10.5)
nRBC: 0 % (ref 0.0–0.2)

## 2020-02-05 LAB — URINALYSIS, MICROSCOPIC (REFLEX)

## 2020-02-05 LAB — BASIC METABOLIC PANEL
Anion gap: 8 (ref 5–15)
BUN: 18 mg/dL (ref 8–23)
CO2: 31 mmol/L (ref 22–32)
Calcium: 8.8 mg/dL — ABNORMAL LOW (ref 8.9–10.3)
Chloride: 100 mmol/L (ref 98–111)
Creatinine, Ser: 0.81 mg/dL (ref 0.61–1.24)
GFR calc Af Amer: >60 mL/min (ref >60)
GFR calc non Af Amer: >60 mL/min (ref >60)
Glucose, Bld: 96 mg/dL (ref 70–99)
Potassium: 4 mmol/L (ref 3.5–5.1)
Sodium: 139 mmol/L (ref 135–145)

## 2020-02-05 LAB — URINALYSIS, ROUTINE W REFLEX MICROSCOPIC
Bilirubin Urine: NEGATIVE
Glucose, UA: NEGATIVE mg/dL
Ketones, ur: NEGATIVE mg/dL
Nitrite: POSITIVE — AB
Protein, ur: NEGATIVE mg/dL
Specific Gravity, Urine: 1.015 (ref 1.005–1.030)
pH: 7 (ref 5.0–8.0)

## 2020-02-05 MED ORDER — KETOROLAC TROMETHAMINE 30 MG/ML IJ SOLN
30.0000 mg | Freq: Once | INTRAMUSCULAR | Status: AC
  Administered 2020-02-05: 30 mg via INTRAVENOUS

## 2020-02-05 NOTE — ED Provider Notes (Signed)
[facility] EMERGENCY DEPARTMENT Provider Note   CSN: [id number] Arrival date & time: 02/04/20  2244     History Chief Complaint  Patient presents with  . Recurrent UTI    Louis Jordan is a 64 y.o. male.  The history is provided by the patient.  Illness Location:  Bladder catheter Quality:  Not draining Severity:  Severe Onset quality:  Gradual Timing:  Constant Progression:  Unchanged Chronicity:  Recurrent Context:  Indwelling foley placed by urology for neurogenic bladder.  Relieved by:  Nothing Worsened by:  Time Ineffective treatments:  None Associated symptoms: abdominal pain   Associated symptoms: no chest pain, no congestion, no cough, no diarrhea, no ear pain, no fatigue, no fever, no headaches, no loss of consciousness, no myalgias, no nausea, no rash, no rhinorrhea, no shortness of breath, no sore throat, no vomiting and no wheezing   Associated symptoms comment:  Pain from distention Risk factors:  Spinal cord injury, also just started antibiotics for a UTI.        Past Medical History:  Diagnosis Date  . Bundle branch block, right   . Complication of anesthesia   . Hand pain    mild OA bilateral  hands  . Left knee injury 1970   requiring surgery for repair of ligaments/cartilage damage with nerve injury that took 20 years to resolve  . MVP (mitral valve prolapse)   . PONV (postoperative nausea and vomiting)     Patient Active Problem List   Diagnosis Date Noted  . Adjustment disorder with depressed mood 01/15/2020  . UTI (urinary tract infection) 01/13/2020  . Encounter for therapeutic drug monitoring 01/09/2020  . Nausea & vomiting 11/21/2019  . Ileus (HCC) 11/21/2019  . Hypoalbuminemia due to protein-calorie malnutrition (HCC)   . Acute blood loss anemia   . Postoperative pain   . Neuropathic pain   . Neurogenic bladder 10/26/2019  . Neurogenic bowel 10/26/2019  . Spinal cord injury, 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  .  Shoulder dislocation, left, initial encounter   . Closed burst fracture of lumbar vertebra (HCC)   . Cauda equina spinal cord injury (HCC)   . Closed fracture of left ankle   . Fall from ladder 10/22/2019  . 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 Closed displaced trimalleolar fracture of left ankle 10/22/2019  . S/P lumbar fusion 10/16/2019    Past Surgical History:  Procedure Laterality Date  . APPENDECTOMY    . KNEE SURGERY Right   . LUMBAR SPINE SURGERY  10/16/2019   L1 burst fracture with epidural hematoma and canal stenosis  . OPEN REDUCTION INTERNAL FIXATION (ORIF) TIBIA/FIBULA FRACTURE Left 10/19/2019   Procedure: OPEN REDUCTION INTERNAL FIXATION (ORIF) TIBIA/FIBULA FRACTURE;  Surgeon: Roby Lofts, MD;  Location: [facility] OR;  Service: Orthopedics;  Laterality: Left;  . SHOULDER ARTHROSCOPY WITH BANKART REPAIR Left 10/19/2019   Procedure: SHOULDER ARTHROSCOPY WITH BANKART REPAIR, EXTENSIVE DEBRIDEMENT, LOOSE BODY REMOVAL;  Surgeon: Bjorn Pippin, MD;  Location: [facility] OR;  Service: Orthopedics;  Laterality: Left;  . STRABISMUS SURGERY    . TONSILLECTOMY         Family History  Problem Relation Age of Onset  . Heart disease Mother   . Alzheimer's disease Mother   . Pancreatic cancer Father   . High blood pressure Brother     Social History   Tobacco Use  . Smoking status: Never Smoker  . Smokeless tobacco: Never Used  Substance Use Topics  . Alcohol  use: Yes    Comment: RARE  . Drug use: Never    Home Medications Prior to Admission medications   Medication Sig Start Date End Date Taking? Authorizing Provider  acetaminophen (TYLENOL) 325 MG tablet Take 2 tablets (650 mg total) by mouth every 6 (six) hours as needed. Patient not taking: Reported on 01/13/2020 11/08/19   Love, Ivan Anchors, PA-C  acetaminophen (TYLENOL) 650 MG CR tablet Take 650 mg by mouth every 8 (eight) hours as needed for pain.    [provider]  bisacodyl (DULCOLAX) 10 MG  suppository Place 1 suppository (10 mg total) rectally daily after supper. Patient taking differently: Place 10 mg rectally daily as needed for moderate constipation.  11/08/19   Love, Ivan Anchors, PA-C  docusate sodium (COLACE) 100 MG capsule Take 1 capsule (100 mg total) by mouth 2 (two) times daily. 11/08/19   Love, Ivan Anchors, PA-C  docusate sodium (ENEMEEZ) 283 MG enema Place 1 enema (283 mg total) rectally daily. Patient not taking: Reported on 01/13/2020 12/02/19   Izora Ribas, MD  DULoxetine (CYMBALTA) 30 MG capsule Take 2 capsules (60 mg total) by mouth daily. Take 1 tab  daily x 1 week than 2 tabs (60 mg) daily -for nerve pain 01/19/20   Harold Hedge, MD  ferrous sulfate 325 (65 FE) MG tablet Take 1 tablet (325 mg total) by mouth daily with breakfast. 11/08/19   Love, Ivan Anchors, PA-C  gabapentin (NEURONTIN) 600 MG tablet TAKE 1 TABLET(600 MG) BY MOUTH THREE TIMES DAILY Patient taking differently: Take 600 mg by mouth 3 (three) times daily.  12/12/19   Raulkar, Clide Deutscher, MD  hydrocortisone (ANUSOL-HC) 2.5 % rectal cream Place rectally 3 (three) times daily. Patient taking differently: Place 1 application rectally 3 (three) times daily as needed for hemorrhoids.  11/08/19   Love, Ivan Anchors, PA-C  levofloxacin (LEVAQUIN) 500 MG tablet Take 1 tablet (500 mg total) by mouth daily. 02/02/20   Isla Pence, MD  lidocaine (XYLOCAINE) 2 % jelly Place 1 application into the urethra as needed (cathing). 11/08/19   Love, Ivan Anchors, PA-C  LORazepam (ATIVAN) 0.5 MG tablet Take 1 tablet (0.5 mg total) by mouth 2 (two) times daily. 01/19/20   Harold Hedge, MD  methocarbamol (ROBAXIN) 500 MG tablet Take 1 tablet (500 mg total) by mouth 4 (four) times daily. 11/28/19   Lovorn, Jinny Blossom, MD  Multiple Vitamins-Minerals (MULTIVITAMIN ADULT PO) Take 1 tablet by mouth daily.    [provider]  ondansetron (ZOFRAN ODT) 4 MG disintegrating tablet Take 1 tablet (4 mg total) by mouth every 8 (eight) hours as  needed. 02/02/20   Isla Pence, MD  ondansetron (ZOFRAN) 4 MG tablet Take 1 tablet (4 mg total) by mouth daily as needed for nausea or vomiting. 01/09/20 01/08/21  Lovorn, Jinny Blossom, MD  oxyCODONE (ROXICODONE) 5 MG immediate release tablet Take 2 tablets (10 mg total) by mouth 2 (two) times daily as needed for severe pain or breakthrough pain. 01/09/20   Lovorn, Jinny Blossom, MD  oxyCODONE ER (XTAMPZA ER) 9 MG C12A Take 9 mg by mouth 2 (two) times daily. 11/28/19   Lovorn, Jinny Blossom, MD  oxyCODONE ER (XTAMPZA ER) 9 MG C12A Take 9 mg by mouth 2 (two) times daily. 01/13/20   Lovorn, Jinny Blossom, MD  pantoprazole (PROTONIX) 40 MG tablet Take 1 tablet (40 mg total) by mouth daily. 01/19/20   Harold Hedge, MD  polyethylene glycol (MIRALAX / GLYCOLAX) 17 g packet Take 17 g by  mouth 2 (two) times daily. 11/08/19   Love, Evlyn Kanner, PA-C  PROMETHEGAN 25 MG suppository UNWRAP AND INSERT 1 SUPPOSITORY(25 MG) RECTALLY EVERY 6 HOURS AS NEEDED FOR NAUSEA OR VOMITING 01/30/20   Lovorn, Aundra Millet, MD  PROMETHEGAN 25 MG suppository UNWRAP AND INSERT 1 SUPPOSITORY(25 MG) RECTALLY EVERY 6 HOURS AS NEEDED FOR NAUSEA OR VOMITING 01/30/20   Lovorn, Aundra Millet, MD  senna (SENOKOT) 8.6 MG TABS tablet Take 1 tablet (8.6 mg total) by mouth 2 (two) times daily. 11/08/19   Love, Evlyn Kanner, PA-C  sucralfate (CARAFATE) 1 GM/10ML suspension Take 10 mLs (1 g total) by mouth 4 (four) times daily -  with meals and at bedtime. 01/19/20   Jae Dire, MD  tamsulosin (FLOMAX) 0.4 MG CAPS capsule Take 2 capsules (0.8 mg total) by mouth daily after supper. 11/08/19   Love, Evlyn Kanner, PA-C  traMADol (ULTRAM) 50 MG tablet Take 1 tablet (50 mg total) by mouth every 6 (six) hours as needed for moderate pain. 11/28/19   Lovorn, Aundra Millet, MD  traZODone (DESYREL) 50 MG tablet Take 2 tablets (100 mg total) by mouth at bedtime. 01/19/20   Jae Dire, MD    Allergies    Penicillins  Review of Systems   Review of Systems  Constitutional: Negative for fatigue and fever.  HENT:  Negative for congestion, ear pain, rhinorrhea and sore throat.   Eyes: Negative for visual disturbance.  Respiratory: Negative for cough, shortness of breath and wheezing.   Cardiovascular: Negative for chest pain.  Gastrointestinal: Positive for abdominal distention and abdominal pain. Negative for diarrhea, nausea and vomiting.  Genitourinary: Positive for difficulty urinating.  Musculoskeletal: Negative for myalgias.  Skin: Negative for rash.  Neurological: Negative for loss of consciousness and headaches.  Psychiatric/Behavioral: Negative for agitation.  All other systems reviewed and are negative.   Physical Exam Updated Vital Signs BP (!) 169/98 (BP Location: Left Arm)   Pulse 62   Temp 98.2 F (36.8 C) (Oral)   Resp 16   Ht 5\' 11"  (1.803 m)   Wt 63.5 kg   SpO2 97%   BMI 19.53 kg/m   Physical Exam Vitals and nursing note reviewed.  Constitutional:      General: He is not in acute distress.    Appearance: Normal appearance.  HENT:     Head: Normocephalic and atraumatic.     Nose: Nose normal.  Eyes:     Conjunctiva/sclera: Conjunctivae normal.     Pupils: Pupils are equal, round, and reactive to light.  Cardiovascular:     Rate and Rhythm: Normal rate and regular rhythm.     Pulses: Normal pulses.     Heart sounds: Normal heart sounds.  Pulmonary:     Effort: Pulmonary effort is normal.     Breath sounds: Normal breath sounds.  Abdominal:     General: Abdomen is flat. Bowel sounds are normal. There is no distension.     Tenderness: There is no abdominal tenderness. There is no guarding or rebound.  Musculoskeletal:        General: Normal range of motion.     Cervical back: Normal range of motion and neck supple.  Skin:    General: Skin is warm and dry.     Capillary Refill: Capillary refill takes less than 2 seconds.  Neurological:     General: No focal deficit present.     Mental Status: He is alert and oriented to person, place, and time.     Deep  Tendon  Reflexes: Reflexes normal.  Psychiatric:        Mood and Affect: Mood normal.        Behavior: Behavior normal.     ED Results / Procedures / Treatments   Labs (all labs ordered are listed, but only abnormal results are displayed) Results for orders placed or performed during the hospital encounter of 02/04/20  CBC with Differential/Platelet  Result Value Ref Range   WBC 5.3 4.0 - 10.5 K/uL   RBC 3.89 (L) 4.22 - 5.81 MIL/uL   Hemoglobin 12.0 (L) 13.0 - 17.0 g/dL   HCT 35.4 (L) 65.6 - 81.2 %   MCV 93.3 80.0 - 100.0 fL   MCH 30.8 26.0 - 34.0 pg   MCHC 33.1 30.0 - 36.0 g/dL   RDW 75.1 70.0 - 17.4 %   Platelets 321 150 - 400 K/uL   nRBC 0.0 0.0 - 0.2 %   Neutrophils Relative % 67 %   Neutro Abs 3.6 1.7 - 7.7 K/uL   Lymphocytes Relative 20 %   Lymphs Abs 1.1 0.7 - 4.0 K/uL   Monocytes Relative 7 %   Monocytes Absolute 0.4 0.1 - 1.0 K/uL   Eosinophils Relative 5 %   Eosinophils Absolute 0.2 0.0 - 0.5 K/uL   Basophils Relative 1 %   Basophils Absolute 0.0 0.0 - 0.1 K/uL   Immature Granulocytes 0 %   Abs Immature Granulocytes 0.01 0.00 - 0.07 K/uL  Basic metabolic panel  Result Value Ref Range   Sodium 139 135 - 145 mmol/L   Potassium 4.0 3.5 - 5.1 mmol/L   Chloride 100 98 - 111 mmol/L   CO2 31 22 - 32 mmol/L   Glucose, Bld 96 70 - 99 mg/dL   BUN 18 8 - 23 mg/dL   Creatinine, Ser 9.44 0.61 - 1.24 mg/dL   Calcium 8.8 (L) 8.9 - 10.3 mg/dL   GFR calc non Af Amer >60 >60 mL/min   GFR calc Af Amer >60 >60 mL/min   Anion gap 8 5 - 15  Urinalysis, Routine w reflex microscopic  Result Value Ref Range   Color, Urine YELLOW YELLOW   APPearance HAZY (A) CLEAR   Specific Gravity, Urine 1.015 1.005 - 1.030   pH 7.0 5.0 - 8.0   Glucose, UA NEGATIVE NEGATIVE mg/dL   Hgb urine dipstick TRACE (A) NEGATIVE   Bilirubin Urine NEGATIVE NEGATIVE   Ketones, ur NEGATIVE NEGATIVE mg/dL   Protein, ur NEGATIVE NEGATIVE mg/dL   Nitrite POSITIVE (A) NEGATIVE   Leukocytes,Ua MODERATE (A)  NEGATIVE  Urinalysis, Microscopic (reflex)  Result Value Ref Range   RBC / HPF 0-5 0 - 5 RBC/hpf   WBC, UA 21-50 0 - 5 WBC/hpf   Bacteria, UA FEW (A) NONE SEEN   Squamous Epithelial / LPF 0-5 0 - 5   WBC Clumps PRESENT    Ca Oxalate Crys, UA PRESENT    CT ABDOMEN PELVIS WO CONTRAST  Result Date: 01/13/2020 CLINICAL DATA:  Right lower quadrant abdominal pain with palpable bulge, question hernia EXAM: CT ABDOMEN AND PELVIS WITHOUT CONTRAST TECHNIQUE: Multidetector CT imaging of the abdomen and pelvis was performed following the standard protocol without IV contrast. COMPARISON:  CT 11/20/2019 FINDINGS: Lower chest: Atelectatic changes noted in the lung bases. Normal heart size. No pericardial effusion. Hepatobiliary: No focal liver abnormality is seen. No gallstones, gallbladder wall thickening, or biliary dilatation. Pancreas: Unremarkable. No pancreatic ductal dilatation or surrounding inflammatory changes. Spleen: Normal in size without focal abnormality.  Adrenals/Urinary Tract: Normal adrenal glands. No visible or contour deforming renal lesions. No urolithiasis or hydronephrosis. Urinary bladder is decompressed about an inflated Foley catheter with small volume high attenuation material in the bladder lumen. There is circumferential bladder wall thickening and luminal gas, nonspecific in the setting of instrumentation. Stomach/Bowel: Distal esophagus, stomach and duodenal sweep are unremarkable. No small bowel wall thickening or dilatation. No evidence of obstruction. The appendix is surgically absent. Moderate stool burden throughout the colon. No colonic thickening or dilatation. Vascular/Lymphatic: Atherosclerotic plaque within the normal caliber aorta. No suspicious or enlarged lymph nodes in the included lymphatic chains. Reproductive: The prostate and seminal vesicles are unremarkable. Other: No free fluid. No free air. Bowel or fat containing hernia seen in the right lower quadrant. No soft  tissue abnormality, fluid collection or other possible CT explanation for palpable abnormality. Musculoskeletal: Multilevel degenerative changes are present in the imaged portions of the spine. Long segment thoracolumbar fusion with transpedicular screws at T10, T11 and T12 as well as L2, L3 and L4. Fusion levels bridge a a multipart burst fracture deformity of L1 with approximately 60% height loss which is similar in appearance to comparison CT from 11/20/2019. No acute osseous abnormality or suspicious osseous lesion. IMPRESSION: 1. No visible hernia, lesion, fluid collection, or other possible CT explanation for palpable abnormality in the right lower quadrant. 2. No other CT explanation patient's symptoms. 3. Circumferential bladder wall thickening and luminal gas, nonspecific in the setting of instrumentation. Correlate with urinalysis to exclude cystitis. 4. Moderate stool burden throughout the colon. Correlate for constipation. 5. Stable postsurgical changes from prior thoracolumbar fusion spanning a an L1 burst fracture. No acute osseous abnormalities. Electronically Signed   By: Kreg ShropshirePrice  DeHay M.D.   On: 01/13/2020 01:19   US Abdomen Complete  Result Date: 01/18/2020 CLINICAL DATA:  Abdominal pain EXAM: ABDOMEN ULTRASOUND COMPLETE COMPARISON:  01/13/2020 CT FINDINGS: Gallbladder: No gallstones or wall thickening visualized. No sonographic Murphy sign noted by sonographer. Common bile duct: Diameter: 2.1 mm. Liver: No focal lesion identified. Within normal limits in parenchymal echogenicity. Portal vein is patent on color Doppler imaging with normal direction of blood flow towards the liver. IVC: No abnormality visualized. Pancreas: Visualized portion unremarkable. Spleen: Size and appearance within normal limits. Right Kidney: Length: 11.4 cm. Echogenicity within normal limits. No mass or hydronephrosis visualized. Left Kidney: Length: 12.5 cm. Echogenicity within normal limits. No mass or hydronephrosis  visualized. Abdominal aorta: No aneurysm visualized. Other findings: Note is made of a right-sided pleural effusion. This is new from the prior CT examination. IMPRESSION: Right-sided pleural effusion new from the prior CT examination. No other focal abnormality is seen. Electronically Signed   By: Alcide CleverMark  Lukens M.D.   On: 01/18/2020 19:19   CT ABDOMEN PELVIS W CONTRAST  Result Date: 02/02/2020 CLINICAL DATA:  Abdominal mass felt by the patient today. Abdominal distension. Previous appendectomy. EXAM: CT ABDOMEN AND PELVIS WITH CONTRAST TECHNIQUE: Multidetector CT imaging of the abdomen and pelvis was performed using the standard protocol following bolus administration of intravenous contrast. CONTRAST:  100mL OMNIPAQUE IOHEXOL 300 MG/ML  SOLN COMPARISON:  Complete abdomen ultrasound dated 01/18/2020. Abdomen and pelvis CT dated 01/13/2020. FINDINGS: Lower chest: Minimal bibasilar atelectasis/scarring. Normal sized heart. Hepatobiliary: No focal liver abnormality is seen. No gallstones, gallbladder wall thickening, or biliary dilatation. Pancreas: Unremarkable. No pancreatic ductal dilatation or surrounding inflammatory changes. Spleen: Normal in size without focal abnormality. Adrenals/Urinary Tract: Foley catheter in the urinary bladder with associated air in the bladder.  Mild diffuse bladder wall thickening. Unremarkable adrenal glands, kidneys and ureters. Stomach/Bowel: Prominent stool in the colon. Surgically absent appendix. Unremarkable stomach and small bowel. Vascular/Lymphatic: Atheromatous arterial calcifications without aneurysm. No enlarged lymph nodes. Reproductive: Prostate is unremarkable. Other: No abdominal wall hernia or abnormality. No abdominopelvic ascites. Musculoskeletal: No significant change in the previously described L1 vertebral body burst fracture with significant retropulsion of an upper fragment. Stable pedicle screw and rod fixation from the T10 to the L4 level. Stable lumbar and  lower thoracic spine degenerative changes. IMPRESSION: 1. No acute abnormality. 2. Prominent stool in the colon. 3. No significant change in the previously described L1 vertebral body burst fracture with significant retropulsion of an upper fragment. Electronically Signed   By: Beckie Salts M.D.   On: 02/02/2020 17:57   DG ESOPHAGUS W SINGLE CM (SOL OR THIN BA)  Result Date: 01/17/2020 CLINICAL DATA:  Inpatient. Dysphagia with episodes of midline lower chest pain radiating to the back. EXAM: ESOPHOGRAM/BARIUM SWALLOW TECHNIQUE: Single contrast examination was performed using  thin barium. FLUOROSCOPY TIME:  Fluoroscopy Time:  2 minutes 24 seconds Radiation Exposure Index (if provided by the fluoroscopic device): 35.1 mGy Number of Acquired Spot Images: 3 COMPARISON:  01/13/2020 CT abdomen/pelvis. FINDINGS: Examination mildly limited by patient mobility. Prone swallows were not attempted. Normal oral and pharyngeal phases of swallowing, with no laryngeal penetration or tracheobronchial aspiration. No significant barium retention in the pharynx. No evidence of pharyngeal mass, stricture or diverticulum. No evidence of cricopharyngeus muscle dysfunction. No hiatal hernia. Mild gastroesophageal reflux elicited to the level of the midthoracic esophagus with water siphon test. Grossly normal esophageal motility on upright and supine views. Esophageal distensibility appears grossly normal, with no discrete esophageal mass, stricture or ulcer detected. The swallowed 13 mm barium tablet became transiently lodged at the esophagogastric junction for several seconds, with subsequent clearance into the stomach with barium swallow. IMPRESSION: 1. Normal oral and pharyngeal phases of swallowing, with no laryngeal penetration or tracheobronchial aspiration. 2. Mild gastroesophageal reflux elicited.  No hiatal hernia. 3. No gross evidence of esophageal mass, stricture or ulcer on this limited inpatient single-contrast esophagram.  Electronically Signed   By: Delbert Phenix M.D.   On: 01/17/2020 15:02    Radiology No results found.  Procedures Procedures (including critical care time)  Medications Ordered in ED Medications  ketorolac (TORADOL) 30 MG/ML injection 30 mg (30 mg Intravenous Given 02/05/20 0030)    ED Course  I have reviewed the triage vital signs and the nursing notes.  Pertinent labs & imaging results that were available during my care of the patient were reviewed by me and considered in my medical decision making (see chart for details).    Levaquin is the right choice for antibiotic based on previous culture.  Catheter irrigated and 700 cc out.  No signs of acute renal failure.  No signs of sepsis.  Stable for discharge with follow up with urology.   Erikson Wragg was evaluated in Emergency Department on 02/05/2020 for the symptoms described in the history of present illness. He was evaluated in the context of the global COVID-19 pandemic, which necessitated consideration that the patient might be at risk for infection with the SARS-CoV-2 virus that causes COVID-19. Institutional protocols and algorithms that pertain to the evaluation of patients at risk for COVID-19 are in a state of rapid change based on information released by regulatory bodies including the CDC and federal and state organizations. These policies and algorithms were followed during  the patient's care in the ED.  Final Clinical Impression(s) / ED Diagnoses Final diagnoses:  Problem with Foley catheter, initial encounter (HCC)  Urinary tract infection without hematuria, site unspecified    Return for weakness, numbness, changes in vision or speech, fevers >100.4 unrelieved by medication, shortness of breath, intractable vomiting, or diarrhea, abdominal pain, Inability to tolerate liquids or food, cough, altered mental status or any concerns. No signs of systemic illness or infection. The patient is nontoxic-appearing on exam and  vital signs are within normal limits.   I have reviewed the triage vital signs and the nursing notes. Pertinent labs &imaging results that were available during my care of the patient were reviewed by me and considered in my medical decision making (see chart for details).  After history, exam, and medical workup I feel the patient has been appropriately medically screened and is safe for discharge home. Pertinent diagnoses were discussed with the patient. Patient was given return precautions   Lorian Yaun, MD 02/05/20 [id number]

## 2020-02-10 ENCOUNTER — Encounter
Payer: Commercial Managed Care - PPO | Attending: Physical Medicine and Rehabilitation | Admitting: Physical Medicine and Rehabilitation

## 2020-02-10 ENCOUNTER — Other Ambulatory Visit: Payer: Self-pay

## 2020-02-10 ENCOUNTER — Encounter: Payer: Self-pay | Admitting: Physical Medicine and Rehabilitation

## 2020-02-10 VITALS — BP 163/95 | HR 72 | Temp 97.5°F | Ht 72.0 in | Wt 143.8 lb

## 2020-02-10 DIAGNOSIS — Z5181 Encounter for therapeutic drug level monitoring: Secondary | ICD-10-CM | POA: Diagnosis present

## 2020-02-10 DIAGNOSIS — Z79899 Other long term (current) drug therapy: Secondary | ICD-10-CM | POA: Diagnosis present

## 2020-02-10 DIAGNOSIS — S82852S Displaced trimalleolar fracture of left lower leg, sequela: Secondary | ICD-10-CM | POA: Diagnosis not present

## 2020-02-10 DIAGNOSIS — S343XXA Injury of cauda equina, initial encounter: Secondary | ICD-10-CM | POA: Diagnosis not present

## 2020-02-10 DIAGNOSIS — S24103A Unspecified injury at T7-T10 level of thoracic spinal cord, initial encounter: Secondary | ICD-10-CM | POA: Diagnosis present

## 2020-02-10 DIAGNOSIS — T07XXXA Unspecified multiple injuries, initial encounter: Secondary | ICD-10-CM | POA: Diagnosis present

## 2020-02-10 DIAGNOSIS — N390 Urinary tract infection, site not specified: Secondary | ICD-10-CM

## 2020-02-10 DIAGNOSIS — S343XXS Injury of cauda equina, sequela: Secondary | ICD-10-CM | POA: Insufficient documentation

## 2020-02-10 DIAGNOSIS — G834 Cauda equina syndrome: Secondary | ICD-10-CM | POA: Diagnosis present

## 2020-02-10 DIAGNOSIS — M792 Neuralgia and neuritis, unspecified: Secondary | ICD-10-CM

## 2020-02-10 DIAGNOSIS — S43005A Unspecified dislocation of left shoulder joint, initial encounter: Secondary | ICD-10-CM

## 2020-02-10 DIAGNOSIS — S32001S Stable burst fracture of unspecified lumbar vertebra, sequela: Secondary | ICD-10-CM | POA: Diagnosis present

## 2020-02-10 DIAGNOSIS — N319 Neuromuscular dysfunction of bladder, unspecified: Secondary | ICD-10-CM | POA: Diagnosis present

## 2020-02-10 MED ORDER — TAPENTADOL HCL 75 MG PO TABS: 75.0000 mg | ORAL_TABLET | Freq: Four times a day (QID) | ORAL | 0 refills | Status: DC | PRN

## 2020-02-10 MED ORDER — LIDOCAINE 5 % EX PTCH: 1.0000 | MEDICATED_PATCH | CUTANEOUS | 5 refills | Status: DC

## 2020-02-10 NOTE — Progress Notes (Signed)
Subjective:    Patient ID: Louis Jordan, male    DOB: 03-Jul-1956, 64 y.o.   MRN: [id number]  HPI   Pt is a 64 yr old male with Cauda equina syndrome and neurogenic bladder, and bowel, and severe nerve pain for f/u.     Knot just below navel- was 1 spasm- went to ER_ was given a strong pain med- and within minutes before it was gone.    Is on 8th UTI since hospital.  Urology - Dr Bernerd Limbo- couldn't get catheter in- used scope- sphincter spasm Tried a few different catheters on him. Couldn't get it in.   Back to the foley- blocked up foley- leg bag backs up- doesn't flow into bag  Urology suggested Botox on bladder sphincter- and doing suprapubic catheter-   Supposed to go back in a couple of weeks to do the Suprapubic and botox.  Got CAM boot off ~ 1 month ago- end of January- WBAT-  Can now walk ~ up to 1/2 mile with increased pain in feet L>>R.   Does his living room laps to count how far he goes.   Out of sling- going to OT for it and getting ROM.   Can currently work 1.5 to 2 hours/day.  Can't drive to work due to meds- gets fuzzy from meds.   Would prefer to get back to work a little later- can sit at table for ~ 90-120 minutes and then has to lay back down.   Has cut oxycodone in 1/2- pain level from 6pm to midnight. Pain skyrockets! Taking 5 mg daily and Xtampa 1x/day.   Pain is in cheeks of buttocks-penis also hurting 24 hours/day and scrotum- has full sensation now and actually super sensitive.  Main pain is cone around buttocks- can now feel anus- inside cheek area and back of legs.    Duloxetine made him sick all day long- stopped it.  Taking Gabapentin; that's the only nerve pain medicine.  Pins and needles and burning on L foot.  Bottom of foot.  No significant calluses.   Pain Inventory Average Pain 5 Pain Right Now 5 My pain is dull and aching  In the last 24 hours, has pain interfered with the following? General activity 7 Relation with  others 7 Enjoyment of life 7 What TIME of day is your pain at its worst? evening Sleep (in general) Fair  Pain is worse with: walking and some activites Pain improves with: medication Relief from Meds: 7  Mobility walk without assistance walk with assistance use a cane how many minutes can you walk? 10 ability to climb steps?  no do you drive?  no  Function employed # of hrs/week 10  what is your job? [employer] disabled: date disabled 10/15/2019 I need assistance with the following:  meal prep, household duties and shopping  Neuro/Psych bladder control problems weakness tingling trouble walking  Prior Studies Any changes since last visit?  no x-rays CT/MRI  Physicians involved in your care Any changes since last visit?  no Primary care . Neurologist . Neurosurgeon . Orthopedist .   Family History  Problem Relation Age of Onset  . Heart disease Mother   . Alzheimer's disease Mother   . Pancreatic cancer Father   . High blood pressure Brother    Social History   Socioeconomic History  . Marital status: Married    Spouse name: Not on file  . Number of children: Not on file  . Years of education: Not  on file  . Highest education level: Not on file  Occupational History  . Not on file  Tobacco Use  . Smoking status: Never Smoker  . Smokeless tobacco: Never Used  Substance and Sexual Activity  . Alcohol use: Yes    Comment: RARE  . Drug use: Never  . Sexual activity: Not on file  Other Topics Concern  . Not on file  Social History Narrative  . Not on file   Social Determinants of Health   Financial Resource Strain:   . Difficulty of Paying Living Expenses: Not on file  Food Insecurity:   . Worried About Programme researcher, broadcasting/film/video in the Last Year: Not on file  . Ran Out of Food in the Last Year: Not on file  Transportation Needs:   . Lack of Transportation (Medical): Not on file  . Lack of Transportation (Non-Medical): Not on file  Physical Activity:     . Days of Exercise per Week: Not on file  . Minutes of Exercise per Session: Not on file  Stress:   . Feeling of Stress : Not on file  Social Connections:   . Frequency of Communication with Friends and Family: Not on file  . Frequency of Social Gatherings with Friends and Family: Not on file  . Attends Religious Services: Not on file  . Active Member of Clubs or Organizations: Not on file  . Attends Banker Meetings: Not on file  . Marital Status: Not on file   Past Surgical History:  Procedure Laterality Date  . APPENDECTOMY    . KNEE SURGERY Right   . LUMBAR SPINE SURGERY  10/16/2019   L1 burst fracture with epidural hematoma and canal stenosis  . OPEN REDUCTION INTERNAL FIXATION (ORIF) TIBIA/FIBULA FRACTURE Left 10/19/2019   Procedure: OPEN REDUCTION INTERNAL FIXATION (ORIF) TIBIA/FIBULA FRACTURE;  Surgeon: Roby Lofts, MD;  Location: [facility] OR;  Service: Orthopedics;  Laterality: Left;  . SHOULDER ARTHROSCOPY WITH BANKART REPAIR Left 10/19/2019   Procedure: SHOULDER ARTHROSCOPY WITH BANKART REPAIR, EXTENSIVE DEBRIDEMENT, LOOSE BODY REMOVAL;  Surgeon: Bjorn Pippin, MD;  Location: [facility] OR;  Service: Orthopedics;  Laterality: Left;  . STRABISMUS SURGERY    . TONSILLECTOMY     Past Medical History:  Diagnosis Date  . Bundle branch block, right   . Complication of anesthesia   . Hand pain    mild OA bilateral  hands  . Left knee injury 1970   requiring surgery for repair of ligaments/cartilage damage with nerve injury that took 20 years to resolve  . MVP (mitral valve prolapse)   . PONV (postoperative nausea and vomiting)    BP (!) 163/95   Pulse 72   Temp (!) 97.5 F (36.4 C)   Ht 6' (1.829 m)   Wt 143 lb 12.8 oz (65.2 kg)   SpO2 96%   BMI 19.50 kg/m   Opioid Risk Score:   Fall Risk Score:  `1  Depression screen PHQ 2/9  Depression screen PHQ 2/9 11/14/2019  Decreased Interest 0  Down, Depressed, Hopeless 2  PHQ - 2 Score 2  Altered sleeping 1   Tired, decreased energy 1  Change in appetite 0  Feeling bad or failure about yourself  3  Trouble concentrating 1  Moving slowly or fidgety/restless 3  Suicidal thoughts 0  PHQ-9 Score 11  Difficult doing work/chores Somewhat difficult    Review of Systems An entire ROS was completed and was negative except for HPI.  Objective:   Physical Exam  Awake, alert , appropriate, accompanied by wife, NAD No accessory muscle use and no JVD; palpable pulses MS: out of sling on LUE and out of CAM boot 5-/5 in B/L LEs - HF, KE, KF, DF and PF No clonus ; no hoffman's No increased tone in LEs Supersensitive to touch in L>R feet and head of penis.        Assessment & Plan:   Pt is a 64 yr old male with Cauda equina syndrome and neurogenic bladder, and bowel, and severe nerve pain for f/u.   1. Suggest Xtampza 9 mg BID-Continue- for now- don't reduce to 1x/day.  2. lidoderm 1 patch  12 hr son;12 hrs off; on L foot- since has failed other nerve pain meds.  3. Nucynta 75 mg PO q6 hours as needed- goal to reduce use of Oxycodone.  Has FAILED Gabapentin and Duloxetine- no results with Gabapentin and Duloxetine had N/V with it- needs NERVE PAIN coverage, and currently has severe nerve pain without current coverage- tried Lyrica in past without any results and had side effects-  Only option left is Nucynta, for SCI related and trauma related nerve pain.  4.  Failed Duloxetine- makes have N/vomiting.  5. F/U in 4 weeks.   I spent a total of 45 minutes and more than 30 minutes on appointment and discussing nerve pain.

## 2020-02-10 NOTE — Patient Instructions (Addendum)
Pt is a 64 yr old male with Cauda equina syndrome and neurogenic bladder, and bowel, and severe nerve pain for f/u.   1. Suggest Xtampza 9 mg BID-Continue- for now- don't reduce to 1x/day.  2. lidoderm 1 patch  12 hr son;12 hrs off; on L foot- since has failed other nerve pain meds 3. Nucynta 75 mg PO q6 hours as needed- goal to reduce use of Oxycodone.  Has FAILED Gabapentin and Duloxetine- no results with Gabapentin and Duloxetine had N/V with it- needs NERVE PAIN coverage, and currently has severe nerve pain without current coverage- tried Lyrica in past without any results and had side effects-  Only option left is Nucynta, for SCI related and trauma related nerve pain.  4.  Failed Duloxetine- makes have N/vomiting.  5. F/U in 4 weeks to f/u on urological issues.

## 2020-02-14 ENCOUNTER — Other Ambulatory Visit: Payer: Self-pay | Admitting: Urology

## 2020-02-20 ENCOUNTER — Telehealth: Payer: Self-pay | Admitting: *Deleted

## 2020-02-20 NOTE — Telephone Encounter (Signed)
Prior authorization for Nucynta  DENIED  Reason: medication is excluded from the plan

## 2020-02-21 MED ORDER — PREGABALIN 75 MG PO CAPS: 75.0000 mg | ORAL_CAPSULE | Freq: Two times a day (BID) | ORAL | 0 refills | Status: DC

## 2020-02-21 MED ORDER — PREGABALIN 150 MG PO CAPS: 150.0000 mg | ORAL_CAPSULE | Freq: Two times a day (BID) | ORAL | 3 refills | Status: DC

## 2020-02-21 NOTE — Telephone Encounter (Signed)
Nucynta had been denied.   Will try Lyrica 75 mg BID x 1 week then 150 mg BID- for nerve pain.  Will wait to wean Gabapentin until we know Lyrica is working.  Called in for pt.  Spoke to pt and let him know I was sending Rx in.

## 2020-02-22 ENCOUNTER — Other Ambulatory Visit: Payer: Self-pay

## 2020-02-22 ENCOUNTER — Encounter (HOSPITAL_BASED_OUTPATIENT_CLINIC_OR_DEPARTMENT_OTHER): Payer: Self-pay | Admitting: Urology

## 2020-02-22 NOTE — Progress Notes (Addendum)
Spoke w/ via phone for pre-op interview---Louis Jordan needs dos---- I STAT 8, ASK MDA DAY OF SURGERY IF NEEDED, HISTORY OF MILD MITRAL VALVE PROLAPSE , RBBB ON EKG           Jordan results------EKG 11-20-2019 EPIC COVID test ------02-24-2020 Arrive at -------900 am 02-28-2020 NPO after -----midnight- Medications to take morning of surgery -----ocycodone er, nucynta, lorazepam, gabapentin, pregabalin, methocarbamol, pantaprazole Diabetic medication -----n/a Patient Special Instructions ----- Pre-Op special Istructions ----- Patient verbalized understanding of instructions that were given at this phone interview. Patient denies shortness of breath, chest pain, fever, cough a this phone interview.

## 2020-02-24 ENCOUNTER — Other Ambulatory Visit (HOSPITAL_COMMUNITY): Payer: Commercial Managed Care - PPO

## 2020-02-24 ENCOUNTER — Other Ambulatory Visit (HOSPITAL_COMMUNITY)
Admission: RE | Admit: 2020-02-24 | Discharge: 2020-02-24 | Disposition: A | Discharge status: Home / Self Care | Payer: Commercial Managed Care - PPO | Source: Ambulatory Visit | Attending: Urology | Admitting: Urology

## 2020-02-24 DIAGNOSIS — Z01812 Encounter for preprocedural laboratory examination: Secondary | ICD-10-CM | POA: Diagnosis not present

## 2020-02-24 DIAGNOSIS — Z20822 Contact with and (suspected) exposure to covid-19: Secondary | ICD-10-CM | POA: Insufficient documentation

## 2020-02-24 LAB — SARS CORONAVIRUS 2 (TAT 6-24 HRS): SARS Coronavirus 2: NEGATIVE

## 2020-02-26 NOTE — H&P (Signed)
Incomplete bladder emptying stable. I reviewed the last note in detail dated January 24, 2020. He is on trimethoprim.  Patient went to the emergency room with a lower abdominal bulge and describes them thinking it was spasticity. He was given pain medicine and went away immediately. He was put on Levaquin and is just finishing it. Patient also describes going back to the emergency room with a blocked catheter and was taught how to irrigated. It blocked again yesterday at home any irrigated.   Culture in hospital came back as bacteria sensitive to ciprofloxacin but not Levaquin.   Clinically not infected today.   I had a number of catheters today. His catheter was removed. Urine sent for culture. I used a 84 [place] and 16 [place] Rusch coude and was able and/or the bladder but it had to give a lot of steady firm pressure as it would work through his sphincter. A 16 French clear nicely firm catheter would not pass initially. Another coude would not go in easily. He was very tender when I will go through the prostate. I entered the bladder 3 or 4 times but in my opinion he would not be able to do it himself and certainly not safely. Also external sphincter spasm likely to worsen over the next several hours   Urine rare bacteria sent for cultures   Fortunately a 16 [place] coude went in nicely and I irrigated the bladder for a few moments and was cloudy minimally.   Picture drawn. I recommend under general anesthesia suprapubic tube placed endoscopically. Off-label botulinum toxin of external sphincter was discussed. Speaking to neurologist or primary care for muscle spasm medication discussed. Need for suprapubic incision for suprapubic tube discussed but very unlikely. Pros cons wrist discussed including bowel injury was sequelae. Natural history of inability to catheterize was discussed and difficult to predict   The urine culture above is likely colonized. I think it is best that is treated but then he  stay off daily prophylaxis sodium cause resistance   We also talked about clamping with voiding trials. We talked about straight drainage certainly in the short-term and has a standard to care until track forms. I do not recommend this to be done with CT scan with him awake.   He would like to proceed and I will try to place a 16 French suprapubic tube and utilize likely 80 units of Botox off-label. Black box warning discussed   He get throat swelling from penicillin. I called in ciprofloxacin but will be more specific on antibiotics with the culture 10 days prior   Due to rare risk of suprapubic incision he will be done on a normal operating room table   Cystogram could also look at bladder neck competence./ patient will be told he can have neuro incontinence with or without botox     ALLERGIES: Penicillin    MEDICATIONS: Trimethoprim 100 mg tablet 1 tablet PO Daily  Colace  Flomax 0.4 mg capsule 1 capsule PO BID  Gabapentin  Iron  Miralax  Mobic 15 mg tablet 1 tablet PO Daily  Multivitamin  Oxycodone Hcl  Robaxin  Tramadol Hcl  Trazodone Hcl  Tylenol     GU PSH: Cystoscopy - 01/24/2020     NON-GU PSH: None   GU PMH: Nocturia - 12/02/2019 Urinary Retention - 12/02/2019    NON-GU PMH: None   FAMILY HISTORY: None   SOCIAL HISTORY: None   REVIEW OF SYSTEMS:    GU Review Male:   Patient  denies frequent urination, hard to postpone urination, burning/ pain with urination, get up at night to urinate, leakage of urine, stream starts and stops, trouble starting your stream, have to strain to urinate , erection problems, and penile pain.  Gastrointestinal (Upper):   Patient denies nausea, vomiting, and indigestion/ heartburn.  Gastrointestinal (Lower):   Patient denies diarrhea and constipation.  Constitutional:   Patient denies fever, night sweats, weight loss, and fatigue.  Skin:   Patient denies skin rash/ lesion and itching.  Eyes:   Patient denies blurred vision and  double vision.  Ears/ Nose/ Throat:   Patient denies sore throat and sinus problems.  Hematologic/Lymphatic:   Patient denies swollen glands and easy bruising.  Cardiovascular:   Patient denies leg swelling and chest pains.  Respiratory:   Patient denies cough and shortness of breath.  Endocrine:   Patient denies excessive thirst.  Musculoskeletal:   Patient denies back pain and joint pain.  Neurological:   Patient denies headaches and dizziness.  Psychologic:   Patient denies depression and anxiety.   VITAL SIGNS:      02/07/2020 09:57 AM  BP 158/89 mmHg  Pulse 77 /min  Temperature 97.8 F / 36.5 C   PAST DATA REVIEWED:  Source Of History:  Patient   PROCEDURES:          Urinalysis w/Scope Dipstick Dipstick Cont'd Micro  Color: Yellow Bilirubin: Neg mg/dL WBC/hpf: 6 - 10/hpf  Appearance: Clear Ketones: Neg mg/dL RBC/hpf: 3 - 10/hpf  Specific Gravity: 1.020 Blood: 1+ ery/uL Bacteria: Rare (0-9/hpf)  pH: <=5.0 Protein: Trace mg/dL Cystals: Ca Oxalate  Glucose: Neg mg/dL Urobilinogen: 0.2 mg/dL Casts: NS (Not Seen)    Nitrites: Neg Trichomonas: Not Present    Leukocyte Esterase: 2+ leu/uL Mucous: Not Present      Epithelial Cells: NS (Not Seen)      Yeast: NS (Not Seen)      Sperm: Not Present    Notes: MICROSCOPIC NOT CONCENTRATED.    ASSESSMENT:      ICD-10 Details  1 GU:   Urinary Retention - R33.8               Notes:   I drew him a picture and we talked about reconstructive surgery in detail. Pros, cons, general surgical and anesthetic risks, and other options including non-surgical therapies (dilation, CIC), and watchful waiting were discussed. He understands that surgery is successful in approximately 85% of cases. Failure rates and the risk of persistence/recurrence/and worsening of the problem were discussed. Surgical risks were described but not limited to the discussion of injury to neighboring structures including the bowel (with possible life-threatening sepsis  and colostomy) and leg/nerve injuries with sequelae. Bleeding risks, transfusion rates, and infection were discussed. The risks of perineal numbness, erectile dysfunction, and post-void dribbling were discussed. The usual post-operative course was described. The patient understands that he might not reach her treatment goal and that he might be worse following surgery.   We also talked about clamping with voiding trials. We talked about straight drainage certainly in the short-term and has a standard to care until track forms. I do not recommend this to be done with CT scan with him awake.   After a thorough review of the management options for the patient's condition the patient  elected to proceed with surgical therapy as noted above. We have discussed the potential benefits and risks of the procedure, side effects of the proposed treatment, the likelihood of the patient achieving the goals  of the procedure, and any potential problems that might occur during the procedure or recuperation. Informed consent has been obtained.

## 2020-02-28 ENCOUNTER — Encounter (HOSPITAL_BASED_OUTPATIENT_CLINIC_OR_DEPARTMENT_OTHER)
Admission: RE | Disposition: A | Discharge status: Home / Self Care | Payer: Self-pay | Source: Home / Self Care | Attending: Urology

## 2020-02-28 ENCOUNTER — Other Ambulatory Visit: Payer: Self-pay

## 2020-02-28 ENCOUNTER — Encounter (HOSPITAL_BASED_OUTPATIENT_CLINIC_OR_DEPARTMENT_OTHER): Payer: Self-pay | Admitting: Urology

## 2020-02-28 ENCOUNTER — Ambulatory Visit (HOSPITAL_BASED_OUTPATIENT_CLINIC_OR_DEPARTMENT_OTHER): Payer: Commercial Managed Care - PPO | Admitting: Anesthesiology

## 2020-02-28 ENCOUNTER — Ambulatory Visit (HOSPITAL_BASED_OUTPATIENT_CLINIC_OR_DEPARTMENT_OTHER)
Admission: RE | Admit: 2020-02-28 | Discharge: 2020-02-28 | Disposition: A | Discharge status: Home / Self Care | Payer: Commercial Managed Care - PPO | Attending: Urology | Admitting: Urology

## 2020-02-28 DIAGNOSIS — Z880 Allergy status to penicillin: Secondary | ICD-10-CM | POA: Insufficient documentation

## 2020-02-28 DIAGNOSIS — Z79899 Other long term (current) drug therapy: Secondary | ICD-10-CM | POA: Insufficient documentation

## 2020-02-28 DIAGNOSIS — N319 Neuromuscular dysfunction of bladder, unspecified: Secondary | ICD-10-CM | POA: Diagnosis not present

## 2020-02-28 DIAGNOSIS — N3644 Muscular disorders of urethra: Secondary | ICD-10-CM | POA: Insufficient documentation

## 2020-02-28 DIAGNOSIS — M1990 Unspecified osteoarthritis, unspecified site: Secondary | ICD-10-CM | POA: Insufficient documentation

## 2020-02-28 DIAGNOSIS — F329 Major depressive disorder, single episode, unspecified: Secondary | ICD-10-CM | POA: Diagnosis not present

## 2020-02-28 DIAGNOSIS — Z791 Long term (current) use of non-steroidal anti-inflammatories (NSAID): Secondary | ICD-10-CM | POA: Diagnosis not present

## 2020-02-28 DIAGNOSIS — R339 Retention of urine, unspecified: Secondary | ICD-10-CM | POA: Diagnosis not present

## 2020-02-28 HISTORY — DX: Neurogenic bowel, not elsewhere classified: K59.2

## 2020-02-28 HISTORY — DX: Anemia, unspecified: D64.9

## 2020-02-28 HISTORY — PX: INSERTION OF SUPRAPUBIC CATHETER: SHX5870

## 2020-02-28 HISTORY — DX: Gastro-esophageal reflux disease without esophagitis: K21.9

## 2020-02-28 HISTORY — DX: Presence of other specified devices: Z97.8

## 2020-02-28 HISTORY — DX: Unspecified osteoarthritis, unspecified site: M19.90

## 2020-02-28 HISTORY — PX: BOTOX INJECTION: SHX5754

## 2020-02-28 HISTORY — DX: Other fracture of left lower leg, initial encounter for closed fracture: S82.892A

## 2020-02-28 HISTORY — DX: Neuromuscular dysfunction of bladder, unspecified: N31.9

## 2020-02-28 HISTORY — DX: Collapsed vertebra, not elsewhere classified, lumbar region, initial encounter for fracture: M48.56XA

## 2020-02-28 LAB — POCT I-STAT, CHEM 8
BUN: 17 mg/dL (ref 8–23)
Calcium, Ion: 1.22 mmol/L (ref 1.15–1.40)
Chloride: 101 mmol/L (ref 98–111)
Creatinine, Ser: 1 mg/dL (ref 0.61–1.24)
Glucose, Bld: 112 mg/dL — ABNORMAL HIGH (ref 70–99)
HCT: 40 % (ref 39.0–52.0)
Hemoglobin: 13.6 g/dL (ref 13.0–17.0)
Potassium: 4 mmol/L (ref 3.5–5.1)
Sodium: 142 mmol/L (ref 135–145)
TCO2: 34 mmol/L — ABNORMAL HIGH (ref 22–32)

## 2020-02-28 SURGERY — INSERTION, SUPRAPUBIC CATHETER
Anesthesia: General | Site: Urethra

## 2020-02-28 MED ORDER — ONDANSETRON HCL 4 MG/2ML IJ SOLN
INTRAMUSCULAR | Status: AC
  Filled 2020-02-28: qty 2

## 2020-02-28 MED ORDER — FENTANYL CITRATE (PF) 100 MCG/2ML IJ SOLN
INTRAMUSCULAR | Status: DC | PRN
  Administered 2020-02-28 (×2): 25 ug via INTRAVENOUS

## 2020-02-28 MED ORDER — LACTATED RINGERS IV SOLN
INTRAVENOUS | Status: DC
  Filled 2020-02-28 (×2): qty 1000

## 2020-02-28 MED ORDER — KETOROLAC TROMETHAMINE 30 MG/ML IJ SOLN
INTRAMUSCULAR | Status: AC
  Filled 2020-02-28: qty 1

## 2020-02-28 MED ORDER — FENTANYL CITRATE (PF) 100 MCG/2ML IJ SOLN
25.0000 ug | INTRAMUSCULAR | Status: DC | PRN
  Filled 2020-02-28: qty 1

## 2020-02-28 MED ORDER — MIDAZOLAM HCL 2 MG/2ML IJ SOLN
INTRAMUSCULAR | Status: DC | PRN
  Administered 2020-02-28: 2 mg via INTRAVENOUS

## 2020-02-28 MED ORDER — ONABOTULINUMTOXINA 100 UNITS IJ SOLR
INTRAMUSCULAR | Status: DC | PRN
  Administered 2020-02-28: 100 [IU] via INTRAMUSCULAR

## 2020-02-28 MED ORDER — OXYCODONE HCL 5 MG/5ML PO SOLN
5.0000 mg | Freq: Once | ORAL | Status: AC | PRN
  Filled 2020-02-28: qty 5

## 2020-02-28 MED ORDER — SCOPOLAMINE 1 MG/3DAYS TD PT72
MEDICATED_PATCH | TRANSDERMAL | Status: AC
  Filled 2020-02-28: qty 1

## 2020-02-28 MED ORDER — LIDOCAINE 2% (20 MG/ML) 5 ML SYRINGE
INTRAMUSCULAR | Status: AC
  Filled 2020-02-28: qty 5

## 2020-02-28 MED ORDER — MIDAZOLAM HCL 2 MG/2ML IJ SOLN
INTRAMUSCULAR | Status: AC
  Filled 2020-02-28: qty 2

## 2020-02-28 MED ORDER — DEXAMETHASONE SODIUM PHOSPHATE 10 MG/ML IJ SOLN
INTRAMUSCULAR | Status: DC | PRN
  Administered 2020-02-28: 10 mg via INTRAVENOUS

## 2020-02-28 MED ORDER — STERILE WATER FOR IRRIGATION IR SOLN
Status: DC | PRN
  Administered 2020-02-28 (×2): 3000 mL

## 2020-02-28 MED ORDER — EPHEDRINE SULFATE 50 MG/ML IJ SOLN
INTRAMUSCULAR | Status: DC | PRN
  Administered 2020-02-28: 15 mg via INTRAVENOUS

## 2020-02-28 MED ORDER — SODIUM CHLORIDE (PF) 0.9 % IJ SOLN
INTRAMUSCULAR | Status: DC | PRN
  Administered 2020-02-28: 8 mL via INTRAVENOUS

## 2020-02-28 MED ORDER — PROPOFOL 10 MG/ML IV BOLUS
INTRAVENOUS | Status: DC | PRN
  Administered 2020-02-28: 150 mg via INTRAVENOUS
  Administered 2020-02-28: 200 ug/kg/min via INTRAVENOUS

## 2020-02-28 MED ORDER — DEXAMETHASONE SODIUM PHOSPHATE 10 MG/ML IJ SOLN
INTRAMUSCULAR | Status: AC
  Filled 2020-02-28: qty 1

## 2020-02-28 MED ORDER — OXYCODONE HCL 5 MG PO TABS
5.0000 mg | ORAL_TABLET | Freq: Once | ORAL | Status: AC | PRN
  Administered 2020-02-28: 5 mg via ORAL
  Filled 2020-02-28: qty 1

## 2020-02-28 MED ORDER — SCOPOLAMINE 1 MG/3DAYS TD PT72
MEDICATED_PATCH | TRANSDERMAL | Status: DC | PRN
  Administered 2020-02-28: 1 via TRANSDERMAL

## 2020-02-28 MED ORDER — PROMETHAZINE HCL 25 MG/ML IJ SOLN
6.2500 mg | INTRAMUSCULAR | Status: DC | PRN
  Filled 2020-02-28: qty 1

## 2020-02-28 MED ORDER — ONDANSETRON HCL 4 MG/2ML IJ SOLN
INTRAMUSCULAR | Status: DC | PRN
  Administered 2020-02-28: 4 mg via INTRAVENOUS

## 2020-02-28 MED ORDER — OXYCODONE HCL 5 MG PO TABS
ORAL_TABLET | ORAL | Status: AC
  Filled 2020-02-28: qty 1

## 2020-02-28 MED ORDER — LIDOCAINE 2% (20 MG/ML) 5 ML SYRINGE
INTRAMUSCULAR | Status: DC | PRN
  Administered 2020-02-28: 60 mg via INTRAVENOUS

## 2020-02-28 MED ORDER — CIPROFLOXACIN IN D5W 400 MG/200ML IV SOLN
400.0000 mg | INTRAVENOUS | Status: AC
  Administered 2020-02-28: 400 mg via INTRAVENOUS
  Filled 2020-02-28: qty 200

## 2020-02-28 MED ORDER — EPHEDRINE 5 MG/ML INJ
INTRAVENOUS | Status: AC
  Filled 2020-02-28: qty 10

## 2020-02-28 MED ORDER — ARTIFICIAL TEARS OPHTHALMIC OINT
TOPICAL_OINTMENT | OPHTHALMIC | Status: AC
  Filled 2020-02-28: qty 3.5

## 2020-02-28 MED ORDER — FENTANYL CITRATE (PF) 100 MCG/2ML IJ SOLN
INTRAMUSCULAR | Status: AC
  Filled 2020-02-28: qty 2

## 2020-02-28 MED ORDER — PROPOFOL 10 MG/ML IV BOLUS
INTRAVENOUS | Status: AC
  Filled 2020-02-28: qty 40

## 2020-02-28 MED ORDER — CIPROFLOXACIN IN D5W 400 MG/200ML IV SOLN
INTRAVENOUS | Status: AC
  Filled 2020-02-28: qty 200

## 2020-02-28 SURGICAL SUPPLY — 38 items
BAG DRAIN URO-CYSTO SKYTR STRL (DRAIN) ×4 IMPLANT
BAG URINE DRAIN 2000ML AR STRL (UROLOGICAL SUPPLIES) ×2 IMPLANT
BAG URINE LEG 500ML (DRAIN) ×2 IMPLANT
BLADE CLIPPER SENSICLIP SURGIC (BLADE) ×2 IMPLANT
BLADE SURG 15 STRL LF DISP TIS (BLADE) IMPLANT
BLADE SURG 15 STRL SS (BLADE) ×2
CLOTH BEACON ORANGE TIMEOUT ST (SAFETY) ×4 IMPLANT
COVER SURGICAL LIGHT HANDLE (MISCELLANEOUS) ×2 IMPLANT
DRSG TEGADERM 4X4.75 (GAUZE/BANDAGES/DRESSINGS) ×2 IMPLANT
ELECT REM PT RETURN 9FT ADLT (ELECTROSURGICAL) ×4
ELECTRODE REM PT RTRN 9FT ADLT (ELECTROSURGICAL) IMPLANT
GAUZE SPONGE 4X4 12PLY STRL (GAUZE/BANDAGES/DRESSINGS) ×2 IMPLANT
GLOVE BIO SURGEON STRL SZ 6 (GLOVE) ×2 IMPLANT
GLOVE BIO SURGEON STRL SZ7.5 (GLOVE) ×4 IMPLANT
GLOVE BIOGEL PI IND STRL 7.0 (GLOVE) IMPLANT
GLOVE BIOGEL PI IND STRL 7.5 (GLOVE) IMPLANT
GLOVE BIOGEL PI INDICATOR 7.0 (GLOVE) ×2
GLOVE BIOGEL PI INDICATOR 7.5 (GLOVE) ×4
GOWN STRL REUS W/TWL LRG LVL3 (GOWN DISPOSABLE) ×2 IMPLANT
GOWN STRL REUS W/TWL XL LVL3 (GOWN DISPOSABLE) ×4 IMPLANT
HOLDER FOLEY CATH W/STRAP (MISCELLANEOUS) ×2 IMPLANT
KIT TURNOVER CYSTO (KITS) ×4 IMPLANT
MANIFOLD NEPTUNE II (INSTRUMENTS) ×4 IMPLANT
NDL ASPIRATION 22 (NEEDLE) IMPLANT
NDL SAFETY ECLIPSE 18X1.5 (NEEDLE) IMPLANT
NEEDLE ASPIRATION 22 (NEEDLE) ×4 IMPLANT
NEEDLE HYPO 18GX1.5 SHARP (NEEDLE)
PACK CYSTO (CUSTOM PROCEDURE TRAY) ×4 IMPLANT
PENCIL BUTTON HOLSTER BLD 10FT (ELECTRODE) ×2 IMPLANT
PLUG CATH AND CAP STER (CATHETERS) ×2 IMPLANT
SUT SILK 0 TIES 10X30 (SUTURE) ×2 IMPLANT
SYR 10ML LL (SYRINGE) ×2 IMPLANT
SYR CONTROL 10ML LL (SYRINGE) ×2 IMPLANT
SYR TOOMEY IRRIG 70ML (MISCELLANEOUS) ×4
SYRINGE TOOMEY IRRIG 70ML (MISCELLANEOUS) IMPLANT
TUBE CONNECTING 12'X1/4 (SUCTIONS) ×1
TUBE CONNECTING 12X1/4 (SUCTIONS) ×1 IMPLANT
WATER STERILE IRR 3000ML UROMA (IV SOLUTION) ×4 IMPLANT

## 2020-02-28 NOTE — Transfer of Care (Signed)
Immediate Anesthesia Transfer of Care Note  Patient: Louis Jordan  Procedure(s) Performed: INSERTION OF SUPRAPUBIC CATHETER (N/A Abdomen) CYSTOSCOPY BOTOX 100 UNITS INJECTION (N/A Urethra)  Patient Location: PACU  Anesthesia Type:General  Level of Consciousness: awake, alert , oriented and patient cooperative  Airway & Oxygen Therapy: Patient Spontanous Breathing and Patient connected to nasal cannula oxygen  Post-op Assessment: Report given to RN and Post -op Vital signs reviewed and stable  Post vital signs: Reviewed and stable  Last Vitals:  Vitals Value Taken Time  BP 103/76 02/28/20 1205  Temp    Pulse 74 02/28/20 1208  Resp 18 02/28/20 1208  SpO2 97 % 02/28/20 1208  Vitals shown include unvalidated device data.  Last Pain:  Vitals:   02/28/20 0934  TempSrc: Oral  PainSc: 4       Patients Stated Pain Goal: 4 (02/28/20 0934)  Complications: No apparent anesthesia complications

## 2020-02-28 NOTE — Anesthesia Procedure Notes (Signed)
Procedure Name: LMA Insertion Date/Time: 02/28/2020 11:10 AM Performed by: Tyrone Nine, CRNA Pre-anesthesia Checklist: Patient identified, Emergency Drugs available, Suction available and Patient being monitored Patient Re-evaluated:Patient Re-evaluated prior to induction Oxygen Delivery Method: Circle system utilized Preoxygenation: Pre-oxygenation with 100% oxygen Induction Type: IV induction Ventilation: Mask ventilation without difficulty LMA: LMA inserted LMA Size: 4.0 Number of attempts: 1 Airway Equipment and Method: Bite block

## 2020-02-28 NOTE — Discharge Instructions (Signed)
I have reviewed discharge instructions in detail with the patient. They will follow-up with me or their physician as scheduled. My nurse will also be calling the patients as per protocol.   Suprapubic Catheter Replacement  Suprapubic catheter replacement is a procedure to remove an old catheter and insert a new, clean catheter. A suprapubic catheter is a rubber tube that drains urine from the bladder into a collection bag outside the body. The catheter is inserted into the bladder through a small opening in the lower abdomen, near the center of the body, above the pubic bone (suprapubicarea). There is a tiny balloon filled with germ-free (sterile) water on the end of the catheter that is in the bladder. The balloon helps to keep the catheter in place. If you need to wear a catheter for a long period of time, you may be instructed to replace the catheter yourself. Usually, suprapubic catheters need to be replaced every 4-6 weeks, or as often as told by your health care provider. What are the risks? Generally, this is a safe procedure. However, problems may occur, including failure to get the catheter into the bladder. What happens before the procedure?  You may have an exam or testing, including a blood or urine sample.  Ask your health care provider what steps will be taken to help prevent infection. What happens during the procedure?  You will lie on your back.  The water from the balloon will be removed using a syringe.  The catheter will be slowly removed.  Lubricant will be applied to the end of the new catheter that will go into your bladder.  The new catheter will be inserted through the opening in your abdomen. Your health care provider will slide the catheter into your bladder.  Your health care provider will wait for some urine to start flowing through the catheter. When this happens, a syringe will be used to fill the balloon with sterile water.  A collection bag will be attached  to the end of the catheter. The procedure may vary among health care providers and hospitals. What can I expect after procedure? After the procedure, it is common to have:  Some discomfort around the opening in your abdomen. Follow these instructions at home: Caring for the skin around the catheter Use a clean washcloth and soapy water to clean the skin around your catheter every day. Pat the area dry with a clean towel.  Do not pull on the catheter.  Do not use ointment or lotion on this area unless told by your health care provider.  Check the skin around the catheter every day for signs of infection. Check for: ? Redness, swelling, or pain. ? Fluid or blood. ? Warmth. ? Pus or a bad smell. Caring for the catheter  Clean the catheter with soap and water as often as told by your health care provider.  Always make sure there are no twists or curls (kinks) in the catheter.  As soon as you are able to move, you may use a leg bag to collect the urine. ? Make sure that the tubing is straight and without kinks. ? Wrap an ace bandage gently over the tubing and around your leg to minimize the risk of the bag getting pulled out. Emptying the collection bag Empty the large collection bag every 8 hours. Empty the small collection bag when it is about ? full. To empty your large or small collection bag, take the following steps:  Always keep the bag  below the level of the catheter. This keeps urine from flowing backward into the catheter.  Hold the bag over the toilet or another container. Turn the valve (spigot) at the bottom of the bag to empty the urine. ? Do not touch the opening of the spigot. ? Do not let the opening touch the toilet or container.  Close the spigot tightly when the bag is empty. Cleaning the collection bag   Wash your hands with soap and water. If soap and water are not available, use hand sanitizer.  Disconnect the bag from the catheter and immediately attach a  new bag to the catheter.  Empty the used bag completely.  Clean the used bag according to the manufacturer's instructions, or as told by your health care provider.  Let the bag dry completely, and put it in a clean plastic bag before storing it. General instructions   Always wash your hands before and after caring for your catheter and collection bag. Use soap and water. If soap and water are not available, use hand sanitizer.  Always make sure there are no leaks in the catheter or collection bag.  Drink enough fluid to keep your urine pale yellow.  If you were prescribed an antibiotic medicine, take it as told by your health care provider. Do not stop taking the antibiotic even if you start to feel better.  Do not take baths, swim, or use a hot tub.  Keep all follow-up visits as told by your health care provider. This is important. Contact a health care provider if:  You leak urine.  You have redness, swelling, or pain around your catheter opening.  You have fluid or blood coming from your catheter opening.  Your catheter opening feels warm to the touch.  You have pus or a bad smell coming from your catheter opening.  You have a fever or chills.  Your urine flow slows down.  Your urine becomes cloudy or smelly. Get help right away if:  Your catheter comes out.  You feel nauseous.  You have back pain.  You have difficulty changing your catheter.  You have blood in your urine.  You have no urine flow for 1 hour. Summary  Suprapubic catheter replacement is a procedure to remove an old catheter and insert a new, clean catheter.  Make sure that you understand how to care for your catheter, your collection bag, and the opening in your abdomen.  Always wash your hands before and after caring for your catheter and collection bag.  Contact a health care provider if you leak urine or have a fever or any signs of infection around the catheter opening, or if your urine  becomes cloudy or smelly.  Get help right away if your catheter comes out or you have nausea, back pain, blood in your urine, no urine flow for 1 hour, or difficulty changing your catheter. This information is not intended to replace advice given to you by your health care provider. Make sure you discuss any questions you have with your health care provider. Document Revised: 07/21/2018 Document Reviewed: 07/21/2018 Elsevier Patient Education  [place] Instructions  Activity: Get plenty of rest for the remainder of the day. A responsible individual must stay with you for 24 hours following the procedure.  For the next 24 hours, DO NOT: -Drive a car -Paediatric nurse -Drink alcoholic beverages -Take any medication unless instructed by your physician -Make any legal decisions or sign  important papers.  Meals: Start with liquid foods such as gelatin or soup. Progress to regular foods as tolerated. Avoid greasy, spicy, heavy foods. If nausea and/or vomiting occur, drink only clear liquids until the nausea and/or vomiting subsides. Call your physician if vomiting continues.  Special Instructions/Symptoms: Your throat may feel dry or sore from the anesthesia or the breathing tube placed in your throat during surgery. If this causes discomfort, gargle with warm salt water. The discomfort should disappear within 24 hours.  If you had a scopolamine patch placed behind your ear for the management of post- operative nausea and/or vomiting:  1. The medication in the patch is effective for 72 hours, after which it should be removed.  Wrap patch in a tissue and discard in the trash. Wash hands thoroughly with soap and water. 2. You may remove the patch earlier than 72 hours if you experience unpleasant side effects which may include dry mouth, dizziness or visual disturbances. 3. Avoid touching the patch. Wash your hands with soap and water after contact with  the patch.

## 2020-02-28 NOTE — Op Note (Signed)
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 Lorin Picket Marcellus Pulliam  The patient has the above diagnosis and consented the above procedure.  He has a complicated history and has a Foley catheter.  Preoperative antibiotics were given.  His urine was sterile.  Urine was sent during the case.  I initially cystoscoped the patient and urethra was normal.  Sphincter was tight.  There was no false passage at 6:00.  He had mild bilobar enlargement of the prostate and a high riding bladder neck.  Bladder mucosa and trigone were identified and normal  The patient had marked on his lower abdomen where his belt is.  I could easily palpate the upper edge of the symphysis pubis and approximately 1 inch above that I used a marking pen to depress and we could see nicely the indentation along the anterior bladder wall not to the level of the dome but away from the bladder neck.  All landmarks were identified including the right lower quadrant swelling that he has previously identified  I made a 1.5 cm scalpel incision.  I used the Rusch delivery trocar and under control technique delivered into the bladder.  The inner trocar was removed.  16 French Foley catheter was inserted.  10 cc was placed in the balloon.  The sheath was removed as per technique.  We triple checked in the Foley catheter with balloon was in excellent position.  Using the injection scope I injected 100 units of Botox and 8 cc of normal saline in all 4 quadrants of the external sphincter.  There was no bleeding.  I recystoscoped the patient in the bladder looked good.  Suprapubic was in place.  There was no bleeding.  I 2-0 silk was used to approximate and  stabilize the suprapubic tube.  Sterile dressing was applied

## 2020-02-28 NOTE — Anesthesia Postprocedure Evaluation (Signed)
Anesthesia Post Note  Patient: Louis Jordan  Procedure(s) Performed: INSERTION OF SUPRAPUBIC CATHETER (N/A Abdomen) CYSTOSCOPY BOTOX 100 UNITS INJECTION (N/A Urethra)     Patient location during evaluation: PACU Anesthesia Type: General Level of consciousness: awake and alert Pain management: pain level controlled Vital Signs Assessment: post-procedure vital signs reviewed and stable Respiratory status: spontaneous breathing, nonlabored ventilation and respiratory function stable Cardiovascular status: blood pressure returned to baseline and stable Postop Assessment: no apparent nausea or vomiting Anesthetic complications: no    Last Vitals:  Vitals:   02/28/20 1245 02/28/20 1300  BP: 124/78 121/75  Pulse: 69 69  Resp: 19 16  Temp:    SpO2: 96% 94%    Last Pain:  Vitals:   02/28/20 1245  TempSrc:   PainSc: 5                  Beryle Lathe

## 2020-02-28 NOTE — Interval H&P Note (Signed)
History and Physical Interval Note:  02/28/2020 10:40 AM  Louis Jordan  has presented today for surgery, with the diagnosis of URINE RETENTION, EXTERNAL SPHINCTER DYSSYNERGIA, NEUROGENIC BLADDER.  The various methods of treatment have been discussed with the patient and family. After consideration of risks, benefits and other options for treatment, the patient has consented to  Procedure(s): INSERTION OF SUPRAPUBIC CATHETER (N/A) CYSTOSCOPY BOTOX 100 UNITS INJECTION (N/A) as a surgical intervention.  The patient's history has been reviewed, patient examined, no change in status, stable for surgery.  I have reviewed the patient's chart and labs.  Questions were answered to the patient's satisfaction.     Faraaz Wolin A Lajarvis Italiano

## 2020-02-28 NOTE — OR Nursing (Signed)
Foley catheter was removed at the beginning of the procedure.

## 2020-02-28 NOTE — Anesthesia Preprocedure Evaluation (Addendum)
Anesthesia Evaluation  Patient identified by MRN, date of birth, ID band Patient awake    Reviewed: Allergy & Precautions, NPO status , Patient's Chart, lab work & pertinent test results  History of Anesthesia Complications (+) PONV and history of anesthetic complications  Airway Mallampati: I  TM Distance: >3 FB Neck ROM: Full    Dental  (+) Dental Advisory Given, Teeth Intact   Pulmonary neg pulmonary ROS,    Pulmonary exam normal        Cardiovascular Normal cardiovascular exam+ Valvular Problems/Murmurs (MVP)      Neuro/Psych Depression  Hx spinal cord injury Ambulates with cane Neuropathic pain Hx cervical fusion     GI/Hepatic Neg liver ROS, GERD  Controlled,  Endo/Other  negative endocrine ROS  Renal/GU negative Renal ROS    Neurogenic bladder     Musculoskeletal  (+) Arthritis ,   Abdominal   Peds  Hematology negative hematology ROS (+)   Anesthesia Other Findings Covid neg 02/24/20  Significant Motion Sickness   Reproductive/Obstetrics                          Anesthesia Physical Anesthesia Plan  ASA: III  Anesthesia Plan: General   Post-op Pain Management:    Induction: Intravenous  PONV Risk Score and Plan: 4 or greater and Treatment may vary due to age or medical condition, Ondansetron, Dexamethasone and TIVA  Airway Management Planned: LMA  Additional Equipment: None  Intra-op Plan:   Post-operative Plan: Extubation in OR  Informed Consent: I have reviewed the patients History and Physical, chart, labs and discussed the procedure including the risks, benefits and alternatives for the proposed anesthesia with the patient or authorized representative who has indicated his/her understanding and acceptance.     Dental advisory given  Plan Discussed with: CRNA and Anesthesiologist  Anesthesia Plan Comments:        Anesthesia Quick Evaluation

## 2020-03-01 LAB — URINE CULTURE: Culture: >=100000 — AB

## 2020-03-02 ENCOUNTER — Emergency Department (HOSPITAL_BASED_OUTPATIENT_CLINIC_OR_DEPARTMENT_OTHER)
Admission: EM | Admit: 2020-03-02 | Discharge: 2020-03-03 | Disposition: A | Discharge status: Home / Self Care | Payer: Commercial Managed Care - PPO | Attending: Emergency Medicine | Admitting: Emergency Medicine

## 2020-03-02 ENCOUNTER — Other Ambulatory Visit: Payer: Self-pay

## 2020-03-02 ENCOUNTER — Encounter (HOSPITAL_BASED_OUTPATIENT_CLINIC_OR_DEPARTMENT_OTHER): Payer: Self-pay | Admitting: *Deleted

## 2020-03-02 DIAGNOSIS — R109 Unspecified abdominal pain: Secondary | ICD-10-CM

## 2020-03-02 DIAGNOSIS — Z79899 Other long term (current) drug therapy: Secondary | ICD-10-CM | POA: Insufficient documentation

## 2020-03-02 DIAGNOSIS — K5903 Drug induced constipation: Secondary | ICD-10-CM | POA: Diagnosis not present

## 2020-03-02 NOTE — ED Triage Notes (Signed)
States he had a suprapubic catheter surgery 3 days ago. Today he feels his abdomen is bloated. Foley catheter in and draining.

## 2020-03-03 ENCOUNTER — Emergency Department (HOSPITAL_BASED_OUTPATIENT_CLINIC_OR_DEPARTMENT_OTHER): Payer: Commercial Managed Care - PPO

## 2020-03-03 ENCOUNTER — Encounter (HOSPITAL_BASED_OUTPATIENT_CLINIC_OR_DEPARTMENT_OTHER): Payer: Self-pay

## 2020-03-03 LAB — CBC WITH DIFFERENTIAL/PLATELET
Abs Immature Granulocytes: 0.01 10*3/uL (ref 0.00–0.07)
Basophils Absolute: 0 10*3/uL (ref 0.0–0.1)
Basophils Relative: 1 %
Eosinophils Absolute: 0.2 10*3/uL (ref 0.0–0.5)
Eosinophils Relative: 2 %
HCT: 36.8 % — ABNORMAL LOW (ref 39.0–52.0)
Hemoglobin: 11.9 g/dL — ABNORMAL LOW (ref 13.0–17.0)
Immature Granulocytes: 0 %
Lymphocytes Relative: 17 %
Lymphs Abs: 1.2 10*3/uL (ref 0.7–4.0)
MCH: 30.3 pg (ref 26.0–34.0)
MCHC: 32.3 g/dL (ref 30.0–36.0)
MCV: 93.6 fL (ref 80.0–100.0)
Monocytes Absolute: 0.5 10*3/uL (ref 0.1–1.0)
Monocytes Relative: 7 %
Neutro Abs: 5.5 10*3/uL (ref 1.7–7.7)
Neutrophils Relative %: 73 %
Platelets: 308 10*3/uL (ref 150–400)
RBC: 3.93 MIL/uL — ABNORMAL LOW (ref 4.22–5.81)
RDW: 13.7 % (ref 11.5–15.5)
WBC: 7.4 10*3/uL (ref 4.0–10.5)
nRBC: 0 % (ref 0.0–0.2)

## 2020-03-03 LAB — URINALYSIS, ROUTINE W REFLEX MICROSCOPIC
Bilirubin Urine: NEGATIVE
Glucose, UA: NEGATIVE mg/dL
Hgb urine dipstick: NEGATIVE
Ketones, ur: 15 mg/dL — AB
Nitrite: POSITIVE — AB
Protein, ur: NEGATIVE mg/dL
Specific Gravity, Urine: 1.015 (ref 1.005–1.030)
pH: 7 (ref 5.0–8.0)

## 2020-03-03 LAB — URINALYSIS, MICROSCOPIC (REFLEX)

## 2020-03-03 LAB — COMPREHENSIVE METABOLIC PANEL
ALT: 15 U/L (ref 0–44)
AST: 14 U/L — ABNORMAL LOW (ref 15–41)
Albumin: 3.7 g/dL (ref 3.5–5.0)
Alkaline Phosphatase: 61 U/L (ref 38–126)
Anion gap: 9 (ref 5–15)
BUN: 20 mg/dL (ref 8–23)
CO2: 30 mmol/L (ref 22–32)
Calcium: 9 mg/dL (ref 8.9–10.3)
Chloride: 99 mmol/L (ref 98–111)
Creatinine, Ser: 0.97 mg/dL (ref 0.61–1.24)
GFR calc Af Amer: >60 mL/min (ref >60)
GFR calc non Af Amer: >60 mL/min (ref >60)
Glucose, Bld: 97 mg/dL (ref 70–99)
Potassium: 4.2 mmol/L (ref 3.5–5.1)
Sodium: 138 mmol/L (ref 135–145)
Total Bilirubin: 0.6 mg/dL (ref 0.3–1.2)
Total Protein: 6.1 g/dL — ABNORMAL LOW (ref 6.5–8.1)

## 2020-03-03 LAB — LIPASE, BLOOD: Lipase: 25 U/L (ref 11–51)

## 2020-03-03 MED ORDER — POLYETHYLENE GLYCOL 3350 17 GM/SCOOP PO POWD: ORAL | 0 refills | Status: AC

## 2020-03-03 MED ORDER — SODIUM CHLORIDE 0.9 % IV SOLN
1000.0000 mL | INTRAVENOUS | Status: DC
  Administered 2020-03-03: 500 mL via INTRAVENOUS

## 2020-03-03 MED ORDER — SODIUM CHLORIDE 0.9 % IV BOLUS (SEPSIS)
1000.0000 mL | Freq: Once | INTRAVENOUS | Status: AC
  Administered 2020-03-03: 1000 mL via INTRAVENOUS

## 2020-03-03 MED ORDER — IOHEXOL 300 MG/ML  SOLN
100.0000 mL | Freq: Once | INTRAMUSCULAR | Status: AC | PRN
  Administered 2020-03-03: 100 mL via INTRAVENOUS

## 2020-03-03 MED ORDER — HYDROMORPHONE HCL 1 MG/ML IJ SOLN
1.0000 mg | Freq: Once | INTRAMUSCULAR | Status: AC
  Administered 2020-03-03: 1 mg via INTRAVENOUS
  Filled 2020-03-03: qty 1

## 2020-03-03 MED ORDER — ACETAMINOPHEN 500 MG PO TABS: 1000.0000 mg | ORAL_TABLET | Freq: Three times a day (TID) | ORAL | 0 refills | Status: AC

## 2020-03-03 MED ORDER — ACETAMINOPHEN 500 MG PO TABS: 1000.0000 mg | ORAL_TABLET | Freq: Once | ORAL | Status: DC

## 2020-03-03 NOTE — ED Provider Notes (Signed)
[facility] EMERGENCY DEPARTMENT Provider Note  CSN: [id number] Arrival date & time: 03/02/20 2129  Chief Complaint(s) Abdominal Pain  HPI Louis Jordan is a 64 y.o. male with a history of traumatic compression fractures with resulting in neurogenic bladder presents to the emergency department with right-sided abdominal pain that began around 8 PM this evening.  Pain is a moderate to severe ache.  It is nonradiating.  Exacerbated with palpation.  No alleviating factors.  Patient endorsing nausea without emesis.  Patient had bowel movement earlier this morning.  Usually has a bowel movement 2-3 times a day.  He does report feeling bloated.  Patient recently had a suprapubic catheter placed 3 days ago, replacing his Foley catheter.  He denies any fevers or chills.  No chest pain or shortness of breath.  No other physical complaints.  HPI  Past Medical History Past Medical History:  Diagnosis Date  . Anemia   . Arthritis   . Bundle branch block, right   . Closed left ankle fracture   . Complication of anesthesia   . Compression fracture of lumbar vertebrae, non-traumatic ([place])   . Foley catheter in place    last changed 2 weeks ago  . GERD (gastroesophageal reflux disease)   . Hand pain    mild OA bilateral  hands  . Left knee injury 1976   requiring surgery for repair of ligaments/cartilage damage with nerve injury that took 20 years to resolve  . MVP (mitral valve prolapse)    mild no cardiologist  . Neurogenic bladder   . Neurogenic bowel   . PONV (postoperative nausea and vomiting)   . Spinal cord injury at T7-T12 level ([place]) 10/15/2019   due to accident 10-15-2019 fell 18 feet on concrete   Patient Active Problem List   Diagnosis Date Noted  . Adjustment disorder with depressed mood 01/15/2020  . Frequent UTI 01/13/2020  . Encounter for therapeutic drug monitoring 01/09/2020  . Nausea & vomiting 11/21/2019  . Ileus ([place]) 11/21/2019  . Hypoalbuminemia due to  protein-calorie malnutrition ([place])   . Acute blood loss anemia   . Postoperative pain   . Neuropathic pain   . Neurogenic bladder 10/26/2019  . Neurogenic bowel 10/26/2019  . Spinal cord injury, thoracic (T7-T12) ([place]) 10/25/2019  . Shoulder dislocation, left, initial encounter   . Closed burst fracture of lumbar vertebra ([place])   . 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 Closed fracture of left ankle   . Fall from ladder 10/22/2019  . 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 Closed displaced trimalleolar fracture of left ankle 10/22/2019  . S/P lumbar fusion 10/16/2019   Home Medication(s) Prior to Admission medications   Medication Sig Start Date End Date Taking? Authorizing Provider  acetaminophen (TYLENOL) 500 MG tablet Take 2 tablets (1,000 mg total) by mouth every 8 (eight) hours for 5 days. Do not take more than 4000 mg of acetaminophen (Tylenol) in a 24-hour period. Please note that other medicines that you may be prescribed may have Tylenol as well. 03/03/20 03/08/20  Arelis Neumeier, Grayce Sessions, MD  bisacodyl (DULCOLAX) 10 MG suppository Place 1 suppository (10 mg total) rectally daily after supper. Patient taking differently: Place 10 mg rectally daily as needed for moderate constipation.  11/08/19   Love, Ivan Anchors, PA-C  docusate sodium (COLACE) 100 MG capsule Take 1 capsule (100 mg total) by mouth 2 (two) times daily. 11/08/19   Love, Ivan Anchors, PA-C  docusate sodium (ENEMEEZ)  283 MG enema Place 1 enema (283 mg total) rectally daily. 12/02/19   Raulkar, Drema Pry, MD  ferrous sulfate 325 (65 FE) MG tablet Take 1 tablet (325 mg total) by mouth daily with breakfast. 11/08/19   Love, Evlyn Kanner, PA-C  gabapentin (NEURONTIN) 600 MG tablet TAKE 1 TABLET(600 MG) BY MOUTH THREE TIMES DAILY Patient taking differently: Take 600 mg by mouth 3 (three) times daily.  12/12/19   Raulkar, Drema Pry, MD  hydrocortisone (ANUSOL-HC) 2.5 % rectal cream Place rectally 3 (three) times  daily. Patient not taking: Reported on 02/10/2020 11/08/19   Love, Evlyn Kanner, PA-C  lidocaine (LIDODERM) 5 % Place 1 patch onto the skin daily. Remove & Discard patch within 12 hours or as directed by MD on bottom of L foot. Patient taking differently: Place 1 patch onto the skin as needed. Remove & Discard patch within 12 hours or as directed by MD on bottom of L foot. Takes prn 02/10/20   Lovorn, Aundra Millet, MD  lidocaine (XYLOCAINE) 2 % jelly Place 1 application into the urethra as needed (cathing). 11/08/19   Love, Evlyn Kanner, PA-C  LORazepam (ATIVAN) 0.5 MG tablet Take 1 tablet (0.5 mg total) by mouth 2 (two) times daily. 01/19/20   Jae Dire, MD  methocarbamol (ROBAXIN) 500 MG tablet Take 1 tablet (500 mg total) by mouth 4 (four) times daily. 11/28/19   Lovorn, Aundra Millet, MD  Multiple Vitamins-Minerals (MULTIVITAMIN ADULT PO) Take 1 tablet by mouth daily.    [provider]  ondansetron (ZOFRAN ODT) 4 MG disintegrating tablet Take 1 tablet (4 mg total) by mouth every 8 (eight) hours as needed. 02/02/20   Jacalyn Lefevre, MD  oxyCODONE (ROXICODONE) 5 MG immediate release tablet Take 2 tablets (10 mg total) by mouth 2 (two) times daily as needed for severe pain or breakthrough pain. 01/09/20   Lovorn, Aundra Millet, MD  oxyCODONE ER (XTAMPZA ER) 9 MG C12A Take 9 mg by mouth 2 (two) times daily. 11/28/19   Lovorn, Aundra Millet, MD  pantoprazole (PROTONIX) 40 MG tablet Take 1 tablet (40 mg total) by mouth daily. 01/19/20   Jae Dire, MD  polyethylene glycol powder [facility]) 17 GM/SCOOP powder Please take 6 capfuls of MiraLAX in a 32 oz bottle of Gatorade over 2-4 hour period. The following day take 3 capfuls. On day 3 start taking 1 capful 3 times a day. Slowly cut back as needed until you have normal bowel movements. 03/03/20   Nira Conn, MD  pregabalin (LYRICA) 150 MG capsule Take 1 capsule (150 mg total) by mouth 2 (two) times daily. Take after 1 week of 75 mg BID 02/21/20   Lovorn, Aundra Millet, MD   pregabalin (LYRICA) 75 MG capsule Take 1 capsule (75 mg total) by mouth 2 (two) times daily. 1x 1week- can take with gabapentin for now- will wean gabapentin after dose is effective. 02/21/20 02/20/21  Lovorn, Aundra Millet, MD  PROMETHEGAN 25 MG suppository UNWRAP AND INSERT 1 SUPPOSITORY(25 MG) RECTALLY EVERY 6 HOURS AS NEEDED FOR NAUSEA OR VOMITING 01/30/20   Lovorn, Aundra Millet, MD  PROMETHEGAN 25 MG suppository UNWRAP AND INSERT 1 SUPPOSITORY(25 MG) RECTALLY EVERY 6 HOURS AS NEEDED FOR NAUSEA OR VOMITING 01/30/20   Lovorn, Aundra Millet, MD  senna (SENOKOT) 8.6 MG TABS tablet Take 1 tablet (8.6 mg total) by mouth 2 (two) times daily. 11/08/19   Love, Evlyn Kanner, PA-C  sucralfate (CARAFATE) 1 GM/10ML suspension Take 10 mLs (1 g total) by mouth 4 (four) times daily -  with  meals and at bedtime. 01/19/20   Jae Dire, MD  tamsulosin (FLOMAX) 0.4 MG CAPS capsule Take 2 capsules (0.8 mg total) by mouth daily after supper. 11/08/19   Love, Evlyn Kanner, PA-C  tapentadol HCl (NUCYNTA) 75 MG tablet Take 1 tablet (75 mg total) by mouth every 6 (six) hours as needed for moderate pain (for nerve pain). Patient taking differently: Take 75 mg by mouth every 6 (six) hours as needed for moderate pain (for nerve pain). Takes in am 02/10/20   Lovorn, Aundra Millet, MD  traMADol (ULTRAM) 50 MG tablet Take 1 tablet (50 mg total) by mouth every 6 (six) hours as needed for moderate pain. 11/28/19   Lovorn, Aundra Millet, MD  traZODone (DESYREL) 50 MG tablet Take 2 tablets (100 mg total) by mouth at bedtime. 01/19/20   Jae Dire, MD                                                                                                                                    Past Surgical History Past Surgical History:  Procedure Laterality Date  . APPENDECTOMY  1964  . BOTOX INJECTION N/A 02/28/2020   Procedure: CYSTOSCOPY BOTOX 100 UNITS INJECTION;  Surgeon: Alfredo Martinez, MD;  [facility];  Service: Urology;  Laterality: N/A;  . CERVICAL  FUSION  2000   c 5 to c 7   . EYE SURGERY Bilateral 1962   eye muscle sx  . INSERTION OF SUPRAPUBIC CATHETER N/A 02/28/2020   Procedure: INSERTION OF SUPRAPUBIC CATHETER;  Surgeon: Alfredo Martinez, MD;  Location: [facility];  Service: Urology;  Laterality: N/A;  . KNEE SURGERY Right   . LUMBAR SPINE SURGERY  10/16/2019   L1 burst fracture with epidural hematoma and canal stenosis  . OPEN REDUCTION INTERNAL FIXATION (ORIF) TIBIA/FIBULA FRACTURE Left 10/19/2019   Procedure: OPEN REDUCTION INTERNAL FIXATION (ORIF) TIBIA/FIBULA FRACTURE;  Surgeon: Roby Lofts, MD;  Location: [facility] OR;  Service: Orthopedics;  Laterality: Left;  . SHOULDER ARTHROSCOPY WITH BANKART REPAIR Left 10/19/2019   Procedure: SHOULDER ARTHROSCOPY WITH BANKART REPAIR, EXTENSIVE DEBRIDEMENT, LOOSE BODY REMOVAL;  Surgeon: Bjorn Pippin, MD;  Location: [facility] OR;  Service: Orthopedics;  Laterality: Left;  . STRABISMUS SURGERY    . TONSILLECTOMY     Family History Family History  Problem Relation Age of Onset  . Heart disease Mother   . Alzheimer's disease Mother   . Pancreatic cancer Father   . High blood pressure Brother     Social History Social History   Tobacco Use  . Smoking status: Never Smoker  . Smokeless tobacco: Never Used  Substance Use Topics  . Alcohol use: Not Currently  . Drug use: Never   Allergies Penicillins  Review of Systems Review of Systems All other systems are reviewed and are negative for acute change except as noted in the HPI  Physical Exam Vital Signs  I have  reviewed the triage vital signs BP (!) 185/96 (BP Location: Right Arm)   Pulse 63   Temp 98.7 F (37.1 C) (Oral)   Resp 14   Ht 6' (1.829 m)   Wt 62.6 kg   SpO2 98%   BMI 18.72 kg/m   Physical Exam Vitals reviewed.  Constitutional:      General: He is not in acute distress.    Appearance: He is well-developed. He is not diaphoretic.  HENT:     Head: Normocephalic and atraumatic.     Jaw: No  trismus.     Right Ear: External ear normal.     Left Ear: External ear normal.     Nose: Nose normal.  Eyes:     General: No scleral icterus.    Conjunctiva/sclera: Conjunctivae normal.  Neck:     Trachea: Phonation normal.  Cardiovascular:     Rate and Rhythm: Normal rate and regular rhythm.  Pulmonary:     Effort: Pulmonary effort is normal. No respiratory distress.     Breath sounds: No stridor.  Abdominal:     General: There is no distension.     Tenderness: There is abdominal tenderness in the right upper quadrant and right lower quadrant. There is guarding. There is no rebound.    Musculoskeletal:        General: Normal range of motion.     Cervical back: Normal range of motion.  Neurological:     Mental Status: He is alert and oriented to person, place, and time.  Psychiatric:        Behavior: Behavior normal.     ED Results and Treatments Labs (all labs ordered are listed, but only abnormal results are displayed) Labs Reviewed  CBC WITH DIFFERENTIAL/PLATELET - Abnormal; Notable for the following components:      Result Value   RBC 3.93 (*)    Hemoglobin 11.9 (*)    HCT 36.8 (*)    All other components within normal limits  COMPREHENSIVE METABOLIC PANEL - Abnormal; Notable for the following components:   Total Protein 6.1 (*)    AST 14 (*)    All other components within normal limits  URINALYSIS, ROUTINE W REFLEX MICROSCOPIC - Abnormal; Notable for the following components:   APPearance HAZY (*)    Ketones, ur 15 (*)    Nitrite POSITIVE (*)    Leukocytes,Ua SMALL (*)    All other components within normal limits  URINALYSIS, MICROSCOPIC (REFLEX) - Abnormal; Notable for the following components:   Bacteria, UA MANY (*)    All other components within normal limits  URINE CULTURE  LIPASE, BLOOD                                                                                                                         EKG  EKG Interpretation  Date/Time:     Ventricular Rate:    PR Interval:    QRS Duration:   QT Interval:  QTC Calculation:   R Axis:     Text Interpretation:        Radiology CT ABDOMEN PELVIS W CONTRAST  Result Date: 03/03/2020 CLINICAL DATA:  Bowel obstruction suspected, suprapubic catheter surgery 3 days prior, feels like is abdomen is bloated today. EXAM: CT ABDOMEN AND PELVIS WITH CONTRAST TECHNIQUE: Multidetector CT imaging of the abdomen and pelvis was performed using the standard protocol following bolus administration of intravenous contrast. CONTRAST:  100mL OMNIPAQUE IOHEXOL 300 MG/ML  SOLN COMPARISON:  CT abdomen pelvis 02/02/2020, abdominal ultrasound 01/18/2020 FINDINGS: Lower chest: Atelectatic changes noted in the lung bases. Lung bases are otherwise clear. Normal heart size. No pericardial effusion. Hepatobiliary: No focal liver abnormality is seen. Mild prominence of the intra and extrahepatic biliary tree without visible calcified gallstones in the gallbladder or bile ducts. No gallbladder wall thickening, pericholecystic fluid or inflammation. Pancreas: Unremarkable. No pancreatic ductal dilatation or surrounding inflammatory changes. Spleen: Normal in size without focal abnormality. Adrenals/Urinary Tract: Normal adrenal glands. Few subcentimeter hypoattenuating foci in the kidneys too small to fully characterize on CT imaging but statistically likely benign. Kidneys enhance and excrete symmetrically. No urinary tract dilatation is seen. There is marked circumferential bladder wall thickening with the bladder partially decompressed around an inflated suprapubic catheter. Small amount of gas noted within the bladder lumen as well as along the catheter tract and within the space of Retzius, a nonspecific finding in the setting of recent procedure. Stomach/Bowel: Distal esophagus, stomach and duodenum are unremarkable. No frank small bowel dilatation or wall thickening. A targetoid appearance of the small bowel left  upper quadrant (2/27) has an appearance compatible with a transient small bowel-small bowel intussusception which is an often benign incidental finding which will spontaneously reduce. No evidence of upstream obstruction. No other features of small bowel obstruction seen elsewhere in the abdomen. There is a moderate volume of fecalized material in the terminal ileum. There is a large volume of stool seen throughout the colon. No frank colonic wall thickening or dilatation. The appendix is surgically absent. Vascular/Lymphatic: Atherosclerotic plaque within the normal caliber aorta. No suspicious or enlarged lymph nodes in the included lymphatic chains. Reproductive: Coarse eccentric calcification of the prostate. No concerning abnormalities of the prostate or seminal vesicles. Other: Foci of gas and stranding adjacent the urinary bladder likely related to recent suprapubic catheter placement and nonspecific given the recent nature of the procedure. No other free abdominopelvic air or fluid is seen. Mild body wall edema is noted. No bowel containing hernias. Musculoskeletal: Postsurgical changes from prior long segment thoracolumbar fusion and posterior decompression traversing the L1 burst fracture with significant retropulsion towards the spinal canal. Postsurgical changes are similar to the comparison exam. No evidence of acute hardware complication. No other acute osseous or soft tissue abnormality is seen. IMPRESSION: 1. Distal small bowel fecalization and large volume of stool seen throughout the colon, can reflect slowed intestinal transit either from constipation or recent analgesic use. No evidence of frank mechanical obstruction. A transient small bowel intussusception is noted in the left upper quadrant, and often benign incidental finding which will typically reduce spontaneously. 2. Circumferential bladder wall thickening with adjacent foci of gas and stranding in the sub peritoneal and extraperitoneal  spaces adjacent the bladder likely related to recent suprapubic catheter placement. 3. Mild prominence of the intra and extrahepatic biliary tree without visible calcified gallstones. If there is clinical concern for biliary pathology, a nonemergent MRCP could be performed. 4. Postsurgical changes from prior long segment thoracolumbar  fusion and posterior decompression traversing the L1 burst fracture with significant retropulsion towards the spinal canal. Postsurgical changes are similar to the comparison exam. No evidence of acute hardware complication. 5. Mild body wall edema. 6. Aortic Atherosclerosis (ICD10-I70.0). Electronically Signed   By: Kreg Shropshire M.D.   On: 03/03/2020 02:33    Pertinent labs & imaging results that were available during my care of the patient were reviewed by me and considered in my medical decision making (see chart for details).  Medications Ordered in ED Medications  sodium chloride 0.9 % bolus 1,000 mL (0 mLs Intravenous Stopped 03/03/20 0146)    Followed by  0.9 %  sodium chloride infusion (500 mLs Intravenous New Bag/Given 03/03/20 0147)  acetaminophen (TYLENOL) tablet 1,000 mg (has no administration in time range)  HYDROmorphone (DILAUDID) injection 1 mg (1 mg Intravenous Given 03/03/20 0103)  iohexol (OMNIPAQUE) 300 MG/ML solution 100 mL (100 mLs Intravenous Contrast Given 03/03/20 0205)                                                                                                                                    Procedures Procedures  (including critical care time)  Medical Decision Making / ED Course I have reviewed the nursing notes for this encounter and the patient's prior records (if available in EHR or on provided paperwork).   Louis Jordan was evaluated in Emergency Department on 03/03/2020 for the symptoms described in the history of present illness. He was evaluated in the context of the global COVID-19 pandemic, which necessitated  consideration that the patient might be at risk for infection with the SARS-CoV-2 virus that causes COVID-19. Institutional protocols and algorithms that pertain to the evaluation of patients at risk for COVID-19 are in a state of rapid change based on information released by regulatory bodies including the CDC and federal and state organizations. These policies and algorithms were followed during the patient's care in the ED.  Labs and work-up reassuring without serious intra-abdominal inflammatory/infectious process.  There is thickening of the bladder and the urine has evidence of infection but this is similar to prior UAs.  Culture sent.  Given his lack of leukocytosis and fevers, will not treat empirically for UTI.      Final Clinical Impression(s) / ED Diagnoses Final diagnoses:  Right sided abdominal pain  Drug-induced constipation    The patient appears reasonably screened and/or stabilized for discharge and I doubt any other medical condition or other [facility] requiring further screening, evaluation, or treatment in the ED at this time prior to discharge. Safe for discharge with strict return precautions.  Disposition: Discharge  Condition: Good  I have discussed the results, Dx and Tx plan with the patient/family who expressed understanding and agree(s) with the plan. Discharge instructions discussed at length. The patient/family was given strict return precautions who verbalized understanding of the instructions. No further questions at time of discharge.  ED Discharge Orders         Ordered    polyethylene glycol powder (MIRALAX) 17 GM/SCOOP powder     03/03/20 0354    acetaminophen (TYLENOL) 500 MG tablet  Every 8 hours     03/03/20 0358            Follow Up: [name]., MD [address] [place] [address] [phone]  Call  As needed     This chart was dictated using voice recognition software.  Despite best efforts to proofread,  errors can occur  which can change the documentation meaning.   Nira Conn, MD 03/03/20 [phone]

## 2020-03-05 ENCOUNTER — Emergency Department (HOSPITAL_COMMUNITY): Payer: Commercial Managed Care - PPO

## 2020-03-05 ENCOUNTER — Other Ambulatory Visit: Payer: Self-pay

## 2020-03-05 ENCOUNTER — Emergency Department (HOSPITAL_COMMUNITY)
Admission: EM | Admit: 2020-03-05 | Discharge: 2020-03-05 | Disposition: A | Discharge status: Home / Self Care | Payer: Commercial Managed Care - PPO | Attending: Emergency Medicine | Admitting: Emergency Medicine

## 2020-03-05 ENCOUNTER — Encounter (HOSPITAL_COMMUNITY): Payer: Self-pay | Admitting: Emergency Medicine

## 2020-03-05 ENCOUNTER — Telehealth: Payer: Self-pay

## 2020-03-05 DIAGNOSIS — N319 Neuromuscular dysfunction of bladder, unspecified: Secondary | ICD-10-CM | POA: Insufficient documentation

## 2020-03-05 DIAGNOSIS — R1013 Epigastric pain: Secondary | ICD-10-CM | POA: Diagnosis not present

## 2020-03-05 DIAGNOSIS — Z79899 Other long term (current) drug therapy: Secondary | ICD-10-CM | POA: Insufficient documentation

## 2020-03-05 DIAGNOSIS — R112 Nausea with vomiting, unspecified: Secondary | ICD-10-CM | POA: Insufficient documentation

## 2020-03-05 DIAGNOSIS — R109 Unspecified abdominal pain: Secondary | ICD-10-CM | POA: Diagnosis present

## 2020-03-05 LAB — URINALYSIS, ROUTINE W REFLEX MICROSCOPIC
Bilirubin Urine: NEGATIVE
Glucose, UA: NEGATIVE mg/dL
Hgb urine dipstick: NEGATIVE
Ketones, ur: 5 mg/dL — AB
Nitrite: POSITIVE — AB
Protein, ur: NEGATIVE mg/dL
Specific Gravity, Urine: 1.008 (ref 1.005–1.030)
pH: 8 (ref 5.0–8.0)

## 2020-03-05 LAB — CBC
HCT: 35.9 % — ABNORMAL LOW (ref 39.0–52.0)
Hemoglobin: 11.7 g/dL — ABNORMAL LOW (ref 13.0–17.0)
MCH: 30.2 pg (ref 26.0–34.0)
MCHC: 32.6 g/dL (ref 30.0–36.0)
MCV: 92.5 fL (ref 80.0–100.0)
Platelets: 274 10*3/uL (ref 150–400)
RBC: 3.88 MIL/uL — ABNORMAL LOW (ref 4.22–5.81)
RDW: 13.8 % (ref 11.5–15.5)
WBC: 7.2 10*3/uL (ref 4.0–10.5)
nRBC: 0 % (ref 0.0–0.2)

## 2020-03-05 LAB — COMPREHENSIVE METABOLIC PANEL
ALT: 12 U/L (ref 0–44)
AST: 13 U/L — ABNORMAL LOW (ref 15–41)
Albumin: 3.5 g/dL (ref 3.5–5.0)
Alkaline Phosphatase: 62 U/L (ref 38–126)
Anion gap: 7 (ref 5–15)
BUN: 16 mg/dL (ref 8–23)
CO2: 28 mmol/L (ref 22–32)
Calcium: 8.9 mg/dL (ref 8.9–10.3)
Chloride: 105 mmol/L (ref 98–111)
Creatinine, Ser: 0.81 mg/dL (ref 0.61–1.24)
GFR calc Af Amer: >60 mL/min (ref >60)
GFR calc non Af Amer: >60 mL/min (ref >60)
Glucose, Bld: 113 mg/dL — ABNORMAL HIGH (ref 70–99)
Potassium: 4.1 mmol/L (ref 3.5–5.1)
Sodium: 140 mmol/L (ref 135–145)
Total Bilirubin: 0.7 mg/dL (ref 0.3–1.2)
Total Protein: 6.1 g/dL — ABNORMAL LOW (ref 6.5–8.1)

## 2020-03-05 LAB — LIPASE, BLOOD: Lipase: 18 U/L (ref 11–51)

## 2020-03-05 MED ORDER — ALUM & MAG HYDROXIDE-SIMETH 200-200-20 MG/5ML PO SUSP
30.0000 mL | Freq: Once | ORAL | Status: AC
  Administered 2020-03-05: 30 mL via ORAL
  Filled 2020-03-05: qty 30

## 2020-03-05 MED ORDER — PROMETHAZINE HCL 25 MG/ML IJ SOLN
12.5000 mg | Freq: Once | INTRAMUSCULAR | Status: AC
  Administered 2020-03-05: 12.5 mg via INTRAVENOUS
  Filled 2020-03-05: qty 1

## 2020-03-05 MED ORDER — PANTOPRAZOLE SODIUM 40 MG PO TBEC: 40.0000 mg | DELAYED_RELEASE_TABLET | Freq: Every day | ORAL | 0 refills | Status: DC

## 2020-03-05 MED ORDER — SUCRALFATE 1 GM/10ML PO SUSP: 1.0000 g | Freq: Three times a day (TID) | ORAL | 0 refills | Status: DC

## 2020-03-05 MED ORDER — PANTOPRAZOLE SODIUM 40 MG PO TBEC
40.0000 mg | DELAYED_RELEASE_TABLET | Freq: Once | ORAL | Status: AC
  Administered 2020-03-05: 40 mg via ORAL
  Filled 2020-03-05: qty 1

## 2020-03-05 MED ORDER — SODIUM CHLORIDE (PF) 0.9 % IJ SOLN
INTRAMUSCULAR | Status: AC
  Filled 2020-03-05: qty 50

## 2020-03-05 MED ORDER — SODIUM CHLORIDE 0.9 % IV BOLUS
1000.0000 mL | Freq: Once | INTRAVENOUS | Status: AC
  Administered 2020-03-05: 1000 mL via INTRAVENOUS

## 2020-03-05 MED ORDER — HYDROMORPHONE HCL 1 MG/ML IJ SOLN
1.0000 mg | Freq: Once | INTRAMUSCULAR | Status: AC
  Administered 2020-03-05: 1 mg via INTRAVENOUS
  Filled 2020-03-05: qty 1

## 2020-03-05 MED ORDER — SODIUM CHLORIDE 0.9% FLUSH
3.0000 mL | Freq: Once | INTRAVENOUS | Status: AC
  Administered 2020-03-05: 3 mL via INTRAVENOUS

## 2020-03-05 MED ORDER — IOHEXOL 300 MG/ML  SOLN
100.0000 mL | Freq: Once | INTRAMUSCULAR | Status: AC | PRN
  Administered 2020-03-05: 100 mL via INTRAVENOUS

## 2020-03-05 MED ORDER — LIDOCAINE VISCOUS HCL 2 % MT SOLN
15.0000 mL | Freq: Once | OROMUCOSAL | Status: AC
  Administered 2020-03-05: 15 mL via ORAL
  Filled 2020-03-05: qty 15

## 2020-03-05 NOTE — ED Triage Notes (Signed)
Patient here from home reporting "severe" abd pain, n/v, and constipation "from narcotics". Reports being seen for same and diagnosed with "colon twisting which was suppose to heal on its on".

## 2020-03-05 NOTE — ED Provider Notes (Signed)
[facility]-EMERGENCY DEPT Provider Note   CSN: [id number] Arrival date & time: 03/05/20  1135     History Chief Complaint  Patient presents with  . Abdominal Pain  . Nausea  . Emesis    Louis Jordan is a 64 y.o. male.  The history is provided by the patient and medical records. No language interpreter was used.  Abdominal Pain Pain location:  Generalized Pain quality: aching and sharp   Pain radiates to:  Does not radiate Pain severity:  Severe Onset quality:  Gradual Duration:  6 days Timing:  Constant Progression:  Worsening Chronicity:  New Context: previous surgery   Context: not trauma   Relieved by:  Nothing Ineffective treatments:  None tried Associated symptoms: constipation, diarrhea, nausea and vomiting   Associated symptoms: no chest pain, no chills, no cough, no dysuria, no fatigue, no fever and no shortness of breath        Past Medical History:  Diagnosis Date  . Anemia   . Arthritis   . Bundle branch block, right   . Closed left ankle fracture   . Complication of anesthesia   . Compression fracture of lumbar vertebrae, non-traumatic (HCC)   . Foley catheter in place    last changed 2 weeks ago  . GERD (gastroesophageal reflux disease)   . Hand pain    mild OA bilateral  hands  . Left knee injury 1976   requiring surgery for repair of ligaments/cartilage damage with nerve injury that took 20 years to resolve  . MVP (mitral valve prolapse)    mild no cardiologist  . Neurogenic bladder   . Neurogenic bowel   . PONV (postoperative nausea and vomiting)   . Spinal cord injury at T7-T12 level (HCC) 10/15/2019   due to accident 10-15-2019 fell 18 feet on concrete    Patient Active Problem List   Diagnosis Date Noted  . Adjustment disorder with depressed mood 01/15/2020  . Frequent UTI 01/13/2020  . Encounter for therapeutic drug monitoring 01/09/2020  . Nausea & vomiting 11/21/2019  . Ileus (HCC) 11/21/2019  .  Hypoalbuminemia due to protein-calorie malnutrition (HCC)   . Acute blood loss anemia   . Postoperative pain   . Neuropathic pain   . Neurogenic bladder 10/26/2019  . Neurogenic bowel 10/26/2019  . Spinal cord injury, 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  . Shoulder dislocation, left, initial encounter   . Closed burst fracture of lumbar vertebra (HCC)   . Cauda equina spinal cord injury (HCC)   . Closed fracture of left ankle   . Fall from ladder 10/22/2019  . 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 Closed displaced trimalleolar fracture of left ankle 10/22/2019  . S/P lumbar fusion 10/16/2019    Past Surgical History:  Procedure Laterality Date  . APPENDECTOMY  1964  . BOTOX INJECTION N/A 02/28/2020   Procedure: CYSTOSCOPY BOTOX 100 UNITS INJECTION;  Surgeon: Alfredo Martinez, MD;  Location: [facility];  Service: Urology;  Laterality: N/A;  . CERVICAL FUSION  2000   c 5 to c 7   . EYE SURGERY Bilateral 1962   eye muscle sx  . INSERTION OF SUPRAPUBIC CATHETER N/A 02/28/2020   Procedure: INSERTION OF SUPRAPUBIC CATHETER;  Surgeon: Alfredo Martinez, MD;  Location: [facility];  Service: Urology;  Laterality: N/A;  . KNEE SURGERY Right   . LUMBAR SPINE SURGERY  10/16/2019   L1 burst fracture with epidural hematoma and canal stenosis  .  OPEN REDUCTION INTERNAL FIXATION (ORIF) TIBIA/FIBULA FRACTURE Left 10/19/2019   Procedure: OPEN REDUCTION INTERNAL FIXATION (ORIF) TIBIA/FIBULA FRACTURE;  Surgeon: Roby Lofts, MD;  Location: [facility] OR;  Service: Orthopedics;  Laterality: Left;  . SHOULDER ARTHROSCOPY WITH BANKART REPAIR Left 10/19/2019   Procedure: SHOULDER ARTHROSCOPY WITH BANKART REPAIR, EXTENSIVE DEBRIDEMENT, LOOSE BODY REMOVAL;  Surgeon: Bjorn Pippin, MD;  Location: [facility] OR;  Service: Orthopedics;  Laterality: Left;  . STRABISMUS SURGERY    . TONSILLECTOMY         Family History  Problem Relation Age of Onset  . Heart disease Mother    . Alzheimer's disease Mother   . Pancreatic cancer Father   . High blood pressure Brother     Social History   Tobacco Use  . Smoking status: Never Smoker  . Smokeless tobacco: Never Used  Substance Use Topics  . Alcohol use: Not Currently  . Drug use: Never    Home Medications Prior to Admission medications   Medication Sig Start Date End Date Taking? Authorizing Provider  acetaminophen (TYLENOL) 500 MG tablet Take 2 tablets (1,000 mg total) by mouth every 8 (eight) hours for 5 days. Do not take more than 4000 mg of acetaminophen (Tylenol) in a 24-hour period. Please note that other medicines that you may be prescribed may have Tylenol as well. 03/03/20 03/08/20  Cardama, Amadeo Garnet, MD  bisacodyl (DULCOLAX) 10 MG suppository Place 1 suppository (10 mg total) rectally daily after supper. Patient taking differently: Place 10 mg rectally daily as needed for moderate constipation.  11/08/19   Love, Evlyn Kanner, PA-C  docusate sodium (COLACE) 100 MG capsule Take 1 capsule (100 mg total) by mouth 2 (two) times daily. 11/08/19   Love, Evlyn Kanner, PA-C  docusate sodium (ENEMEEZ) 283 MG enema Place 1 enema (283 mg total) rectally daily. 12/02/19   Raulkar, Drema Pry, MD  ferrous sulfate 325 (65 FE) MG tablet Take 1 tablet (325 mg total) by mouth daily with breakfast. 11/08/19   Love, Evlyn Kanner, PA-C  gabapentin (NEURONTIN) 600 MG tablet TAKE 1 TABLET(600 MG) BY MOUTH THREE TIMES DAILY Patient taking differently: Take 600 mg by mouth 3 (three) times daily.  12/12/19   Raulkar, Drema Pry, MD  hydrocortisone (ANUSOL-HC) 2.5 % rectal cream Place rectally 3 (three) times daily. Patient not taking: Reported on 02/10/2020 11/08/19   Love, Evlyn Kanner, PA-C  lidocaine (LIDODERM) 5 % Place 1 patch onto the skin daily. Remove & Discard patch within 12 hours or as directed by MD on bottom of L foot. Patient taking differently: Place 1 patch onto the skin as needed. Remove & Discard patch within 12 hours or as  directed by MD on bottom of L foot. Takes prn 02/10/20   Lovorn, Aundra Millet, MD  lidocaine (XYLOCAINE) 2 % jelly Place 1 application into the urethra as needed (cathing). 11/08/19   Love, Evlyn Kanner, PA-C  LORazepam (ATIVAN) 0.5 MG tablet Take 1 tablet (0.5 mg total) by mouth 2 (two) times daily. 01/19/20   Jae Dire, MD  methocarbamol (ROBAXIN) 500 MG tablet Take 1 tablet (500 mg total) by mouth 4 (four) times daily. 11/28/19   Lovorn, Aundra Millet, MD  Multiple Vitamins-Minerals (MULTIVITAMIN ADULT PO) Take 1 tablet by mouth daily.    [provider]  ondansetron (ZOFRAN ODT) 4 MG disintegrating tablet Take 1 tablet (4 mg total) by mouth every 8 (eight) hours as needed. 02/02/20   Jacalyn Lefevre, MD  oxyCODONE (ROXICODONE) 5 MG immediate release tablet  Take 2 tablets (10 mg total) by mouth 2 (two) times daily as needed for severe pain or breakthrough pain. 01/09/20   Lovorn, Aundra Millet, MD  oxyCODONE ER (XTAMPZA ER) 9 MG C12A Take 9 mg by mouth 2 (two) times daily. 11/28/19   Lovorn, Aundra Millet, MD  pantoprazole (PROTONIX) 40 MG tablet Take 1 tablet (40 mg total) by mouth daily. 01/19/20   Jae Dire, MD  polyethylene glycol powder [facility]) 17 GM/SCOOP powder Please take 6 capfuls of MiraLAX in a 32 oz bottle of Gatorade over 2-4 hour period. The following day take 3 capfuls. On day 3 start taking 1 capful 3 times a day. Slowly cut back as needed until you have normal bowel movements. 03/03/20   Nira Conn, MD  pregabalin (LYRICA) 150 MG capsule Take 1 capsule (150 mg total) by mouth 2 (two) times daily. Take after 1 week of 75 mg BID 02/21/20   Lovorn, Aundra Millet, MD  pregabalin (LYRICA) 75 MG capsule Take 1 capsule (75 mg total) by mouth 2 (two) times daily. 1x 1week- can take with gabapentin for now- will wean gabapentin after dose is effective. 02/21/20 02/20/21  Lovorn, Aundra Millet, MD  PROMETHEGAN 25 MG suppository UNWRAP AND INSERT 1 SUPPOSITORY(25 MG) RECTALLY EVERY 6 HOURS AS NEEDED FOR NAUSEA OR VOMITING  01/30/20   Lovorn, Aundra Millet, MD  PROMETHEGAN 25 MG suppository UNWRAP AND INSERT 1 SUPPOSITORY(25 MG) RECTALLY EVERY 6 HOURS AS NEEDED FOR NAUSEA OR VOMITING 01/30/20   Lovorn, Aundra Millet, MD  senna (SENOKOT) 8.6 MG TABS tablet Take 1 tablet (8.6 mg total) by mouth 2 (two) times daily. 11/08/19   Love, Evlyn Kanner, PA-C  sucralfate (CARAFATE) 1 GM/10ML suspension Take 10 mLs (1 g total) by mouth 4 (four) times daily -  with meals and at bedtime. 01/19/20   Jae Dire, MD  tamsulosin (FLOMAX) 0.4 MG CAPS capsule Take 2 capsules (0.8 mg total) by mouth daily after supper. 11/08/19   Love, Evlyn Kanner, PA-C  tapentadol HCl (NUCYNTA) 75 MG tablet Take 1 tablet (75 mg total) by mouth every 6 (six) hours as needed for moderate pain (for nerve pain). Patient taking differently: Take 75 mg by mouth every 6 (six) hours as needed for moderate pain (for nerve pain). Takes in am 02/10/20   Lovorn, Aundra Millet, MD  traMADol (ULTRAM) 50 MG tablet Take 1 tablet (50 mg total) by mouth every 6 (six) hours as needed for moderate pain. 11/28/19   Lovorn, Aundra Millet, MD  traZODone (DESYREL) 50 MG tablet Take 2 tablets (100 mg total) by mouth at bedtime. 01/19/20   Jae Dire, MD    Allergies    Penicillins  Review of Systems   Review of Systems  Constitutional: Negative for chills, diaphoresis, fatigue and fever.  HENT: Negative for congestion.   Eyes: Negative for visual disturbance.  Respiratory: Negative for cough, chest tightness, shortness of breath and wheezing.   Cardiovascular: Negative for chest pain, palpitations and leg swelling.  Gastrointestinal: Positive for abdominal pain, constipation, diarrhea, nausea and vomiting.  Genitourinary: Positive for decreased urine volume. Negative for dysuria.       Suprapubic catheter in place  Musculoskeletal: Negative for back pain, neck pain and neck stiffness.  Skin: Negative for rash.  Neurological: Negative for light-headedness and headaches.  Psychiatric/Behavioral: Negative for  agitation.  All other systems reviewed and are negative.   Physical Exam Updated Vital Signs BP (!) 184/104 (BP Location: Left Arm)   Pulse 73   Temp 98.3 F (  36.8 C) (Oral)   Resp 18   SpO2 100%   Physical Exam Vitals and nursing note reviewed.  Constitutional:      General: He is not in acute distress.    Appearance: He is well-developed. He is ill-appearing. He is not toxic-appearing or diaphoretic.  HENT:     Head: Normocephalic and atraumatic.  Eyes:     General: No scleral icterus.    Conjunctiva/sclera: Conjunctivae normal.  Cardiovascular:     Rate and Rhythm: Normal rate and regular rhythm.     Heart sounds: Normal heart sounds. No murmur.  Pulmonary:     Effort: Pulmonary effort is normal. No respiratory distress.     Breath sounds: Normal breath sounds. No wheezing, rhonchi or rales.  Chest:     Chest wall: No tenderness.  Abdominal:     General: Abdomen is flat. Bowel sounds are normal. There is no distension.     Palpations: Abdomen is soft.     Tenderness: There is generalized abdominal tenderness. There is no guarding or rebound.  Musculoskeletal:     Cervical back: Neck supple.  Skin:    General: Skin is warm and dry.     Capillary Refill: Capillary refill takes less than 2 seconds.     Coloration: Skin is not pale.  Neurological:     General: No focal deficit present.     Mental Status: He is alert.  Psychiatric:        Mood and Affect: Mood normal.     ED Results / Procedures / Treatments   Labs (all labs ordered are listed, but only abnormal results are displayed) Labs Reviewed  COMPREHENSIVE METABOLIC PANEL - Abnormal; Notable for the following components:      Result Value   Glucose, Bld 113 (*)    Total Protein 6.1 (*)    AST 13 (*)    All other components within normal limits  CBC - Abnormal; Notable for the following components:   RBC 3.88 (*)    Hemoglobin 11.7 (*)    HCT 35.9 (*)    All other components within normal limits    URINALYSIS, ROUTINE W REFLEX MICROSCOPIC - Abnormal; Notable for the following components:   Ketones, ur 5 (*)    Nitrite POSITIVE (*)    Leukocytes,Ua SMALL (*)    Bacteria, UA RARE (*)    All other components within normal limits  URINE CULTURE  LIPASE, BLOOD    EKG None  Radiology CT ABDOMEN PELVIS W CONTRAST  Result Date: 03/05/2020 CLINICAL DATA:  Severe abdominal pain. EXAM: CT ABDOMEN AND PELVIS WITH CONTRAST TECHNIQUE: Multidetector CT imaging of the abdomen and pelvis was performed using the standard protocol following bolus administration of intravenous contrast. CONTRAST:  OMNIPAQUE IOHEXOL 300 MG/ML  SOLN COMPARISON:  03/03/2020 FINDINGS: Lower chest: Streaky basilar atelectasis but no infiltrates or effusions. No pneumothorax. The heart is normal in size. No pericardial effusion. Hepatobiliary: No focal hepatic lesions or intrahepatic biliary dilatation. The portal veins are patent. The gallbladder appears normal. No common bile duct dilatation. Pancreas: No mass, inflammation or ductal dilatation. Spleen: Normal size. No focal lesions. Adrenals/Urinary Tract: The adrenal glands and kidneys are unremarkable and stable. Slightly thick-walled bladder contains a suprapubic catheter. Small amount of air adjacent to the bladder is again demonstrated and likely related to the catheter placement. Stomach/Bowel: The stomach, duodenum, small bowel and colon are grossly normal without oral contrast. No obvious inflammatory process, mass lesions or obstructive findings. Vascular/Lymphatic:  Stable age advanced vascular calcifications. No aneurysm or dissection. Small scattered mesenteric and retroperitoneal lymph nodes but no mass or adenopathy. Reproductive: The prostate gland and seminal vesicles are unremarkable. Other: No free abdominal/pelvic fluid collections. Mild residual body wall edema. Musculoskeletal: Stable surgical changes from stabilization of a L1 burst fracture. Stable  retropulsed fragment and wide decompressive laminectomy. IMPRESSION: 1. No acute abdominal/pelvic findings, mass lesions or adenopathy. 2. Stable surgical changes from stabilization of a L1 burst fracture. Electronically Signed   By: Marijo Sanes M.D.   On: 03/05/2020 14:34    Procedures Procedures (including critical care time)  Medications Ordered in ED Medications  sodium chloride (PF) 0.9 % injection (has no administration in time range)  sodium chloride flush (NS) 0.9 % injection 3 mL (3 mLs Intravenous Given 03/05/20 1249)  sodium chloride 0.9 % bolus 1,000 mL (0 mLs Intravenous Stopped 03/05/20 1445)  promethazine (PHENERGAN) injection 12.5 mg (12.5 mg Intravenous Given 03/05/20 1247)  HYDROmorphone (DILAUDID) injection 1 mg (1 mg Intravenous Given 03/05/20 1248)  iohexol (OMNIPAQUE) 300 MG/ML solution 100 mL (100 mLs Intravenous Contrast Given 03/05/20 1354)    ED Course  I have reviewed the triage vital signs and the nursing notes.  Pertinent labs & imaging results that were available during my care of the patient were reviewed by me and considered in my medical decision making (see chart for details).    MDM Rules/Calculators/A&P                      Louis Jordan is a 64 y.o. male with a past medical history significant for prior neurogenic bladder from traumatic cord injury with recent suprapubic catheter placement, GERD, prior ileus, and prior appendicitis who presents with recurrent and worsened abdominal pain.  Patient reports that he was seen several days ago for severe abdominal pain and had CT scan which did not show acute obstruction but did show transient intussusception.  He reports at discharge, his abdominal pain is continued to worsen and he is now having nonstop nausea vomiting and dry heaving.  He reports that he had constipation on the scan and has been using MiraLAX, stool softeners, without significant relief.  He says that he is having some watery stool but no  large stool is coming out.  He reports he feels his urine is decreased in his suprapubic catheter.  He reports no fevers, chills, chest pain, shortness of breath.  He denies any recent trauma.  He reports his pain is 10 out of 10 and he is getting dehydrated.  On exam, abdomen is exquisitely tender all over his abdomen worse in the upper abdomen.  Suprapubic catheter appears to be in place with minimal tenderness at the suprapubic catheter site.  Urine is draining.  Lungs are clear and chest is nontender.  No murmur.  Good pulses in extremities.  Clinically I am concerned of patient developing bowel obstruction or this intussusception may have worsened.  We will get repeat scan as well as give patient pain medicine, nausea medicine, and fluids.  Will get labs and urinalysis.  Anticipate reassessment after work-up.  Laboratory testing showed reassuring CBC and CMP.  Lipase nonelevated.  Urinalysis showed nitrites, leukocytes, and bacteria however there were no white seen.  Culture sent.  CT scan shows no acute abdominal/pelvic findings mass lesions or adenopathy.  Specifically, no intussusception or obstruction.  3:46 PM Just had a shared decision-making conversation with patient, wife, and the patient's spine doctor who  was on the phone on speaker phone together and the primary doctor does feel that the urine results of nitrites, leukocytes and bacteria without fever, leukocytosis, and recent culture that did not show acute infection are more consistent with colonization than UTI.  They agree with holding on antibiotics and following culture.  Patient's family does say that he was on Protonix previously but ran out of this and thinks this may be contributing.  We will start him on Protonix and follow-up with PCP and spine team.  Patient and family agree with plan of care and patient will be discharged for outpatient follow-up.   Final Clinical Impression(s) / ED Diagnoses Final diagnoses:  Nausea  vomiting and diarrhea  Epigastric pain    Rx / DC Orders ED Discharge Orders         Ordered    pantoprazole (PROTONIX) 40 MG tablet  Daily     03/05/20 1548          Clinical Impression: 1. Nausea vomiting and diarrhea   2. Epigastric pain     Disposition: Discharge  Condition: Good  I have discussed the results, Dx and Tx plan with the pt(& family if present). He/she/they expressed understanding and agree(s) with the plan. Discharge instructions discussed at great length. Strict return precautions discussed and pt &/or family have verbalized understanding of the instructions. No further questions at time of discharge.    New Prescriptions   PANTOPRAZOLE (PROTONIX) 40 MG TABLET    Take 1 tablet (40 mg total) by mouth daily.    Follow Up: Elijio MilesWeaver, John W., MD [address] [place] [id number] [phone]     [facility] [address] [id number] [place] [id number] [phone]    Your gastroenterologist        Trevis Eden, Canary Brimhristopher J, MD 03/05/20 1550

## 2020-03-05 NOTE — Discharge Instructions (Signed)
Your work-up today was overall reassuring.  We did send a culture on the urine that likely showed colonization.  Please follow-up on these results with your primary doctor and your spine team.  After our shared decision-making conversation, we agreed to treat you with Protonix for likely reflux and more stomach related symptoms after your repeat and reassuring CT scan.  Please follow-up with them and if any symptoms change or worsen, please return to nearest emergency department.

## 2020-03-05 NOTE — Telephone Encounter (Signed)
Patient called and left message on clinical line requesting a Call back from provider.  Noted that the patient is also currently admitted in [facility].

## 2020-03-05 NOTE — Telephone Encounter (Signed)
Called and spoke to pt- he was at ER also spoke to ER doctor and made some recs for treating pain. Epigastric pain- protonix is needed- he has some zofran and phenergan at home- ER doc giving him protonix for now.  Will see Friday

## 2020-03-06 LAB — URINE CULTURE: Culture: 10000 — AB

## 2020-03-07 ENCOUNTER — Telehealth: Payer: Self-pay | Admitting: *Deleted

## 2020-03-07 LAB — URINE CULTURE: Culture: >=100000 — AB

## 2020-03-07 NOTE — Telephone Encounter (Signed)
Post ED Visit - Positive Culture Follow-up  Culture report reviewed by antimicrobial stewardship pharmacist: [facility] []  , Pharm.D. []  Enzo Bi, Pharm.D., BCPS AQ-ID []  , Pharm.D., BCPS []  Celedonio Miyamoto, Pharm.D., BCPS []  [place], Garvin Fila.D., BCPS, AAHIVP []  , Pharm.D., BCPS, AAHIVP []  Georgina Pillion, PharmD, BCPS []  , PharmD, BCPS []  [place], PharmD, BCPS []  [address], PharmD []  , PharmD, BCPS []  Estella Husk, PharmD , [facility] []  , PharmD []  Phillips Climes, PharmD []  , PharmD []  Agapito Games, Rph []  ) Verlan Friends, PharmD []  , PharmD []  Mervyn Gay, PharmD []  , PharmD []  Vinnie Level, PharmD []  Malachi Bonds, PharmD []  Retta Diones, PharmD []  , PharmD []  Len Childs, PharmD   Positive urine culture Likely colonization, and no further patient follow-up is required at this time.  [facility] 03/07/2020, 11:25 AM

## 2020-03-08 ENCOUNTER — Telehealth: Payer: Self-pay

## 2020-03-08 NOTE — Telephone Encounter (Signed)
Post ED Visit - Positive Culture Follow-up  Culture report reviewed by antimicrobial stewardship pharmacist: [facility] []  , Pharm.D. []  Enzo Bi, Pharm.D., BCPS AQ-ID []  , Pharm.D., BCPS []  Celedonio Miyamoto, Pharm.D., BCPS []  [place], Garvin Fila.D., BCPS, AAHIVP []  , Pharm.D., BCPS, AAHIVP []  Georgina Pillion, PharmD, BCPS []  , PharmD, BCPS []  [place], PharmD, BCPS []  [address], PharmD []  , PharmD, BCPS []  Estella Husk, PharmD  Pharmacy Team []  Lysle Pearl, PharmD []  , PharmD []  Phillips Climes, PharmD []  , Rph []  Agapito Games) , PharmD []  Verlan Friends, PharmD []  , PharmD []  Mervyn Gay, PharmD []  , PharmD []  Vinnie Level, PharmD []  [facility], PharmD []  , PharmD [x]  Len Childs, PharmD   Positive urine culture  no further patient follow-up is required at this time.  03/08/2020, 11:19 AM

## 2020-03-09 ENCOUNTER — Other Ambulatory Visit: Payer: Self-pay

## 2020-03-09 ENCOUNTER — Encounter
Payer: Commercial Managed Care - PPO | Attending: Physical Medicine and Rehabilitation | Admitting: Physical Medicine and Rehabilitation

## 2020-03-09 ENCOUNTER — Encounter: Payer: Self-pay | Admitting: Physical Medicine and Rehabilitation

## 2020-03-09 VITALS — BP 123/82 | HR 63 | Temp 98.1°F | Ht 72.0 in | Wt 133.0 lb

## 2020-03-09 DIAGNOSIS — F4321 Adjustment disorder with depressed mood: Secondary | ICD-10-CM | POA: Diagnosis not present

## 2020-03-09 DIAGNOSIS — G834 Cauda equina syndrome: Secondary | ICD-10-CM | POA: Diagnosis present

## 2020-03-09 DIAGNOSIS — M792 Neuralgia and neuritis, unspecified: Secondary | ICD-10-CM | POA: Diagnosis not present

## 2020-03-09 DIAGNOSIS — S24103A Unspecified injury at T7-T10 level of thoracic spinal cord, initial encounter: Secondary | ICD-10-CM | POA: Insufficient documentation

## 2020-03-09 DIAGNOSIS — T07XXXA Unspecified multiple injuries, initial encounter: Secondary | ICD-10-CM | POA: Diagnosis present

## 2020-03-09 DIAGNOSIS — K592 Neurogenic bowel, not elsewhere classified: Secondary | ICD-10-CM

## 2020-03-09 DIAGNOSIS — N319 Neuromuscular dysfunction of bladder, unspecified: Secondary | ICD-10-CM | POA: Insufficient documentation

## 2020-03-09 DIAGNOSIS — R112 Nausea with vomiting, unspecified: Secondary | ICD-10-CM | POA: Diagnosis not present

## 2020-03-09 DIAGNOSIS — G8918 Other acute postprocedural pain: Secondary | ICD-10-CM

## 2020-03-09 DIAGNOSIS — Z5181 Encounter for therapeutic drug level monitoring: Secondary | ICD-10-CM | POA: Diagnosis present

## 2020-03-09 DIAGNOSIS — Z79899 Other long term (current) drug therapy: Secondary | ICD-10-CM | POA: Diagnosis present

## 2020-03-09 DIAGNOSIS — S343XXS Injury of cauda equina, sequela: Secondary | ICD-10-CM | POA: Diagnosis present

## 2020-03-09 DIAGNOSIS — S32001S Stable burst fracture of unspecified lumbar vertebra, sequela: Secondary | ICD-10-CM | POA: Diagnosis present

## 2020-03-09 DIAGNOSIS — N390 Urinary tract infection, site not specified: Secondary | ICD-10-CM

## 2020-03-09 MED ORDER — PREGABALIN 75 MG PO CAPS: 75.0000 mg | ORAL_CAPSULE | Freq: Two times a day (BID) | ORAL | 3 refills | Status: DC

## 2020-03-09 MED ORDER — PROMETHAZINE HCL 12.5 MG PO TABS: 12.5000 mg | ORAL_TABLET | Freq: Four times a day (QID) | ORAL | 5 refills | Status: DC | PRN

## 2020-03-09 MED ORDER — OXYCODONE HCL 5 MG PO TABS: 5.0000 mg | ORAL_TABLET | Freq: Three times a day (TID) | ORAL | 0 refills | Status: DC | PRN

## 2020-03-09 MED ORDER — ESCITALOPRAM OXALATE 20 MG PO TABS: 20.0000 mg | ORAL_TABLET | Freq: Every day | ORAL | 5 refills | Status: DC

## 2020-03-09 NOTE — Progress Notes (Signed)
Subjective:    Patient ID: Louis Jordan, male    DOB: [birth year], 64 y.o.   MRN: [id number]  HPI   Pt is a 64 yr old male with Cauda equina syndrome and neurogenic bladder, and bowel, and severe nerve pain for f/u.   Went to ER the other day 3/20 because starting to have abd pain and had received suprapubic cathter- was hurting under sternum- epigastric- really painful. Was also found to be full of stool- a stool ball.   Went back 3/22- was still having epigastric pain- felt sicker- but didn't hurt as bad. Got some IVFs and started back on Carafate and PPI. Something also to coat esophagus.  Now a little sore/tender epigastric But taking Zofran- almost daily.  And also took phenergan suppository.   "tired of being sick".   Wants to quit-   Stays at 4-5/10 all the time-  Can tolerate Lyrica 75 mg was OK- noticed improvement in pain with 75 mg Lyrica.  On Lyrica 150 mg - started to shake horrifically and couldn't tolerate Could feel insides quivering.    Sees GI 4/6  Lidoderm patch helps nerve pain- 30% improvement- and has only worn on bottom of L foot so far-   Pain meds wise Reduced muscle relaxant- robaxin 3x/day Still on gabapentin 600 mg TID Xtampza- was taking 1-2x/day Ran out early-  Thinking about Oxycodone 5 mg 3x/day and get rid of Xtampza-  Been taking tramadol ~1x/day- has a LOT left.   Thought pain in penis was catheter but once changed to Suprapubic catheter- found out it was NERVE pain. So still hurting.   Sees Urology on 3/30- for f/u. Did Botox at same time got SPC.  BMs are straight liquid now- able to hold it in between- rare accidents.  Like if sneezes real hard for example. BM 2x/day normally.  At least 3 weeks.  Watery but has lumps in it.2 laxatives and 2 stool softeners (senokot and colace) and 2 doses of Miralax daily.       Pain Inventory Average Pain 5 Pain Right Now 5 My pain is dull and tingling  In the last 24 hours, has  pain interfered with the following? General activity 10 Relation with others 10 Enjoyment of life 10 What TIME of day is your pain at its worst? evening Sleep (in general) Good  Pain is worse with: sitting and some activites Pain improves with: medication Relief from Meds: 7  Mobility walk without assistance how many minutes can you walk? 10 ability to climb steps?  yes do you drive?  no  Function employed # of hrs/week 10 what is your job? [employer] disabled: date disabled 10/15/2019 I need assistance with the following:  bathing, meal prep, household duties and shopping  Neuro/Psych bladder control problems weakness numbness tremor tingling  Prior Studies Any changes since last visit?  yes x-rays CT/MRI  Physicians involved in your care Any changes since last visit?  no Primary care . Neurologist . Neurosurgeon .Marland Kitchen     Family History  Problem Relation Age of Onset  . Heart disease Mother   . Alzheimer's disease Mother   . Pancreatic cancer Father   . High blood pressure Brother    Social History   Socioeconomic History  . Marital status: Married    Spouse name: Not on file  . Number of children: Not on file  . Years of education: Not on file  . Highest education level: Not on file  Occupational  History  . Not on file  Tobacco Use  . Smoking status: Never Smoker  . Smokeless tobacco: Never Used  Substance and Sexual Activity  . Alcohol use: Not Currently  . Drug use: Never  . Sexual activity: Not on file  Other Topics Concern  . Not on file  Social History Narrative  . Not on file   Social Determinants of Health   Financial Resource Strain:   . Difficulty of Paying Living Expenses:   Food Insecurity:   . Worried About Charity fundraiser in the Last Year:   . Arboriculturist in the Last Year:   Transportation Needs:   . Film/video editor (Medical):   Marland Kitchen Lack of Transportation (Non-Medical):   Physical Activity:   . Days of Exercise per  Week:   . Minutes of Exercise per Session:   Stress:   . Feeling of Stress :   Social Connections:   . Frequency of Communication with Friends and Family:   . Frequency of Social Gatherings with Friends and Family:   . Attends Religious Services:   . Active Member of Clubs or Organizations:   . Attends Archivist Meetings:   Marland Kitchen Marital Status:    Past Surgical History:  Procedure Laterality Date  . APPENDECTOMY  1964  . BOTOX INJECTION N/A 02/28/2020   Procedure: CYSTOSCOPY BOTOX 100 UNITS INJECTION;  Surgeon: Bjorn Loser, MD;  Location: [facility];  Service: Urology;  Laterality: N/A;  . CERVICAL FUSION  2000   c 5 to c 7   . EYE SURGERY Bilateral 1962   eye muscle sx  . INSERTION OF SUPRAPUBIC CATHETER N/A 02/28/2020   Procedure: INSERTION OF SUPRAPUBIC CATHETER;  Surgeon: Bjorn Loser, MD;  Location: [facility];  Service: Urology;  Laterality: N/A;  . KNEE SURGERY Right   . LUMBAR SPINE SURGERY  10/16/2019   L1 burst fracture with epidural hematoma and canal stenosis  . OPEN REDUCTION INTERNAL FIXATION (ORIF) TIBIA/FIBULA FRACTURE Left 10/19/2019   Procedure: OPEN REDUCTION INTERNAL FIXATION (ORIF) TIBIA/FIBULA FRACTURE;  Surgeon: Shona Needles, MD;  Location: [facility];  Service: Orthopedics;  Laterality: Left;  . SHOULDER ARTHROSCOPY WITH BANKART REPAIR Left 10/19/2019   Procedure: SHOULDER ARTHROSCOPY WITH BANKART REPAIR, EXTENSIVE DEBRIDEMENT, LOOSE BODY REMOVAL;  Surgeon: Hiram Gash, MD;  Location: [facility];  Service: Orthopedics;  Laterality: Left;  . STRABISMUS SURGERY    . TONSILLECTOMY     Past Medical History:  Diagnosis Date  . Anemia   . Arthritis   . Bundle branch block, right   . Closed left ankle fracture   . Complication of anesthesia   . Compression fracture of lumbar vertebrae, non-traumatic ([place])   . Foley catheter in place    last changed 2 weeks ago  . GERD (gastroesophageal reflux disease)   . Hand pain     mild OA bilateral  hands  . Left knee injury 1976   requiring surgery for repair of ligaments/cartilage damage with nerve injury that took 20 years to resolve  . MVP (mitral valve prolapse)    mild no cardiologist  . Neurogenic bladder   . Neurogenic bowel   . PONV (postoperative nausea and vomiting)   . Spinal cord injury at T7-T12 level ([place]) 10/15/2019   due to accident 10-15-2019 fell 18 feet on concrete   BP 123/82   Pulse 63   Temp 98.1 F (36.7 C)   Ht 6' (1.829 m)  Wt 133 lb (60.3 kg)   SpO2 97%   BMI 18.04 kg/m   Opioid Risk Score:   Fall Risk Score:  `1  Depression screen PHQ 2/9  Depression screen PHQ 2/9 11/14/2019  Decreased Interest 0  Down, Depressed, Hopeless 2  PHQ - 2 Score 2  Altered sleeping 1  Tired, decreased energy 1  Change in appetite 0  Feeling bad or failure about yourself  3  Trouble concentrating 1  Moving slowly or fidgety/restless 3  Suicidal thoughts 0  PHQ-9 Score 11  Difficult doing work/chores Somewhat difficult    Review of Systems  Constitutional: Positive for unexpected weight change.  Gastrointestinal: Positive for constipation and diarrhea.  Neurological: Positive for tremors, weakness and numbness.       Tingling  All other systems reviewed and are negative.      Objective:   Physical Exam  Awake, alert, appropriate, accompanied by wife, NAD Irritable, depressed affect Using cane Talkative, but morose TTP over epigastric area more than diffuse Abd      Assessment & Plan:  Pt is a 64 yr old male with Cauda equina syndrome and neurogenic bladder, and bowel, and severe nerve pain for f/u.  1. Phenergan PO 12.5 mg q6 hours prn- take first and if not enough, then take Zofran as needed.  2. Know that Zofran can cause constipation.   3. Will change Oxycodone to 5 mg TID prn  4. Will use tramadol 50 mg prn- if Oxycodone wears off before next dose- can take- has a LOT left.   5. Will change Lexapro- to 20 mg  daily- take in AM.   6. Once get pain under better control, will wean Gabapentin  7. Get rid of Colace- for now- keep AS NEEDED- also make 1 of the doses of Miralax as needed- - so do Senokot 2 tabs/day and 1 dose miralax/day- the rest as needed.   8.Remember, stools get looser when reduce pain meds for awhile- as well.  9. Still on Protonix 40 mg daily- might require higher dose- per GI.  10. F/u 4 weeks Usually takes 6-12 months for SCI patients to get back to a "sitting job".    I spent a total of 45 minutes on appointment- more than 30 minutes as per detailed plan.

## 2020-03-09 NOTE — Patient Instructions (Addendum)
Pt is a 64 yr old male with Cauda equina syndrome and neurogenic bladder, and bowel, and severe nerve pain for f/u.  1. Phenergan PO 12.5 mg q6 hours prn- take first and if not enough, then take Zofran as needed.  2. Know that Zofran can cause constipation.   3. Will change Oxycodone to 5 mg TID prn  4. Will use tramadol 50 mg prn- if Oxycodone wears off before next dose- can take- has a LOT left.   5. Will change Lexapro- to 20 mg daily- take in AM.   6. Once get pain under better control, will wean Gabapentin  7. Get rid of Colace- for now- keep AS NEEDED- also make 1 of the doses of Miralax as needed- - so do Senokot 2 tabs/day and 1 dose miralax/day- the rest as needed.   8.Remember, stools get looser when reduce pain meds for awhile- as well.  9. Still on Protonix 40 mg daily- might require higher dose- per GI.  10. F/u 4 weeks- Usually takes 6-12 months for SCI patients to get back to a "sitting job".

## 2020-03-11 ENCOUNTER — Encounter (HOSPITAL_COMMUNITY): Payer: Self-pay | Admitting: Emergency Medicine

## 2020-03-11 ENCOUNTER — Other Ambulatory Visit: Payer: Self-pay

## 2020-03-11 DIAGNOSIS — Y732 Prosthetic and other implants, materials and accessory gastroenterology and urology devices associated with adverse incidents: Secondary | ICD-10-CM | POA: Diagnosis not present

## 2020-03-11 DIAGNOSIS — T83010A Breakdown (mechanical) of cystostomy catheter, initial encounter: Secondary | ICD-10-CM | POA: Insufficient documentation

## 2020-03-11 DIAGNOSIS — R3989 Other symptoms and signs involving the genitourinary system: Secondary | ICD-10-CM | POA: Diagnosis not present

## 2020-03-11 NOTE — ED Triage Notes (Signed)
Patient lower abdomen is distended. Patient has suprapubic catheter and he thinks it is blocked. Patient is suppose to go to urologist on Tuesday.

## 2020-03-11 NOTE — ED Notes (Signed)
Irrigated patient suprapubic catheter and checked the balloon. The sterile water that was used returned back and the flush was smooth. The balloon inflated and deflated.

## 2020-03-12 ENCOUNTER — Emergency Department (HOSPITAL_COMMUNITY)
Admission: EM | Admit: 2020-03-12 | Discharge: 2020-03-12 | Disposition: A | Discharge status: Home / Self Care | Payer: Commercial Managed Care - PPO | Attending: Emergency Medicine | Admitting: Emergency Medicine

## 2020-03-12 DIAGNOSIS — T83010A Breakdown (mechanical) of cystostomy catheter, initial encounter: Secondary | ICD-10-CM

## 2020-03-12 LAB — CBC WITH DIFFERENTIAL/PLATELET
Abs Immature Granulocytes: 0.01 10*3/uL (ref 0.00–0.07)
Basophils Absolute: 0 10*3/uL (ref 0.0–0.1)
Basophils Relative: 1 %
Eosinophils Absolute: 0.2 10*3/uL (ref 0.0–0.5)
Eosinophils Relative: 3 %
HCT: 33.9 % — ABNORMAL LOW (ref 39.0–52.0)
Hemoglobin: 10.9 g/dL — ABNORMAL LOW (ref 13.0–17.0)
Immature Granulocytes: 0 %
Lymphocytes Relative: 23 %
Lymphs Abs: 1.4 10*3/uL (ref 0.7–4.0)
MCH: 29.9 pg (ref 26.0–34.0)
MCHC: 32.2 g/dL (ref 30.0–36.0)
MCV: 92.9 fL (ref 80.0–100.0)
Monocytes Absolute: 0.5 10*3/uL (ref 0.1–1.0)
Monocytes Relative: 7 %
Neutro Abs: 4.1 10*3/uL (ref 1.7–7.7)
Neutrophils Relative %: 66 %
Platelets: 268 10*3/uL (ref 150–400)
RBC: 3.65 MIL/uL — ABNORMAL LOW (ref 4.22–5.81)
RDW: 14.1 % (ref 11.5–15.5)
WBC: 6.2 10*3/uL (ref 4.0–10.5)
nRBC: 0 % (ref 0.0–0.2)

## 2020-03-12 LAB — COMPREHENSIVE METABOLIC PANEL
ALT: 12 U/L (ref 0–44)
AST: 13 U/L — ABNORMAL LOW (ref 15–41)
Albumin: 3.6 g/dL (ref 3.5–5.0)
Alkaline Phosphatase: 59 U/L (ref 38–126)
Anion gap: 7 (ref 5–15)
BUN: 24 mg/dL — ABNORMAL HIGH (ref 8–23)
CO2: 31 mmol/L (ref 22–32)
Calcium: 9 mg/dL (ref 8.9–10.3)
Chloride: 105 mmol/L (ref 98–111)
Creatinine, Ser: 0.87 mg/dL (ref 0.61–1.24)
GFR calc Af Amer: >60 mL/min (ref >60)
GFR calc non Af Amer: >60 mL/min (ref >60)
Glucose, Bld: 91 mg/dL (ref 70–99)
Potassium: 4.3 mmol/L (ref 3.5–5.1)
Sodium: 143 mmol/L (ref 135–145)
Total Bilirubin: 0.4 mg/dL (ref 0.3–1.2)
Total Protein: 5.8 g/dL — ABNORMAL LOW (ref 6.5–8.1)

## 2020-03-12 LAB — URINALYSIS, ROUTINE W REFLEX MICROSCOPIC
Bilirubin Urine: NEGATIVE
Glucose, UA: NEGATIVE mg/dL
Ketones, ur: NEGATIVE mg/dL
Nitrite: POSITIVE — AB
Protein, ur: 100 mg/dL — AB
RBC / HPF: >50 RBC/hpf — ABNORMAL HIGH (ref 0–5)
Specific Gravity, Urine: 1.023 (ref 1.005–1.030)
WBC, UA: >50 WBC/hpf — ABNORMAL HIGH (ref 0–5)
pH: 6 (ref 5.0–8.0)

## 2020-03-12 LAB — URINE CULTURE

## 2020-03-12 NOTE — Discharge Instructions (Signed)
Follow-up with your urologist as scheduled.  Your urine looks to be infected again but this is likely a colonization.  Your urine culture is pending. You will be called if you need to be started on antibiotics. Return to the ED with fever, vomiting, inability to urinate or any other concerns.

## 2020-03-12 NOTE — ED Provider Notes (Signed)
[facility]-EMERGENCY DEPT Provider Note   CSN: [id number] Arrival date & time: 03/11/20  2247     History Chief Complaint  Patient presents with  . Urinary Retention    suprapubic catheter clogged.    Louis Jordan is a 64 y.o. male.  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  States he had a Foley catheter intermittently for the past 5 months.  This was recently changed his suprapubic catheter almost 2 weeks ago.  He states he is consistently had 80 to 100 cc of urine output per hour but this acutely stopped around 10 AM today.  He feels like his catheter is blocked and nothing is going into the bag.  He reports the urge to urinate.  He denies any fever, chills, nausea, vomiting.  Has ongoing epigastric pain which was evaluated earlier this week.  He was told this is likely due to ulcers.  Urine culture from that time grew strep epidermidis which was thought to be a contaminant. He denies any change in bowel habits. He feels like his catheter is not draining.  It was attempted to be irrigated and triage and appeared to be draining appropriately.   The history is provided by the patient.       Past Medical History:  Diagnosis Date  . Anemia   . Arthritis   . Bundle branch block, right   . Closed left ankle fracture   . Complication of anesthesia   . Compression fracture of lumbar vertebrae, non-traumatic (HCC)   . Foley catheter in place    last changed 2 weeks ago  . GERD (gastroesophageal reflux disease)   . Hand pain    mild OA bilateral  hands  . Left knee injury 1976   requiring surgery for repair of ligaments/cartilage damage with nerve injury that took 20 years to resolve  . MVP (mitral valve prolapse)    mild no cardiologist  . Neurogenic bladder   . Neurogenic bowel   . PONV (postoperative nausea and vomiting)   . Spinal cord injury at T7-T12 level (HCC) 10/15/2019   due to accident  10-15-2019 fell 18 feet on concrete    Patient Active Problem List   Diagnosis Date Noted  . Adjustment disorder with depressed mood 01/15/2020  . Frequent UTI 01/13/2020  . Encounter for therapeutic drug monitoring 01/09/2020  . Nausea & vomiting 11/21/2019  . Ileus (HCC) 11/21/2019  . Hypoalbuminemia due to protein-calorie malnutrition (HCC)   . Acute blood loss anemia   . Postoperative pain   . Neuropathic pain   . Neurogenic bladder 10/26/2019  . Neurogenic bowel 10/26/2019  . Spinal cord injury, 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  . Shoulder dislocation, left, initial encounter   . Closed burst fracture of lumbar vertebra (HCC)   . Cauda equina spinal cord injury (HCC)   . Closed fracture of left ankle   . Fall from ladder 10/22/2019  . 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 Closed displaced trimalleolar fracture of left ankle 10/22/2019  . S/P lumbar fusion 10/16/2019    Past Surgical History:  Procedure Laterality Date  . APPENDECTOMY  1964  . BOTOX INJECTION N/A 02/28/2020   Procedure: CYSTOSCOPY BOTOX 100 UNITS INJECTION;  Surgeon: Alfredo Martinez, MD;  Location: [facility];  Service: Urology;  Laterality: N/A;  . CERVICAL FUSION  2000   c 5 to c 7   . EYE SURGERY Bilateral 1962  eye muscle sx  . INSERTION OF SUPRAPUBIC CATHETER N/A 02/28/2020   Procedure: INSERTION OF SUPRAPUBIC CATHETER;  Surgeon: Alfredo Martinez, MD;  Location: [facility] [place];  Service: Urology;  Laterality: N/A;  . KNEE SURGERY Right   . LUMBAR SPINE SURGERY  10/16/2019   L1 burst fracture with epidural hematoma and canal stenosis  . OPEN REDUCTION INTERNAL FIXATION (ORIF) TIBIA/FIBULA FRACTURE Left 10/19/2019   Procedure: OPEN REDUCTION INTERNAL FIXATION (ORIF) TIBIA/FIBULA FRACTURE;  Surgeon: Roby Lofts, MD;  Location: [facility] OR;  Service: Orthopedics;  Laterality: Left;  . SHOULDER ARTHROSCOPY WITH BANKART REPAIR Left 10/19/2019   Procedure:  SHOULDER ARTHROSCOPY WITH BANKART REPAIR, EXTENSIVE DEBRIDEMENT, LOOSE BODY REMOVAL;  Surgeon: Bjorn Pippin, MD;  Location: [facility] OR;  Service: Orthopedics;  Laterality: Left;  . STRABISMUS SURGERY    . TONSILLECTOMY         Family History  Problem Relation Age of Onset  . Heart disease Mother   . Alzheimer's disease Mother   . Pancreatic cancer Father   . High blood pressure Brother     Social History   Tobacco Use  . Smoking status: Never Smoker  . Smokeless tobacco: Never Used  Substance Use Topics  . Alcohol use: Not Currently  . Drug use: Never    Home Medications Prior to Admission medications   Medication Sig Start Date End Date Taking? Authorizing Provider  bisacodyl (DULCOLAX) 10 MG suppository Place 1 suppository (10 mg total) rectally daily after supper. Patient taking differently: Place 10 mg rectally daily as needed for moderate constipation.  11/08/19   Love, Evlyn Kanner, PA-C  docusate sodium (COLACE) 100 MG capsule Take 1 capsule (100 mg total) by mouth 2 (two) times daily. 11/08/19   Love, Evlyn Kanner, PA-C  docusate sodium (ENEMEEZ) 283 MG enema Place 1 enema (283 mg total) rectally daily. Patient taking differently: Place 1 enema rectally daily as needed for severe constipation.  12/02/19   Raulkar, Drema Pry, MD  escitalopram (LEXAPRO) 20 MG tablet Take 1 tablet (20 mg total) by mouth daily. 03/09/20   Lovorn, Aundra Millet, MD  ferrous sulfate 325 (65 FE) MG tablet Take 1 tablet (325 mg total) by mouth daily with breakfast. 11/08/19   Love, Evlyn Kanner, PA-C  gabapentin (NEURONTIN) 600 MG tablet TAKE 1 TABLET(600 MG) BY MOUTH THREE TIMES DAILY Patient taking differently: Take 600 mg by mouth 3 (three) times daily.  12/12/19   Raulkar, Drema Pry, MD  hydrocortisone (ANUSOL-HC) 2.5 % rectal cream Place rectally 3 (three) times daily. Patient not taking: Reported on 02/10/2020 11/08/19   Love, Evlyn Kanner, PA-C  lidocaine (LIDODERM) 5 % Place 1 patch onto the skin daily. Remove &  Discard patch within 12 hours or as directed by MD on bottom of L foot. Patient taking differently: Place 1 patch onto the skin as needed. Remove & Discard patch within 12 hours or as directed by MD on bottom of L foot. Takes prn 02/10/20   Lovorn, Aundra Millet, MD  lidocaine (XYLOCAINE) 2 % jelly Place 1 application into the urethra as needed (cathing). Patient not taking: Reported on 03/09/2020 11/08/19   Love, Evlyn Kanner, PA-C  LORazepam (ATIVAN) 0.5 MG tablet Take 1 tablet (0.5 mg total) by mouth 2 (two) times daily. Patient not taking: Reported on 03/09/2020 01/19/20   Jae Dire, MD  meloxicam (MOBIC) 15 MG tablet Take 15 mg by mouth daily. 02/15/20   [provider]  methocarbamol (ROBAXIN) 500 MG tablet Take 1 tablet (  500 mg total) by mouth 4 (four) times daily. 11/28/19   Lovorn, Jinny Blossom, MD  Multiple Vitamins-Minerals (MULTIVITAMIN ADULT PO) Take 1 tablet by mouth daily.    [provider]  ondansetron (ZOFRAN ODT) 4 MG disintegrating tablet Take 1 tablet (4 mg total) by mouth every 8 (eight) hours as needed. 02/02/20   Isla Pence, MD  oxyCODONE (ROXICODONE) 5 MG immediate release tablet Take 1 tablet (5 mg total) by mouth 3 (three) times daily as needed for severe pain or breakthrough pain. 03/09/20   Lovorn, Jinny Blossom, MD  oxyCODONE ER (XTAMPZA ER) 9 MG C12A Take 9 mg by mouth 2 (two) times daily. 11/28/19   Lovorn, Jinny Blossom, MD  pantoprazole (PROTONIX) 40 MG tablet Take 1 tablet (40 mg total) by mouth daily. 03/05/20   Tegeler, Gwenyth Allegra, MD  polyethylene glycol powder (MIRALAX) 17 GM/SCOOP powder Please take 6 capfuls of MiraLAX in a 32 oz bottle of Gatorade over 2-4 hour period. The following day take 3 capfuls. On day 3 start taking 1 capful 3 times a day. Slowly cut back as needed until you have normal bowel movements. Patient taking differently: Take 1 Container by mouth in the morning and at bedtime.  03/03/20   Fatima Blank, MD  pregabalin (LYRICA) 150 MG capsule Take  1 capsule (150 mg total) by mouth 2 (two) times daily. Take after 1 week of 75 mg BID Patient not taking: Reported on 03/05/2020 02/21/20   Courtney Heys, MD  pregabalin (LYRICA) 75 MG capsule Take 1 capsule (75 mg total) by mouth 2 (two) times daily. 1x 1week- can take with gabapentin for now- will wean gabapentin after dose is effective. 03/09/20 03/09/21  Lovorn, Jinny Blossom, MD  promethazine (PHENERGAN) 12.5 MG tablet Take 1 tablet (12.5 mg total) by mouth every 6 (six) hours as needed for nausea or vomiting. 03/09/20   Lovorn, Jinny Blossom, MD  PROMETHEGAN 25 MG suppository UNWRAP AND INSERT 1 SUPPOSITORY(25 MG) RECTALLY EVERY 6 HOURS AS NEEDED FOR NAUSEA OR VOMITING Patient taking differently: Place 25 mg rectally every 6 (six) hours as needed for nausea or vomiting.  01/30/20   Lovorn, Megan, MD  PROMETHEGAN 25 MG suppository UNWRAP AND INSERT 1 SUPPOSITORY(25 MG) RECTALLY EVERY 6 HOURS AS NEEDED FOR NAUSEA OR VOMITING 01/30/20   Lovorn, Jinny Blossom, MD  senna (SENOKOT) 8.6 MG TABS tablet Take 1 tablet (8.6 mg total) by mouth 2 (two) times daily. 11/08/19   Love, Ivan Anchors, PA-C  sucralfate (CARAFATE) 1 GM/10ML suspension Take 10 mLs (1 g total) by mouth 4 (four) times daily -  with meals and at bedtime. 01/19/20   Harold Hedge, MD  tamsulosin (FLOMAX) 0.4 MG CAPS capsule Take 2 capsules (0.8 mg total) by mouth daily after supper. 11/08/19   Love, Ivan Anchors, PA-C  traMADol (ULTRAM) 50 MG tablet Take 1 tablet (50 mg total) by mouth every 6 (six) hours as needed for moderate pain. 11/28/19   Lovorn, Jinny Blossom, MD  traZODone (DESYREL) 50 MG tablet Take 2 tablets (100 mg total) by mouth at bedtime. Patient taking differently: Take 50 mg by mouth at bedtime.  01/19/20   Harold Hedge, MD  trimethoprim (TRIMPEX) 100 MG tablet Take 100 mg by mouth daily. 03/01/20   [provider]    Allergies    Penicillins  Review of Systems   Review of Systems  Constitutional: Negative for activity change, appetite change, fatigue and  fever.  HENT: Negative for congestion and rhinorrhea.   Eyes: Negative for  visual disturbance.  Respiratory: Negative for cough, chest tightness and shortness of breath.   Cardiovascular: Negative for chest pain.  Gastrointestinal: Positive for abdominal pain. Negative for nausea and vomiting.  Genitourinary: Positive for decreased urine volume and difficulty urinating. Negative for discharge, dysuria and hematuria.  Musculoskeletal: Negative for arthralgias, back pain and myalgias.  Skin: Negative for rash.  Neurological: Negative for weakness and headaches.   all other systems are negative except as noted in the HPI and PMH.    Physical Exam Updated Vital Signs BP (!) 189/109 (BP Location: Right Arm)   Pulse 60   Temp 98.5 F (36.9 C) (Oral)   Resp 17   Ht 6' (1.829 m)   Wt 61.2 kg   SpO2 100%   BMI 18.31 kg/m   Physical Exam Vitals and nursing note reviewed.  Constitutional:      General: He is not in acute distress.    Appearance: He is well-developed.  HENT:     Head: Normocephalic and atraumatic.     Mouth/Throat:     Pharynx: No oropharyngeal exudate.  Eyes:     Conjunctiva/sclera: Conjunctivae normal.     Pupils: Pupils are equal, round, and reactive to light.  Neck:     Comments: No meningismus. Cardiovascular:     Rate and Rhythm: Normal rate and regular rhythm.     Heart sounds: Normal heart sounds. No murmur.  Pulmonary:     Effort: Pulmonary effort is normal. No respiratory distress.     Breath sounds: Normal breath sounds.  Abdominal:     Palpations: Abdomen is soft.     Tenderness: There is abdominal tenderness. There is no guarding or rebound.     Comments: Mild epigastric tenderness  Suprapubic catheter in place without surrounding erythema or drainage. Dark urine in Foley bag.  Musculoskeletal:        General: No tenderness. Normal range of motion.     Cervical back: Normal range of motion and neck supple.  Skin:    General: Skin is warm.    Neurological:     Mental Status: He is alert and oriented to person, place, and time.     Cranial Nerves: No cranial nerve deficit.     Motor: No abnormal muscle tone.     Coordination: Coordination normal.     Comments: No ataxia on finger to nose bilaterally. No pronator drift. 5/5 strength throughout. CN 2-12 intact.Equal grip strength. Sensation intact.   Psychiatric:        Behavior: Behavior normal.     ED Results / Procedures / Treatments   Labs (all labs ordered are listed, but only abnormal results are displayed) Labs Reviewed  CBC WITH DIFFERENTIAL/PLATELET - Abnormal; Notable for the following components:      Result Value   RBC 3.65 (*)    Hemoglobin 10.9 (*)    HCT 33.9 (*)    All other components within normal limits  COMPREHENSIVE METABOLIC PANEL - Abnormal; Notable for the following components:   BUN 24 (*)    Total Protein 5.8 (*)    AST 13 (*)    All other components within normal limits  URINALYSIS, ROUTINE W REFLEX MICROSCOPIC - Abnormal; Notable for the following components:   APPearance CLOUDY (*)    Hgb urine dipstick MODERATE (*)    Protein, ur 100 (*)    Nitrite POSITIVE (*)    Leukocytes,Ua LARGE (*)    RBC / HPF >50 (*)    WBC,  UA >50 (*)    Bacteria, UA RARE (*)    All other components within normal limits  URINE CULTURE    EKG None  Radiology No results found.  Procedures Procedures (including critical care time)  Medications Ordered in ED Medications - No data to display  ED Course  I have reviewed the triage vital signs and the nursing notes.  Pertinent labs & imaging results that were available during my care of the patient were reviewed by me and considered in my medical decision making (see chart for details).    MDM Rules/Calculators/A&P                      Patient with suprapubic catheter here with decreased output since 10 AM.  Denies pain, fever or vomiting.  Bladder scan is 0 mL of urine.  While waiting, patient  did have "gush" of urine.  Bag went from 550 cc 300 cc.  His blood pressure has resolved.  Suspect he may have had a clot or sediment blockage.  Nursing staff able to flush catheter without difficulty. Creatinine is at baseline.  Urinalysis is positive for infection but suspect patient is colonized.  He has no fever or vomiting or signs of UTI.  Will send culture.  He agrees to not start antibiotics at this point.  He does take prophylactic Bactrim.  He sees his urologist in 2 days. Urine cultures from March 16, March 20 March 22 all grew Staphylococcus epidermidis which is likely contaminant. He declines any additional abdominal imaging today and feels back to baseline.  Results discussed with Dr. Marlou PorchHerrick of urology.  He agrees with outpatient follow-up as scheduled.  Patient without any symptoms of UTI will hold antibiotics while culture is pending.  He has follow-up with urology in 2 days.  Suprapubic catheter draining appropriately at this point.  Patient feels relieved. Return precautions discussed. Final Clinical Impression(s) / ED Diagnoses Final diagnoses:  Suprapubic catheter dysfunction, initial encounter [facility](HCC)    Rx / DC Orders ED Discharge Orders    None       Ignacia Gentzler, Jeannett SeniorStephen, MD 03/12/20 [phone]

## 2020-03-12 NOTE — ED Notes (Signed)
Attempted to page urology x7 between pager and answering service. since 00:15. No return calll as of this time. MD, charge RN, and AC all made aware. 

## 2020-03-12 NOTE — ED Notes (Signed)
Verbalized understanding discharge instructions and follow up. In no acute distress.  Refused wheelchair.

## 2020-03-23 ENCOUNTER — Other Ambulatory Visit: Payer: Self-pay | Admitting: Physical Medicine and Rehabilitation

## 2020-04-03 NOTE — Telephone Encounter (Signed)
Close encounter 

## 2020-04-13 ENCOUNTER — Encounter: Payer: Commercial Managed Care - PPO | Admitting: Physical Medicine and Rehabilitation

## 2020-04-16 ENCOUNTER — Other Ambulatory Visit: Payer: Self-pay | Admitting: Physical Medicine and Rehabilitation

## 2020-04-16 ENCOUNTER — Telehealth: Payer: Self-pay

## 2020-04-16 NOTE — Telephone Encounter (Signed)
Cammie from [facility] called stating call back to discuss alternatives to medication [phone] ext [phone]

## 2020-04-18 NOTE — Telephone Encounter (Signed)
I called Louis Jordan from [facility] back. Alternatives for pain medication since Nucynta is plan exclusive. Hydromorphone, Oxymorphone, Oxycodone, Codiene, Morphine are covered.

## 2020-04-18 NOTE — Telephone Encounter (Signed)
Unfortunately, none of those help NERVE pain, which is what I need to treat. I wish they'd at least cover methadone.

## 2020-04-20 ENCOUNTER — Encounter: Payer: Self-pay | Admitting: Physical Medicine and Rehabilitation

## 2020-04-20 ENCOUNTER — Encounter
Payer: Commercial Managed Care - PPO | Attending: Physical Medicine and Rehabilitation | Admitting: Physical Medicine and Rehabilitation

## 2020-04-20 ENCOUNTER — Other Ambulatory Visit: Payer: Self-pay

## 2020-04-20 VITALS — BP 132/70 | HR 61 | Temp 98.2°F | Ht 72.0 in | Wt 141.4 lb

## 2020-04-20 DIAGNOSIS — Z79899 Other long term (current) drug therapy: Secondary | ICD-10-CM | POA: Insufficient documentation

## 2020-04-20 DIAGNOSIS — G834 Cauda equina syndrome: Secondary | ICD-10-CM | POA: Diagnosis present

## 2020-04-20 DIAGNOSIS — K592 Neurogenic bowel, not elsewhere classified: Secondary | ICD-10-CM | POA: Diagnosis not present

## 2020-04-20 DIAGNOSIS — Z5181 Encounter for therapeutic drug level monitoring: Secondary | ICD-10-CM | POA: Insufficient documentation

## 2020-04-20 DIAGNOSIS — S343XXS Injury of cauda equina, sequela: Secondary | ICD-10-CM | POA: Insufficient documentation

## 2020-04-20 DIAGNOSIS — M792 Neuralgia and neuritis, unspecified: Secondary | ICD-10-CM

## 2020-04-20 DIAGNOSIS — G8918 Other acute postprocedural pain: Secondary | ICD-10-CM

## 2020-04-20 DIAGNOSIS — T07XXXA Unspecified multiple injuries, initial encounter: Secondary | ICD-10-CM | POA: Insufficient documentation

## 2020-04-20 DIAGNOSIS — S24103A Unspecified injury at T7-T10 level of thoracic spinal cord, initial encounter: Secondary | ICD-10-CM | POA: Insufficient documentation

## 2020-04-20 DIAGNOSIS — S32001S Stable burst fracture of unspecified lumbar vertebra, sequela: Secondary | ICD-10-CM | POA: Insufficient documentation

## 2020-04-20 DIAGNOSIS — N319 Neuromuscular dysfunction of bladder, unspecified: Secondary | ICD-10-CM | POA: Diagnosis not present

## 2020-04-20 MED ORDER — OXYCODONE HCL 5 MG PO TABS: 5.0000 mg | ORAL_TABLET | Freq: Four times a day (QID) | ORAL | 0 refills | Status: DC | PRN

## 2020-04-20 MED ORDER — LEVETIRACETAM 500 MG PO TABS: 500.0000 mg | ORAL_TABLET | Freq: Two times a day (BID) | ORAL | 5 refills | Status: DC

## 2020-04-20 NOTE — Patient Instructions (Signed)
Pt is a 64 yr old male with Cauda equina syndrome and neurogenic bladder, and bowel, and severe nerve pain which is impairing function for f/u.    1. Large toe box shoes- shoes that are made for diabetes always have large toe box. Can try toe straighteners as well *search for hammer toes)   2. Can use lidocaine cream on toes.  Might help a little.   3. Needs to get pt Nucynta somehow- only other option at this point.   4. Accupuncture- reasonable try.  If not, can try Dry needling.    5. Renew Oxycodone 5 mg QID for pain- would rather use Nucynta- but don't have ability right now.   6. Don't retry Cymbalta- made too sick and Lyrica- made shaky.   7. Try Keppra 500 mg BID x 1 week then 1000 mg BID - if gets irritable, can take Vit B complex over the counter.    8. F/U in 6 weeks- wean Lyrica

## 2020-04-20 NOTE — Progress Notes (Signed)
Subjective:    Patient ID: Louis Jordan, male    DOB: May 21, 1956, 64 y.o.   MRN: [id number]  HPI Pt is a 64 yr old male with Cauda equina syndrome and neurogenic bladder, and bowel, and severe nerve pain for f/u.  Is 68- can get Medicare-  Covers Nucynta. For $40.   Insurance at work is covered for free.   Nothing is helping him. Current regimen only bring pain much at all- pain is WORSE than when saw him last-  Never gets less than 6-7/10- not laying in bed as much.  Is nerve pain  Pain is just in side of hand.  Putting patches in butt- because hurts so bad- might take it down 1/2 notch on pain scale- a hard chair is torture.  Has to use soft cushion from w/c Using lidocaine patches on back of thighs- doesn't help much maybe 1/2 notch.    Feet hurt all the time. And toes curling a lot- and makes wearing shoes painful.  And swollen. Rubbing on shoes, so very painful and impossible to stand more than 30 minutes at a time.  Walking is worse. Walking hurts so bad  Shoes is stiff at end and keeps toes from moving. Helps a little bit, but not much.    Has feeling in penis/scrotum But not inner buttocks and back of thighs but causing so much PAIN. Make sitting impossible for more than length of a meal 30-60 minutes then has to lay back down  And sleeps on side.     Pt, Will call and check on Medicare.   Accupuncture- discussed with someone.  Was helpful for someone else.   The best schedule of pain meds- Oxycodone 5 mg 4x/day.  Off the long acting.  Still in pain around 6-7/10- even with pain meds.   Quality of life so bad, wondering about how to change it.    Fills bladder up- with plug/suprapubic catheter- can only pee 50-100cc at a time.  Strains helps- only 4-6x total.  Volume has to be high.   Spasms in rectum- getting new.  Seeing Dr Thomasene Mohair- [facility], [place] Stopped seeing Dr Matilde Sprang. On Flomax still.   Bowels- rare accidents- "correct consistency"  .      Pain Inventory Average Pain 7 Pain Right Now 6 My pain is dull and tingling  In the last 24 hours, has pain interfered with the following? General activity 8 Relation with others 6 Enjoyment of life 8 What TIME of day is your pain at its worst? morning and evening Sleep (in general) Good  Pain is worse with: walking and inactivity Pain improves with: medication Relief from Meds: 5  Mobility walk without assistance ability to climb steps?  yes do you drive?  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  no  Physicians involved in your care urology   Family History  Problem Relation Age of Onset  . Heart disease Mother   . Alzheimer's disease Mother   . Pancreatic cancer Father   . High blood pressure Brother    Social History   Socioeconomic History  . Marital status: Married    Spouse name: Not on file  . Number of children: Not on file  . Years of education: Not on file  . Highest education level: Not on file  Occupational History  .  Not on file  Tobacco Use  . Smoking status: Never Smoker  . Smokeless tobacco: Never Used  Substance and Sexual Activity  . Alcohol use: Not Currently  . Drug use: Never  . Sexual activity: Not on file  Other Topics Concern  . Not on file  Social History Narrative  . Not on file   Social Determinants of Health   Financial Resource Strain:   . Difficulty of Paying Living Expenses:   Food Insecurity:   . Worried About Programme researcher, broadcasting/film/video in the Last Year:   . Barista in the Last Year:   Transportation Needs:   . Freight forwarder (Medical):   Marland Kitchen Lack of Transportation (Non-Medical):   Physical Activity:   . Days of Exercise per Week:   . Minutes of Exercise per Session:   Stress:   . Feeling of Stress :   Social Connections:    . Frequency of Communication with Friends and Family:   . Frequency of Social Gatherings with Friends and Family:   . Attends Religious Services:   . Active Member of Clubs or Organizations:   . Attends Banker Meetings:   Marland Kitchen Marital Status:    Past Surgical History:  Procedure Laterality Date  . APPENDECTOMY  1964  . BOTOX INJECTION N/A 02/28/2020   Procedure: CYSTOSCOPY BOTOX 100 UNITS INJECTION;  Surgeon: Alfredo Martinez, MD;  Location: [facility];  Service: Urology;  Laterality: N/A;  . CERVICAL FUSION  2000   c 5 to c 7   . EYE SURGERY Bilateral 1962   eye muscle sx  . INSERTION OF SUPRAPUBIC CATHETER N/A 02/28/2020   Procedure: INSERTION OF SUPRAPUBIC CATHETER;  Surgeon: Alfredo Martinez, MD;  Location: [facility];  Service: Urology;  Laterality: N/A;  . KNEE SURGERY Right   . LUMBAR SPINE SURGERY  10/16/2019   L1 burst fracture with epidural hematoma and canal stenosis  . OPEN REDUCTION INTERNAL FIXATION (ORIF) TIBIA/FIBULA FRACTURE Left 10/19/2019   Procedure: OPEN REDUCTION INTERNAL FIXATION (ORIF) TIBIA/FIBULA FRACTURE;  Surgeon: Roby Lofts, MD;  Location: [facility];  Service: Orthopedics;  Laterality: Left;  . SHOULDER ARTHROSCOPY WITH BANKART REPAIR Left 10/19/2019   Procedure: SHOULDER ARTHROSCOPY WITH BANKART REPAIR, EXTENSIVE DEBRIDEMENT, LOOSE BODY REMOVAL;  Surgeon: Bjorn Pippin, MD;  Location: [facility];  Service: Orthopedics;  Laterality: Left;  . STRABISMUS SURGERY    . TONSILLECTOMY     Past Medical History:  Diagnosis Date  . Anemia   . Arthritis   . Bundle branch block, right   . Closed left ankle fracture   . Complication of anesthesia   . Compression fracture of lumbar vertebrae, non-traumatic (HCC)   . Foley catheter in place    last changed 2 weeks ago  . GERD (gastroesophageal reflux disease)   . Hand pain    mild OA bilateral  hands  . Left knee injury 1976   requiring surgery for repair of  ligaments/cartilage damage with nerve injury that took 20 years to resolve  . MVP (mitral valve prolapse)    mild no cardiologist  . Neurogenic bladder   . Neurogenic bowel   . PONV (postoperative nausea and vomiting)   . Spinal cord injury at T7-T12 level (HCC) 10/15/2019   due to accident 10-15-2019 fell 18 feet on concrete   BP 132/70   Pulse 61   Temp 98.2 F (36.8 C)   Ht 6' (1.829 m)   [employer]  141 lb 6.4 oz (64.1 kg)   SpO2 95%   BMI 19.18 kg/m   Opioid Risk Score:   Fall Risk Score:  `1  Depression screen PHQ 2/9  Depression screen PHQ 2/9 11/14/2019  Decreased Interest 0  Down, Depressed, Hopeless 2  PHQ - 2 Score 2  Altered sleeping 1  Tired, decreased energy 1  Change in appetite 0  Feeling bad or failure about yourself  3  Trouble concentrating 1  Moving slowly or fidgety/restless 3  Suicidal thoughts 0  PHQ-9 Score 11  Difficult doing work/chores Somewhat difficult    Review of Systems  Neurological: Positive for weakness and numbness.       Tingling       Objective:   Physical Exam  Awake, alert, appropriate, accompanied by wife, NAD Toe curling-  B/L Can't stand on toes now without holding onto counter.   Donut around S3-S5 that's decreased sensation/but also very sensitive/TTP     Assessment & Plan:   Pt is a 64 yr old male with Cauda equina syndrome and neurogenic bladder, and bowel, and severe nerve pain which is impairing function for f/u.    1. Large toe box shoes- shoes that are made for diabetes always have large toe box. Can try toe straighteners as well *search for hammer toes)   2. Can use lidocaine cream on toes.  Might help a little.   3. Needs to get pt Nucynta somehow- only other option at this point.   4. Accupuncture- reasonable try.  If not, can try Dry needling.    5. Renew Oxycodone 5 mg QID for pain- would rather use Nucynta- but don't have ability right now.   6. Don't retry Cymbalta- made too sick and Lyrica-  made shaky.   7. Try Keppra 500 mg BID x 1 week then 1000 mg BID - if gets irritable, can take Vit B complex over the counter.    8. F/U in 6 weeks- con't bladder as it's going. Wean off Lyrica   I spent a total of 50 minutes on visit- as detailed above.  Marland Kitchen    -

## 2020-04-20 NOTE — Addendum Note (Signed)
Addended by: Genice Rouge on: 04/20/2020 03:49 PM   Modules accepted: Orders

## 2020-04-23 ENCOUNTER — Ambulatory Visit: Payer: Commercial Managed Care - PPO | Admitting: Physical Medicine and Rehabilitation

## 2020-04-27 ENCOUNTER — Telehealth: Payer: Self-pay | Admitting: *Deleted

## 2020-04-27 ENCOUNTER — Other Ambulatory Visit: Payer: Self-pay | Admitting: Physical Medicine and Rehabilitation

## 2020-04-27 MED ORDER — OXYCODONE HCL 5 MG PO TABS: 5.0000 mg | ORAL_TABLET | Freq: Three times a day (TID) | ORAL | 0 refills | Status: DC | PRN

## 2020-04-27 NOTE — Telephone Encounter (Signed)
I have sent 62 tablets to complete the one month prescription; thank you!

## 2020-04-27 NOTE — Telephone Encounter (Signed)
Mr Crilly notified.

## 2020-04-27 NOTE — Telephone Encounter (Signed)
Dr. Rodell Perna patient left a message stating his last refill he was only given a seven day supply.  He is needing a one month supply filled.  He is currently out. We were directed by Wadie Lessen to send Dr. Rodell Perna refill requests to Dr. Carlis Abbott or Dr. Riley Kill.

## 2020-05-07 ENCOUNTER — Telehealth: Payer: Self-pay | Admitting: *Deleted

## 2020-05-07 ENCOUNTER — Encounter: Payer: Self-pay | Admitting: *Deleted

## 2020-05-07 NOTE — Addendum Note (Signed)
Addended by: Angela Nevin D on: 05/07/2020 02:02 PM   Modules accepted: Orders

## 2020-05-07 NOTE — Telephone Encounter (Signed)
Patient contacted [place] today.  He states that he will run out of oxycodone by the end of this week.  He initially received a 1 week supply #28.  Dr Carlis Abbott provided a script of #62 to cover patient in Dr. Dahlia Client absence.  The patient is asking if Dr. Berline Chough could provide a script for next months script in its entire amount #120.  Side note:  Prior authorization was approved

## 2020-05-07 NOTE — Telephone Encounter (Signed)
Prior authorization submitted to [employer] for oxycodone 5 mg.  Approved  05//20/2021 - 11/03/2020

## 2020-05-08 MED ORDER — OXYCODONE HCL 5 MG PO TABS: 5.0000 mg | ORAL_TABLET | Freq: Four times a day (QID) | ORAL | 0 refills | Status: DC | PRN

## 2020-05-08 NOTE — Telephone Encounter (Signed)
Refilled Oxycodone 5/325 mg QID #120 for NEXT month, since there was a mess up on insurance end for last months' Rx

## 2020-05-08 NOTE — Telephone Encounter (Signed)
Patient notified

## 2020-05-08 NOTE — Addendum Note (Signed)
Addended by: Genice Rouge on: 05/08/2020 08:55 AM   Modules accepted: Orders

## 2020-06-04 ENCOUNTER — Encounter: Payer: Self-pay | Admitting: Physical Medicine and Rehabilitation

## 2020-06-04 ENCOUNTER — Other Ambulatory Visit: Payer: Self-pay

## 2020-06-04 ENCOUNTER — Encounter
Payer: Commercial Managed Care - PPO | Attending: Physical Medicine and Rehabilitation | Admitting: Physical Medicine and Rehabilitation

## 2020-06-04 VITALS — BP 138/74 | HR 51 | Temp 97.5°F | Ht 72.0 in | Wt 142.0 lb

## 2020-06-04 DIAGNOSIS — Z79899 Other long term (current) drug therapy: Secondary | ICD-10-CM | POA: Insufficient documentation

## 2020-06-04 DIAGNOSIS — S24103A Unspecified injury at T7-T10 level of thoracic spinal cord, initial encounter: Secondary | ICD-10-CM | POA: Diagnosis present

## 2020-06-04 DIAGNOSIS — S343XXS Injury of cauda equina, sequela: Secondary | ICD-10-CM | POA: Diagnosis present

## 2020-06-04 DIAGNOSIS — N319 Neuromuscular dysfunction of bladder, unspecified: Secondary | ICD-10-CM | POA: Insufficient documentation

## 2020-06-04 DIAGNOSIS — M792 Neuralgia and neuritis, unspecified: Secondary | ICD-10-CM

## 2020-06-04 DIAGNOSIS — T07XXXA Unspecified multiple injuries, initial encounter: Secondary | ICD-10-CM | POA: Diagnosis present

## 2020-06-04 DIAGNOSIS — Z79891 Long term (current) use of opiate analgesic: Secondary | ICD-10-CM

## 2020-06-04 DIAGNOSIS — S32001S Stable burst fracture of unspecified lumbar vertebra, sequela: Secondary | ICD-10-CM | POA: Insufficient documentation

## 2020-06-04 DIAGNOSIS — G834 Cauda equina syndrome: Secondary | ICD-10-CM | POA: Diagnosis present

## 2020-06-04 DIAGNOSIS — Z5181 Encounter for therapeutic drug level monitoring: Secondary | ICD-10-CM | POA: Diagnosis not present

## 2020-06-04 DIAGNOSIS — G894 Chronic pain syndrome: Secondary | ICD-10-CM | POA: Insufficient documentation

## 2020-06-04 MED ORDER — METHADONE HCL 5 MG PO TABS: 5.0000 mg | ORAL_TABLET | Freq: Two times a day (BID) | ORAL | 0 refills | Status: DC

## 2020-06-04 MED ORDER — SILDENAFIL CITRATE 100 MG PO TABS
100.0000 mg | ORAL_TABLET | ORAL | 11 refills | Status: DC | PRN
Start: 2020-06-04 — End: 2021-11-25

## 2020-06-04 MED ORDER — BACLOFEN 10 MG PO TABS: 5.0000 mg | ORAL_TABLET | Freq: Three times a day (TID) | ORAL | 5 refills | Status: DC

## 2020-06-04 NOTE — Progress Notes (Signed)
Subjective:    Patient ID: Louis Jordan, male    DOB: 1956-10-16, 65 y.o.   MRN: [id number]  HPI  Pt is a 64 yr old male with Cauda equina syndrome and neurogenic bladder, and bowel, and severe nerve pain which is impairing function for f/u.   Pain is getting worse. Likely 25% worse than it was.  All the time.    Hasn't done the groundwork to do Acupuncture at this point yet.     When got up to higher dose of keppra 1000 mg- within 5-10 minutes of taking medicine, couldn't stand,walk.  Stopped Keppra completely- after side effect  Part A of Medicare hasn't kicked in- Part B kicks in August 1- so then can get Nucynta at that time at a reasonable price.   Oxycodone- takes the edge off-   Starting to wake up from Trazodone- because of pain- and sleeplessness- will increase Trazodone.   Takes Robaxin 3x/day-   If bladder is full- can pee ~ 100cc by himself.   Has to strain. To pee.  Back in regular bed- out of hospital bed!!! Doesn't want to hit suprapubic so hasn't tried to have sex lately.  Has a little AM erection, but not full.      Pain Inventory Average Pain 8 Pain Right Now 7 My pain is constant, tingling, aching and numb  In the last 24 hours, has pain interfered with the following? General activity 8 Relation with others 6 Enjoyment of life 9 What TIME of day is your pain at its worst? morning Sleep (in general) Good  Pain is worse with: bending, sitting and some activites Pain improves with: medication Relief from Meds: 4  Mobility walk without assistance use a cane how many minutes can you walk? 20 ability to climb steps?  yes do you drive?  no  Function disabled: date disabled . I need assistance with the following:  meal prep, household duties and shopping  Neuro/Psych bladder control problems weakness numbness tingling  Prior Studies Any changes since last visit?  no  Physicians involved in your care Any changes since last  visit?  no   Family History  Problem Relation Age of Onset  . Heart disease Mother   . Alzheimer's disease Mother   . Pancreatic cancer Father   . High blood pressure Brother    Social History   Socioeconomic History  . Marital status: Married    Spouse name: Not on file  . Number of children: Not on file  . Years of education: Not on file  . Highest education level: Not on file  Occupational History  . Not on file  Tobacco Use  . Smoking status: Never Smoker  . Smokeless tobacco: Never Used  Vaping Use  . Vaping Use: Never used  Substance and Sexual Activity  . Alcohol use: Not Currently  . Drug use: Never  . Sexual activity: Not on file  Other Topics Concern  . Not on file  Social History Narrative  . Not on file   Social Determinants of Health   Financial Resource Strain:   . Difficulty of Paying Living Expenses:   Food Insecurity:   . Worried About Programme researcher, broadcasting/film/video in the Last Year:   . Barista in the Last Year:   Transportation Needs:   . Freight forwarder (Medical):   Marland Kitchen Lack of Transportation (Non-Medical):   Physical Activity:   . Days of Exercise per Week:   .  Minutes of Exercise per Session:   Stress:   . Feeling of Stress :   Social Connections:   . Frequency of Communication with Friends and Family:   . Frequency of Social Gatherings with Friends and Family:   . Attends Religious Services:   . Active Member of Clubs or Organizations:   . Attends Banker Meetings:   Marland Kitchen Marital Status:    Past Surgical History:  Procedure Laterality Date  . APPENDECTOMY  1964  . BOTOX INJECTION N/A 02/28/2020   Procedure: CYSTOSCOPY BOTOX 100 UNITS INJECTION;  Surgeon: Alfredo Martinez, MD;  Location: [facility];  Service: Urology;  Laterality: N/A;  . CERVICAL FUSION  2000   c 5 to c 7   . EYE SURGERY Bilateral 1962   eye muscle sx  . INSERTION OF SUPRAPUBIC CATHETER N/A 02/28/2020   Procedure: INSERTION OF  SUPRAPUBIC CATHETER;  Surgeon: Alfredo Martinez, MD;  Location: [facility];  Service: Urology;  Laterality: N/A;  . KNEE SURGERY Right   . LUMBAR SPINE SURGERY  10/16/2019   L1 burst fracture with epidural hematoma and canal stenosis  . OPEN REDUCTION INTERNAL FIXATION (ORIF) TIBIA/FIBULA FRACTURE Left 10/19/2019   Procedure: OPEN REDUCTION INTERNAL FIXATION (ORIF) TIBIA/FIBULA FRACTURE;  Surgeon: Roby Lofts, MD;  Location: [facility];  Service: Orthopedics;  Laterality: Left;  . SHOULDER ARTHROSCOPY WITH BANKART REPAIR Left 10/19/2019   Procedure: SHOULDER ARTHROSCOPY WITH BANKART REPAIR, EXTENSIVE DEBRIDEMENT, LOOSE BODY REMOVAL;  Surgeon: Bjorn Pippin, MD;  Location: [facility];  Service: Orthopedics;  Laterality: Left;  . STRABISMUS SURGERY    . TONSILLECTOMY     Past Medical History:  Diagnosis Date  . Anemia   . Arthritis   . Bundle branch block, right   . Closed left ankle fracture   . Complication of anesthesia   . Compression fracture of lumbar vertebrae, non-traumatic (HCC)   . Foley catheter in place    last changed 2 weeks ago  . GERD (gastroesophageal reflux disease)   . Hand pain    mild OA bilateral  hands  . Left knee injury 1976   requiring surgery for repair of ligaments/cartilage damage with nerve injury that took 20 years to resolve  . MVP (mitral valve prolapse)    mild no cardiologist  . Neurogenic bladder   . Neurogenic bowel   . PONV (postoperative nausea and vomiting)   . Spinal cord injury at T7-T12 level (HCC) 10/15/2019   due to accident 10-15-2019 fell 18 feet on concrete   There were no vitals taken for this visit.  Opioid Risk Score:   Fall Risk Score:  `1  Depression screen PHQ 2/9  Depression screen PHQ 2/9 11/14/2019  Decreased Interest 0  Down, Depressed, Hopeless 2  PHQ - 2 Score 2  Altered sleeping 1  Tired, decreased energy 1  Change in appetite 0  Feeling bad or failure about yourself  3  Trouble concentrating 1    Moving slowly or fidgety/restless 3  Suicidal thoughts 0  PHQ-9 Score 11  Difficult doing work/chores Somewhat difficult    Review of Systems  Constitutional: Negative.   HENT: Negative.   Eyes: Negative.   Respiratory: Negative.   Cardiovascular: Negative.   Gastrointestinal: Negative.   Endocrine: Negative.   Genitourinary: Positive for difficulty urinating.  Musculoskeletal: Positive for arthralgias, back pain and gait problem.  Skin: Negative.   Allergic/Immunologic: Negative.   Neurological: Positive for weakness and numbness.  Tingling  Psychiatric/Behavioral: Negative.   All other systems reviewed and are negative.      Objective:   Physical Exam Awake, alert, appropriate, accompanied by wife, NAD; on table Depressed affect- very upset that might have pain like this "forever".       Assessment & Plan:    Pt is a 64 yr old male with Cauda equina syndrome and neurogenic bladder, and bowel, and severe nerve pain which is impairing function for f/u.   1. Call 8/2 to remind me to place nucynta in prior auth for pt- before I go on vacation.  Nucynta 75 mg q6 hours prn.   2. Would continue the first Rx of Oxycodone if required-If goes to Nucynta  3. Occ takes Oxycodone sometimes 5 doses/day- when things bad.  4. Will try methadone 5 mg 2x/day-for nerve pain- 7 day supply- if works, call me- will NOT take Oxycodone during this time- have tried Lyrica, Duloxetine with bad side effects as well as Keppra with bad side effects - has low sodium so cannot try Trileptal for nerve pain- insurance won't cover [place]- have NO OTHER CHOICES. Don't take more than prescribed dose. Don't want you to drive for next week.   5. If it doesn't work at all- call me asap and I will increase Oxycodone to 6 tabs/day.   6. Methocarbamol-  stop it- when starts baclofen  7. Add Baclofen 5 mg 3x/day SCHEDULED- for muscle spasms   8. Increased bowel meds with adding Methadone and  adding Baclofen.   9. Use ,eloxicam for joint swelling, arthritis pain as needed.   10. Suprapubic tapping can help him void!!!!  11. Viagra- 100 mg as needed for sex.  Can also combine with erectile penile pump. Has ring with it- use it.   12. F/U in 8 weeks.    I spent a total of 1 hour on appointment- as detailed above.

## 2020-06-04 NOTE — Patient Instructions (Signed)
1. Call 8/2 to remind me to place nucynta in prior auth for pt- before I go on vacation.  Nucynta 75 mg q6 hours prn.   2. Would continue the first Rx of Oxycodone if required-If goes to Nucynta  3. Occ takes Oxycodone sometimes 5 doses/day- when things bad.  4. Will try methadone 5 mg 2x/day-for nerve pain- 7 day supply- if works, call me- will NOT take Oxycodone during this time- have tried Lyrica, Duloxetine with bad side effects as well as Keppra with bad side effects - has low sodium so cannot try Trileptal for nerve pain- insurance won't cover Nucynta- have NO OTHER CHOICES. Don't take more than prescribed dose. Don't want you to drive for next week.   5. If it doesn't work at all- call me asap and I will increase Oxycodone to 6 tabs/day.   6. Methocarbamol-  stop it- when starts baclofen  7. Add Baclofen 5 mg 3x/day SCHEDULED- for muscle spasms   8. Increased bowel meds with adding Methadone and adding Baclofen.   9. Use ,eloxicam for joint swelling, arthritis pain as needed.   10. Suprapubic tapping can help him void!!!!  11. Viagra- 100 mg as needed for sex.  Can also combine with erectile penile pump. Has ring with it- use it.   12. F/U in 8 weeks.

## 2020-06-08 ENCOUNTER — Other Ambulatory Visit: Payer: Self-pay | Admitting: Physical Medicine & Rehabilitation

## 2020-06-08 ENCOUNTER — Other Ambulatory Visit: Payer: Self-pay | Admitting: Physical Medicine and Rehabilitation

## 2020-06-08 LAB — DRUG TOX MONITOR 1 W/CONF, ORAL FLD
Amphetamines: NEGATIVE ng/mL (ref <10)
Barbiturates: NEGATIVE ng/mL (ref <10)
Benzodiazepines: NEGATIVE ng/mL (ref <0.50)
Buprenorphine: NEGATIVE ng/mL (ref <0.10)
Cocaine: NEGATIVE ng/mL (ref <5.0)
Codeine: NEGATIVE ng/mL (ref <2.5)
Dihydrocodeine: NEGATIVE ng/mL (ref <2.5)
Fentanyl: NEGATIVE ng/mL (ref <0.10)
Heroin Metabolite: NEGATIVE ng/mL (ref <1.0)
Hydrocodone: NEGATIVE ng/mL (ref <2.5)
Hydromorphone: NEGATIVE ng/mL (ref <2.5)
MARIJUANA: NEGATIVE ng/mL (ref <2.5)
MDMA: NEGATIVE ng/mL (ref <10)
Meprobamate: NEGATIVE ng/mL (ref <2.5)
Methadone: NEGATIVE ng/mL (ref <5.0)
Morphine: NEGATIVE ng/mL (ref <2.5)
Nicotine Metabolite: NEGATIVE ng/mL (ref <5.0)
Norhydrocodone: NEGATIVE ng/mL (ref <2.5)
Noroxycodone: 46.3 ng/mL — ABNORMAL HIGH (ref <2.5)
Opiates: POSITIVE ng/mL — AB (ref <2.5)
Oxycodone: 86.1 ng/mL — ABNORMAL HIGH (ref <2.5)
Oxymorphone: NEGATIVE ng/mL (ref <2.5)
Phencyclidine: NEGATIVE ng/mL (ref <10)
Tapentadol: NEGATIVE ng/mL (ref <5.0)
Tramadol: NEGATIVE ng/mL (ref <5.0)
Zolpidem: NEGATIVE ng/mL (ref <5.0)

## 2020-06-08 LAB — DRUG TOX ALC METAB W/CON, ORAL FLD: Alcohol Metabolite: NEGATIVE ng/mL (ref <25)

## 2020-06-08 NOTE — Telephone Encounter (Signed)
I spoke with Mr. & Mrs. Fuelling. Patient is following your instructions. He is no longer taking the Rx. Methocarbamol.

## 2020-06-11 ENCOUNTER — Telehealth: Payer: Self-pay | Admitting: *Deleted

## 2020-06-11 ENCOUNTER — Other Ambulatory Visit: Payer: Self-pay | Admitting: Physical Medicine and Rehabilitation

## 2020-06-11 MED ORDER — METHADONE HCL 5 MG PO TABS: 5.0000 mg | ORAL_TABLET | Freq: Two times a day (BID) | ORAL | 0 refills | Status: DC

## 2020-06-11 NOTE — Telephone Encounter (Signed)
Oral swab drug screen was consistent for prescribed medications.  ?

## 2020-06-14 ENCOUNTER — Other Ambulatory Visit: Payer: Self-pay | Admitting: Physical Medicine and Rehabilitation

## 2020-06-14 ENCOUNTER — Telehealth: Payer: Self-pay | Admitting: *Deleted

## 2020-06-14 MED ORDER — METHADONE HCL 5 MG PO TABS: 5.0000 mg | ORAL_TABLET | Freq: Two times a day (BID) | ORAL | 0 refills | Status: DC

## 2020-06-14 MED ORDER — OXYCODONE HCL 5 MG PO TABS: 5.0000 mg | ORAL_TABLET | Freq: Four times a day (QID) | ORAL | 0 refills | Status: DC | PRN

## 2020-06-14 NOTE — Telephone Encounter (Signed)
Dr Carlis Abbott you did not send a full Rx for Mr Espinal's methadone and he is needing a refill on that and his oxycodone.

## 2020-06-14 NOTE — Telephone Encounter (Signed)
Hi resent the last script he received. I can do that again as well as send a refill of his oxycodone, thank you!

## 2020-06-19 ENCOUNTER — Other Ambulatory Visit: Payer: Self-pay | Admitting: Physical Medicine and Rehabilitation

## 2020-06-19 MED ORDER — METHADONE HCL 5 MG PO TABS: 5.0000 mg | ORAL_TABLET | Freq: Two times a day (BID) | ORAL | 0 refills | Status: DC

## 2020-06-21 ENCOUNTER — Other Ambulatory Visit: Payer: Self-pay | Admitting: Physical Medicine and Rehabilitation

## 2020-07-23 MED ORDER — METHADONE HCL 5 MG PO TABS: 5.0000 mg | ORAL_TABLET | Freq: Two times a day (BID) | ORAL | 0 refills | Status: DC

## 2020-07-23 NOTE — Telephone Encounter (Signed)
Refilled Methadone 5 mg 2x/day- #60- no refills

## 2020-07-30 ENCOUNTER — Other Ambulatory Visit: Payer: Self-pay

## 2020-07-30 ENCOUNTER — Encounter: Payer: Self-pay | Admitting: Physical Medicine and Rehabilitation

## 2020-07-30 ENCOUNTER — Encounter
Payer: Commercial Managed Care - PPO | Attending: Physical Medicine and Rehabilitation | Admitting: Physical Medicine and Rehabilitation

## 2020-07-30 VITALS — Ht 72.0 in | Wt 135.0 lb

## 2020-07-30 DIAGNOSIS — F329 Major depressive disorder, single episode, unspecified: Secondary | ICD-10-CM | POA: Diagnosis not present

## 2020-07-30 DIAGNOSIS — Z5181 Encounter for therapeutic drug level monitoring: Secondary | ICD-10-CM | POA: Insufficient documentation

## 2020-07-30 DIAGNOSIS — S32001S Stable burst fracture of unspecified lumbar vertebra, sequela: Secondary | ICD-10-CM | POA: Diagnosis present

## 2020-07-30 DIAGNOSIS — N319 Neuromuscular dysfunction of bladder, unspecified: Secondary | ICD-10-CM | POA: Insufficient documentation

## 2020-07-30 DIAGNOSIS — G894 Chronic pain syndrome: Secondary | ICD-10-CM | POA: Diagnosis not present

## 2020-07-30 DIAGNOSIS — Z79899 Other long term (current) drug therapy: Secondary | ICD-10-CM | POA: Insufficient documentation

## 2020-07-30 DIAGNOSIS — G834 Cauda equina syndrome: Secondary | ICD-10-CM | POA: Insufficient documentation

## 2020-07-30 DIAGNOSIS — Z79891 Long term (current) use of opiate analgesic: Secondary | ICD-10-CM | POA: Insufficient documentation

## 2020-07-30 DIAGNOSIS — M792 Neuralgia and neuritis, unspecified: Secondary | ICD-10-CM | POA: Diagnosis not present

## 2020-07-30 DIAGNOSIS — S24103A Unspecified injury at T7-T10 level of thoracic spinal cord, initial encounter: Secondary | ICD-10-CM | POA: Insufficient documentation

## 2020-07-30 DIAGNOSIS — S343XXD Injury of cauda equina, subsequent encounter: Secondary | ICD-10-CM | POA: Diagnosis not present

## 2020-07-30 DIAGNOSIS — T07XXXA Unspecified multiple injuries, initial encounter: Secondary | ICD-10-CM | POA: Insufficient documentation

## 2020-07-30 DIAGNOSIS — S343XXS Injury of cauda equina, sequela: Secondary | ICD-10-CM | POA: Insufficient documentation

## 2020-07-30 MED ORDER — SERTRALINE HCL 100 MG PO TABS
100.0000 mg | ORAL_TABLET | Freq: Every day | ORAL | 5 refills | Status: DC
Start: 2020-07-30 — End: 2020-09-10

## 2020-07-30 MED ORDER — NALOXONE HCL 4 MG/0.1ML NA LIQD: NASAL | 11 refills | Status: AC

## 2020-07-30 MED ORDER — TAPENTADOL HCL 75 MG PO TABS: 75.0000 mg | ORAL_TABLET | Freq: Four times a day (QID) | ORAL | 0 refills | Status: DC | PRN

## 2020-07-30 NOTE — Progress Notes (Signed)
Subjective:    Patient ID: Louis Jordan, male    DOB: 1956-09-05, 64 y.o.   MRN: [id number]  HPI  Due to national recommendations of social distancing because of COVID 8, an audio/video tele-health visit is felt to be the most appropriate encounter for this patient at this time. See MyChart message from today for the patient's consent to a tele-health encounter with [facility]. This is a follow up tele-visit via Webex. The patient is at home. MD is at office.   Pt is a 64 yr old male with Cauda equina syndrome and neurogenic bladder, and bowel, and severe nerve painwhich is impairing functionfor f/u. Has suprapubic catheter as well ans erectile dysfunction.    Has been on methadone.  Hasn't changed the muscle relaxer so far- still working.    Pain about the same with methadone 7-8/10 daily.  Takes 60-90 minutes to take effect-  Takes around 9am in AM- between 8-9pm at night.   The oxycodone- went through 4x/day of hurts SO bad. Now hurts really bad 1x/day- in evening- less highs and lows of methadone.   Insurance will only pay for 7 days at a time- so they pay the $15 for a month's supply at a time.    Did apply for medicare- got applied.  Hasn't tried Nucynta. Rx the one  I sent in expired.   Can get it now for $47 The nucynta.    Has a normal BM basically every day- uses suppository as needed- 1-2x/week.   Has an accident with suppository >50% of time Gets suprapubic catheter every 4 weeks- next Thursday  If strains hard, with bladder full, can void 50 cc or so.  So, basically not voiding much.   Went to back Careers adviser- he thought to try the electrical stimulator.  Has a preliminary appointment for stimulator- will try heading down that road for now.    A little more "emotional" frequently around 6pm- started around 6pm.  Not just pain related.       Pain Inventory Average Pain 7 Pain Right Now 7 My pain is constant,  dull and tingling  LOCATION OF PAIN  Waist down and in back  BOWEL Number of stools per week: 7-10 Oral laxative use Yes  Type of laxative senekot colace and miralax Enema or suppository use Yes  1x wk History of colostomy No  Incontinent No   BLADDER Suprapubic In and out cath, frequency na Able to self cath na Bladder incontinence No  Frequent urination No  Leakage with coughing No  Difficulty starting stream No  Incomplete bladder emptying No    Mobility use a cane how many minutes can you walk? 10-15 ability to climb steps?  no do you drive?  no  Function disabled: date disabled . I need assistance with the following:  bathing, meal prep, household duties and shopping  Neuro/Psych weakness numbness tingling trouble walking spasms confusion depression anxiety  Prior Studies Any changes since last visit?  no  Physicians involved in your care Any changes since last visit?  no   Family History  Problem Relation Age of Onset  . Heart disease Mother   . Alzheimer's disease Mother   . Pancreatic cancer Father   . High blood pressure Brother    Social History   Socioeconomic History  . Marital status: Married    Spouse name: Not on file  . Number of children: Not on file  . Years of education:  Not on file  . Highest education level: Not on file  Occupational History  . Not on file  Tobacco Use  . Smoking status: Never Smoker  . Smokeless tobacco: Never Used  Vaping Use  . Vaping Use: Never used  Substance and Sexual Activity  . Alcohol use: Not Currently  . Drug use: Never  . Sexual activity: Not on file  Other Topics Concern  . Not on file  Social History Narrative  . Not on file   Social Determinants of Health   Financial Resource Strain:   . Difficulty of Paying Living Expenses:   Food Insecurity:   . Worried About Programme researcher, broadcasting/film/video in the Last Year:   . Barista in the Last Year:   Transportation Needs:   . Automotive engineer (Medical):   Marland Kitchen Lack of Transportation (Non-Medical):   Physical Activity:   . Days of Exercise per Week:   . Minutes of Exercise per Session:   Stress:   . Feeling of Stress :   Social Connections:   . Frequency of Communication with Friends and Family:   . Frequency of Social Gatherings with Friends and Family:   . Attends Religious Services:   . Active Member of Clubs or Organizations:   . Attends Banker Meetings:   Marland Kitchen Marital Status:    Past Surgical History:  Procedure Laterality Date  . APPENDECTOMY  1964  . BOTOX INJECTION N/A 02/28/2020   Procedure: CYSTOSCOPY BOTOX 100 UNITS INJECTION;  Surgeon: Alfredo Martinez, MD;  Location: [facility];  Service: Urology;  Laterality: N/A;  . CERVICAL FUSION  2000   c 5 to c 7   . EYE SURGERY Bilateral 1962   eye muscle sx  . INSERTION OF SUPRAPUBIC CATHETER N/A 02/28/2020   Procedure: INSERTION OF SUPRAPUBIC CATHETER;  Surgeon: Alfredo Martinez, MD;  Location: [facility];  Service: Urology;  Laterality: N/A;  . KNEE SURGERY Right   . LUMBAR SPINE SURGERY  10/16/2019   L1 burst fracture with epidural hematoma and canal stenosis  . OPEN REDUCTION INTERNAL FIXATION (ORIF) TIBIA/FIBULA FRACTURE Left 10/19/2019   Procedure: OPEN REDUCTION INTERNAL FIXATION (ORIF) TIBIA/FIBULA FRACTURE;  Surgeon: Roby Lofts, MD;  Location: [facility];  Service: Orthopedics;  Laterality: Left;  . SHOULDER ARTHROSCOPY WITH BANKART REPAIR Left 10/19/2019   Procedure: SHOULDER ARTHROSCOPY WITH BANKART REPAIR, EXTENSIVE DEBRIDEMENT, LOOSE BODY REMOVAL;  Surgeon: Bjorn Pippin, MD;  Location: [facility];  Service: Orthopedics;  Laterality: Left;  . STRABISMUS SURGERY    . TONSILLECTOMY     Past Medical History:  Diagnosis Date  . Anemia   . Arthritis   . Bundle branch block, right   . Closed left ankle fracture   . Complication of anesthesia   . Compression fracture of lumbar vertebrae, non-traumatic  (HCC)   . Foley catheter in place    last changed 2 weeks ago  . GERD (gastroesophageal reflux disease)   . Hand pain    mild OA bilateral  hands  . Left knee injury 1976   requiring surgery for repair of ligaments/cartilage damage with nerve injury that took 20 years to resolve  . MVP (mitral valve prolapse)    mild no cardiologist  . Neurogenic bladder   . Neurogenic bowel   . PONV (postoperative nausea and vomiting)   . Spinal cord injury at T7-T12 level (HCC) 10/15/2019   due to accident 10-15-2019 fell 18 feet on  concrete   Ht 6' (1.829 m)   Wt 135 lb (61.2 kg) Comment: reported  BMI 18.31 kg/m   Opioid Risk Score:   Fall Risk Score:  `1  Depression screen PHQ 2/9  Depression screen [facility] 2/9 07/30/2020 11/14/2019  Decreased Interest 1 0  Down, Depressed, Hopeless 1 2  PHQ - 2 Score 2 2  Altered sleeping - 1  Tired, decreased energy - 1  Change in appetite - 0  Feeling bad or failure about yourself  - 3  Trouble concentrating - 1  Moving slowly or fidgety/restless - 3  Suicidal thoughts - 0  PHQ-9 Score - 11  Difficult doing work/chores - Somewhat difficult   Review of Systems  Constitutional: Negative.   HENT: Negative.   Eyes: Negative.   Respiratory: Negative.   Cardiovascular: Negative.   Gastrointestinal:       Bowel program  Endocrine: Negative.   Genitourinary:       Suprapubic cath changed q 4 weeks  Musculoskeletal: Positive for back pain, gait problem and myalgias.  Skin: Negative.   Allergic/Immunologic: Negative.   Neurological: Positive for weakness and numbness.       Tingling  Hematological: Negative.   Psychiatric/Behavioral: Positive for confusion and dysphoric mood. The patient is nervous/anxious.        He feels confusion is medication related  All other systems reviewed and are negative.      Objective:   Physical Exam  webex      Assessment & Plan:   Pt is a 64 yr old male with Cauda equina syndrome and neurogenic bladder,  and bowel, and severe nerve painwhich is impairing functionfor f/u.   1. Nucynta 75 mg q6 hours as needed- 7 days supply   2. Also give Rx for Narcan- in case gets too much pain meds. Narcan nasal spray sent  3. Wait 1 week and  Robaxin/Methocarbemol, can switch to baclofen. Baclofen is SCHEDULED Picked it up, not taken yet.    4. Can try CBD oil- <3% THC- - 60-70% can get good results. No medical marijuana at this time is allowed.    5. If wants to find out if testosterone is low- have PCP check the level.    6. Discussed changing Lexapro- max dose is 20 mg, so will switch to Citalopram since allowed to go to 40 mg on Citalopram. Can't use Citalopram with methadone- so will do Zoloft/Sertraline 100 mg daily- wait 1-2 days after Nucynta before changing- replace Lexapro with Zoloft.    7. F/U 6 weeks- earlier if need be.   Only got 52 of methadone- not 60 mg- has 44 pills left.  So will need Methadone Rx 4  days earlier than normal.    I spent a total of 40 minutes on visit- as detailed above.

## 2020-07-30 NOTE — Patient Instructions (Signed)
  Pt is a 64 yr old male with Cauda equina syndrome and neurogenic bladder, and bowel, and severe nerve painwhich is impairing functionfor f/u.   1. Nucynta 75 mg q6 hours as needed- 7 days supply   2. Also give Rx for Narcan- in case gets too much pain meds.   3. Wait 1 week and  Robaxin/Methocarbemol, can switch to baclofen. Baclofen is SCHEDULED Picked it up, not taken yet.    4. Can try CBD oil- <3% THC- - 60-70% can get good results. No medical marijuana at this time is allowed.    5. If wants to find out if testosterone is low- have PCP check the level.    6. Discussed changing Lexapro- max dose is 20 mg, so will switch to Citalopram since allowed to go to 40 mg on Citalopram. Can't use Citalopram with methadone- so will do Zoloft/Sertraline 100 mg daily- wait 1-2 days after Nucynta before changing- replace Lexapro with Zoloft.    7. F/U 6 weeks- earlier if need be.   Only got 52 of methadone- not 60 mg- has 44 pills left.  So will need Methadone Rx 4  days earlier than normal.

## 2020-08-13 ENCOUNTER — Telehealth: Payer: Self-pay

## 2020-08-13 MED ORDER — METHADONE HCL 5 MG PO TABS: 5.0000 mg | ORAL_TABLET | Freq: Two times a day (BID) | ORAL | 0 refills | Status: DC

## 2020-08-13 NOTE — Telephone Encounter (Signed)
Refill Methadone 5 mg BID #60- no RFs Sent to pharmacy

## 2020-08-13 NOTE — Telephone Encounter (Signed)
Requesting refill on [facility]

## 2020-08-15 ENCOUNTER — Other Ambulatory Visit: Payer: Self-pay | Admitting: Physical Medicine and Rehabilitation

## 2020-08-15 NOTE — Telephone Encounter (Signed)
Stop Nucynta due to no improvement in pain at all. Going to try a nerve stimulator for pain- in the next few weeks-   Also, decided to continue Baclofen at current dose 5 mg TID at this time.   Refilled methadone on Monday- will refill Gabapentin.   Spoke to pt.

## 2020-08-17 ENCOUNTER — Telehealth: Payer: Self-pay | Admitting: Physical Medicine and Rehabilitation

## 2020-08-17 NOTE — Telephone Encounter (Signed)
Got call from pt that was OUT of methadone-  Called pharmacy- not due to refill pain meds until Tuesday 9/7- however can pick up 9/5- Sunday. Pt had been given 52 pills last time- so thought that was why things were off.  Pt and wife were insistent he had not taken too many and that he was out- couldn't figure out why everything off counts- - after 15 minutes of going around and around wife noticed the medicine organizer had 8 pills in it- but none in bottle- so found the 8 pills they were missing.   Just fyi for chart.

## 2020-08-21 MED ORDER — TAPENTADOL HCL 75 MG PO TABS: 75.0000 mg | ORAL_TABLET | Freq: Four times a day (QID) | ORAL | 0 refills | Status: DC | PRN

## 2020-09-10 ENCOUNTER — Other Ambulatory Visit: Payer: Self-pay

## 2020-09-10 ENCOUNTER — Encounter: Payer: Self-pay | Admitting: Physical Medicine and Rehabilitation

## 2020-09-10 ENCOUNTER — Encounter
Payer: Commercial Managed Care - PPO | Attending: Physical Medicine and Rehabilitation | Admitting: Physical Medicine and Rehabilitation

## 2020-09-10 VITALS — BP 132/72 | HR 56 | Temp 98.0°F | Ht 72.0 in | Wt 136.8 lb

## 2020-09-10 DIAGNOSIS — S32001S Stable burst fracture of unspecified lumbar vertebra, sequela: Secondary | ICD-10-CM | POA: Insufficient documentation

## 2020-09-10 DIAGNOSIS — M792 Neuralgia and neuritis, unspecified: Secondary | ICD-10-CM | POA: Diagnosis not present

## 2020-09-10 DIAGNOSIS — N319 Neuromuscular dysfunction of bladder, unspecified: Secondary | ICD-10-CM | POA: Insufficient documentation

## 2020-09-10 DIAGNOSIS — G894 Chronic pain syndrome: Secondary | ICD-10-CM | POA: Diagnosis not present

## 2020-09-10 DIAGNOSIS — Z5181 Encounter for therapeutic drug level monitoring: Secondary | ICD-10-CM | POA: Insufficient documentation

## 2020-09-10 DIAGNOSIS — T07XXXA Unspecified multiple injuries, initial encounter: Secondary | ICD-10-CM | POA: Diagnosis present

## 2020-09-10 DIAGNOSIS — S24103A Unspecified injury at T7-T10 level of thoracic spinal cord, initial encounter: Secondary | ICD-10-CM | POA: Insufficient documentation

## 2020-09-10 DIAGNOSIS — Z79891 Long term (current) use of opiate analgesic: Secondary | ICD-10-CM | POA: Diagnosis present

## 2020-09-10 DIAGNOSIS — Z79899 Other long term (current) drug therapy: Secondary | ICD-10-CM | POA: Insufficient documentation

## 2020-09-10 DIAGNOSIS — G834 Cauda equina syndrome: Secondary | ICD-10-CM | POA: Diagnosis present

## 2020-09-10 DIAGNOSIS — S343XXS Injury of cauda equina, sequela: Secondary | ICD-10-CM | POA: Diagnosis not present

## 2020-09-10 DIAGNOSIS — F329 Major depressive disorder, single episode, unspecified: Secondary | ICD-10-CM

## 2020-09-10 MED ORDER — TAPENTADOL HCL 100 MG PO TABS: 100.0000 mg | ORAL_TABLET | Freq: Two times a day (BID) | ORAL | 0 refills | Status: DC | PRN

## 2020-09-10 MED ORDER — DICLOFENAC SODIUM 75 MG PO TBEC: 75.0000 mg | DELAYED_RELEASE_TABLET | Freq: Two times a day (BID) | ORAL | 5 refills | Status: DC

## 2020-09-10 MED ORDER — SERTRALINE HCL 100 MG PO TABS
150.0000 mg | ORAL_TABLET | Freq: Every day | ORAL | 5 refills | Status: DC
Start: 2020-09-10 — End: 2022-06-20

## 2020-09-10 MED ORDER — BACLOFEN 5 MG PO TABS
5.0000 mg | ORAL_TABLET | Freq: Three times a day (TID) | ORAL | 5 refills | Status: DC
Start: 2020-09-10 — End: 2021-03-18

## 2020-09-10 NOTE — Patient Instructions (Signed)
Pt is a 64 yr old male with Cauda equina syndrome and neurogenic bladder, and bowel, and severe nerve painwhich is impairing functionfor f/u.    1. Try Diclofenac 75 mg 2x/day- for aching pain. Is an antiinflammatory- take with food, if possible.   2. If stimulator doesn't work- can try Pain pump. Dr Yetta Barre should be able to point [place] in the right direction.    3. Increase Zoloft to 150 mg daily. For mood (no side effects)  4. Change Baclofen to 5 mg tab- 3x/day- for spasms-   5. If no improvement in aching pain in the next 7-10 days, will call Dr Berline Chough- and don't wait til next appointment.   6. Nucynta 100 mg 2x/day as needed for nerve pain.   7. Don't make all changes as much.   8. F/U 1 month

## 2020-09-10 NOTE — Progress Notes (Signed)
Subjective:    Patient ID: Louis Jordan, male    DOB: March 18, 1956, 64 y.o.   MRN: [id number]  HPI  Pt is a 64 yr old male with Cauda equina syndrome and neurogenic bladder, and bowel, and severe nerve painwhich is impairing functionfor f/u.    Waiting on Psych eval for spinal cord stimulator. Dr Lorrine Kin- he's going to do Spinal cord stimulator, and Dr Yetta Barre to place stimulator.   Doesn't know when scheduled.   Concerned there's an issue- explained it's for everyone.    Taking Nucynta- insurance could only get 28 pills.   Tramadol doesn't help at all- so hasn't taken any since seen last.  Also not taking Oxycodone- hasn't taken since Methadone.   CBD oil- made him nauseated and sick- 3x- urine turned dark orange color.  And made him feel very nauseated every time.    Also still taking Methadone- 5 mg BID.  Pain is still 7-8/10 no matter the meds.  Pain getting worse on a weekly basis.  Hurting more than was when left hospital after all the fractures.   Now more nerve pain- burning/ and dull aching pain. Not taking Meloxicam anymore.  A Hard ACHE is the main pain.   Changed to Zoloft from Lexapro.  1 week ago, started feeling down, depressed- has to make himself get OOB.  So ready to give up.  Pain making him feel the way he does.    Having to take Phenergan 2x/day due to nausea from pain. Thinks phenergan is working at current dose.   Is all new from the last visit.        Pain Inventory Average Pain 7 Pain Right Now 8 My pain is constant, dull, tingling and aching  In the last 24 hours, has pain interfered with the following? General activity 7 Relation with others 7 Enjoyment of life 8 What TIME of day is your pain at its worst? morning  Sleep (in general) Fair  Pain is worse with: sitting and some activites Pain improves with: medication Relief from Meds: 4  Family History  Problem Relation Age of Onset  . Heart disease Mother   .  Alzheimer's disease Mother   . Pancreatic cancer Father   . High blood pressure Brother    Social History   Socioeconomic History  . Marital status: Married    Spouse name: Not on file  . Number of children: Not on file  . Years of education: Not on file  . Highest education level: Not on file  Occupational History  . Not on file  Tobacco Use  . Smoking status: Never Smoker  . Smokeless tobacco: Never Used  Vaping Use  . Vaping Use: Never used  Substance and Sexual Activity  . Alcohol use: Not Currently  . Drug use: Never  . Sexual activity: Not on file  Other Topics Concern  . Not on file  Social History Narrative  . Not on file   Social Determinants of Health   Financial Resource Strain:   . Difficulty of Paying Living Expenses: Not on file  Food Insecurity:   . Worried About Programme researcher, broadcasting/film/video in the Last Year: Not on file  . Ran Out of Food in the Last Year: Not on file  Transportation Needs:   . Lack of Transportation (Medical): Not on file  . Lack of Transportation (Non-Medical): Not on file  Physical Activity:   . Days of Exercise per Week: Not on file  .  Minutes of Exercise per Session: Not on file  Stress:   . Feeling of Stress : Not on file  Social Connections:   . Frequency of Communication with Friends and Family: Not on file  . Frequency of Social Gatherings with Friends and Family: Not on file  . Attends Religious Services: Not on file  . Active Member of Clubs or Organizations: Not on file  . Attends Banker Meetings: Not on file  . Marital Status: Not on file   Past Surgical History:  Procedure Laterality Date  . APPENDECTOMY  1964  . BOTOX INJECTION N/A 02/28/2020   Procedure: CYSTOSCOPY BOTOX 100 UNITS INJECTION;  Surgeon: Alfredo Martinez, MD;  Location: [facility];  Service: Urology;  Laterality: N/A;  . CERVICAL FUSION  2000   c 5 to c 7   . EYE SURGERY Bilateral 1962   eye muscle sx  . INSERTION OF  SUPRAPUBIC CATHETER N/A 02/28/2020   Procedure: INSERTION OF SUPRAPUBIC CATHETER;  Surgeon: Alfredo Martinez, MD;  Location[facility];  Service: Urology;  Laterality: N/A;  . KNEE SURGERY Right   . LUMBAR SPINE SURGERY  10/16/2019   L1 burst fracture with epidural hematoma and canal stenosis  . OPEN REDUCTION INTERNAL FIXATION (ORIF) TIBIA/FIBULA FRACTURE Left 10/19/2019   Procedure: OPEN REDUCTION INTERNAL FIXATION (ORIF) TIBIA/FIBULA FRACTURE;  Surgeon: Roby Lofts, MD;  Location: [facility];  Service: Orthopedics;  Laterality: Left;  . SHOULDER ARTHROSCOPY WITH BANKART REPAIR Left 10/19/2019   Procedure: SHOULDER ARTHROSCOPY WITH BANKART REPAIR, EXTENSIVE DEBRIDEMENT, LOOSE BODY REMOVAL;  Surgeon: Bjorn Pippin, MD;  Location: [facility];  Service: Orthopedics;  Laterality: Left;  . STRABISMUS SURGERY    . TONSILLECTOMY     Past Surgical History:  Procedure Laterality Date  . APPENDECTOMY  1964  . BOTOX INJECTION N/A 02/28/2020   Procedure: CYSTOSCOPY BOTOX 100 UNITS INJECTION;  Surgeon: Alfredo Martinez, MD;  Location: [facility];  Service: Urology;  Laterality: N/A;  . CERVICAL FUSION  2000   c 5 to c 7   . EYE SURGERY Bilateral 1962   eye muscle sx  . INSERTION OF SUPRAPUBIC CATHETER N/A 02/28/2020   Procedure: INSERTION OF SUPRAPUBIC CATHETER;  Surgeon: Alfredo Martinez, MD;  Location: [facility];  Service: Urology;  Laterality: N/A;  . KNEE SURGERY Right   . LUMBAR SPINE SURGERY  10/16/2019   L1 burst fracture with epidural hematoma and canal stenosis  . OPEN REDUCTION INTERNAL FIXATION (ORIF) TIBIA/FIBULA FRACTURE Left 10/19/2019   Procedure: OPEN REDUCTION INTERNAL FIXATION (ORIF) TIBIA/FIBULA FRACTURE;  Surgeon: Roby Lofts, MD;  Location: [facility];  Service: Orthopedics;  Laterality: Left;  . SHOULDER ARTHROSCOPY WITH BANKART REPAIR Left 10/19/2019   Procedure: SHOULDER ARTHROSCOPY WITH BANKART REPAIR, EXTENSIVE DEBRIDEMENT, LOOSE BODY  REMOVAL;  Surgeon: Bjorn Pippin, MD;  Location: [facility];  Service: Orthopedics;  Laterality: Left;  . STRABISMUS SURGERY    . TONSILLECTOMY     Past Medical History:  Diagnosis Date  . Anemia   . Arthritis   . Bundle branch block, right   . Closed left ankle fracture   . Complication of anesthesia   . Compression fracture of lumbar vertebrae, non-traumatic (HCC)   . Foley catheter in place    last changed 2 weeks ago  . GERD (gastroesophageal reflux disease)   . Hand pain    mild OA bilateral  hands  . Left knee injury 1976   requiring surgery  for repair of ligaments/cartilage damage with nerve injury that took 20 years to resolve  . MVP (mitral valve prolapse)    mild no cardiologist  . Neurogenic bladder   . Neurogenic bowel   . PONV (postoperative nausea and vomiting)   . Spinal cord injury at T7-T12 level (HCC) 10/15/2019   due to accident 10-15-2019 fell 18 feet on concrete   BP 132/72   Pulse (!) 56   Temp 98 F (36.7 C)   Ht 6' (1.829 m)   Wt 136 lb 12.8 oz (62.1 kg)   SpO2 97%   BMI 18.55 kg/m   Opioid Risk Score:   Fall Risk Score:  `1  Depression screen PHQ 2/9  Depression screen [facility] 2/9 07/30/2020 11/14/2019  Decreased Interest 1 0  Down, Depressed, Hopeless 1 2  PHQ - 2 Score 2 2  Altered sleeping - 1  Tired, decreased energy - 1  Change in appetite - 0  Feeling bad or failure about yourself  - 3  Trouble concentrating - 1  Moving slowly or fidgety/restless - 3  Suicidal thoughts - 0  PHQ-9 Score - 11  Difficult doing work/chores - Somewhat difficult   Review of Systems  Constitutional: Negative.   HENT: Negative.   Eyes: Negative.   Respiratory: Negative.   Cardiovascular: Negative.   Gastrointestinal: Negative.   Endocrine: Negative.   Genitourinary: Negative.        Groin pain  Musculoskeletal: Positive for back pain and gait problem. Negative for arthralgias.       Back, legs, buttock   Skin: Negative.   Psychiatric/Behavioral:  Negative.        Depression  All other systems reviewed and are negative.      Objective:   Physical Exam  Awake, alert, using cane to get around, accompanied by wife, holding hands together so tightly, hands turning white, due to so much pressure- trying to deal with pain.        Assessment & Plan:  Pt is a 64 yr old male with Cauda equina syndrome and neurogenic bladder, and bowel, and severe nerve painwhich is impairing functionfor f/u.    1. Try Diclofenac 75 mg 2x/day- for aching pain. Is an antiinflammatory- take with food, if possible.   2. If stimulator doesn't work- can try Pain pump. Dr Yetta Barre should be able to point [place] in the right direction.    3. Increase Zoloft to 150 mg daily. For mood (no side effects)  4. Change Baclofen to 5 mg tab- 3x/day- for spasms-   5. If no improvement in aching pain in the next 7-10 days, will call Dr Berline Chough- and don't wait til next appointment.   6. Nucynta 100 mg 2x/day as needed for nerve pain.   7. Don't make all changes as much.   8. F/U 1 month   I spent a total 45 minutes on visit- as detailed above.

## 2020-09-19 MED ORDER — METHADONE HCL 5 MG PO TABS
5.0000 mg | ORAL_TABLET | Freq: Two times a day (BID) | ORAL | 0 refills | Status: DC
Start: 2020-09-19 — End: 2020-10-15

## 2020-09-19 NOTE — Telephone Encounter (Signed)
Sent in Methadone Rx 5 mg BID #60

## 2020-10-07 ENCOUNTER — Other Ambulatory Visit: Payer: Self-pay | Admitting: Physical Medicine and Rehabilitation

## 2020-10-15 ENCOUNTER — Encounter: Payer: Self-pay | Admitting: Physical Medicine and Rehabilitation

## 2020-10-15 ENCOUNTER — Encounter
Payer: Commercial Managed Care - PPO | Attending: Physical Medicine and Rehabilitation | Admitting: Physical Medicine and Rehabilitation

## 2020-10-15 ENCOUNTER — Other Ambulatory Visit: Payer: Self-pay

## 2020-10-15 VITALS — BP 136/78 | HR 57 | Temp 98.5°F | Ht 72.0 in | Wt 137.8 lb

## 2020-10-15 DIAGNOSIS — F4321 Adjustment disorder with depressed mood: Secondary | ICD-10-CM | POA: Diagnosis present

## 2020-10-15 DIAGNOSIS — Z79891 Long term (current) use of opiate analgesic: Secondary | ICD-10-CM | POA: Diagnosis present

## 2020-10-15 DIAGNOSIS — F329 Major depressive disorder, single episode, unspecified: Secondary | ICD-10-CM | POA: Insufficient documentation

## 2020-10-15 DIAGNOSIS — G894 Chronic pain syndrome: Secondary | ICD-10-CM | POA: Insufficient documentation

## 2020-10-15 DIAGNOSIS — S343XXS Injury of cauda equina, sequela: Secondary | ICD-10-CM | POA: Insufficient documentation

## 2020-10-15 DIAGNOSIS — Z5181 Encounter for therapeutic drug level monitoring: Secondary | ICD-10-CM | POA: Diagnosis present

## 2020-10-15 DIAGNOSIS — R112 Nausea with vomiting, unspecified: Secondary | ICD-10-CM | POA: Insufficient documentation

## 2020-10-15 MED ORDER — TAPENTADOL HCL 100 MG PO TABS
100.0000 mg | ORAL_TABLET | Freq: Two times a day (BID) | ORAL | 0 refills | Status: DC | PRN
Start: 2020-10-15 — End: 2020-11-16

## 2020-10-15 MED ORDER — METHADONE HCL 5 MG PO TABS
5.0000 mg | ORAL_TABLET | Freq: Two times a day (BID) | ORAL | 0 refills | Status: DC
Start: 2020-10-15 — End: 2020-11-16

## 2020-10-15 NOTE — Progress Notes (Signed)
Subjective:    Patient ID: Louis Jordan, male    DOB: [birth year], 64 y.o.   MRN: [id number]  HPI  Pt is a 64 yr old male with Cauda equina syndrome and neurogenic bladder, and bowel, and severe nerve painwhich is impairing functionfor f/u.    On average, is 6/10- Has a stretch of time in the AM, after the 1 hour meds takes to work, til 2pm.  Best part of day-  Gets nausea every day at 3-3:30 pm- takes 1 hour to work.  Been occurring for 3+ weeks, getting nauseated.   Started on increased Nucynta Diclofenac- seems to help more than before.   First time, I've heard the word "Bearable". Pain is "bearable with meds".   Takes meds 11pm at night.  Not in severe pain in AM- was wondering why.  Explained likely due to not walking around.   Diclofenac helps even more than anything.   But both helped.   AM  methadone 5mg  Diclofenac 75 mg Baclofen 5 mg Gabapentin 600 mg Nucynta 100 mg    Zoloft at lunch  Just finished psych evaluation for Nerve stimulator L/M to see if can schedule nerve stimulator test now- will let me know when scheduled.   3:30 to 4pm- every day- feels awful with nausea and depression and pain   . Pain Inventory Average Pain 6 Pain Right Now 6 My pain is dull, tingling and aching  LOCATION OF PAIN  Back, buttocks, groin, leg, ankle  BOWEL Number of stools per week: 7-10 Oral laxative use No  Type of laxative na Enema or suppository use No  History of colostomy No  Incontinent No   BLADDER Suprapubic In and out cath, frequency no Able to self cath Yes  Bladder incontinence No  Frequent urination No  Leakage with coughing No  Difficulty starting stream No  Incomplete bladder emptying No    Mobility walk with assistance use a cane ability to climb steps?  yes do you drive?  no  Function employed # of hrs/week . disabled: date disabled . I need assistance with the following:  meal prep and household  duties  Neuro/Psych tingling depression  Prior Studies Any changes since last visit?  no  Physicians involved in your care Any changes since last visit?  no   Family History  Problem Relation Age of Onset  . Heart disease Mother   . Alzheimer's disease Mother   . Pancreatic cancer Father   . High blood pressure Brother    Social History   Socioeconomic History  . Marital status: Married    Spouse name: Not on file  . Number of children: Not on file  . Years of education: Not on file  . Highest education level: Not on file  Occupational History  . Not on file  Tobacco Use  . Smoking status: Never Smoker  . Smokeless tobacco: Never Used  Vaping Use  . Vaping Use: Never used  Substance and Sexual Activity  . Alcohol use: Not Currently  . Drug use: Never  . Sexual activity: Not on file  Other Topics Concern  . Not on file  Social History Narrative  . Not on file   Social Determinants of Health   Financial Resource Strain:   . Difficulty of Paying Living Expenses: Not on file  Food Insecurity:   . Worried About in the Last Year: Not on file  . Ran Out of Food in the Last  Year: Not on file  Transportation Needs:   . Lack of Transportation (Medical): Not on file  . Lack of Transportation (Non-Medical): Not on file  Physical Activity:   . Days of Exercise per Week: Not on file  . Minutes of Exercise per Session: Not on file  Stress:   . Feeling of Stress : Not on file  Social Connections:   . Frequency of Communication with Friends and Family: Not on file  . Frequency of Social Gatherings with Friends and Family: Not on file  . Attends Religious Services: Not on file  . Active Member of Clubs or Organizations: Not on file  . Attends Banker Meetings: Not on file  . Marital Status: Not on file   Past Surgical History:  Procedure Laterality Date  . APPENDECTOMY  1964  . BOTOX INJECTION N/A 02/28/2020   Procedure: CYSTOSCOPY  BOTOX 100 UNITS INJECTION;  Surgeon: Alfredo Martinez, MD;  Location: [facility];  Service: Urology;  Laterality: N/A;  . CERVICAL FUSION  2000   c 5 to c 7   . EYE SURGERY Bilateral 1962   eye muscle sx  . INSERTION OF SUPRAPUBIC CATHETER N/A 02/28/2020   Procedure: INSERTION OF SUPRAPUBIC CATHETER;  Surgeon: Alfredo Martinez, MD;  Location: [facility] [place];  Service: Urology;  Laterality: N/A;  . KNEE SURGERY Right   . LUMBAR SPINE SURGERY  10/16/2019   L1 burst fracture with epidural hematoma and canal stenosis  . OPEN REDUCTION INTERNAL FIXATION (ORIF) TIBIA/FIBULA FRACTURE Left 10/19/2019   Procedure: OPEN REDUCTION INTERNAL FIXATION (ORIF) TIBIA/FIBULA FRACTURE;  Surgeon: Roby Lofts, MD;  Location: [facility];  Service: Orthopedics;  Laterality: Left;  . SHOULDER ARTHROSCOPY WITH BANKART REPAIR Left 10/19/2019   Procedure: SHOULDER ARTHROSCOPY WITH BANKART REPAIR, EXTENSIVE DEBRIDEMENT, LOOSE BODY REMOVAL;  Surgeon: Bjorn Pippin, MD;  Location: [facility];  Service: Orthopedics;  Laterality: Left;  . STRABISMUS SURGERY    . TONSILLECTOMY     Past Medical History:  Diagnosis Date  . Anemia   . Arthritis   . Bundle branch block, right   . Closed left ankle fracture   . Complication of anesthesia   . Compression fracture of lumbar vertebrae, non-traumatic (HCC)   . Foley catheter in place    last changed 2 weeks ago  . GERD (gastroesophageal reflux disease)   . Hand pain    mild OA bilateral  hands  . Left knee injury 1976   requiring surgery for repair of ligaments/cartilage damage with nerve injury that took 20 years to resolve  . MVP (mitral valve prolapse)    mild no cardiologist  . Neurogenic bladder   . Neurogenic bowel   . PONV (postoperative nausea and vomiting)   . Spinal cord injury at T7-T12 level (HCC) 10/15/2019   due to accident 10-15-2019 fell 18 feet on concrete   BP 136/78   Pulse (!) 57   Temp 98.5 F (36.9 C)   Ht 6' (1.829 m)    Wt 137 lb 12.8 oz (62.5 kg)   SpO2 96%   BMI 18.69 kg/m   Opioid Risk Score:   Fall Risk Score:  `1  Depression screen PHQ 2/9  Depression screen [facility] 2/9 09/10/2020 07/30/2020 11/14/2019  Decreased Interest 1 1 0  Down, Depressed, Hopeless 1 1 2   PHQ - 2 Score 2 2 2   Altered sleeping - - 1  Tired, decreased energy - - 1  Change in appetite - -  0  Feeling bad or failure about yourself  - - 3  Trouble concentrating - - 1  Moving slowly or fidgety/restless - - 3  Suicidal thoughts - - 0  PHQ-9 Score - - 11  Difficult doing work/chores - - Somewhat difficult   Review of Systems An entire ROS was completed and found to be negative except for HPI.     Objective:   Physical Exam  Awake, alert, appropriate , accompanied by wife, has single point cane, NAD      Assessment & Plan:   Pt is a 64 yr old male with Cauda equina syndrome and neurogenic bladder, and bowel, and severe nerve painwhich is impairing functionfor f/u.  1. Take Promethazine with lunch time meds.   2. Take all meds with food.  To make sure doesn't get nausea,   3. See how nerve stimulator test goes and go from there.   4. Don't change any meds today  5. Refill Nucynta 100 mg BID #60 and Methadone 5 mg BID #60-   6. Doesn't need any refills otherwise.    7.  Can take up to Promethazine 25-37.5 mg for nausea.    8. F/U in 4 weeks.  Double appointment  I spent a total of 25 minutes on appointment- as detailed above .

## 2020-10-15 NOTE — Patient Instructions (Signed)
  1. Take Promethazine with lunch time meds.   2. Take all meds with food.  To make sure doesn't get nausea,   3. See how nerve stimulator test goes and go from there.   4. Don't change any meds today  5. Refill Nucynta 100 mg BID #60 and Methadone 5 mg BID #60-   6. Doesn't need any refills otherwise.    7.  Can take up to Promethazine 25-37.5 mg for nausea. Can always take 1/2 of the 12.5 mg-     8. F/U in 4 weeks.  Double appointment

## 2020-10-17 ENCOUNTER — Telehealth: Payer: Self-pay | Admitting: *Deleted

## 2020-10-17 NOTE — Telephone Encounter (Signed)
PA has been submitted to [employer] via CoverMyMeds and Louis Jordan is aware we are waiting on the response.

## 2020-10-17 NOTE — Telephone Encounter (Signed)
Prior auth submitted to [employer] via CoverMyMeds for Nucynta 100 mg #60.

## 2020-10-19 ENCOUNTER — Other Ambulatory Visit: Payer: Self-pay | Admitting: Physical Medicine and Rehabilitation

## 2020-10-19 LAB — DRUG TOX ALC METAB W/CON, ORAL FLD: Alcohol Metabolite: NEGATIVE ng/mL (ref <25)

## 2020-10-19 LAB — DRUG TOX MONITOR 1 W/CONF, ORAL FLD
Amphetamines: NEGATIVE ng/mL (ref <10)
Barbiturates: NEGATIVE ng/mL (ref <10)
Benzodiazepines: NEGATIVE ng/mL (ref <0.50)
Buprenorphine: NEGATIVE ng/mL (ref <0.10)
Cocaine: NEGATIVE ng/mL (ref <5.0)
EDDP: NEGATIVE ng/mL (ref <5.0)
Fentanyl: NEGATIVE ng/mL (ref <0.10)
Heroin Metabolite: NEGATIVE ng/mL (ref <1.0)
MARIJUANA: NEGATIVE ng/mL (ref <2.5)
MDMA: NEGATIVE ng/mL (ref <10)
Meprobamate: NEGATIVE ng/mL (ref <2.5)
Methadone: 28.5 ng/mL — ABNORMAL HIGH (ref <5.0)
Methadone: POSITIVE ng/mL — AB (ref <5.0)
Nicotine Metabolite: NEGATIVE ng/mL (ref <5.0)
Opiates: NEGATIVE ng/mL (ref <2.5)
Phencyclidine: NEGATIVE ng/mL (ref <10)
Tapentadol: 217.3 ng/mL — ABNORMAL HIGH (ref <5.0)
Tapentadol: POSITIVE ng/mL — AB (ref <5.0)
Tramadol: NEGATIVE ng/mL (ref <5.0)
Zolpidem: NEGATIVE ng/mL (ref <5.0)

## 2020-10-23 ENCOUNTER — Telehealth: Payer: Self-pay

## 2020-10-23 NOTE — Telephone Encounter (Signed)
Patient called asking for Dr. Berline Chough to call him when she gets a chance.

## 2020-10-26 ENCOUNTER — Telehealth: Payer: Self-pay | Admitting: *Deleted

## 2020-10-26 NOTE — Telephone Encounter (Signed)
Oral swab drug screen was consistent for prescribed medications.  ?

## 2020-11-16 ENCOUNTER — Encounter
Payer: Commercial Managed Care - PPO | Attending: Physical Medicine and Rehabilitation | Admitting: Physical Medicine and Rehabilitation

## 2020-11-16 ENCOUNTER — Other Ambulatory Visit: Payer: Self-pay

## 2020-11-16 ENCOUNTER — Encounter: Payer: Self-pay | Admitting: Physical Medicine and Rehabilitation

## 2020-11-16 VITALS — BP 120/73 | HR 58 | Temp 98.4°F | Ht 72.0 in | Wt 141.0 lb

## 2020-11-16 DIAGNOSIS — F4321 Adjustment disorder with depressed mood: Secondary | ICD-10-CM | POA: Diagnosis present

## 2020-11-16 DIAGNOSIS — S343XXS Injury of cauda equina, sequela: Secondary | ICD-10-CM | POA: Insufficient documentation

## 2020-11-16 DIAGNOSIS — G894 Chronic pain syndrome: Secondary | ICD-10-CM | POA: Diagnosis present

## 2020-11-16 DIAGNOSIS — R112 Nausea with vomiting, unspecified: Secondary | ICD-10-CM | POA: Insufficient documentation

## 2020-11-16 MED ORDER — TAPENTADOL HCL 100 MG PO TABS
100.0000 mg | ORAL_TABLET | Freq: Four times a day (QID) | ORAL | 0 refills | Status: DC | PRN
Start: 2020-11-16 — End: 2020-11-19

## 2020-11-16 MED ORDER — BELBUCA 150 MCG BU FILM
150.0000 ug | ORAL_FILM | Freq: Two times a day (BID) | BUCCAL | 0 refills | Status: AC
Start: 2020-11-16 — End: 2020-11-23

## 2020-11-16 MED ORDER — METHADONE HCL 5 MG PO TABS
5.0000 mg | ORAL_TABLET | Freq: Two times a day (BID) | ORAL | 0 refills | Status: DC
Start: 2020-11-16 — End: 2020-12-19

## 2020-11-16 NOTE — Progress Notes (Signed)
Subjective:    Patient ID: Louis Jordan, male    DOB: Mar 07, 1956, 64 y.o.   MRN: [id number]  HPI  Pt is a 64 yr old male with Cauda equina syndrome and neurogenic bladder, and bowel, and severe nerve painwhich is impairing functionfor f/u.  We've tried basically almost all nerve pain meds that are out there to get pt's pain controlled.   Did nerve stimulator test-  Came back 2 days later and repositioned leads down- when pulled down, caused bowel incontinence.   When pulled it off, realized was more help than expected.   Has to get a MRI and see if can do paddles (not leads).    Not getting sick every day anymore, at 4pm- with phenergan at lunch-  Takes 2 tabs/day of 12.5 mg Phenergan.  And nausea much better controlled-      Pain Inventory Average Pain 6 Pain Right Now 6 My pain is dull and tingling  LOCATION OF PAIN  Wrist, toes, back, buttocks, groin   BOWEL Number of stools per week: 10 Oral laxative use Yes  Type of laxative senna, miralax Enema or suppository use No  History of colostomy No  Incontinent No   BLADDER Suprapubic In and out cath, frequency n/a Able to self cath No  Bladder incontinence No  Frequent urination No  Leakage with coughing No  Difficulty starting stream No  Incomplete bladder emptying No    Mobility walk without assistance walk with assistance use a cane ability to climb steps?  yes do you drive?  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  no  Physicians involved in your care Any changes since last visit?  no   Family History  Problem Relation Age of Onset  . Heart disease Mother   . Alzheimer's disease Mother   . Pancreatic cancer Father   . High blood pressure Brother    Social  History   Socioeconomic History  . Marital status: Married    Spouse name: Not on file  . Number of children: Not on file  . Years of education: Not on file  . Highest education level: Not on file  Occupational History  . Not on file  Tobacco Use  . Smoking status: Never Smoker  . Smokeless tobacco: Never Used  Vaping Use  . Vaping Use: Never used  Substance and Sexual Activity  . Alcohol use: Not Currently  . Drug use: Never  . Sexual activity: Not on file  Other Topics Concern  . Not on file  Social History Narrative  . Not on file   Social Determinants of Health   Financial Resource Strain:   . Difficulty of Paying Living Expenses: Not on file  Food Insecurity:   . Worried About Programme researcher, broadcasting/film/video in the Last Year: Not on file  . Ran Out of Food in the Last Year: Not on file  Transportation Needs:   . Lack of Transportation (Medical): Not on file  . Lack of Transportation (Non-Medical): Not on file  Physical Activity:   . Days of Exercise per Week: Not on file  . Minutes of Exercise per Session: Not on file  Stress:   . Feeling of Stress : Not on file  Social Connections:   . Frequency of  Communication with Friends and Family: Not on file  . Frequency of Social Gatherings with Friends and Family: Not on file  . Attends Religious Services: Not on file  . Active Member of Clubs or Organizations: Not on file  . Attends Banker Meetings: Not on file  . Marital Status: Not on file   Past Surgical History:  Procedure Laterality Date  . APPENDECTOMY  1964  . BOTOX INJECTION N/A 02/28/2020   Procedure: CYSTOSCOPY BOTOX 100 UNITS INJECTION;  Surgeon: Alfredo Martinez, MD;  Location: [facility];  Service: Urology;  Laterality: N/A;  . CERVICAL FUSION  2000   c 5 to c 7   . EYE SURGERY Bilateral 1962   eye muscle sx  . INSERTION OF SUPRAPUBIC CATHETER N/A 02/28/2020   Procedure: INSERTION OF SUPRAPUBIC CATHETER;  Surgeon: Alfredo Martinez, MD;  Location: [facility] [place];  Service: Urology;  Laterality: N/A;  . KNEE SURGERY Right   . LUMBAR SPINE SURGERY  10/16/2019   L1 burst fracture with epidural hematoma and canal stenosis  . OPEN REDUCTION INTERNAL FIXATION (ORIF) TIBIA/FIBULA FRACTURE Left 10/19/2019   Procedure: OPEN REDUCTION INTERNAL FIXATION (ORIF) TIBIA/FIBULA FRACTURE;  Surgeon: Roby Lofts, MD;  Location: [facility];  Service: Orthopedics;  Laterality: Left;  . SHOULDER ARTHROSCOPY WITH BANKART REPAIR Left 10/19/2019   Procedure: SHOULDER ARTHROSCOPY WITH BANKART REPAIR, EXTENSIVE DEBRIDEMENT, LOOSE BODY REMOVAL;  Surgeon: Bjorn Pippin, MD;  Location: [facility];  Service: Orthopedics;  Laterality: Left;  . STRABISMUS SURGERY    . TONSILLECTOMY     Past Medical History:  Diagnosis Date  . Anemia   . Arthritis   . Bundle branch block, right   . Closed left ankle fracture   . Complication of anesthesia   . Compression fracture of lumbar vertebrae, non-traumatic (HCC)   . Foley catheter in place    last changed 2 weeks ago  . GERD (gastroesophageal reflux disease)   . Hand pain    mild OA bilateral  hands  . Left knee injury 1976   requiring surgery for repair of ligaments/cartilage damage with nerve injury that took 20 years to resolve  . MVP (mitral valve prolapse)    mild no cardiologist  . Neurogenic bladder   . Neurogenic bowel   . PONV (postoperative nausea and vomiting)   . Spinal cord injury at T7-T12 level (HCC) 10/15/2019   due to accident 10-15-2019 fell 18 feet on concrete   BP 120/73   Pulse (!) 58   Temp 98.4 F (36.9 C)   Ht 6' (1.829 m)   Wt 141 lb (64 kg)   SpO2 95%   BMI 19.12 kg/m   Opioid Risk Score:   Fall Risk Score:  `1  Depression screen PHQ 2/9  Depression screen [facility] 2/9 09/10/2020 07/30/2020 11/14/2019  Decreased Interest 1 1 0  Down, Depressed, Hopeless 1 1 2   PHQ - 2 Score 2 2 2   Altered sleeping - - 1  Tired, decreased energy - - 1  Change in appetite  - - 0  Feeling bad or failure about yourself  - - 3  Trouble concentrating - - 1  Moving slowly or fidgety/restless - - 3  Suicidal thoughts - - 0  PHQ-9 Score - - 11  Difficult doing work/chores - - Somewhat difficult    Review of Systems  Constitutional: Negative.   HENT: Negative.   Eyes: Negative.   Respiratory: Negative.   Cardiovascular: Negative.  Gastrointestinal: Negative.   Endocrine: Negative.   Genitourinary: Positive for difficulty urinating.  Musculoskeletal: Positive for arthralgias and back pain.  Skin: Negative.   Allergic/Immunologic: Negative.   Neurological: Positive for numbness.       Tingling  Hematological: Negative.   Psychiatric/Behavioral: Negative.   All other systems reviewed and are negative.      Objective:   Physical Exam Awake, alert, appropriate, accompanied by wife, NAD More fidgety- so having a lot more pain.  Nerve pain is main issues      Assessment & Plan:     Pt is a 64 yr old male with Cauda equina syndrome and neurogenic bladder, and bowel, and severe nerve painwhich is impairing functionfor f/u.  Have tried literally every medicine on the market for nerve pain- Keppra, Trileptal, Gabapentin, Lyrica, Cymbalta, and is on Nucynta and Methadone.    1. Will wait on trying  Belbuca 150 mg 2x/day- I suggest lunch and bedtime-   2. Will increase Nucynta to 100 mg q6 hours as needed I.e 4x/day.   3. If Nucynta increased dose doesn't work, we can try [employer].     4. Go forward with plan for permanent nerve stimulator.   5. F/U in 6-8 weeks.  Double appointment for SCI pt.  Call me before next appointment- to let me know if need to try for Belbuca  I spent a total of 35 minutes on visit- as detailed above.

## 2020-11-16 NOTE — Patient Instructions (Signed)
  Pt is a 64 yr old male with Cauda equina syndrome and neurogenic bladder, and bowel, and severe nerve painwhich is impairing functionfor f/u.  Have tried literally every medicine on the market for nerve pain- Keppra, Trileptal, Gabapentin, Lyrica, Cymbalta, and is on Nucynta and Methadone.    1. Will wait on trying  Belbuca 150 mg 2x/day- I suggest lunch and bedtime-   2. Will increase Nucynta to 100 mg q6 hours as needed I.e 4x/day.   3. If Nucynta increased dose doesn't work, we can try [employer].     4. Go forward with plan for permanent nerve stimulator.   5. F/U in 6-8 weeks.  Double appointment for SCI pt.  Call me by Christmas if need to try for Belbuca.

## 2020-11-19 ENCOUNTER — Telehealth: Payer: Self-pay | Admitting: *Deleted

## 2020-11-19 MED ORDER — TAPENTADOL HCL 100 MG PO TABS: 100.0000 mg | ORAL_TABLET | Freq: Four times a day (QID) | ORAL | 0 refills | Status: DC | PRN

## 2020-11-19 NOTE — Telephone Encounter (Signed)
Patient called stating [facility] at [address] does not have Nucynta 100 mg in stock.  They have located the medication at another [facility] at [address]  I spoke to Jacalyn Lefevre, ANP and she agreed to help.  I contacted [facility] on [address] and cancelled the script for Nucynta at that location clearing the way for a new script to be sent to [facility] on [address].

## 2020-11-19 NOTE — Telephone Encounter (Signed)
PMP was reviewed: Dr Berline Chough note was reviewed. Nucynta was  e-scribed today.Purvis Sheffield CMA will call Mr. Ringer.

## 2020-11-29 ENCOUNTER — Telehealth: Payer: Self-pay | Admitting: Physical Medicine and Rehabilitation

## 2020-11-29 MED ORDER — BUPRENORPHINE 7.5 MCG/HR TD PTWK: 1.0000 | MEDICATED_PATCH | TRANSDERMAL | 0 refills | Status: DC

## 2020-11-29 NOTE — Telephone Encounter (Signed)
Insurance doesn't like Belbuca film- wants me to use Butrans Patch.   No reason cannot do Butrans patch  Instead of Belbuca film, but if it doesn't work, will try Belbuca film.   Will start with 7,5 mcg/week due to not being opiate naive- has been on methadone and Nucynta for nerve pain- goal is to get BETTER control of pain, and likely to titrate up to 20 mcg patches if needed.

## 2020-12-10 ENCOUNTER — Other Ambulatory Visit: Payer: Self-pay | Admitting: Physical Medicine and Rehabilitation

## 2020-12-19 ENCOUNTER — Other Ambulatory Visit: Payer: Self-pay | Admitting: Physical Medicine and Rehabilitation

## 2020-12-19 MED ORDER — TAPENTADOL HCL 100 MG PO TABS: 100.0000 mg | ORAL_TABLET | Freq: Four times a day (QID) | ORAL | 0 refills | Status: DC | PRN

## 2020-12-19 MED ORDER — METHADONE HCL 5 MG PO TABS: 5.0000 mg | ORAL_TABLET | Freq: Two times a day (BID) | ORAL | 0 refills | Status: DC

## 2021-01-04 ENCOUNTER — Encounter: Payer: Self-pay | Admitting: Physical Medicine and Rehabilitation

## 2021-01-04 ENCOUNTER — Other Ambulatory Visit: Payer: Self-pay

## 2021-01-04 ENCOUNTER — Encounter
Payer: Commercial Managed Care - PPO | Attending: Physical Medicine and Rehabilitation | Admitting: Physical Medicine and Rehabilitation

## 2021-01-04 VITALS — BP 113/76 | HR 61 | Temp 98.6°F | Ht 72.0 in | Wt 138.4 lb

## 2021-01-04 DIAGNOSIS — F329 Major depressive disorder, single episode, unspecified: Secondary | ICD-10-CM | POA: Diagnosis not present

## 2021-01-04 DIAGNOSIS — M792 Neuralgia and neuritis, unspecified: Secondary | ICD-10-CM | POA: Diagnosis not present

## 2021-01-04 DIAGNOSIS — G894 Chronic pain syndrome: Secondary | ICD-10-CM | POA: Diagnosis not present

## 2021-01-04 DIAGNOSIS — S343XXS Injury of cauda equina, sequela: Secondary | ICD-10-CM | POA: Insufficient documentation

## 2021-01-04 MED ORDER — TRAZODONE HCL 50 MG PO TABS
ORAL_TABLET | ORAL | 3 refills | Status: DC
Start: 2021-01-04 — End: 2021-11-25

## 2021-01-04 MED ORDER — TAPENTADOL HCL 100 MG PO TABS: 100.0000 mg | ORAL_TABLET | Freq: Four times a day (QID) | ORAL | 0 refills | Status: DC | PRN

## 2021-01-04 MED ORDER — METHADONE HCL 5 MG PO TABS: 5.0000 mg | ORAL_TABLET | Freq: Two times a day (BID) | ORAL | 0 refills | Status: DC

## 2021-01-04 NOTE — Patient Instructions (Signed)
  °  Pt is a 65 yr old male with Cauda equina syndrome and neurogenic bladder, and bowel, and severe nerve painwhich is impairing functionfor f/u.  1. Will try Butrans patch at pharmacy- 7.5 mcg/hour - on the lower end of pain meds.  - is a partial antagonist  2. Will keep  Methadone dose and nucynta the same. To see if Lavera Guise is working- if it helps, but not enough, will increase butrans, and stop methadone.    3. If DOESN'T work very well, will increase methadone dose instead and will call Dr Lorrine Kin about spinal cord stimulator.    4. Send in refills for Methadone 5 mg BID and Nucynta 100 mg QID. Doesn't need other med refills right now.  Still on gabapentin 600 mg TID , diclofenac 75 mg BID and baclofen 5 mg TID. Refill Trazodone- refilled 3 month supply with 3 refills.   5. Talk to Dr Lorrine Kin about if spinal cord stimulator doesn't work, about pain pump or nerve blocks.   6. F/U in 6-8 weeks due to snow issues-

## 2021-01-04 NOTE — Progress Notes (Signed)
Subjective:    Patient ID: Louis Jordan, male    DOB: 1955/12/29, 65 y.o.   MRN: [id number]  HPI  Pt is a 65 yr old male with Cauda equina syndrome and neurogenic bladder, and bowel, and severe nerve painwhich is impairing functionfor f/u.   Nucynta 100 mg 4x/day as needed Methadone 5 mg BID   Sent through [facility]- $60/month  When started taking Nucynta 4x/day- has helped a little to take 4x/day.  Spina cord stimulator- had the test- went well enough, going to get it put in- Dr Yetta Barre. Can't put the paddles in because all the hardware.  Dr Lorrine Kin can do leads, Dr Yetta Barre would do the paddles, but doesn't want to.   Hasn't seen Dr Lorrine Kin again yet for the leads.   Wants to stay at current doses of meds.  So can tell if spinal cord stimulator is going to  work.      Pain Inventory Average Pain 6 Pain Right Now 6 My pain is burning, dull and tingling  In the last 24 hours, has pain interfered with the following? General activity 7 Relation with others 7 Enjoyment of life 7 What TIME of day is your pain at its worst? morning  Sleep (in general) Fair  Pain is worse with: bending and sitting Pain improves with: medication Relief from Meds: 6  Family History  Problem Relation Age of Onset  . Heart disease Mother   . Alzheimer's disease Mother   . Pancreatic cancer Father   . High blood pressure Brother    Social History   Socioeconomic History  . Marital status: Married    Spouse name: Not on file  . Number of children: Not on file  . Years of education: Not on file  . Highest education level: Not on file  Occupational History  . Not on file  Tobacco Use  . Smoking status: Never Smoker  . Smokeless tobacco: Never Used  Vaping Use  . Vaping Use: Never used  Substance and Sexual Activity  . Alcohol use: Not Currently  . Drug use: Never  . Sexual activity: Not on file  Other Topics Concern  . Not on file  Social History Narrative  . Not on file    Social Determinants of Health   Financial Resource Strain: Not on file  Food Insecurity: Not on file  Transportation Needs: Not on file  Physical Activity: Not on file  Stress: Not on file  Social Connections: Not on file   Past Surgical History:  Procedure Laterality Date  . APPENDECTOMY  1964  . BOTOX INJECTION N/A 02/28/2020   Procedure: CYSTOSCOPY BOTOX 100 UNITS INJECTION;  Surgeon: Alfredo Martinez, MD;  Location: [facility];  Service: Urology;  Laterality: N/A;  . CERVICAL FUSION  2000   c 5 to c 7   . EYE SURGERY Bilateral 1962   eye muscle sx  . INSERTION OF SUPRAPUBIC CATHETER N/A 02/28/2020   Procedure: INSERTION OF SUPRAPUBIC CATHETER;  Surgeon: Alfredo Martinez, MD;  Location: [facility] [place];  Service: Urology;  Laterality: N/A;  . KNEE SURGERY Right   . LUMBAR SPINE SURGERY  10/16/2019   L1 burst fracture with epidural hematoma and canal stenosis  . OPEN REDUCTION INTERNAL FIXATION (ORIF) TIBIA/FIBULA FRACTURE Left 10/19/2019   Procedure: OPEN REDUCTION INTERNAL FIXATION (ORIF) TIBIA/FIBULA FRACTURE;  Surgeon: Roby Lofts, MD;  Location: [facility];  Service: Orthopedics;  Laterality: Left;  . SHOULDER ARTHROSCOPY WITH BANKART REPAIR Left  10/19/2019   Procedure: SHOULDER ARTHROSCOPY WITH BANKART REPAIR, EXTENSIVE DEBRIDEMENT, LOOSE BODY REMOVAL;  Surgeon: Bjorn Pippin, MD;  Location: [facility];  Service: Orthopedics;  Laterality: Left;  . STRABISMUS SURGERY    . TONSILLECTOMY     Past Surgical History:  Procedure Laterality Date  . APPENDECTOMY  1964  . BOTOX INJECTION N/A 02/28/2020   Procedure: CYSTOSCOPY BOTOX 100 UNITS INJECTION;  Surgeon: Alfredo Martinez, MD;  Location[facility];  Service: Urology;  Laterality: N/A;  . CERVICAL FUSION  2000   c 5 to c 7   . EYE SURGERY Bilateral 1962   eye muscle sx  . INSERTION OF SUPRAPUBIC CATHETER N/A 02/28/2020   Procedure: INSERTION OF SUPRAPUBIC CATHETER;  Surgeon:  Alfredo Martinez, MD;  Location: [facility];  Service: Urology;  Laterality: N/A;  . KNEE SURGERY Right   . LUMBAR SPINE SURGERY  10/16/2019   L1 burst fracture with epidural hematoma and canal stenosis  . OPEN REDUCTION INTERNAL FIXATION (ORIF) TIBIA/FIBULA FRACTURE Left 10/19/2019   Procedure: OPEN REDUCTION INTERNAL FIXATION (ORIF) TIBIA/FIBULA FRACTURE;  Surgeon: Roby Lofts, MD;  Location[facility];  Service: Orthopedics;  Laterality: Left;  . SHOULDER ARTHROSCOPY WITH BANKART REPAIR Left 10/19/2019   Procedure: SHOULDER ARTHROSCOPY WITH BANKART REPAIR, EXTENSIVE DEBRIDEMENT, LOOSE BODY REMOVAL;  Surgeon: Bjorn Pippin, MD;  Location: [facility];  Service: Orthopedics;  Laterality: Left;  . STRABISMUS SURGERY    . TONSILLECTOMY     Past Medical History:  Diagnosis Date  . Anemia   . Arthritis   . Bundle branch block, right   . Closed left ankle fracture   . Complication of anesthesia   . Compression fracture of lumbar vertebrae, non-traumatic (HCC)   . Foley catheter in place    last changed 2 weeks ago  . GERD (gastroesophageal reflux disease)   . Hand pain    mild OA bilateral  hands  . Left knee injury 1976   requiring surgery for repair of ligaments/cartilage damage with nerve injury that took 20 years to resolve  . MVP (mitral valve prolapse)    mild no cardiologist  . Neurogenic bladder   . Neurogenic bowel   . PONV (postoperative nausea and vomiting)   . Spinal cord injury at T7-T12 level (HCC) 10/15/2019   due to accident 10-15-2019 fell 18 feet on concrete   BP 113/76   Pulse 61   Temp 98.6 F (37 C)   Ht 6' (1.829 m)   Wt 138 lb 6.4 oz (62.8 kg)   SpO2 95%   BMI 18.77 kg/m   Opioid Risk Score:   Fall Risk Score:  `1  Depression screen PHQ 2/9  Depression screen [facility] 2/9 09/10/2020 07/30/2020 11/14/2019  Decreased Interest 1 1 0  Down, Depressed, Hopeless 1 1 2   PHQ - 2 Score 2 2 2   Altered sleeping - - 1  Tired, decreased energy - - 1   Change in appetite - - 0  Feeling bad or failure about yourself  - - 3  Trouble concentrating - - 1  Moving slowly or fidgety/restless - - 3  Suicidal thoughts - - 0  PHQ-9 Score - - 11  Difficult doing work/chores - - Somewhat difficult    Review of Systems  Musculoskeletal: Positive for back pain.       Pelvic pain Feet pain  All other systems reviewed and are negative.      Objective:   Physical Exam  Assessment & Plan:   Pt is a 65 yr old male with Cauda equina syndrome and neurogenic bladder, and bowel, and severe nerve painwhich is impairing functionfor f/u.  1. Will try Butrans patch at pharmacy- 7.5 mcg/hour - on the lower end of pain meds.  - is a partial antagonist  2. Will keep  Methadone dose and nucynta the same. To see if Lavera Guise is working- if it helps, but not enough, will increase butrans, and stop methadone.    3. If DOESN'T work very well, will increase methadone dose instead and will call Dr Lorrine Kin about spinal cord stimulator.    4. Send in refills for Methadone 5 mg BID and Nucynta 100 mg QID. Doesn't need other med refills right now.  Still on gabapentin 600 mg TID , diclofenac 75 mg BID and baclofen 5 mg TID. Refill Trazodone- refilled 3 month supply with 3 refills.   5. Talk to Dr Lorrine Kin about if spinal cord stimulator doesn't work, about pain pump or nerve blocks.   6. F/U in 6-8 weeks due to snow issues-    I spent a total of 30 minutes on visit- as detailed above.

## 2021-02-15 ENCOUNTER — Encounter: Payer: Commercial Managed Care - PPO | Admitting: Physical Medicine and Rehabilitation

## 2021-02-18 MED ORDER — TAPENTADOL HCL 100 MG PO TABS: 100.0000 mg | ORAL_TABLET | Freq: Four times a day (QID) | ORAL | 0 refills | Status: DC | PRN

## 2021-02-18 MED ORDER — METHADONE HCL 5 MG PO TABS: 5.0000 mg | ORAL_TABLET | Freq: Two times a day (BID) | ORAL | 0 refills | Status: DC

## 2021-02-18 NOTE — Telephone Encounter (Signed)
I have refilled both methadone and Nucynta medications- Nucynta 100 mg-120#  QID prn and Methadone 5 mg BID- #60

## 2021-03-08 ENCOUNTER — Encounter: Payer: Commercial Managed Care - PPO | Admitting: Physical Medicine and Rehabilitation

## 2021-03-17 ENCOUNTER — Other Ambulatory Visit: Payer: Self-pay | Admitting: Physical Medicine and Rehabilitation

## 2021-03-20 MED ORDER — METHADONE HCL 5 MG PO TABS: 5.0000 mg | ORAL_TABLET | Freq: Two times a day (BID) | ORAL | 0 refills | Status: DC

## 2021-03-21 MED ORDER — METHADONE HCL 5 MG PO TABS: 5.0000 mg | ORAL_TABLET | Freq: Two times a day (BID) | ORAL | 0 refills | Status: DC

## 2021-03-21 NOTE — Addendum Note (Signed)
Addended by: Genice Rouge on: 03/21/2021 07:21 PM   Modules accepted: Orders

## 2021-03-27 ENCOUNTER — Other Ambulatory Visit: Payer: Self-pay

## 2021-03-27 ENCOUNTER — Encounter
Payer: Medicare Other | Attending: Physical Medicine and Rehabilitation | Admitting: Physical Medicine and Rehabilitation

## 2021-03-27 ENCOUNTER — Encounter: Payer: Self-pay | Admitting: Physical Medicine and Rehabilitation

## 2021-03-27 VITALS — BP 118/75 | HR 61 | Temp 98.6°F | Ht 72.0 in | Wt 132.0 lb

## 2021-03-27 DIAGNOSIS — M792 Neuralgia and neuritis, unspecified: Secondary | ICD-10-CM

## 2021-03-27 DIAGNOSIS — N319 Neuromuscular dysfunction of bladder, unspecified: Secondary | ICD-10-CM | POA: Insufficient documentation

## 2021-03-27 DIAGNOSIS — G894 Chronic pain syndrome: Secondary | ICD-10-CM | POA: Diagnosis present

## 2021-03-27 DIAGNOSIS — G834 Cauda equina syndrome: Secondary | ICD-10-CM | POA: Diagnosis not present

## 2021-03-27 DIAGNOSIS — F329 Major depressive disorder, single episode, unspecified: Secondary | ICD-10-CM | POA: Insufficient documentation

## 2021-03-27 DIAGNOSIS — S343XXS Injury of cauda equina, sequela: Secondary | ICD-10-CM | POA: Diagnosis not present

## 2021-03-27 MED ORDER — METHADONE HCL 5 MG PO TABS: 10.0000 mg | ORAL_TABLET | Freq: Two times a day (BID) | ORAL | 0 refills | Status: DC

## 2021-03-27 MED ORDER — PROMETHAZINE HCL 12.5 MG PO TABS: ORAL_TABLET | ORAL | 5 refills | Status: DC

## 2021-03-27 NOTE — Patient Instructions (Addendum)
  Pt is a 65 yr old male with Cauda equina syndrome and neurogenic bladder, and bowel, and severe nerve painwhich is impairing functionfor f/u.  On Macrobid-   1. If doesn't have Cdiff, which sounds like doesn't- and lab 7 days ago said negative- can take Imodium 4 mg in AM- first dose only, and then with every loose/liquid stool, takes another 2 mg up to 16 mg/max in a day.  That will be safe- be careful about constipation.   2. Will try to increase Methadone to see if can help- will increase to 10 mg 2x/day- not easy to break it, it's so small.   3. Suggested taking meds at bedside and can take with cracker- so can take before gets out meds.   4. Will run out of current Methadone Rx early- so will send in new Rx for 5 mg tabs 10 mg BID #120- now and will be able to be refilled early since dose changed.   5. F/U in 2 months

## 2021-03-27 NOTE — Progress Notes (Signed)
Subjective:    Patient ID: Louis Jordan, male    DOB: September 17, 1956, 65 y.o.   MRN: [id number]  HPI    Pt is a 65 yr old male with Cauda equina syndrome and neurogenic bladder, and bowel, and severe nerve painwhich is impairing functionfor f/u.   Had pneumonia and UTI in early March-  As a result, still on PO ABX til tomorrow? Has had diarrhea- for 4 weeks.  Still on PO ABX-  Got stool sample last week- via PCP- every test was (-) for C diff.   Liquids smelly stools.  Has tried Imodium 1 tab/day.   Tomorrow is last day of PO ABX- don't know what ABX he is on.    Was in hospital at beginning of March for 3 days or so- was dx'd with pneumonia AND UTI.  Went to hospital because was running fever 101+; and vomiting. 99.5 temperature for him and gets REALLY sick.   Pain is the same. Went back- couldn't put in him because has so much hardware in back- no place to put it.   Had a nerve block injection- ~ 10% improvement.  Said learning to live with it.   Cut Nucynta down to 2x/day- had to because was so out of it- when took 3rd dose- was zombied out.    In the morning, takes 45 minutes for meds to work in AM, so first 45 minutes is bad.    Pain Inventory Average Pain 7 Pain Right Now 7 My pain is dull, tingling and aching  LOCATION OF PAIN  Buttocks, groin, leg  BOWEL Number of stools per week: 5 Oral laxative use Yes  Type of laxative na Enema or suppository use No  History of colostomy Yes  Incontinent No   BLADDER Suprapubic In and out cath, frequency na Able to self cath na Bladder incontinence No  Frequent urination No  Leakage with coughing No  Difficulty starting stream No  Incomplete bladder emptying No    Mobility use a cane ability to climb steps?  yes do you drive?  no  Function retired  Neuro/Psych numbness tingling trouble walking  Prior Studies Any changes since last visit?  no  Physicians involved in your care Any changes  since last visit?  no   Family History  Problem Relation Age of Onset  . Heart disease Mother   . Alzheimer's disease Mother   . Pancreatic cancer Father   . High blood pressure Brother    Social History   Socioeconomic History  . Marital status: Married    Spouse name: Not on file  . Number of children: Not on file  . Years of education: Not on file  . Highest education level: Not on file  Occupational History  . Not on file  Tobacco Use  . Smoking status: Never Smoker  . Smokeless tobacco: Never Used  Vaping Use  . Vaping Use: Never used  Substance and Sexual Activity  . Alcohol use: Not Currently  . Drug use: Never  . Sexual activity: Not on file  Other Topics Concern  . Not on file  Social History Narrative  . Not on file   Social Determinants of Health   Financial Resource Strain: Not on file  Food Insecurity: Not on file  Transportation Needs: Not on file  Physical Activity: Not on file  Stress: Not on file  Social Connections: Not on file   Past Surgical History:  Procedure Laterality Date  . APPENDECTOMY  1964  . BOTOX INJECTION N/A 02/28/2020   Procedure: CYSTOSCOPY BOTOX 100 UNITS INJECTION;  Surgeon: Alfredo Martinez, MD;  Location: [facility];  Service: Urology;  Laterality: N/A;  . CERVICAL FUSION  2000   c 5 to c 7   . EYE SURGERY Bilateral 1962   eye muscle sx  . INSERTION OF SUPRAPUBIC CATHETER N/A 02/28/2020   Procedure: INSERTION OF SUPRAPUBIC CATHETER;  Surgeon: Alfredo Martinez, MD;  Location: [facility];  Service: Urology;  Laterality: N/A;  . KNEE SURGERY Right   . LUMBAR SPINE SURGERY  10/16/2019   L1 burst fracture with epidural hematoma and canal stenosis  . OPEN REDUCTION INTERNAL FIXATION (ORIF) TIBIA/FIBULA FRACTURE Left 10/19/2019   Procedure: OPEN REDUCTION INTERNAL FIXATION (ORIF) TIBIA/FIBULA FRACTURE;  Surgeon: Roby Lofts, MD;  Location: [facility];  Service: Orthopedics;  Laterality: Left;  .  SHOULDER ARTHROSCOPY WITH BANKART REPAIR Left 10/19/2019   Procedure: SHOULDER ARTHROSCOPY WITH BANKART REPAIR, EXTENSIVE DEBRIDEMENT, LOOSE BODY REMOVAL;  Surgeon: Bjorn Pippin, MD;  Location: [facility];  Service: Orthopedics;  Laterality: Left;  . STRABISMUS SURGERY    . TONSILLECTOMY     Past Medical History:  Diagnosis Date  . Anemia   . Arthritis   . Bundle branch block, right   . Closed left ankle fracture   . Complication of anesthesia   . Compression fracture of lumbar vertebrae, non-traumatic (HCC)   . Foley catheter in place    last changed 2 weeks ago  . GERD (gastroesophageal reflux disease)   . Hand pain    mild OA bilateral  hands  . Left knee injury 1976   requiring surgery for repair of ligaments/cartilage damage with nerve injury that took 20 years to resolve  . MVP (mitral valve prolapse)    mild no cardiologist  . Neurogenic bladder   . Neurogenic bowel   . PONV (postoperative nausea and vomiting)   . Spinal cord injury at T7-T12 level (HCC) 10/15/2019   due to accident 10-15-2019 fell 18 feet on concrete   BP 118/75   Pulse 61   Temp 98.6 F (37 C)   Ht 6' (1.829 m)   Wt 132 lb (59.9 kg)   SpO2 94%   BMI 17.90 kg/m   Opioid Risk Score:   Fall Risk Score:  `1  Depression screen PHQ 2/9  Depression screen [facility] 2/9 09/10/2020 07/30/2020 11/14/2019  Decreased Interest 1 1 0  Down, Depressed, Hopeless 1 1 2   PHQ - 2 Score 2 2 2   Altered sleeping - - 1  Tired, decreased energy - - 1  Change in appetite - - 0  Feeling bad or failure about yourself  - - 3  Trouble concentrating - - 1  Moving slowly or fidgety/restless - - 3  Suicidal thoughts - - 0  PHQ-9 Score - - 11  Difficult doing work/chores - - Somewhat difficult     Review of Systems  Musculoskeletal:       Groin, leg, buttocks pain  All other systems reviewed and are negative.      Objective:   Physical Exam Awake, alert, using cane this time- because wife is not with him, GAUNT,  NAD Using cane with gait- antalgic gait No spasms seen But can feel his spine       Assessment & Plan:   Pt is a 65 yr old male with Cauda equina syndrome and neurogenic bladder, and bowel, and severe nerve painwhich is  impairing functionfor f/u.  On Macrobid- PO ABX.   1. If doesn't have Cdiff, which sounds like doesn't- and lab 7 days ago said negative- can take Imodium 4 mg in AM- first dose only, and then with every loose/liquid stool, takes another 2 mg up to 16 mg/max in a day.  That will be safe- be careful about constipation.   2. Will try to increase Methadone to see if can help- will increase to 10 mg 2x/day- not easy to break it, it's so small.   3. Suggested taking meds at bedside and can take with cracker- so can take before gets out meds.   4. Will run out of current Methadone Rx early- so will send in new Rx for 5 mg tabs 10 mg BID #120- now and will be able to be refilled early since dose changed.   5. F/U in 2 months- of note, only taking Nucynta 2x/day, not 4x/day  6. Refill Phenergan increased to 90 tabs/month with 5 refills.    I spent a total of 24 minutes on visit- discussing options for pain control- and deciding to increase Methadone and see how it goes .

## 2021-04-18 ENCOUNTER — Other Ambulatory Visit: Payer: Self-pay | Admitting: Physical Medicine and Rehabilitation

## 2021-05-29 ENCOUNTER — Other Ambulatory Visit: Payer: Self-pay

## 2021-05-29 ENCOUNTER — Encounter
Payer: Medicare Other | Attending: Physical Medicine and Rehabilitation | Admitting: Physical Medicine and Rehabilitation

## 2021-05-29 ENCOUNTER — Encounter: Payer: Self-pay | Admitting: Physical Medicine and Rehabilitation

## 2021-05-29 ENCOUNTER — Other Ambulatory Visit: Payer: Self-pay | Admitting: Physical Medicine and Rehabilitation

## 2021-05-29 VITALS — BP 125/69 | HR 54 | Temp 98.6°F | Ht 72.0 in | Wt 127.8 lb

## 2021-05-29 DIAGNOSIS — M792 Neuralgia and neuritis, unspecified: Secondary | ICD-10-CM | POA: Diagnosis present

## 2021-05-29 DIAGNOSIS — Z79891 Long term (current) use of opiate analgesic: Secondary | ICD-10-CM | POA: Diagnosis present

## 2021-05-29 DIAGNOSIS — Z5181 Encounter for therapeutic drug level monitoring: Secondary | ICD-10-CM

## 2021-05-29 DIAGNOSIS — G894 Chronic pain syndrome: Secondary | ICD-10-CM | POA: Diagnosis present

## 2021-05-29 DIAGNOSIS — F329 Major depressive disorder, single episode, unspecified: Secondary | ICD-10-CM

## 2021-05-29 DIAGNOSIS — S343XXS Injury of cauda equina, sequela: Secondary | ICD-10-CM | POA: Diagnosis present

## 2021-05-29 DIAGNOSIS — F4321 Adjustment disorder with depressed mood: Secondary | ICD-10-CM | POA: Diagnosis present

## 2021-05-29 MED ORDER — CYPROHEPTADINE HCL 4 MG PO TABS: 4.0000 mg | ORAL_TABLET | Freq: Every day | ORAL | 0 refills | Status: DC

## 2021-05-29 MED ORDER — METHADONE HCL 5 MG PO TABS: 10.0000 mg | ORAL_TABLET | Freq: Two times a day (BID) | ORAL | 0 refills | Status: DC

## 2021-05-29 MED ORDER — TAPENTADOL HCL 100 MG PO TABS: 100.0000 mg | ORAL_TABLET | Freq: Four times a day (QID) | ORAL | 0 refills | Status: DC | PRN

## 2021-05-29 NOTE — Progress Notes (Deleted)
Subjective:    Patient ID: Louis Jordan, male    DOB: 12/17/1955, 65 y.o.   MRN: [id number]  HPI  Pain Inventory Average Pain 7 Pain Right Now 7 My pain is tingling and aching  LOCATION OF PAIN  buttocks and groin  BOWEL Number of stools per week: 5 Oral laxative use Yes  Type of laxative miralax Enema or suppository use No  History of colostomy Yes  Incontinent No   BLADDER Suprapubic    Mobility use a cane ability to climb steps?  yes do you drive?  no  Function retired  Neuro/Psych numbness tingling trouble walking  Prior Studies Any changes since last visit?  no  Physicians involved in your care Any changes since last visit?  no   Family History  Problem Relation Age of Onset   Heart disease Mother    Alzheimer's disease Mother    Pancreatic cancer Father    High blood pressure Brother    Social History   Socioeconomic History   Marital status: Married    Spouse name: Not on file   Number of children: Not on file   Years of education: Not on file   Highest education level: Not on file  Occupational History   Not on file  Tobacco Use   Smoking status: Never   Smokeless tobacco: Never  Vaping Use   Vaping Use: Never used  Substance and Sexual Activity   Alcohol use: Not Currently   Drug use: Never   Sexual activity: Not on file  Other Topics Concern   Not on file  Social History Narrative   Not on file   Social Determinants of Health   Financial Resource Strain: Not on file  Food Insecurity: Not on file  Transportation Needs: Not on file  Physical Activity: Not on file  Stress: Not on file  Social Connections: Not on file   Past Surgical History:  Procedure Laterality Date   APPENDECTOMY  1964   BOTOX INJECTION N/A 02/28/2020   Procedure: CYSTOSCOPY BOTOX 100 UNITS INJECTION;  Surgeon: Alfredo Martinez, MD;  Location: [facility];  Service: Urology;  Laterality: N/A;   CERVICAL FUSION  2000   c 5 to c  7    EYE SURGERY Bilateral 1962   eye muscle sx   INSERTION OF SUPRAPUBIC CATHETER N/A 02/28/2020   Procedure: INSERTION OF SUPRAPUBIC CATHETER;  Surgeon: Alfredo Martinez, MD;  Location: [facility];  Service: Urology;  Laterality: N/A;   KNEE SURGERY Right    LUMBAR SPINE SURGERY  10/16/2019   L1 burst fracture with epidural hematoma and canal stenosis   OPEN REDUCTION INTERNAL FIXATION (ORIF) TIBIA/FIBULA FRACTURE Left 10/19/2019   Procedure: OPEN REDUCTION INTERNAL FIXATION (ORIF) TIBIA/FIBULA FRACTURE;  Surgeon: Roby Lofts, MD;  Location: [facility];  Service: Orthopedics;  Laterality: Left;   SHOULDER ARTHROSCOPY WITH BANKART REPAIR Left 10/19/2019   Procedure: SHOULDER ARTHROSCOPY WITH BANKART REPAIR, EXTENSIVE DEBRIDEMENT, LOOSE BODY REMOVAL;  Surgeon: Bjorn Pippin, MD;  Location: [facility];  Service: Orthopedics;  Laterality: Left;   STRABISMUS SURGERY     TONSILLECTOMY     Past Medical History:  Diagnosis Date   Anemia    Arthritis    Bundle branch block, right    Closed left ankle fracture    Complication of anesthesia    Compression fracture of lumbar vertebrae, non-traumatic (HCC)    Foley catheter in place    last changed 2 weeks ago  GERD (gastroesophageal reflux disease)    Hand pain    mild OA bilateral  hands   Left knee injury 1976   requiring surgery for repair of ligaments/cartilage damage with nerve injury that took 20 years to resolve   MVP (mitral valve prolapse)    mild no cardiologist   Neurogenic bladder    Neurogenic bowel    PONV (postoperative nausea and vomiting)    Spinal cord injury at T7-T12 level (HCC) 10/15/2019   due to accident 10-15-2019 fell 18 feet on concrete   There were no vitals taken for this visit.  Opioid Risk Score:   Fall Risk Score:  `1  Depression screen PHQ 2/9  Depression screen [facility] 2/9 09/10/2020 07/30/2020 11/14/2019  Decreased Interest 1 1 0  Down, Depressed, Hopeless 1 1 2   PHQ - 2 Score 2 2 2   Altered  sleeping - - 1  Tired, decreased energy - - 1  Change in appetite - - 0  Feeling bad or failure about yourself  - - 3  Trouble concentrating - - 1  Moving slowly or fidgety/restless - - 3  Suicidal thoughts - - 0  PHQ-9 Score - - 11  Difficult doing work/chores - - Somewhat difficult     Review of Systems  Constitutional: Negative.   HENT: Negative.    Eyes: Negative.   Respiratory: Negative.    Cardiovascular: Negative.   Gastrointestinal: Negative.   Endocrine: Negative.   Genitourinary: Negative.   Musculoskeletal:        Groin leg and buttocks  Skin: Negative.   Allergic/Immunologic: Negative.   Neurological: Negative.   Hematological: Negative.   Psychiatric/Behavioral: Negative.    All other systems reviewed and are negative.     Objective:   Physical Exam        Assessment & Plan:

## 2021-05-29 NOTE — Patient Instructions (Signed)
Pt is a 65 yr old male with Cauda equina syndrome and neurogenic bladder, and bowel, and severe nerve pain which is impairing function- here for f/u on cauda equina and Chronic pain.    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  Isn't working anymore- on Tree surgeon-  3. Refilled Methadone- only taking 5 mg BID- and Nucynta 100 mg QID prn- #120.  4. See if GI wants /allows him t continue NSAID/Diclofenac.  5. Strength doing much better, but stil trace off. Will con't cane 6. Cyproheptadine- 2-4 mg  (12 to 1 tab) nightly- for appetite. Takes a few days to kick in- can be sedating.  7. Have to fail other meds to get Marinol- Have to fail 2 things- prior.   8. Referral to Dr Barrie Dunker- for seeing if there's anything else he can help with for chronic pain.  Ketamine or other intervention- cannot get nerve stimulator.  9. F/U in 3 months-

## 2021-05-29 NOTE — Progress Notes (Signed)
Subjective:    Patient ID: Louis Jordan, male    DOB: Jul 13, 1956, 65 y.o.   MRN: [id number]  HPI  Pt is a 65 yr old male with Cauda equina syndrome and neurogenic bladder, and bowel, and severe nerve pain which is impairing function- here for f/u on cauda equina and Chronic pain.    Pancreas has been messed up- due to a medicine he was taking.  On Creon now- food wasn't getting digested.  Hospitalized in March- was on so many ABX.  Had diarrhea for 10 weeks- but Creon helping.   Pain the same. Awful. Couldn't get the paddles in the right place.  Went to Dr Lorrine Kin Couldn't do the spinal cord stimulator.  Got an injection- into low back- first injection helped 30%- 2nd injection didn't help at all.   Going to see Dr Yetta Barre- [facility] to see him 6/28-   Switch to Social Security   Bladder- when saw Urologist-  Could feel when changed the [facility].  Going to do Cystogram- had infection 2x- so just got done 2-3 weeks ago- finally got Cystogram- was a blockage! Prostate deep inside, pushing on bladder from prostate- scheduled 7/11 to correct prostate. Will keep in hospital 2 nights in [place], [place].   Nausea- has to take Phenergan 1-2x/day  Could be pancreas issues.   Saw has heart aneurysm- aortic aneurysm- 4.2 cm- sent to C[facility]- has to have surgery if gets to 4.5cm- f/u in August.   Poor memory- > meds?   Pain Inventory Average Pain 7 Pain Right Now 7 My pain is dull, tingling, and aching  In the last 24 hours, has pain interfered with the following? General activity 7 Relation with others 7 Enjoyment of life 7 What TIME of day is your pain at its worst? morning  Sleep (in general) Good  Pain is worse with: bending, sitting, and some activites Pain improves with: medication Relief from Meds: 5  Family History  Problem Relation Age of Onset   Heart disease Mother    Alzheimer's disease Mother    Pancreatic cancer Father    High blood pressure Brother    Social History    Socioeconomic History   Marital status: Married    Spouse name: Not on file   Number of children: Not on file   Years of education: Not on file   Highest education level: Not on file  Occupational History   Not on file  Tobacco Use   Smoking status: Never   Smokeless tobacco: Never  Vaping Use   Vaping Use: Never used  Substance and Sexual Activity   Alcohol use: Not Currently   Drug use: Never   Sexual activity: Not on file  Other Topics Concern   Not on file  Social History Narrative   Not on file   Social Determinants of Health   Financial Resource Strain: Not on file  Food Insecurity: Not on file  Transportation Needs: Not on file  Physical Activity: Not on file  Stress: Not on file  Social Connections: Not on file   Past Surgical History:  Procedure Laterality Date   APPENDECTOMY  1964   BOTOX INJECTION N/A 02/28/2020   Procedure: CYSTOSCOPY BOTOX 100 UNITS INJECTION;  Surgeon: Alfredo Martinez, MD;  Location: [facility];  Service: Urology;  Laterality: N/A;   CERVICAL FUSION  2000   c 5 to c 7    EYE SURGERY Bilateral 1962   eye muscle sx  INSERTION OF SUPRAPUBIC CATHETER N/A 02/28/2020   Procedure: INSERTION OF SUPRAPUBIC CATHETER;  Surgeon: Alfredo Martinez, MD;  Location: [facility];  Service: Urology;  Laterality: N/A;   KNEE SURGERY Right    LUMBAR SPINE SURGERY  10/16/2019   L1 burst fracture with epidural hematoma and canal stenosis   OPEN REDUCTION INTERNAL FIXATION (ORIF) TIBIA/FIBULA FRACTURE Left 10/19/2019   Procedure: OPEN REDUCTION INTERNAL FIXATION (ORIF) TIBIA/FIBULA FRACTURE;  Surgeon: Roby Lofts, MD;  [facility];  Service: Orthopedics;  Laterality: Left;   SHOULDER ARTHROSCOPY WITH BANKART REPAIR Left 10/19/2019   Procedure: SHOULDER ARTHROSCOPY WITH BANKART REPAIR, EXTENSIVE DEBRIDEMENT, LOOSE BODY REMOVAL;  Surgeon: Bjorn Pippin, MD;  Location: [facility];  Service: Orthopedics;  Laterality: Left;    STRABISMUS SURGERY     TONSILLECTOMY     Past Surgical History:  Procedure Laterality Date   APPENDECTOMY  1964   BOTOX INJECTION N/A 02/28/2020   Procedure: CYSTOSCOPY BOTOX 100 UNITS INJECTION;  Surgeon: Alfredo Martinez, MD;  Location: [facility] [place];  Service: Urology;  Laterality: N/A;   CERVICAL FUSION  2000   c 5 to c 7    EYE SURGERY Bilateral 1962   eye muscle sx   INSERTION OF SUPRAPUBIC CATHETER N/A 02/28/2020   Procedure: INSERTION OF SUPRAPUBIC CATHETER;  Surgeon: Alfredo Martinez, MD;  Location: [facility];  Service: Urology;  Laterality: N/A;   KNEE SURGERY Right    LUMBAR SPINE SURGERY  10/16/2019   L1 burst fracture with epidural hematoma and canal stenosis   OPEN REDUCTION INTERNAL FIXATION (ORIF) TIBIA/FIBULA FRACTURE Left 10/19/2019   Procedure: OPEN REDUCTION INTERNAL FIXATION (ORIF) TIBIA/FIBULA FRACTURE;  Surgeon: Roby Lofts, MD;  [facility];  Service: Orthopedics;  Laterality: Left;   SHOULDER ARTHROSCOPY WITH BANKART REPAIR Left 10/19/2019   Procedure: SHOULDER ARTHROSCOPY WITH BANKART REPAIR, EXTENSIVE DEBRIDEMENT, LOOSE BODY REMOVAL;  Surgeon: Bjorn Pippin, MD;  [facility];  Service: Orthopedics;  Laterality: Left;   STRABISMUS SURGERY     TONSILLECTOMY     Past Medical History:  Diagnosis Date   Anemia    Arthritis    Bundle branch block, right    Closed left ankle fracture    Complication of anesthesia    Compression fracture of lumbar vertebrae, non-traumatic (HCC)    Foley catheter in place    last changed 2 weeks ago   GERD (gastroesophageal reflux disease)    Hand pain    mild OA bilateral  hands   Left knee injury 1976   requiring surgery for repair of ligaments/cartilage damage with nerve injury that took 20 years to resolve   MVP (mitral valve prolapse)    mild no cardiologist   Neurogenic bladder    Neurogenic bowel    PONV (postoperative nausea and vomiting)    Spinal cord injury at T7-T12  level (HCC) 10/15/2019   due to accident 10-15-2019 fell 18 feet on concrete   BP 125/69   Pulse (!) 54   Temp 98.6 F (37 C)   Ht 6' (1.829 m)   Wt 127 lb 12.8 oz (58 kg)   SpO2 91%   BMI 17.33 kg/m   Opioid Risk Score:   Fall Risk Score:  `1  Depression screen PHQ 2/9  Depression screen [facility] 2/9 05/29/2021 09/10/2020 07/30/2020 11/14/2019  Decreased Interest 1 1 1  0  Down, Depressed, Hopeless 1 1 1 2   PHQ - 2 Score 2 2 2 2   Altered  sleeping - - - 1  Tired, decreased energy - - - 1  Change in appetite - - - 0  Feeling bad or failure about yourself  - - - 3  Trouble concentrating - - - 1  Moving slowly or fidgety/restless - - - 3  Suicidal thoughts - - - 0  PHQ-9 Score - - - 11  Difficult doing work/chores - - - Somewhat difficult     Review of Systems  Constitutional: Negative.   HENT: Negative.    Eyes: Negative.   Respiratory: Negative.    Cardiovascular: Negative.   Gastrointestinal: Negative.   Endocrine: Negative.   Genitourinary: Negative.   Musculoskeletal:  Positive for gait problem.  Skin: Negative.   Allergic/Immunologic: Negative.   Neurological:        Tingling  Hematological: Negative.   Psychiatric/Behavioral:  Positive for dysphoric mood.   All other systems reviewed and are negative.     Objective:   Physical Exam  Awake alert, depressed affect; wife here; very cachetic, NAD Walking with single point cane  MS: LE's  5/5 in HF, KE, DF and PF B/L       Assessment & Plan:   Pt is a 65 yr old male with Cauda equina syndrome and neurogenic bladder, and bowel, and severe nerve pain which is impairing function- here for f/u on cauda equina and Chronic pain.    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  Isn't working anymore- on Tree surgeon-  3. Refilled Methadone- only taking 5 mg BID- and Nucynta 100 mg QID prn- #120.  4. See if GI wants /allows him t continue NSAID/Diclofenac.  5. Strength doing much better, but stil  trace off. Will con't cane 6. Cyproheptadine- 2-4 mg  (12 to 1 tab) nightly- for appetite. Takes a few days to kick in- can be sedating.  7. Have to fail other meds to get Marinol- Have to fail 2 things- prior.   8. Referral to Dr Barrie Dunker- for seeing if there's anything else he can help with for chronic pain.  Ketamine or other intervention- cannot get nerve stimulator.  9. F/U in 3 months-    I spent a total of 35 minutes on visit- discussing other referral for pain- as well as appetite stimulation, and pancreatic issues.

## 2021-06-02 LAB — DRUG TOX MONITOR 1 W/CONF, ORAL FLD
Amphetamines: NEGATIVE ng/mL (ref <10)
Barbiturates: NEGATIVE ng/mL (ref <10)
Benzodiazepines: NEGATIVE ng/mL (ref <0.50)
Buprenorphine: NEGATIVE ng/mL (ref <0.10)
Cocaine: NEGATIVE ng/mL (ref <5.0)
Fentanyl: NEGATIVE ng/mL (ref <0.10)
Heroin Metabolite: NEGATIVE ng/mL (ref <1.0)
MARIJUANA: NEGATIVE ng/mL (ref <2.5)
MDMA: NEGATIVE ng/mL (ref <10)
Meprobamate: NEGATIVE ng/mL (ref <2.5)
Methadone: NEGATIVE ng/mL (ref <5.0)
Nicotine Metabolite: NEGATIVE ng/mL (ref <5.0)
Opiates: NEGATIVE ng/mL (ref <2.5)
Phencyclidine: NEGATIVE ng/mL (ref <10)
Tapentadol: 39.1 ng/mL — ABNORMAL HIGH (ref <5.0)
Tapentadol: POSITIVE ng/mL — AB (ref <5.0)
Tramadol: NEGATIVE ng/mL (ref <5.0)
Zolpidem: NEGATIVE ng/mL (ref <5.0)

## 2021-06-02 LAB — DRUG TOX METHYLPHEN W/CONF,ORAL FLD: Methylphenidate: NEGATIVE ng/mL (ref <1.0)

## 2021-06-10 ENCOUNTER — Telehealth: Payer: Self-pay | Admitting: *Deleted

## 2021-06-10 NOTE — Telephone Encounter (Signed)
Oral swab drug screen was consistent for prescribed medications.  ?

## 2021-06-24 HISTORY — PX: PROSTATE SURGERY: SHX751

## 2021-06-28 MED ORDER — CYPROHEPTADINE HCL 4 MG PO TABS: ORAL_TABLET | ORAL | 3 refills | Status: DC

## 2021-07-18 IMAGING — RF DG ESOPHAGUS
8 of 11 series · 15 of 24 positions shown · non-contrast
Comparison: 01/13/2020 CT abdomen/pelvis.

CLINICAL DATA: Inpatient. Dysphagia with episodes of midline lower
chest pain radiating to the back.

EXAM:
ESOPHOGRAM/BARIUM SWALLOW
TECHNIQUE: Single contrast examination was performed using  thin barium.
FLUOROSCOPY TIME:  Fluoroscopy Time:  2 minutes 24 seconds
Radiation Exposure Index (if provided by the fluoroscopic device):
35.1 mGy
Number of Acquired Spot Images: 3

[Series 1: cp_standard · 0.35mm/px · 2 of 61 frames shown (1 of 7)]
[frame 1/61]
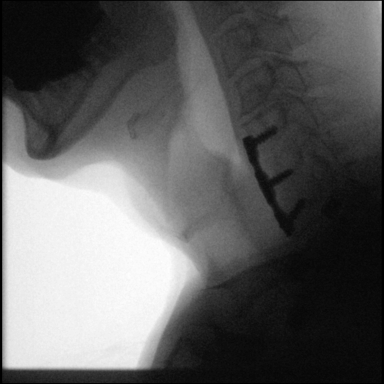
[frame 52/61]
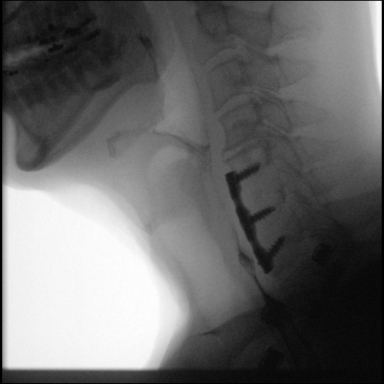

[Series 3: cp_standard · 0.35mm/px · 2 of 47 frames shown (2 of 7)]
[frame 24/47]
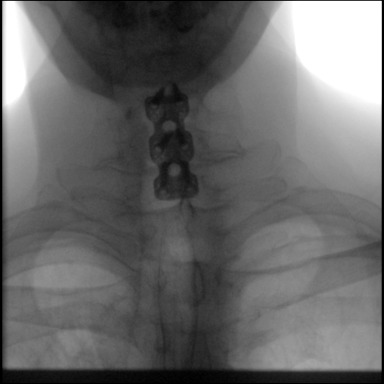
[frame 27/47]
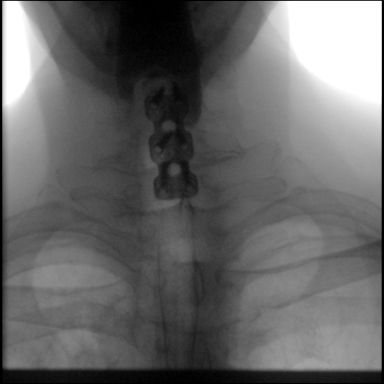

[Series 4: cp_standard · 0.35mm/px · 2 of 114 frames shown (3 of 7)]
[frame 41/114]
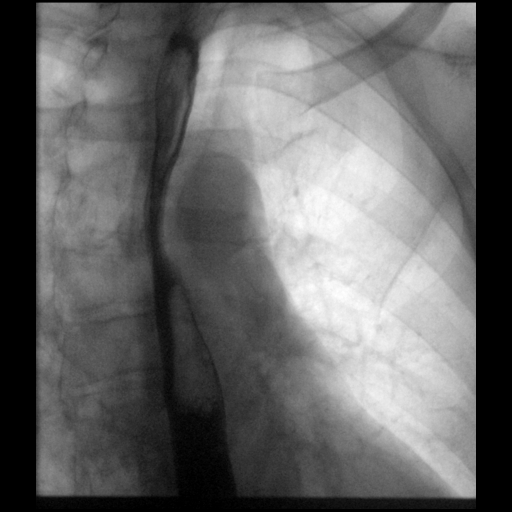
[frame 58/114]
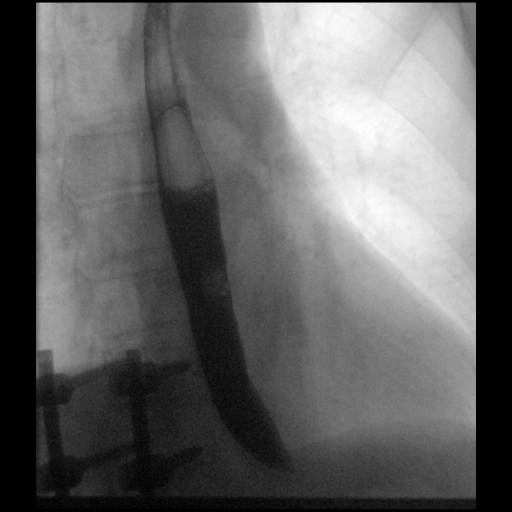

[Series 6: fluoro_barium 2fps_bw · 0.17mm/px · 1 of 1 slices shown]
[im 1/1]
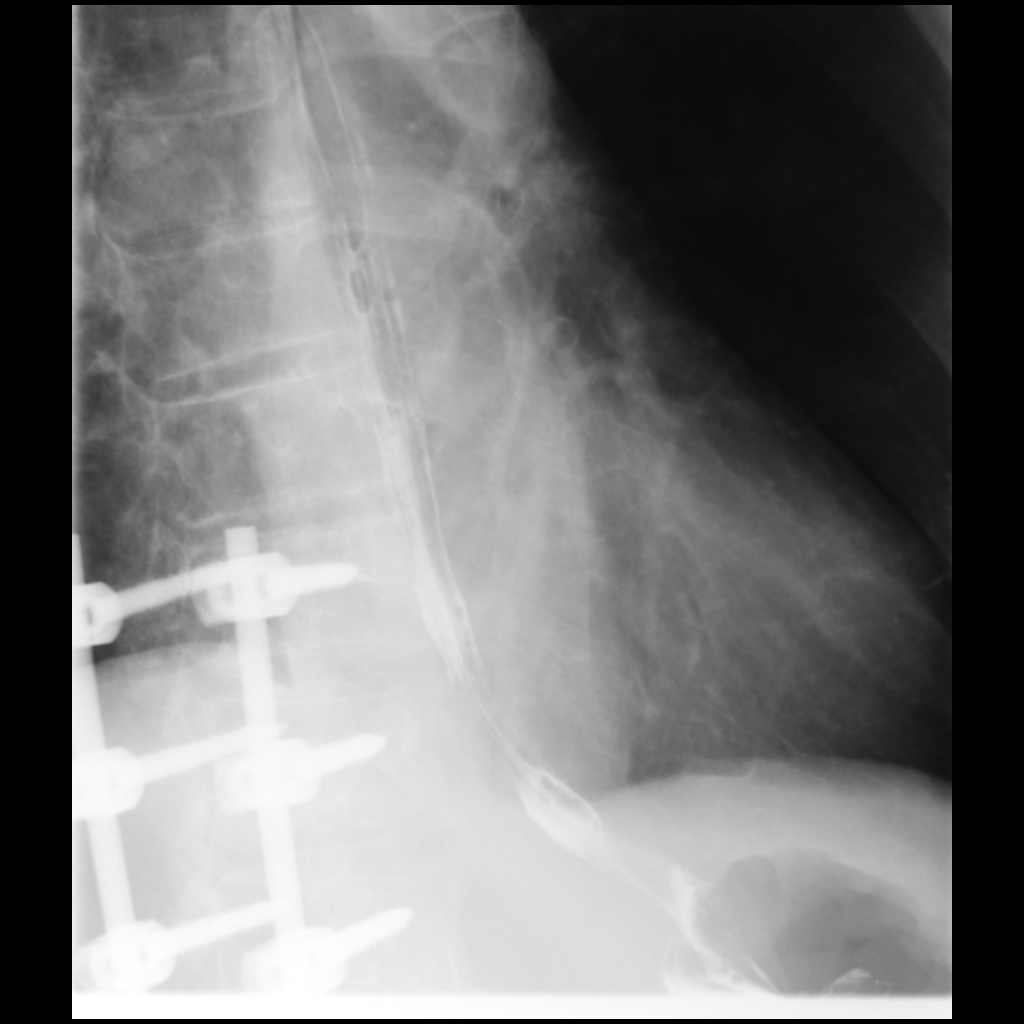

[Series 9: cp_standard · 0.52mm/px · 2 of 164 frames shown (4 of 7)]
[frame 25/164]
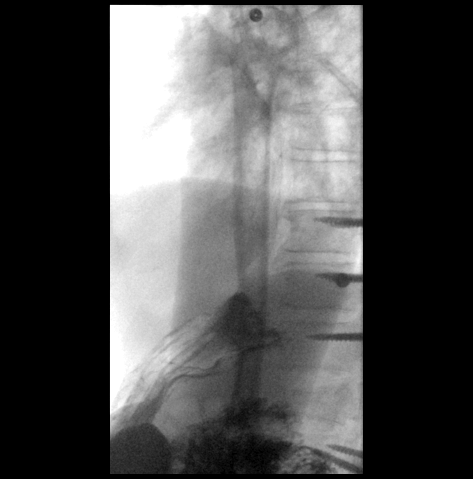
[frame 83/164]
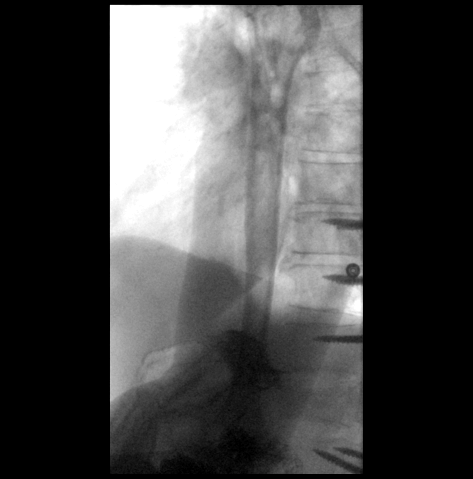

[Series 10: cp_standard · 0.52mm/px · 2 of 250 frames shown (5 of 7)]
[frame 10/250]
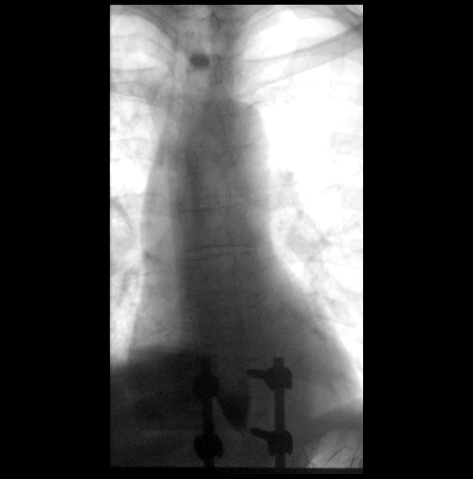
[frame 38/250]
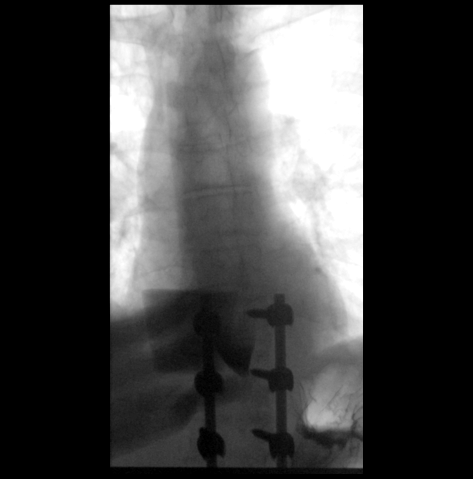

[Series 11: cp_standard · 0.52mm/px · 2 of 181 frames shown (6 of 7)]
[frame 28/181]
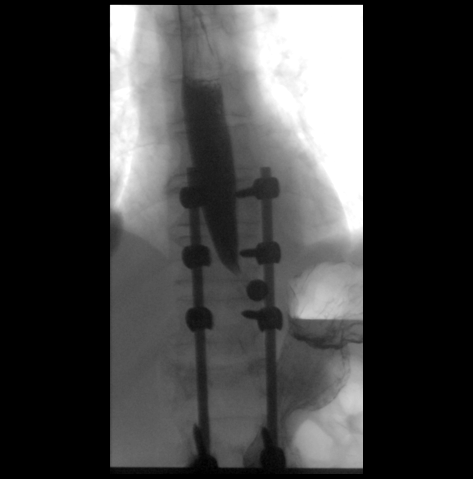
[frame 154/181]
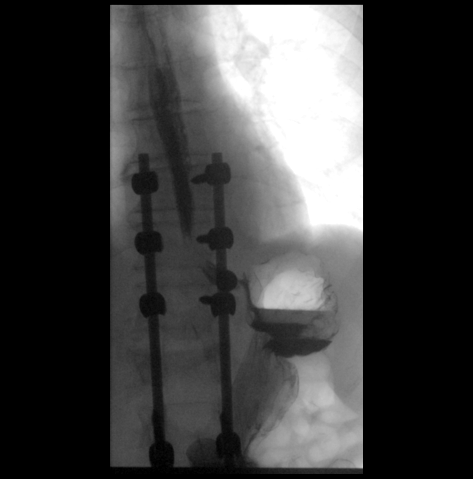

[Series 12: cp_standard · 0.52mm/px · 2 of 96 frames shown (7 of 7)]
[frame 15/96]
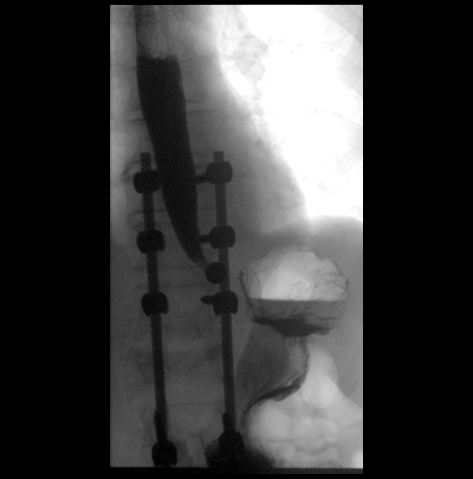
[frame 95/96]
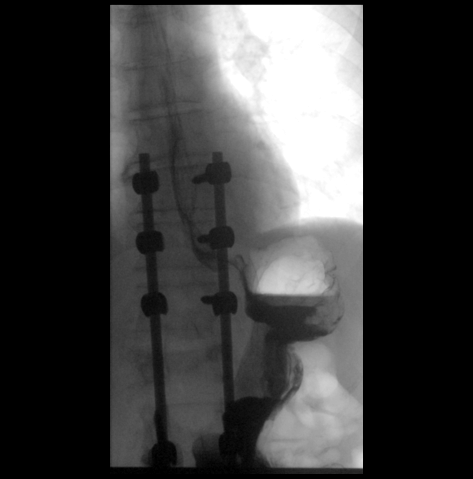

[15 of 24 positions shown; findings below may reference images not displayed]

FINDINGS: Examination mildly limited by patient mobility. Prone swallows were
not attempted. Normal oral and pharyngeal phases of swallowing, with
no laryngeal penetration or tracheobronchial aspiration. No
significant barium retention in the pharynx. No evidence of
pharyngeal mass, stricture or diverticulum. No evidence of
cricopharyngeus muscle dysfunction.

No hiatal hernia. Mild gastroesophageal reflux elicited to the level
of the midthoracic esophagus with water siphon test. Grossly normal
esophageal motility on upright and supine views. Esophageal
distensibility appears grossly normal, with no discrete esophageal
mass, stricture or ulcer detected. The swallowed 13 mm barium tablet
became transiently lodged at the esophagogastric junction for
several seconds, with subsequent clearance into the stomach with
barium swallow.
IMPRESSION: 1. Normal oral and pharyngeal phases of swallowing, with no
laryngeal penetration or tracheobronchial aspiration.
2. Mild gastroesophageal reflux elicited.  No hiatal hernia.
3. No gross evidence of esophageal mass, stricture or ulcer on this
limited inpatient single-contrast esophagram.

## 2021-08-26 ENCOUNTER — Other Ambulatory Visit: Payer: Self-pay

## 2021-08-26 ENCOUNTER — Encounter
Payer: Medicare Other | Attending: Physical Medicine and Rehabilitation | Admitting: Physical Medicine and Rehabilitation

## 2021-08-26 ENCOUNTER — Encounter: Payer: Self-pay | Admitting: Physical Medicine and Rehabilitation

## 2021-08-26 VITALS — BP 126/78 | HR 55 | Temp 97.8°F | Ht 72.0 in | Wt 135.2 lb

## 2021-08-26 DIAGNOSIS — M792 Neuralgia and neuritis, unspecified: Secondary | ICD-10-CM | POA: Insufficient documentation

## 2021-08-26 DIAGNOSIS — G894 Chronic pain syndrome: Secondary | ICD-10-CM | POA: Diagnosis not present

## 2021-08-26 DIAGNOSIS — K592 Neurogenic bowel, not elsewhere classified: Secondary | ICD-10-CM | POA: Diagnosis not present

## 2021-08-26 DIAGNOSIS — S343XXS Injury of cauda equina, sequela: Secondary | ICD-10-CM | POA: Insufficient documentation

## 2021-08-26 DIAGNOSIS — N319 Neuromuscular dysfunction of bladder, unspecified: Secondary | ICD-10-CM | POA: Diagnosis present

## 2021-08-26 DIAGNOSIS — N521 Erectile dysfunction due to diseases classified elsewhere: Secondary | ICD-10-CM | POA: Insufficient documentation

## 2021-08-26 MED ORDER — TAPENTADOL HCL 100 MG PO TABS: 100.0000 mg | ORAL_TABLET | Freq: Four times a day (QID) | ORAL | 0 refills | Status: DC | PRN

## 2021-08-26 MED ORDER — METHADONE HCL 5 MG PO TABS: 10.0000 mg | ORAL_TABLET | Freq: Two times a day (BID) | ORAL | 0 refills | Status: DC

## 2021-08-26 NOTE — Progress Notes (Signed)
Subjective:    Patient ID: Louis Jordan, male    DOB: 21-Apr-1956, 65 y.o.   MRN: [id number]  HPI Pt is a 65 yr old male with Cauda equina syndrome and neurogenic bladder, and bowel, and severe nerve pain which is impairing function- here for f/u on cauda equina and Chronic pain.     Has gained 7 lbs in last 3 months- Periactin going well- 1 ful tab nightly-   Nucynta 100 mg actually takes 2x/day.  Methadone at lunch and bedtime 5 mg BID  Keeps pain constantly at 6/10- but when wears off gets up to 9-10/10.   Dr Roderic Ovens- gave him an injection- a nerve block- 1- injection- low back.  Early July 2022. Saw him in 2-3 weeks from when I referred.  Helped marginally- didn't do what he hoped it would do.  Supposed to go back and see him 10/11.   Had prostate surgery- 06/24/21.  Prostate had gotten so big- couldn't examine bladder- Had TURP- still not peeing on own.  Saw again Friday. Feeling bladder fill up- had small blockage at tip of penis and had it stretched out Friday.  Has feeling of filling up.   Sold their house and moved- moved to apartment in [place]- going to move into a no maintenance garden home/villa.   Checked testosterone- given testosterone injection x1. And wants to try and boost his level- boost sex drive.  Wants to, but body won't allow him to.  Getting inadequate erection-not very hard/firm; hasn't had ejaculation- pushing to the back.  Put back on iron- last week.   Started on Viagra- 100 mg x 2 days- not working good enough to do that.     Pain Inventory Average Pain 6 Pain Right Now 6 My pain is constant, dull, and aching, throbs  In the last 24 hours, has pain interfered with the following? General activity 6 Relation with others 6 Enjoyment of life 9 What TIME of day is your pain at its worst? evening Sleep (in general) Good  Pain is worse with: sitting Pain improves with: medication Relief from Meds: 6  Family History  Problem Relation Age  of Onset   Heart disease Mother    Alzheimer's disease Mother    Pancreatic cancer Father    High blood pressure Brother    Social History   Socioeconomic History   Marital status: Married    Spouse name: Not on file   Number of children: Not on file   Years of education: Not on file   Highest education level: Not on file  Occupational History   Not on file  Tobacco Use   Smoking status: Never   Smokeless tobacco: Never  Vaping Use   Vaping Use: Never used  Substance and Sexual Activity   Alcohol use: Not Currently   Drug use: Never   Sexual activity: Not on file  Other Topics Concern   Not on file  Social History Narrative   Not on file   Social Determinants of Health   Financial Resource Strain: Not on file  Food Insecurity: Not on file  Transportation Needs: Not on file  Physical Activity: Not on file  Stress: Not on file  Social Connections: Not on file   Past Surgical History:  Procedure Laterality Date   APPENDECTOMY  1964   BOTOX INJECTION N/A 02/28/2020   Procedure: CYSTOSCOPY BOTOX 100 UNITS INJECTION;  Surgeon: Alfredo Martinez, MD;  Location: [facility];  Service: Urology;  Laterality: N/A;   CERVICAL FUSION  2000   c 5 to c 7    EYE SURGERY Bilateral 1962   eye muscle sx   INSERTION OF SUPRAPUBIC CATHETER N/A 02/28/2020   Procedure: INSERTION OF SUPRAPUBIC CATHETER;  Surgeon: Alfredo Martinez, MD;  Location: [facility];  Service: Urology;  Laterality: N/A;   KNEE SURGERY Right    LUMBAR SPINE SURGERY  10/16/2019   L1 burst fracture with epidural hematoma and canal stenosis   OPEN REDUCTION INTERNAL FIXATION (ORIF) TIBIA/FIBULA FRACTURE Left 10/19/2019   Procedure: OPEN REDUCTION INTERNAL FIXATION (ORIF) TIBIA/FIBULA FRACTURE;  Surgeon: Roby Lofts, MD;  Location: [facility];  Service: Orthopedics;  Laterality: Left;   SHOULDER ARTHROSCOPY WITH BANKART REPAIR Left 10/19/2019   Procedure: SHOULDER ARTHROSCOPY WITH BANKART  REPAIR, EXTENSIVE DEBRIDEMENT, LOOSE BODY REMOVAL;  Surgeon: Bjorn Pippin, MD;  Location: [facility];  Service: Orthopedics;  Laterality: Left;   STRABISMUS SURGERY     TONSILLECTOMY     Past Surgical History:  Procedure Laterality Date   APPENDECTOMY  1964   BOTOX INJECTION N/A 02/28/2020   Procedure: CYSTOSCOPY BOTOX 100 UNITS INJECTION;  Surgeon: Alfredo Martinez, MD;  Location: [facility];  Service: Urology;  Laterality: N/A;   CERVICAL FUSION  2000   c 5 to c 7    EYE SURGERY Bilateral 1962   eye muscle sx   INSERTION OF SUPRAPUBIC CATHETER N/A 02/28/2020   Procedure: INSERTION OF SUPRAPUBIC CATHETER;  Surgeon: Alfredo Martinez, MD;  Location: [facility];  Service: Urology;  Laterality: N/A;   KNEE SURGERY Right    LUMBAR SPINE SURGERY  10/16/2019   L1 burst fracture with epidural hematoma and canal stenosis   OPEN REDUCTION INTERNAL FIXATION (ORIF) TIBIA/FIBULA FRACTURE Left 10/19/2019   Procedure: OPEN REDUCTION INTERNAL FIXATION (ORIF) TIBIA/FIBULA FRACTURE;  Surgeon: Roby Lofts, MD;  [facility];  Service: Orthopedics;  Laterality: Left;   SHOULDER ARTHROSCOPY WITH BANKART REPAIR Left 10/19/2019   Procedure: SHOULDER ARTHROSCOPY WITH BANKART REPAIR, EXTENSIVE DEBRIDEMENT, LOOSE BODY REMOVAL;  Surgeon: Bjorn Pippin, MD;  Location: [facility];  Service: Orthopedics;  Laterality: Left;   STRABISMUS SURGERY     TONSILLECTOMY     Past Medical History:  Diagnosis Date   Anemia    Arthritis    Bundle branch block, right    Closed left ankle fracture    Complication of anesthesia    Compression fracture of lumbar vertebrae, non-traumatic (HCC)    Foley catheter in place    last changed 2 weeks ago   GERD (gastroesophageal reflux disease)    Hand pain    mild OA bilateral  hands   Left knee injury 1976   requiring surgery for repair of ligaments/cartilage damage with nerve injury that took 20 years to resolve   MVP (mitral valve prolapse)     mild no cardiologist   Neurogenic bladder    Neurogenic bowel    PONV (postoperative nausea and vomiting)    Spinal cord injury at T7-T12 level (HCC) 10/15/2019   due to accident 10-15-2019 fell 18 feet on concrete   Ht 6' (1.829 m)   BMI 17.33 kg/m   Opioid Risk Score:   Fall Risk Score:  `1  Depression screen PHQ 2/9  Depression screen [facility] 2/9 05/29/2021 09/10/2020 07/30/2020 11/14/2019  Decreased Interest 1 1 1  0  Down, Depressed, Hopeless 1 1 1 2   PHQ - 2 Score 2 2 2 2   Altered  sleeping - - - 1  Tired, decreased energy - - - 1  Change in appetite - - - 0  Feeling bad or failure about yourself  - - - 3  Trouble concentrating - - - 1  Moving slowly or fidgety/restless - - - 3  Suicidal thoughts - - - 0  PHQ-9 Score - - - 11  Difficult doing work/chores - - - Somewhat difficult    Review of Systems  Musculoskeletal:        Pain from the waist down, and down to the left foot  All other systems reviewed and are negative.     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     So, would talk Urology- but would also try pump/ring. In addition to Viagra. Also discussed implants- can be possible, but try external pump before internal surgery.   2.  Will refill the Nucynta at 100 mg QID prn- usually takes 2x/day, but can take up to 4x/day.   3. Will refill Methadone  5 mg tabs- 10 mg 2x/day- for chronic pain.  Will try taking the actual 10 mg, not 5 mg and see how it goes.    4. Discussed that there are no new nerve pain meds since last seen but there are some in research right now  I've heard about.   5. F/U in 43months Double appointment   I spent a total of 31 minutes on visit- as detailed above- specifically discussing erectile issues.

## 2021-08-26 NOTE — Patient Instructions (Signed)
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     So, would talk Urology- but would also try pump/ring. In addition to Viagra. Also discussed implants- can be possible, but try external pump before internal surgery.   2.  Will refill the Nucynta at 100 mg QID prn- usually takes 2x/day, but can take up to 4x/day.   3. Will refill Methadone  5 mg tabs- 10 mg 2x/day- for chronic pain.  Will try taking the actual 10 mg, not 5 mg and see how it goes.    4. Discussed that there are no new nerve pain meds since last seen but there are some in research right now I've heard about.   5. F/U in 64months Double appointment

## 2021-09-07 ENCOUNTER — Other Ambulatory Visit: Payer: Self-pay | Admitting: Physical Medicine and Rehabilitation

## 2021-09-19 ENCOUNTER — Other Ambulatory Visit: Payer: Self-pay | Admitting: Physical Medicine and Rehabilitation

## 2021-10-08 ENCOUNTER — Other Ambulatory Visit: Payer: Self-pay | Admitting: Physical Medicine and Rehabilitation

## 2021-10-09 NOTE — Telephone Encounter (Signed)
Attempted to send refill of diclofenac but was unable due to medication warning

## 2021-10-21 ENCOUNTER — Other Ambulatory Visit: Payer: Self-pay | Admitting: Physical Medicine and Rehabilitation

## 2021-10-21 NOTE — Telephone Encounter (Signed)
Refill for Baclofen sent from pharmacy. Would you like to refill?

## 2021-10-30 ENCOUNTER — Other Ambulatory Visit: Payer: Self-pay

## 2021-10-30 MED ORDER — METHADONE HCL 5 MG PO TABS: 10.0000 mg | ORAL_TABLET | Freq: Two times a day (BID) | ORAL | 0 refills | Status: DC

## 2021-10-30 MED ORDER — TAPENTADOL HCL 100 MG PO TABS: 100.0000 mg | ORAL_TABLET | Freq: Four times a day (QID) | ORAL | 0 refills | Status: DC | PRN

## 2021-10-30 NOTE — Telephone Encounter (Signed)
Mr. Goeden is out of his pain medication on tomorrow.  Please send  refills.   PMP: Filled  Written  ID  Drug  QTY  Days  Prescriber  RX #  Dispenser  Refill  Daily Dose*  Pymt Type  PMP  08/28/2021 08/26/2021 1  Nucynta 100 Mg Tablet 120.00 30 Me Lov [id number] [facility] ([id number]) 0/0 160.00 [facility] [employer] [place] 08/26/2021 08/26/2021 1  Methadone Hcl 5 Mg Tablet 120.00 30 Me Lov [id number] [facility] ([id number]) 0/0 60.00 MME [employer] [place]

## 2021-10-31 MED ORDER — METHADONE HCL 5 MG PO TABS: 10.0000 mg | ORAL_TABLET | Freq: Two times a day (BID) | ORAL | 0 refills | Status: DC

## 2021-10-31 MED ORDER — TAPENTADOL HCL 100 MG PO TABS: 100.0000 mg | ORAL_TABLET | Freq: Four times a day (QID) | ORAL | 0 refills | Status: DC | PRN

## 2021-11-25 ENCOUNTER — Encounter
Payer: Medicare Other | Attending: Physical Medicine and Rehabilitation | Admitting: Physical Medicine and Rehabilitation

## 2021-11-25 ENCOUNTER — Encounter: Payer: Self-pay | Admitting: Physical Medicine and Rehabilitation

## 2021-11-25 ENCOUNTER — Other Ambulatory Visit: Payer: Self-pay

## 2021-11-25 VITALS — BP 145/84 | HR 55 | Temp 98.6°F | Ht 72.0 in | Wt 143.0 lb

## 2021-11-25 DIAGNOSIS — G894 Chronic pain syndrome: Secondary | ICD-10-CM | POA: Diagnosis not present

## 2021-11-25 DIAGNOSIS — N319 Neuromuscular dysfunction of bladder, unspecified: Secondary | ICD-10-CM | POA: Diagnosis not present

## 2021-11-25 DIAGNOSIS — S343XXS Injury of cauda equina, sequela: Secondary | ICD-10-CM | POA: Insufficient documentation

## 2021-11-25 MED ORDER — GABAPENTIN 600 MG PO TABS: ORAL_TABLET | ORAL | 3 refills | Status: DC

## 2021-11-25 MED ORDER — TRAZODONE HCL 50 MG PO TABS: ORAL_TABLET | ORAL | 3 refills | Status: DC

## 2021-11-25 MED ORDER — TAPENTADOL HCL 100 MG PO TABS: 100.0000 mg | ORAL_TABLET | Freq: Four times a day (QID) | ORAL | 0 refills | Status: DC | PRN

## 2021-11-25 MED ORDER — METHADONE HCL 5 MG PO TABS: 10.0000 mg | ORAL_TABLET | Freq: Two times a day (BID) | ORAL | 0 refills | Status: DC

## 2021-11-25 MED ORDER — BACLOFEN 5 MG PO TABS: 1.0000 | ORAL_TABLET | Freq: Three times a day (TID) | ORAL | 3 refills | Status: DC

## 2021-11-25 MED ORDER — SILDENAFIL CITRATE 100 MG PO TABS: 100.0000 mg | ORAL_TABLET | ORAL | 11 refills | Status: AC | PRN

## 2021-11-25 NOTE — Patient Instructions (Signed)
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     Has 1 year refills on Cyproheptadine so doesn't need refills- has 90 days supply- 2.  Will change Baclofen to 5 mg TID with 90 days supply- 3 refills.   3. Con't Diclofenac and phenergan as needed- has refills  4. Change gabapentin to 600 mg TID with 3 months supply 3 refills.   5. Change Trazodone to 90 days supply 50 mg QHS 3 refills.    6. Will refill pain meds Nucynta 100 mg q6 ours prn #120  and Methadone- 10 mg BID (5 mg tabs) #120- sent in a little early, since I'm off later in week.   7. Went through med list and d/c;d meds not taking.     8. Zoloft from Dr Alben Spittle- is on 150 mg daily from PCP.   9. Viagra 100 mg  as needed for intimacy- #10 11 refills- if need be, I can transfer to another pharmacy- I've had pts get at [facility] and [facility] at less expensive price- <$20.   10. F/U in 3 months for f/u on chronic pain

## 2021-11-25 NOTE — Progress Notes (Signed)
Subjective:    Patient ID: Louis Jordan, male    DOB: November 30, 1956, 65 y.o.   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    Pt has seen Dr Roderic Ovens for possible intrathecal pain pump- Did a trial 10/20 of fentanyl- itching real bad.  50% improvement in pain-  A matter of getting the right medicine.  He plans on getting the pain pump- within 15 minutes, it was great- then had itching reaction.  Within 1 hour, was back to baseline- but just got injection.    Last got pain meds fill 11/18- so can refill today to reduce calls.   Had cystoscopy on 11/2-  Removed some scar tissue- was totally blocked- 3 areas with a lot of scar tissue. Was able to fix 2 of 3- near bladder, tried to open up more- going in monthly to keep it open.   Bladder- feels urge to pee; sometimes can pee- but doesn't completely empty bladder- sometimes can drain completely, but not always.  Has to strain super hard and sometimes cannot completely empty- not working right now.   Bowels- going OK- has to play with meds- but no severe constipation lately.   Goes for 1/4 to look at small intestine to  do small camera to look for source of bleeding- has to also get another colonoscopy in first 3 months of 2023.   GI- stopped Creon for a little while- but then restarted due to uncontrollable diarrhea.   Also found to have ascending aortic aneurysm- is stable- but also might have bicuspid valve- couldn't be confirmed on ECHO.   Since last visit in September, has gained 8 lbs!  Still having a lot of pain- Down on Methadone to 5 mg BID-  Pain is really bad if doesn't take methadone.  Nucynta- 1 tab 2x/day- was doing 3x/day, but was out of it in evening.     Pain Inventory Average Pain  7 Pain Right Now 7 My pain is burning, aching, and throbbing  LOCATION OF PAIN  buttocks, groin  BOWEL Number of stools per week: 7-10  Oral laxative use Yes  Type of laxative senna   BLADDER Suprapubic    Mobility walk without assistance how many minutes can you walk? 10-15 ability to climb steps?  yes Do you have any goals in this area?  no  Function retired I need assistance with the following:  meal prep and household duties Do you have any goals in this area?  no  Neuro/Psych weakness numbness tingling  Prior Studies Any changes since last visit?  no  Physicians involved in your care Any changes since last visit?  no   Family History  Problem Relation Age of Onset   Heart disease Mother    Alzheimer's disease Mother    Pancreatic cancer Father    High blood pressure Brother    Social History   Socioeconomic History   Marital status: Married    Spouse name: Not on file   Number of children: Not on file   Years of education: Not on file   Highest education level: Not on file  Occupational History   Not on file  Tobacco Use   Smoking status: Never   Smokeless tobacco: Never  Vaping Use   Vaping Use: Never used  Substance and Sexual Activity   Alcohol use: Not Currently   Drug use: Never   Sexual activity: Not on file  Other Topics Concern   Not on file  Social History Narrative   Not on file   Social Determinants of Health   Financial Resource Strain: Not on file  Food Insecurity: Not on file  Transportation Needs: Not on file  Physical Activity: Not on file  Stress: Not on file  Social Connections: Not on file   Past Surgical History:  Procedure Laterality Date   APPENDECTOMY  1964   BOTOX INJECTION N/A 02/28/2020   Procedure: CYSTOSCOPY BOTOX 100 UNITS INJECTION;  Surgeon: Alfredo Martinez, MD;  Location: [facility];  Service: Urology;  Laterality: N/A;   CERVICAL FUSION  2000   c 5 to c 7    EYE SURGERY  Bilateral 1962   eye muscle sx   INSERTION OF SUPRAPUBIC CATHETER N/A 02/28/2020   Procedure: INSERTION OF SUPRAPUBIC CATHETER;  Surgeon: Alfredo Martinez, MD;  Location: [facility] ;  Service: Urology;  Laterality: N/A;   KNEE SURGERY Right    LUMBAR SPINE SURGERY  10/16/2019   L1 burst fracture with epidural hematoma and canal stenosis   OPEN REDUCTION INTERNAL FIXATION (ORIF) TIBIA/FIBULA FRACTURE Left 10/19/2019   Procedure: OPEN REDUCTION INTERNAL FIXATION (ORIF) TIBIA/FIBULA FRACTURE;  Surgeon: Roby Lofts, MD;  Location: [facility];  Service: Orthopedics;  Laterality: Left;   PROSTATE SURGERY N/A 06/24/2021   Dr. Cleatrice Burke at [facility] ARTHROSCOPY WITH BANKART REPAIR Left 10/19/2019   Procedure: SHOULDER ARTHROSCOPY WITH BANKART REPAIR, EXTENSIVE DEBRIDEMENT, LOOSE BODY REMOVAL;  Surgeon: Bjorn Pippin, MD;  Location: [facility];  Service: Orthopedics;  Laterality: Left;   STRABISMUS SURGERY     TONSILLECTOMY     Past Medical History:  Diagnosis Date   Anemia    Arthritis    Bundle branch block, right    Closed left ankle fracture    Complication of anesthesia    Compression fracture of lumbar vertebrae, non-traumatic (HCC)    Foley catheter in place    last changed 2 weeks ago   GERD (gastroesophageal reflux disease)    Hand pain    mild OA bilateral  hands   Left knee injury 1976   requiring surgery for repair of ligaments/cartilage damage with nerve injury that took 20 years to resolve   MVP (mitral valve prolapse)    mild no cardiologist   Neurogenic bladder    Neurogenic bowel    PONV (postoperative nausea and vomiting)    Spinal cord injury at T7-T12 level (HCC) 10/15/2019   due to accident 10-15-2019 fell 18 feet on concrete   BP (!) 145/84   Pulse (!) 55   Temp 98.6 F (37 C)   Ht 6' (1.829 m)   Wt 143 lb (64.9 kg)   SpO2 94%   BMI 19.39 kg/m   Opioid Risk Score:   Fall Risk Score:  `1  Depression screen PHQ 2/9  Depression screen  [facility] 2/9 11/25/2021 08/26/2021 05/29/2021 09/10/2020 07/30/2020 11/14/2019  Decreased Interest 0 1 1 1 1  0  Down, Depressed, Hopeless 0 1 1 1 1 2   PHQ - 2 Score 0 2 2 2 2 2   Altered sleeping - - - - - 1  Tired, decreased energy - - - - - 1  Change in appetite - - - - - 0  Feeling  bad or failure about yourself  - - - - - 3  Trouble concentrating - - - - - 1  Moving slowly or fidgety/restless - - - - - 3  Suicidal thoughts - - - - - 0  PHQ-9 Score - - - - - 11  Difficult doing work/chores - - - - - Somewhat difficult      Review of Systems  Constitutional: Negative.   HENT: Negative.    Eyes: Negative.   Respiratory: Negative.    Cardiovascular: Negative.   Gastrointestinal: Negative.   Endocrine: Negative.   Genitourinary: Negative.   Musculoskeletal: Negative.   Skin: Negative.   Allergic/Immunologic: Negative.   Neurological:  Positive for weakness.  Hematological: Negative.   Psychiatric/Behavioral: Negative.        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     Has 1 year refills on Cyproheptadine so doesn't need refills- has 90 days supply- 2.  Will change Baclofen to 5 mg TID with 90 days supply- 3 refills.   3. Con't Diclofenac and phenergan as needed- has refills  4. Change gabapentin to 600 mg TID with 3 months supply 3 refills.   5. Change Trazodone to 90 days supply 50 mg QHS 3 refills.    6. Will refill pain meds Nucynta 100 mg q6 ours prn #120  and Methadone- 10 mg BID (5 mg tabs) #120- sent in a little early, since I'm off later in week.   7. Went through med list  and d/c;d meds not taking.     8. Zoloft from Dr Alben Spittle- is on 150 mg daily from PCP.   9. Viagra 100 mg  as needed for intimacy- #10 11 refills- if need be, I can transfer to another pharmacy- I've had pts get at [facility] and [facility] at less expensive price- <$20.   10. F/U in 3 months for f/u on chronic pain   I spent a total of 31 minutes on visit- as detailed above- discussed pain pump- and overall pain control.

## 2021-12-11 ENCOUNTER — Other Ambulatory Visit: Payer: Self-pay | Admitting: Physical Medicine and Rehabilitation

## 2022-01-17 ENCOUNTER — Other Ambulatory Visit: Payer: Self-pay | Admitting: Physical Medicine and Rehabilitation

## 2022-02-08 ENCOUNTER — Other Ambulatory Visit: Payer: Self-pay | Admitting: Physical Medicine and Rehabilitation

## 2022-02-17 ENCOUNTER — Encounter: Payer: Self-pay | Admitting: Physical Medicine and Rehabilitation

## 2022-02-19 MED ORDER — METHADONE HCL 5 MG PO TABS: 10.0000 mg | ORAL_TABLET | Freq: Two times a day (BID) | ORAL | 0 refills | Status: DC

## 2022-02-26 ENCOUNTER — Encounter: Payer: Self-pay | Admitting: Physical Medicine and Rehabilitation

## 2022-02-26 ENCOUNTER — Other Ambulatory Visit: Payer: Self-pay

## 2022-02-26 ENCOUNTER — Encounter
Payer: Medicare Other | Attending: Physical Medicine and Rehabilitation | Admitting: Physical Medicine and Rehabilitation

## 2022-02-26 VITALS — BP 126/63 | HR 54 | Ht 72.0 in | Wt 143.0 lb

## 2022-02-26 DIAGNOSIS — S343XXS Injury of cauda equina, sequela: Secondary | ICD-10-CM | POA: Diagnosis not present

## 2022-02-26 DIAGNOSIS — F32A Depression, unspecified: Secondary | ICD-10-CM | POA: Insufficient documentation

## 2022-02-26 DIAGNOSIS — N319 Neuromuscular dysfunction of bladder, unspecified: Secondary | ICD-10-CM

## 2022-02-26 DIAGNOSIS — R112 Nausea with vomiting, unspecified: Secondary | ICD-10-CM

## 2022-02-26 DIAGNOSIS — W11XXXS Fall on and from ladder, sequela: Secondary | ICD-10-CM | POA: Diagnosis not present

## 2022-02-26 DIAGNOSIS — Z79899 Other long term (current) drug therapy: Secondary | ICD-10-CM | POA: Insufficient documentation

## 2022-02-26 DIAGNOSIS — Z981 Arthrodesis status: Secondary | ICD-10-CM

## 2022-02-26 DIAGNOSIS — G894 Chronic pain syndrome: Secondary | ICD-10-CM

## 2022-02-26 MED ORDER — PROMETHAZINE HCL 12.5 MG PO TABS: ORAL_TABLET | ORAL | 5 refills | Status: DC

## 2022-02-26 MED ORDER — DICLOFENAC SODIUM 75 MG PO TBEC: DELAYED_RELEASE_TABLET | ORAL | 5 refills | Status: DC

## 2022-02-26 NOTE — Patient Instructions (Signed)
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  ? ?Con't Methadone using 10 mg BID (usually taking 5 mg TID right now), but can always try breaking 1 of tabs in half so 7.5 mg of 2 of doses.  Wait on refill of Nucynta- since so sedating. Was foggy and poor memory.  ? ? ?2. Suggest trying Ketamine- see if Dr Mechele Dawley has a reason to use/not use.  ? ? ?3.  Can continue Diclofenac 75 mg BID prn- 5 refills ? ? ?4. Refill phenergan  12.5 mg 3x/day as needed 5 refills ? ?5. Con't Gabapentin 600 mg  TID- sent in refills last month.  ? ? ?6.  Con't trazodone 50 mg QHS for sleep- - can try AS NEEDED if needs it- can put next to bed and if take if need be- to avoid hangover.  ? ? ?7. Con't Viagra as needed ? ? ?8. Con't Periactin/Cyproheptadine- for appetite stimulant.  ?Had year's supply as of 7/22.  ? ?9.  PCP writes for Zoloft 150 mg daily- continue ? ?10. Try Gas-x- 80-160 mg up to 4x/day for gas- as needed.  ? ?11. F/u in 3 months- double visit ?

## 2022-02-26 NOTE — Progress Notes (Signed)
? ?Subjective:  ? ? Patient ID: Louis Jordan, male    DOB: [birth year], 66 y.o.   MRN: [id number] ? 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  ? ?Still taking Periactin- at 143 lbs-which was at in 12/22- but is stable.  ? ? ?Took Nucynta out and taking Methadone 3 tabs/day ?Not so foggy, but pain worse-  ? ?Methadone makes him real sleepy- as well- but a little better as day goes on-  ? Going to try and deal with pain for a little ?On 2nd week of trying that.  ? ?CBD doesn't make a difference.  ? ?Sees Dr Mechele Dawley next week- to try pain pump again.  ?Had terrible itching- tried Dilaudid he thinks, but doing Fentanyl next time.  Not clear which one- will let me know.  ? ?Dr Mechele Dawley not on Epic so cannot look up his notes.  ? ? ?No other specific issues- trying to  ? ?Still has SPC in place- Urology still thinks bladder will work at some point-  ?Can empty bladder if sits down and pushes hard and if bladder real full, can void a little bit standing.  ?Plans to go to [facility]- stimulator to help voiding. Hasn't done referral yet. Sees regular Urology in early May 2023.  ? ?Sleeps well at night- 8-9 hours/night.  ? ? ?Still having more nausea- around 1-3 pm- after lunch meds- usually take phenergan 12.5 mg 5 days/week. ? ?Working out at gym in apartment complex 4 days/week- ?For last 6 weeks. Hasn't missed for that period of time.  ?Also stretching.  ? ?Bowels- still taking miralax  3x/week and Senokot 1 tab/day ?And colace QOD- has to work to have BM's.  ?Still passing gas- ?No bowel accidents-  ? ? ?Pain Inventory ?Average Pain 8 ?Pain Right Now 8 ?My pain is burning, tingling, and numbness ? ?LOCATION OF PAIN  back, buttocks, groin, leg ? ?BOWEL ?Number of stools per week: 7 ?Oral laxative use Yes  ?Type of  laxative senakot, colace ?Enema or suppository use No  ?History of colostomy No  ?Incontinent No  ? ?BLADDER ?Suprapubic ?In and out cath, frequency . ?Able to self cath  . ?Bladder incontinence No  ?Frequent urination No  ?Leakage with coughing No  ?Difficulty starting stream No  ?Incomplete bladder emptying No  ? ? ?Mobility ?walk without assistance ?ability to climb steps?  yes ?do you drive?  yes ? ?Function ?retired ? ?Neuro/Psych ?bladder control problems ?numbness ?tingling ? ?Prior Studies ?Any changes since last visit?  no ? ?Physicians involved in your care ?Any changes since last visit?  no ? ? ?Family History  ?Problem Relation Age of Onset  ? Heart disease Mother   ? Alzheimer's disease Mother   ? Pancreatic cancer Father   ? High blood pressure Brother   ? ?Social History  ? ?Socioeconomic History  ? Marital status: Married  ?  Spouse name: Not on file  ? Number of children: Not on file  ? Years of education: Not on file  ? Highest education level: Not on file  ?Occupational History  ? Not on file  ?Tobacco Use  ? Smoking status: Never  ? Smokeless tobacco: Never  ?Vaping Use  ? Vaping Use: Never used  ?Substance and Sexual  Activity  ? Alcohol use: Not Currently  ? Drug use: Never  ? Sexual activity: Not on file  ?Other Topics Concern  ? Not on file  ?Social History Narrative  ? Not on file  ? ?Social Determinants of Health  ? ?Financial Resource Strain: Not on file  ?Food Insecurity: Not on file  ?Transportation Needs: Not on file  ?Physical Activity: Not on file  ?Stress: Not on file  ?Social Connections: Not on file  ? ?Past Surgical History:  ?Procedure Laterality Date  ? APPENDECTOMY  1964  ? BOTOX INJECTION N/A 02/28/2020  ? Procedure: CYSTOSCOPY BOTOX 100 UNITS INJECTION;  Surgeon: Bjorn Loser, MD;  Location: [facility];  Service: Urology;  Laterality: N/A;  ? CERVICAL FUSION  2000  ? c 5 to c 7   ? EYE SURGERY Bilateral 1962  ? eye muscle sx  ? INSERTION OF SUPRAPUBIC  CATHETER N/A 02/28/2020  ? Procedure: INSERTION OF SUPRAPUBIC CATHETER;  Surgeon: Bjorn Loser, MD;  Location: [facility];  Service: Urology;  Laterality: N/A;  ? KNEE SURGERY Right   ? LUMBAR SPINE SURGERY  10/16/2019  ? L1 burst fracture with epidural hematoma and canal stenosis  ? OPEN REDUCTION INTERNAL FIXATION (ORIF) TIBIA/FIBULA FRACTURE Left 10/19/2019  ? Procedure: OPEN REDUCTION INTERNAL FIXATION (ORIF) TIBIA/FIBULA FRACTURE;  Surgeon: Shona Needles, MD;  Location: [place];  Service: Orthopedics;  Laterality: Left;  ? PROSTATE SURGERY N/A 06/24/2021  ? Dr. Thomasene Mohair at [facility]  ? SHOULDER ARTHROSCOPY WITH BANKART REPAIR Left 10/19/2019  ? Procedure: SHOULDER ARTHROSCOPY WITH BANKART REPAIR, EXTENSIVE DEBRIDEMENT, LOOSE BODY REMOVAL;  Surgeon: Hiram Gash, MD;  Location: [place];  Service: Orthopedics;  Laterality: Left;  ? STRABISMUS SURGERY    ? TONSILLECTOMY    ? ?Past Medical History:  ?Diagnosis Date  ? Anemia   ? Arthritis   ? Bundle branch block, right   ? Closed left ankle fracture   ? Complication of anesthesia   ? Compression fracture of lumbar vertebrae, non-traumatic ([facility])   ? Foley catheter in place   ? last changed 2 weeks ago  ? GERD (gastroesophageal reflux disease)   ? Hand pain   ? mild OA bilateral  hands  ? Left knee injury 1976  ? requiring surgery for repair of ligaments/cartilage damage with nerve injury that took 20 years to resolve  ? MVP (mitral valve prolapse)   ? mild no cardiologist  ? Neurogenic bladder   ? Neurogenic bowel   ? PONV (postoperative nausea and vomiting)   ? Spinal cord injury at T7-T12 level ([facility]) 10/15/2019  ? due to accident 10-15-2019 fell 18 feet on concrete  ? ?BP 126/63   Pulse (!) 54   Ht 6' (1.829 m)   Wt 143 lb (64.9 kg)   SpO2 97%   BMI 19.39 kg/m?  ? ?Opioid Risk Score:   ?Fall Risk Score:  `1 ? ?Depression screen PHQ 2/9 ? ?Depression screen [facility] 2/9 11/25/2021 08/26/2021 05/29/2021 09/10/2020 07/30/2020 11/14/2019  ?Decreased  Interest 0 1 1 1 1  0  ?Down, Depressed, Hopeless 0 1 1 1 1 2   ?PHQ - 2 Score 0 2 2 2 2 2   ?Altered sleeping - - - - - 1  ?Tired, decreased energy - - - - - 1  ?Change in appetite - - - - - 0  ?Feeling bad or failure about yourself  - - - - - 3  ?Trouble concentrating - - - - -  1  ?Moving slowly or fidgety/restless - - - - - 3  ?Suicidal thoughts - - - - - 0  ?PHQ-9 Score - - - - - 11  ?Difficult doing work/chores - - - - - Somewhat difficult  ?  ? ?Review of Systems ?An entire review of systems completed- negative except for HPI.  ?   ?Objective:  ? Physical Exam ? ? ?Awake, alert, appropriate, slightly depressed affect; walking without assistive device; accompanied by wife, NAD ?SPC in place ?   ?Assessment & Plan:  ? 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  ? ?Con't Methadone using 10 mg BID (usually taking 5 mg TID right now), but can always try breaking 1 of tabs in half so 7.5 mg of 2 of doses.  Wait on refill of Nucynta- since so sedating. Was foggy and poor memory.  ? ? ?2. Suggest trying Ketamine- see if Dr Mechele Dawley has a reason to use/not use.  ? ? ?3.  Can continue Diclofenac 75 mg BID prn- 5 refills ? ? ?4. Refill phenergan  12.5 mg 3x/day as needed 5 refills ? ?5. Con't Gabapentin 600 mg  TID- sent in refills last month.  ? ? ?6.  Con't trazodone 50 mg QHS for sleep- - can try AS NEEDED if needs it- can put next to bed and if take if need be- to avoid hangover.  ? ? ?7. Con't Viagra as needed ? ? ?8. Con't Periactin/Cyproheptadine- for appetite stimulant.  ?Had year's supply as of 7/22.  ? ?9.  PCP writes for Zoloft 150 mg daily- continue ? ?10. Try Gas-x- 80-160 mg up to 4x/day for gas- as needed.  ? ?11. F/u in 3 months- double visit ? ? ?I spent a total of  31  minutes on total care today-  >50% coordination of care- due to d/w pt about pain options and ketamine.  ? ?

## 2022-04-04 ENCOUNTER — Encounter: Payer: Self-pay | Admitting: Physical Medicine and Rehabilitation

## 2022-04-04 MED ORDER — METHADONE HCL 5 MG PO TABS: 10.0000 mg | ORAL_TABLET | Freq: Two times a day (BID) | ORAL | 0 refills | Status: DC

## 2022-05-09 ENCOUNTER — Encounter: Payer: Self-pay | Admitting: Physical Medicine and Rehabilitation

## 2022-05-09 MED ORDER — METHADONE HCL 5 MG PO TABS: 10.0000 mg | ORAL_TABLET | Freq: Two times a day (BID) | ORAL | 0 refills | Status: DC

## 2022-05-16 ENCOUNTER — Encounter
Payer: Medicare Other | Attending: Physical Medicine and Rehabilitation | Admitting: Physical Medicine and Rehabilitation

## 2022-05-16 ENCOUNTER — Encounter: Payer: Self-pay | Admitting: Physical Medicine and Rehabilitation

## 2022-05-16 VITALS — BP 112/70 | HR 58 | Ht 72.0 in | Wt 137.6 lb

## 2022-05-16 DIAGNOSIS — S343XXS Injury of cauda equina, sequela: Secondary | ICD-10-CM | POA: Diagnosis present

## 2022-05-16 DIAGNOSIS — G894 Chronic pain syndrome: Secondary | ICD-10-CM | POA: Insufficient documentation

## 2022-05-16 DIAGNOSIS — Z79891 Long term (current) use of opiate analgesic: Secondary | ICD-10-CM | POA: Diagnosis present

## 2022-05-16 DIAGNOSIS — M62579 Muscle wasting and atrophy, not elsewhere classified, unspecified ankle and foot: Secondary | ICD-10-CM | POA: Diagnosis present

## 2022-05-16 DIAGNOSIS — Z5181 Encounter for therapeutic drug level monitoring: Secondary | ICD-10-CM | POA: Insufficient documentation

## 2022-05-16 DIAGNOSIS — M792 Neuralgia and neuritis, unspecified: Secondary | ICD-10-CM | POA: Insufficient documentation

## 2022-05-16 MED ORDER — METHADONE HCL 5 MG PO TABS: 5.0000 mg | ORAL_TABLET | Freq: Three times a day (TID) | ORAL | 0 refills | Status: DC

## 2022-05-16 NOTE — Patient Instructions (Addendum)
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 Also impaired balance due to foot atrophy  Gas-X take 1-2 tabs up to 4x/day for gas.  Carbonation can make gas worse. Drinks diet colas.   2.  Outpt PT for impaired balance due to weakness/cauda equina syndrome. Written Rx given- want to go to Rehab without [facility]-   3. Con't Methadone- taking 5 mg TID- so will change Rx and refill today for June- sent in Rx after fill 6/23  4. Not taking Nucynta due to sedation.   5. Discuss Pain pump with Dr Mechele Dawley please! Don't see other options right now.    6. Con't Phenergan-   7. Discussed ways to gain weight- try ice cream shakes- discussed how to do this with protein powder as well - daily.   8. Not using Trazodone for sleep- sleeping well.   9. Con't gabapentin 600 mg TID.   10. F/U in 3 months DOUBLE appt.    11. Can d/w Podiatry about toe curling. Due to intrinsic muscle atrophy- Dr Paulla Dolly  Dr Jacqualyn Posey- [phone]- don't usually need referral-   12. Britta Mccre[facility]- July 27th 6-7 pm Thursday-

## 2022-05-16 NOTE — Progress Notes (Signed)
Subjective:    Patient ID: Louis Jordan, male    DOB: [birth year], 66 y.o.   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   Things stable- no change.  Did see Dr Roderic Ovens about Ketamine- didn't think it would work for him. Didn't think would help his Sx's.  Can go back and discuss pain pump.   Nausea- every day- takes Phenergan- usually around 2pm- after 2nd dose of meds.  Still takes prn, and sometimes can go without.   Have had some good days- have woken up and the day starts well.  Usually has to get up as soon as wakes up- or the day/pain gets worse.    Cannot gain any weight.  No matter what he's using.  Appetite is NOT poor- eats well- all the time- until goes ot bed.  Eats all the time- but cannot gain weight his whole life.  Likes to get back to 160s- is in now 137.8 lbs today - was 165 lbs before accident.   Eats dessert every night- and danish, etc- doesn't like jumk food.   Eats nuts/dairy;  Doesn't like Ensure- uses a whey protein supplement/powder.    Gas- hasn't been using Gas-X- has it at home, but hasn't used  Doesn't take Periactin since eats all the time. Asking about Oxandrolone and anabolic steroids.  Explained cannot get Anabolic steroids due to cost.    Wondering if can go back to therapy for gait-   Hasn't fallen- but is unstable- has had near falls- couple times per week. Where catches self with hands, but really thinks is due to balance.   Has curling of L>R toes.  Really affecting his balance.     Pain Inventory Average Pain 7 Pain Right Now 7 My pain is constant, burning, dull, and aching  In the last 24 hours, has pain interfered with the following? General activity 7 Relation with others 7 Enjoyment of  life 7 What TIME of day is your pain at its worst? morning  Sleep (in general) Good  Pain is worse with: sitting and some activites Pain improves with: heat/ice and medication Relief from Meds: 5  Family History  Problem Relation Age of Onset   Heart disease Mother    Alzheimer's disease Mother    Pancreatic cancer Father    High blood pressure Brother    Social History   Socioeconomic History   Marital status: Married    Spouse name: Not on file   Number of children: Not on file   Years of education: Not on file   Highest education level: Not on file  Occupational History   Not on file  Tobacco Use   Smoking status: Never   Smokeless tobacco: Never  Vaping Use   Vaping Use: Never used  Substance and Sexual Activity   Alcohol use: Not Currently   Drug use: Never   Sexual activity: Not on file  Other Topics Concern   Not on file  Social History Narrative   Not on file   Social Determinants of Health   Financial Resource Strain: Not on file  Food Insecurity: Not on file  Transportation Needs: Not on file  Physical Activity: Not on file  Stress: Not on file  Social Connections: Not on file   Past Surgical History:  Procedure Laterality Date   APPENDECTOMY  1964   BOTOX INJECTION N/A 02/28/2020   Procedure: CYSTOSCOPY BOTOX 100 UNITS INJECTION;  Surgeon: Alfredo Martinez, MD;  Location: [facility] [place];  Service: Urology;  Laterality: N/A;   CERVICAL FUSION  2000   c 5 to c 7    EYE SURGERY Bilateral 1962   eye muscle sx   INSERTION OF SUPRAPUBIC CATHETER N/A 02/28/2020   Procedure: INSERTION OF SUPRAPUBIC CATHETER;  Surgeon: Alfredo Martinez, MD;  Location: [facility] [place];  Service: Urology;  Laterality: N/A;   KNEE SURGERY Right    LUMBAR SPINE SURGERY  10/16/2019   L1 burst fracture with epidural hematoma and canal stenosis   OPEN REDUCTION INTERNAL FIXATION (ORIF) TIBIA/FIBULA FRACTURE Left 10/19/2019   Procedure: OPEN REDUCTION  INTERNAL FIXATION (ORIF) TIBIA/FIBULA FRACTURE;  Surgeon: Roby Lofts, MD;  Location: [facility];  Service: Orthopedics;  Laterality: Left;   PROSTATE SURGERY N/A 06/24/2021   Dr. Cleatrice Burke at [facility] ARTHROSCOPY WITH BANKART REPAIR Left 10/19/2019   Procedure: SHOULDER ARTHROSCOPY WITH BANKART REPAIR, EXTENSIVE DEBRIDEMENT, LOOSE BODY REMOVAL;  Surgeon: Bjorn Pippin, MD;  Location: [facility];  Service: Orthopedics;  Laterality: Left;   STRABISMUS SURGERY     TONSILLECTOMY     Past Surgical History:  Procedure Laterality Date   APPENDECTOMY  1964   BOTOX INJECTION N/A 02/28/2020   Procedure: CYSTOSCOPY BOTOX 100 UNITS INJECTION;  Surgeon: Alfredo Martinez, MD;  Location: [facility] [place];  Service: Urology;  Laterality: N/A;   CERVICAL FUSION  2000   c 5 to c 7    EYE SURGERY Bilateral 1962   eye muscle sx   INSERTION OF SUPRAPUBIC CATHETER N/A 02/28/2020   Procedure: INSERTION OF SUPRAPUBIC CATHETER;  Surgeon: Alfredo Martinez, MD;  Location: [facility] [place];  Service: Urology;  Laterality: N/A;   KNEE SURGERY Right    LUMBAR SPINE SURGERY  10/16/2019   L1 burst fracture with epidural hematoma and canal stenosis   OPEN REDUCTION INTERNAL FIXATION (ORIF) TIBIA/FIBULA FRACTURE Left 10/19/2019   Procedure: OPEN REDUCTION INTERNAL FIXATION (ORIF) TIBIA/FIBULA FRACTURE;  Surgeon: Roby Lofts, MD;  [facility];  Service: Orthopedics;  Laterality: Left;   PROSTATE SURGERY N/A 06/24/2021   Dr. Cleatrice Burke at [facility] ARTHROSCOPY WITH BANKART REPAIR Left 10/19/2019   Procedure: SHOULDER ARTHROSCOPY WITH BANKART REPAIR, EXTENSIVE DEBRIDEMENT, LOOSE BODY REMOVAL;  Surgeon: Bjorn Pippin, MD;  Location: [facility];  Service: Orthopedics;  Laterality: Left;   STRABISMUS SURGERY     TONSILLECTOMY     Past Medical History:  Diagnosis Date   Anemia    Arthritis    Bundle branch block, right    Closed left ankle fracture    Complication of anesthesia     Compression fracture of lumbar vertebrae, non-traumatic (HCC)    Foley catheter in place    last changed 2 weeks ago   GERD (gastroesophageal reflux disease)    Hand pain    mild OA bilateral  hands   Left knee injury 1976   requiring surgery for repair of ligaments/cartilage damage with nerve injury that took 20 years to resolve   MVP (mitral valve prolapse)    mild no cardiologist   Neurogenic bladder    Neurogenic bowel    PONV (postoperative nausea and vomiting)    Spinal cord injury at T7-T12 level (HCC) 10/15/2019  due to accident 10-15-2019 fell 18 feet on concrete   BP 112/70   Pulse (!) 58   Ht 6' (1.829 m)   Wt 137 lb 9.6 oz (62.4 kg)   SpO2 96%   BMI 18.66 kg/m   Opioid Risk Score:   Fall Risk Score:  `1  Dep[facility] screen [facility] 2/9     05/16/2022    9:40 AM 11/25/2021   10:36 AM 08/26/2021   10:45 AM 05/29/2021   10:49 AM 09/10/2020   11:43 AM 07/30/2020   11:56 AM 11/14/2019    3:21 PM  Depression screen PHQ 2/9  Decreased Interest 0 0 1 1 1 1  0  Down, Depressed, Hopeless  0 1 1 1 1 2   PHQ - 2 Score 0 0 2 2 2 2 2   Altered sleeping       1  Tired, decreased energy       1  Change in appetite       0  Feeling bad or failure about yourself        3  Trouble concentrating       1  Moving slowly or fidgety/restless       3  Suicidal thoughts       0  PHQ-9 Score       11  Difficult doing work/chores       Somewhat difficult      Review of Systems An entire ROS was completed and found to be negative except for HPI.     Objective:   Physical Exam Awake, alert, appropriate, accompanied by wife;   MS:  UE 5/5 B/L- very strong due to weight lifting LE's- RLE_ HF 5/5; KE/5/5; DF 4+/5 PF 5/5; and EHL 2-/5 LLE- HF and KE 5/5; DF 5-/5 PF 5/5 and EHL 4+/5 Cannot stand on toes OR heels B/L   Gait: slightly off balance- almost veered into wall on Left in wide hallway;   Neuro: Almost fell over when put feet together and closed eyes- almost fell over BEFORE  doing provocative testing- couldn't do more testing due to being unsafe  Has atrophy of L>R intrinsics of feet- B/L- as well as severe curling of L>R toes- stuck in curled position now.        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 Also impaired balance due to foot atrophy  Gas-X take 1-2 tabs up to 4x/day for gas.  Carbonation can make gas worse. Drinks diet colas.   2.  Outpt PT for impaired balance due to weakness/cauda equina syndrome. Written Rx given- want to go to Rehab without [facility]-   3. Con't Methadone- taking 5 mg TID- so will change Rx and refill today for June- sent in Rx after fill 6/23  4. Not taking Nucynta due to sedation.   5. Discuss Pain pump with Dr Roderic OvensNorth please! Don't see other options right now.    6. Con't Phenergan-   7. Discussed ways to gain weight- try ice cream shakes- discussed how to do this with protein powder as well - daily.   8. Not using Trazodone for sleep- sleeping well.   9. Con't gabapentin 600 mg TID.   10. F/U in 3 months DOUBLE appt.  11. Can d/w Podiatry about toe curling. Due to intrinsic muscle atrophy- Dr Charlsie Merles  Dr Ardelle Anton- [phone]- don't usually need referral-    I spent a total of  36  minutes on total care today- >50% coordination of care- due to  discussion about weight gain and podiatry and new therapy needs.

## 2022-05-22 LAB — DRUG TOX MONITOR 1 W/CONF, ORAL FLD
Amphetamines: NEGATIVE ng/mL (ref <10)
Barbiturates: NEGATIVE ng/mL (ref <10)
Benzodiazepines: NEGATIVE ng/mL (ref <0.50)
Buprenorphine: NEGATIVE ng/mL (ref <0.10)
Cocaine: NEGATIVE ng/mL (ref <5.0)
EDDP: NEGATIVE ng/mL (ref <5.0)
Fentanyl: NEGATIVE ng/mL (ref <0.10)
Heroin Metabolite: NEGATIVE ng/mL (ref <1.0)
MARIJUANA: NEGATIVE ng/mL (ref <2.5)
MDMA: NEGATIVE ng/mL (ref <10)
Meprobamate: NEGATIVE ng/mL (ref <2.5)
Methadone: 108.6 ng/mL — ABNORMAL HIGH (ref <5.0)
Methadone: POSITIVE ng/mL — AB (ref <5.0)
Nicotine Metabolite: NEGATIVE ng/mL (ref <5.0)
Opiates: NEGATIVE ng/mL (ref <2.5)
Phencyclidine: NEGATIVE ng/mL (ref <10)
Tapentadol: NEGATIVE ng/mL (ref <5.0)
Tramadol: NEGATIVE ng/mL (ref <5.0)
Zolpidem: NEGATIVE ng/mL (ref <5.0)

## 2022-05-22 LAB — DRUG TOX ALC METAB W/CON, ORAL FLD: Alcohol Metabolite: NEGATIVE ng/mL (ref <25)

## 2022-05-23 ENCOUNTER — Telehealth: Payer: Self-pay | Admitting: *Deleted

## 2022-05-23 NOTE — Telephone Encounter (Signed)
Oral swab drug screen was consistent for prescribed medications.  ?

## 2022-06-20 ENCOUNTER — Other Ambulatory Visit: Payer: Self-pay | Admitting: Physical Medicine and Rehabilitation

## 2022-07-23 ENCOUNTER — Encounter: Payer: Self-pay | Admitting: Physical Medicine and Rehabilitation

## 2022-07-24 MED ORDER — METHADONE HCL 5 MG PO TABS: 5.0000 mg | ORAL_TABLET | Freq: Three times a day (TID) | ORAL | 0 refills | Status: DC

## 2022-08-26 MED ORDER — METHADONE HCL 5 MG PO TABS: 5.0000 mg | ORAL_TABLET | Freq: Three times a day (TID) | ORAL | 0 refills | Status: DC

## 2022-08-26 NOTE — Addendum Note (Signed)
Addended by: Genice Rouge on: 08/26/2022 07:21 AM   Modules accepted: Orders

## 2022-09-02 ENCOUNTER — Other Ambulatory Visit: Payer: Self-pay | Admitting: Physical Medicine and Rehabilitation

## 2022-09-08 ENCOUNTER — Encounter: Payer: Self-pay | Admitting: Physical Medicine and Rehabilitation

## 2022-09-08 ENCOUNTER — Encounter
Payer: Medicare Other | Attending: Physical Medicine and Rehabilitation | Admitting: Physical Medicine and Rehabilitation

## 2022-09-08 VITALS — BP 126/77 | HR 51 | Ht 72.0 in | Wt 136.0 lb

## 2022-09-08 DIAGNOSIS — G894 Chronic pain syndrome: Secondary | ICD-10-CM | POA: Insufficient documentation

## 2022-09-08 DIAGNOSIS — Z5181 Encounter for therapeutic drug level monitoring: Secondary | ICD-10-CM | POA: Insufficient documentation

## 2022-09-08 DIAGNOSIS — Z79891 Long term (current) use of opiate analgesic: Secondary | ICD-10-CM | POA: Diagnosis not present

## 2022-09-08 MED ORDER — GABAPENTIN 600 MG PO TABS: 1200.0000 mg | ORAL_TABLET | Freq: Two times a day (BID) | ORAL | 3 refills | Status: DC

## 2022-09-08 MED ORDER — METHADONE HCL 5 MG PO TABS: 5.0000 mg | ORAL_TABLET | Freq: Three times a day (TID) | ORAL | 0 refills | Status: DC

## 2022-09-08 MED ORDER — BACLOFEN 5 MG PO TABS: 1.0000 | ORAL_TABLET | Freq: Three times a day (TID) | ORAL | 3 refills | Status: DC

## 2022-09-08 NOTE — Patient Instructions (Signed)
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 Also impaired balance due to foot atrophy  Con't methadone 5 mg  3x/day- for chronic pain.   2.  Increase gabapentin to 1200 mg 3x/day- do 1200 mg 2x/day x 1 week, then 3x/day. For nerve pain.   3. Con't Zoloft- just refilled 7/23.    4.  Asked about stem cells- we discussed this.    5.  SCI support group- [id number]- 6-7 pm- last Thursday of the month.    6. Con't Baclofen 5 mg 3x/day- for muscle spasms.   7.   F/U in 3 months- double appt- SCI

## 2022-09-08 NOTE — Progress Notes (Signed)
Subjective:    Patient ID: Louis Jordan, male    DOB: 1956-09-18, 66 y.o.   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 Also impaired balance due to foot atrophy   Here for f/u on cauda equina syndrome.   Stable- still hurting.   Gave him injection of stuff that would have been in pain pump- didn't help at all.    Fentanyl and Dilaudid both didn't help. Actually Fentanyl helped but causes fire ant type burning/itching pain.   Doesn't have any other ideas.  Got injections without help.   Was told gabapentin might be causing sphincter of bladder difficult to open.   Can tell that has reduced gabapentin to 600 mg BID instead of TID.   Diclofenac is helping hand OA as well as general arthritis pain.   Got 2 iron infusions- no more energy- levels to be checked Oct 11th.  Due to low iron, concerned about diclofenac. Explained diclofenac can cause bleeding.    Saw Urology cysto this AM- looked good except  sphincter a little tight- dilated 10-20 % and will do again in 2 months.    Podiatry would see him in 1 year- went last month- curling of toes - balance is impeded.   Therapy didn't help 1 bit per pt.  Got tips on stretching, but that's it.     Downsized, so just moved.   Got into back of truck and mat didn't have something on it, and fell flat on back in middle of street- 3 ft- got some abrasions, but no other damage.          Pain Inventory Average Pain 8 Pain Right Now 8 My pain is constant, burning, dull, tingling, and aching  In the last 24 hours, has pain interfered with the following? General activity 8 Relation with others 8 Enjoyment of life 8 What TIME of day is your pain at its worst? morning  Sleep (in general)  Good  Pain is worse with: sitting, inactivity, and some activites Pain improves with: medication Relief from Meds: 6  Family History  Problem Relation Age of Onset   Heart disease Mother    Alzheimer's disease Mother    Pancreatic cancer Father    High blood pressure Brother    Social History   Socioeconomic History   Marital status: Married    Spouse name: Not on file   Number of children: Not on file   Years of education: Not on file   Highest education level: Not on file  Occupational History   Not on file  Tobacco Use   Smoking status: Never   Smokeless tobacco: Never  Vaping Use   Vaping Use: Never used  Substance and Sexual Activity   Alcohol use: Not Currently   Drug use: Never   Sexual activity: Not on file  Other Topics Concern   Not on file  Social History Narrative   Not on file   Social Determinants of Health   Financial Resource Strain: Not on file  Food Insecurity: Not on file  Transportation Needs: Not on file  Physical Activity: Not on file  Stress: Not on file  Social Connections: Not on file   Past Surgical History:  Procedure Laterality Date  APPENDECTOMY  1964   BOTOX INJECTION N/A 02/28/2020   Procedure: CYSTOSCOPY BOTOX 100 UNITS INJECTION;  Surgeon: Alfredo Martinez, MD;  Location: [facility] [place];  Service: Urology;  Laterality: N/A;   CERVICAL FUSION  2000   c 5 to c 7    EYE SURGERY Bilateral 1962   eye muscle sx   INSERTION OF SUPRAPUBIC CATHETER N/A 02/28/2020   Procedure: INSERTION OF SUPRAPUBIC CATHETER;  Surgeon: Alfredo Martinez, MD;  Location: [facility];  Service: Urology;  Laterality: N/A;   KNEE SURGERY Right    LUMBAR SPINE SURGERY  10/16/2019   L1 burst fracture with epidural hematoma and canal stenosis   OPEN REDUCTION INTERNAL FIXATION (ORIF) TIBIA/FIBULA FRACTURE Left 10/19/2019   Procedure: OPEN REDUCTION INTERNAL FIXATION (ORIF) TIBIA/FIBULA FRACTURE;  Surgeon: Roby Lofts, MD;   Location: [facility];  Service: Orthopedics;  Laterality: Left;   PROSTATE SURGERY N/A 06/24/2021   Dr. Cleatrice Burke at [facility] ARTHROSCOPY WITH BANKART REPAIR Left 10/19/2019   Procedure: SHOULDER ARTHROSCOPY WITH BANKART REPAIR, EXTENSIVE DEBRIDEMENT, LOOSE BODY REMOVAL;  Surgeon: Bjorn Pippin, MD;  Location: [facility];  Service: Orthopedics;  Laterality: Left;   STRABISMUS SURGERY     TONSILLECTOMY     Past Surgical History:  Procedure Laterality Date   APPENDECTOMY  1964   BOTOX INJECTION N/A 02/28/2020   Procedure: CYSTOSCOPY BOTOX 100 UNITS INJECTION;  Surgeon: Alfredo Martinez, MD;  Location: [facility];  Service: Urology;  Laterality: N/A;   CERVICAL FUSION  2000   c 5 to c 7    EYE SURGERY Bilateral 1962   eye muscle sx   INSERTION OF SUPRAPUBIC CATHETER N/A 02/28/2020   Procedure: INSERTION OF SUPRAPUBIC CATHETER;  Surgeon: Alfredo Martinez, MD;  Location: [facility];  Service: Urology;  Laterality: N/A;   KNEE SURGERY Right    LUMBAR SPINE SURGERY  10/16/2019   L1 burst fracture with epidural hematoma and canal stenosis   OPEN REDUCTION INTERNAL FIXATION (ORIF) TIBIA/FIBULA FRACTURE Left 10/19/2019   Procedure: OPEN REDUCTION INTERNAL FIXATION (ORIF) TIBIA/FIBULA FRACTURE;  Surgeon: Roby Lofts, MD;  [facility];  Service: Orthopedics;  Laterality: Left;   PROSTATE SURGERY N/A 06/24/2021   Dr. Cleatrice Burke at [facility] ARTHROSCOPY WITH BANKART REPAIR Left 10/19/2019   Procedure: SHOULDER ARTHROSCOPY WITH BANKART REPAIR, EXTENSIVE DEBRIDEMENT, LOOSE BODY REMOVAL;  Surgeon: Bjorn Pippin, MD;  Location: [facility];  Service: Orthopedics;  Laterality: Left;   STRABISMUS SURGERY     TONSILLECTOMY     Past Medical History:  Diagnosis Date   Anemia    Arthritis    Bundle branch block, right    Closed left ankle fracture    Complication of anesthesia    Compression fracture of lumbar vertebrae, non-traumatic (HCC)    Foley catheter in  place    last changed 2 weeks ago   GERD (gastroesophageal reflux disease)    Hand pain    mild OA bilateral  hands   Left knee injury 1976   requiring surgery for repair of ligaments/cartilage damage with nerve injury that took 20 years to resolve   MVP (mitral valve prolapse)    mild no cardiologist   Neurogenic bladder    Neurogenic bowel    PONV (postoperative nausea and vomiting)    Spinal cord injury at T7-T12 level (HCC) 10/15/2019   due to accident 10-15-2019 fell 18 feet on concrete   Ht 6' (1.829 m)  Wt 136 lb (61.7 kg)   BMI 18.44 kg/m   Opioid Risk Score:   Fall Risk Score:  `1  Depression screen [facility] 2/9     05/16/2022    9:40 AM 11/25/2021   10:36 AM 08/26/2021   10:45 AM 05/29/2021   10:49 AM 09/10/2020   11:43 AM 07/30/2020   11:56 AM 11/14/2019    3:21 PM  Depression screen PHQ 2/9  Decreased Interest 0 0 1 1 1 1  0  Down, Depressed, Hopeless  0 1 1 1 1 2   PHQ - 2 Score 0 0 2 2 2 2 2   Altered sleeping       1  Tired, decreased energy       1  Change in appetite       0  Feeling bad or failure about yourself        3  Trouble concentrating       1  Moving slowly or fidgety/restless       3  Suicidal thoughts       0  PHQ-9 Score       11  Difficult doing work/chores       Somewhat difficult    Review of Systems  Gastrointestinal:        PELVIC PAIN  Musculoskeletal:  Positive for gait problem.       PAIN IN BOTH FEET       Objective:   Physical Exam Awake, alert, appropriate, gaunt, frail appearing, accompanied by wife, NAD Has lost weight 136 - now lost 8 lbs.         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 Also impaired balance due to foot atrophy  Con't methadone 5 mg  3x/day- for chronic pain.   2.   Increase gabapentin to 1200 mg 3x/day- do 1200 mg 2x/day x 1 week, then 3x/day. For nerve pain.   3. Con't Zoloft- just refilled 7/23.    4.  Asked about stem cells- we discussed this.    5.  SCI support group- [id number]- 6-7 pm- last Thursday of the month.    6. Con't Baclofen 5 mg 3x/day- for muscle spasms.   7.   F/U in 3 months- double appt- SCI    I spent a total of  31  minutes on total care today- >50% coordination of care- due to discussion of stem cells and pain options.

## 2022-10-22 ENCOUNTER — Other Ambulatory Visit: Payer: Self-pay | Admitting: Physical Medicine and Rehabilitation

## 2022-10-22 ENCOUNTER — Encounter: Payer: Self-pay | Admitting: Physical Medicine and Rehabilitation

## 2022-10-22 MED ORDER — METHADONE HCL 5 MG PO TABS: 5.0000 mg | ORAL_TABLET | Freq: Three times a day (TID) | ORAL | 0 refills | Status: DC

## 2022-11-19 ENCOUNTER — Encounter: Payer: Self-pay | Admitting: Physical Medicine and Rehabilitation

## 2022-11-21 MED ORDER — METHADONE HCL 5 MG PO TABS: 5.0000 mg | ORAL_TABLET | Freq: Three times a day (TID) | ORAL | 0 refills | Status: DC

## 2022-12-17 ENCOUNTER — Encounter: Payer: Self-pay | Admitting: Physical Medicine and Rehabilitation

## 2022-12-17 ENCOUNTER — Encounter
Payer: Medicare Other | Attending: Physical Medicine and Rehabilitation | Admitting: Physical Medicine and Rehabilitation

## 2022-12-17 VITALS — BP 102/67 | HR 76 | Ht 72.0 in | Wt 132.0 lb

## 2022-12-17 DIAGNOSIS — B3781 Candidal esophagitis: Secondary | ICD-10-CM | POA: Insufficient documentation

## 2022-12-17 DIAGNOSIS — B370 Candidal stomatitis: Secondary | ICD-10-CM | POA: Insufficient documentation

## 2022-12-17 DIAGNOSIS — G894 Chronic pain syndrome: Secondary | ICD-10-CM | POA: Diagnosis present

## 2022-12-17 DIAGNOSIS — S343XXS Injury of cauda equina, sequela: Secondary | ICD-10-CM | POA: Diagnosis not present

## 2022-12-17 DIAGNOSIS — Z5181 Encounter for therapeutic drug level monitoring: Secondary | ICD-10-CM | POA: Insufficient documentation

## 2022-12-17 DIAGNOSIS — N319 Neuromuscular dysfunction of bladder, unspecified: Secondary | ICD-10-CM | POA: Insufficient documentation

## 2022-12-17 DIAGNOSIS — M792 Neuralgia and neuritis, unspecified: Secondary | ICD-10-CM | POA: Insufficient documentation

## 2022-12-17 DIAGNOSIS — N390 Urinary tract infection, site not specified: Secondary | ICD-10-CM | POA: Diagnosis present

## 2022-12-17 DIAGNOSIS — Z79891 Long term (current) use of opiate analgesic: Secondary | ICD-10-CM | POA: Diagnosis present

## 2022-12-17 MED ORDER — METHADONE HCL 5 MG PO TABS: 5.0000 mg | ORAL_TABLET | Freq: Three times a day (TID) | ORAL | 0 refills | Status: DC

## 2022-12-17 MED ORDER — NYSTATIN 100000 UNIT/ML MT SUSP: 5.0000 mL | Freq: Four times a day (QID) | OROMUCOSAL | 5 refills | Status: AC

## 2022-12-17 NOTE — Progress Notes (Signed)
Subjective:    Patient ID: Louis Jordan, male    DOB: 1956/02/20, 67 y.o.   MRN: [id number]  HPI  Pt is a 67 yr old male with Cauda equina syndrome due to fall from ladder 2020- and neurogenic bladder, and bowel, and erectile dysfunction secondary to SCI- and severe nerve pain which is impairing function- here for f/u on cauda equina and Chronic pain. Also impaired balance due to foot atrophy  Also has hx of C5/-7 plate and screws.     Here for f/u on cauda equina syndrome and chronic pain.    Things about the same- per pt But wife, said doing worse- had thrush 3x in last  3 months, 4x in last 1 year.    This last time, diflucan wasn't working well- so needed 2 weeks of Diflucan this past time.  Lost 15 lbs- just getting back to being able to eat normally.  Getting B12 shots- drink 4 ensures/day   Labs so "low" secondary to poor nutrition.  But really gets Thrush any time takes PO ABX for UTI's.   Goes 1/11 to check bloodwork.   Finished therapy- no difference with balance- didn't help-  Balance due to toes/curled.  Have tried sending to podiatry- could do surgery, but they don't recommend for him- but will see back in 1 year to see if got worse.    Hasn't come to SCI support group yet.   Hasn't been taking Baclofen for toe curling- since wanted to get off meds- was taking so much.  Didn't help toe curling.   Couldn't tell one bit of difference with increase of gabapentin- but did put in at bedtime- less pain when woke up-  So still taking TID- 600 mg TID- but third dose is bedtime.  Still taking methadone 5 mg TID.   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 Also found spot on lung- to do CT of chest February 19th. To make sure not growing.   Pain Inventory Average Pain 7 Pain Right Now 7 My pain is sharp, dull, and tingling  LOCATION OF PAIN  Back, Buttocks, Groin, Leg, Ankle,  Toes  BOWEL 7-10   BLADDER Normal   Mobility walk without assistance ability to climb steps?  yes do you drive?  yes Do you have any goals in this area?  yes  Function retired I need assistance with the following:  household duties Do you have any goals in this area?  yes  Neuro/Psych weakness numbness tremor tingling trouble walking confusion depression anxiety  Prior Studies Any changes since last visit?  no  Physicians involved in your care Any changes since last visit?  no   Family History  Problem Relation Age of Onset   Heart disease Mother    Alzheimer's disease Mother    Pancreatic cancer Father    High blood pressure Brother    Social History   Socioeconomic History   Marital status: Married    Spouse name: Not on file   Number of children: Not on file   Years of education: Not on file   Highest education level: Not on file  Occupational History   Not on file  Tobacco Use   Smoking status: Never   Smokeless tobacco: Never  Vaping Use   Vaping Use: Never used  Substance and Sexual Activity   Alcohol use: Not Currently   Drug use: Never   Sexual activity: Not on file  Other  Topics Concern   Not on file  Social History Narrative   Not on file   Social Determinants of Health   Financial Resource Strain: Not on file  Food Insecurity: Not on file  Transportation Needs: Not on file  Physical Activity: Not on file  Stress: Not on file  Social Connections: Not on file   Past Surgical History:  Procedure Laterality Date   APPENDECTOMY  1964   BOTOX INJECTION N/A 02/28/2020   Procedure: CYSTOSCOPY BOTOX 100 UNITS INJECTION;  Surgeon: Alfredo Martinez, MD;  Location: [facility];  Service: Urology;  Laterality: N/A;   CERVICAL FUSION  2000   c 5 to c 7    EYE SURGERY Bilateral 1962   eye muscle sx   INSERTION OF SUPRAPUBIC CATHETER N/A 02/28/2020   Procedure: INSERTION OF SUPRAPUBIC CATHETER;  Surgeon: Alfredo Martinez, MD;  Location: [facility] ;  Service: Urology;  Laterality: N/A;   KNEE SURGERY Right    LUMBAR SPINE SURGERY  10/16/2019   L1 burst fracture with epidural hematoma and canal stenosis   OPEN REDUCTION INTERNAL FIXATION (ORIF) TIBIA/FIBULA FRACTURE Left 10/19/2019   Procedure: OPEN REDUCTION INTERNAL FIXATION (ORIF) TIBIA/FIBULA FRACTURE;  Surgeon: Roby Lofts, MD;  Location: [facility];  Service: Orthopedics;  Laterality: Left;   PROSTATE SURGERY N/A 06/24/2021   Dr. Cleatrice Burke at [facility] ARTHROSCOPY WITH BANKART REPAIR Left 10/19/2019   Procedure: SHOULDER ARTHROSCOPY WITH BANKART REPAIR, EXTENSIVE DEBRIDEMENT, LOOSE BODY REMOVAL;  Surgeon: Bjorn Pippin, MD;  Location: [facility];  Service: Orthopedics;  Laterality: Left;   STRABISMUS SURGERY     TONSILLECTOMY     Past Medical History:  Diagnosis Date   Anemia    Arthritis    Bundle branch block, right    Closed left ankle fracture    Complication of anesthesia    Compression fracture of lumbar vertebrae, non-traumatic (HCC)    Foley catheter in place    last changed 2 weeks ago   GERD (gastroesophageal reflux disease)    Hand pain    mild OA bilateral  hands   Left knee injury 1976   requiring surgery for repair of ligaments/cartilage damage with nerve injury that took 20 years to resolve   MVP (mitral valve prolapse)    mild no cardiologist   Neurogenic bladder    Neurogenic bowel    PONV (postoperative nausea and vomiting)    Spinal cord injury at T7-T12 level (HCC) 10/15/2019   due to accident 10-15-2019 fell 18 feet on concrete   BP 102/67   Pulse 76   Ht 6' (1.829 m)   Wt 132 lb (59.9 kg)   SpO2 98%   BMI 17.90 kg/m   Opioid Risk Score:   Fall Risk Score:  `1  Depression screen [facility] 2/9     12/17/2022   11:32 AM 09/08/2022    1:59 PM 05/16/2022    9:40 AM 11/25/2021   10:36 AM 08/26/2021   10:45 AM 05/29/2021   10:49 AM 09/10/2020   11:43 AM  Depression screen PH[facility] 2/9  Decreased Interest  0 0 0 0 1 1 1   Down, Depressed, Hopeless 0 0  0 1 1 1   PHQ - 2 Score 0 0 0 0 2 2 2      Review of Systems  Musculoskeletal:  Positive for back pain.       Buttock pain Groin pain B/L leg pain B/L ankle pain B/L toe pain  All  other systems reviewed and are negative.     Objective:   Physical Exam  Awake, alert, appropriate, accompanied by wife, NAD Toe curling looks slightly worse-  Also has intrinsics atrophy of feet B/L- which is making him have more toe curling.  Off balance due to toes curling.  No significant redness on top of toes.     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 Also impaired balance due to foot atrophy.  Suggest Diflucan to take to prevent thrush- if EVER takes any antibiotics- just start Diflucan-  If doesn't work in 3 days, then suggest Infectious disease or another anti-fungal - it sounds like you are developing a resistance to Diflucan? Maybe ID can help.   2.  Con't Methadone 5 mg TID-  for chronic pain- and will send in 2 Rx's- one to be filled now and the next 01/20/23  3.  Will ask to see if Botox if helpful- not sure if will help, but will ask colleagues.   4. Needs a bigger toe box than shoes he's wearing right now. Encouraged him to wear new shoes wife bought him.   5. Seeing Urology for Cysto end of month- for SPC/and frequent UTI's.   6. Look into cranberry pills- I think they have some that dissolve on your mouth- cranberry juice is not enough to acidify urine to reduce risk of UTI's.   7. Thrush- Nystatin swish and swallow will give 180 ml- 5 ml 4x/day until symptoms completely gone then be aggressive restarting if having ANY symptoms.   8. Oral drug screen today per clinic policy- since has suprapubic  catheter-   9. F/U 3 months- double visit/SCI

## 2022-12-17 NOTE — Patient Instructions (Signed)
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 Also impaired balance due to foot atrophy.  Suggest Diflucan to take to prevent thrush- if EVER takes any antibiotics- just start Diflucan-  If doesn't work in 3 days, then suggest Infectious disease or another anti-fungal - it sounds like you are developing a resistance to Diflucan? Maybe ID can help.   2.  Con't Methadone 5 mg TID-  for chronic pain- and will send in 2 Rx's- one to be filled now and the next 01/20/23  3.  Will ask to see if Botox if helpful- not sure if will help, but will ask colleagues.   4. Needs a bigger toe box than shoes he's wearing right now. Encouraged him to wear new shoes wife bought him.   5. Seeing Urology for Cysto end of month- for SPC/and frequent UTI's.   6. Look into cranberry pills- I think they have some that dissolve on your mouth- cranberry juice is not enough to acidify urine to reduce risk of UTI's.   7. Thrush- Nystatin swish and swallow will give 180 ml- 5 ml 4x/day until symptoms completely gone then be aggressive restarting if having ANY symptoms.   8. Oral drug screen today per clinic policy- since has suprapubic catheter-   9. F/U 3 months- double visit/SCI

## 2022-12-20 LAB — DRUG TOX MONITOR 1 W/CONF, ORAL FLD
Amphetamines: NEGATIVE ng/mL (ref <10)
Barbiturates: NEGATIVE ng/mL (ref <10)
Benzodiazepines: NEGATIVE ng/mL (ref <0.50)
Buprenorphine: NEGATIVE ng/mL (ref <0.10)
Cocaine: NEGATIVE ng/mL (ref <5.0)
EDDP: NEGATIVE ng/mL (ref <5.0)
Fentanyl: NEGATIVE ng/mL (ref <0.10)
Heroin Metabolite: NEGATIVE ng/mL (ref <1.0)
MARIJUANA: NEGATIVE ng/mL (ref <2.5)
MDMA: NEGATIVE ng/mL (ref <10)
Meprobamate: NEGATIVE ng/mL (ref <2.5)
Methadone: 39.2 ng/mL — ABNORMAL HIGH (ref <5.0)
Methadone: POSITIVE ng/mL — AB (ref <5.0)
Nicotine Metabolite: NEGATIVE ng/mL (ref <5.0)
Opiates: NEGATIVE ng/mL (ref <2.5)
Phencyclidine: NEGATIVE ng/mL (ref <10)
Tapentadol: NEGATIVE ng/mL (ref <5.0)
Tramadol: NEGATIVE ng/mL (ref <5.0)
Zolpidem: NEGATIVE ng/mL (ref <5.0)

## 2022-12-20 LAB — DRUG TOX METHYLPHEN W/CONF,ORAL FLD: Methylphenidate: NEGATIVE ng/mL (ref <1.0)

## 2023-02-21 ENCOUNTER — Encounter: Payer: Self-pay | Admitting: Physical Medicine and Rehabilitation

## 2023-02-21 ENCOUNTER — Other Ambulatory Visit: Payer: Self-pay | Admitting: Physical Medicine and Rehabilitation

## 2023-02-23 MED ORDER — METHADONE HCL 5 MG PO TABS: 5.0000 mg | ORAL_TABLET | Freq: Three times a day (TID) | ORAL | 0 refills | Status: DC

## 2023-03-02 ENCOUNTER — Encounter: Payer: Self-pay | Admitting: Physical Medicine and Rehabilitation

## 2023-03-07 ENCOUNTER — Other Ambulatory Visit: Payer: Self-pay | Admitting: Physical Medicine and Rehabilitation

## 2023-03-08 ENCOUNTER — Other Ambulatory Visit: Payer: Self-pay | Admitting: Physical Medicine and Rehabilitation

## 2023-03-11 ENCOUNTER — Encounter: Payer: Self-pay | Admitting: *Deleted

## 2023-03-17 ENCOUNTER — Other Ambulatory Visit: Payer: Self-pay | Admitting: *Deleted

## 2023-03-18 ENCOUNTER — Encounter: Payer: Self-pay | Admitting: Physical Medicine and Rehabilitation

## 2023-03-18 ENCOUNTER — Encounter
Payer: Medicare Other | Attending: Physical Medicine and Rehabilitation | Admitting: Physical Medicine and Rehabilitation

## 2023-03-18 DIAGNOSIS — S343XXS Injury of cauda equina, sequela: Secondary | ICD-10-CM | POA: Insufficient documentation

## 2023-03-18 DIAGNOSIS — B3781 Candidal esophagitis: Secondary | ICD-10-CM | POA: Diagnosis not present

## 2023-03-18 DIAGNOSIS — N319 Neuromuscular dysfunction of bladder, unspecified: Secondary | ICD-10-CM | POA: Diagnosis not present

## 2023-03-18 DIAGNOSIS — N390 Urinary tract infection, site not specified: Secondary | ICD-10-CM

## 2023-03-18 DIAGNOSIS — B370 Candidal stomatitis: Secondary | ICD-10-CM

## 2023-03-18 DIAGNOSIS — S343XXD Injury of cauda equina, subsequent encounter: Secondary | ICD-10-CM

## 2023-03-18 MED ORDER — FLUCONAZOLE 100 MG PO TABS: ORAL_TABLET | ORAL | 1 refills | Status: AC

## 2023-03-18 MED ORDER — METHADONE HCL 5 MG PO TABS: 5.0000 mg | ORAL_TABLET | Freq: Three times a day (TID) | ORAL | 0 refills | Status: DC

## 2023-03-18 NOTE — Progress Notes (Deleted)
Subjective:    Patient ID: Louis Jordan, male    DOB: 09-May-1956, 67 y.o.   MRN: [id number]  HPI  Pain Inventory Average Pain {NUMBERS; 0-10:5044} Pain Right Now {NUMBERS; 0-10:5044} My pain is {PAIN DESCRIPTION:21022940}  In the last 24 hours, has pain interfered with the following? General activity {NUMBERS; 0-10:5044} Relation with others {NUMBERS; 0-10:5044} Enjoyment of life {NUMBERS; 0-10:5044} What TIME of day is your pain at its worst? {time of day:24191} Sleep (in general) {BHH GOOD/FAIR/POOR:22877}  Pain is worse with: {ACTIVITIES:21022942} Pain improves with: {PAIN IMPROVES BW:[id number] Relief from Meds: {NUMBERS; 0-10:5044}  Family History  Problem Relation Age of Onset   Heart disease Mother    Alzheimer's disease Mother    Pancreatic cancer Father    High blood pressure Brother    Social History   Socioeconomic History   Marital status: Married    Spouse name: Not on file   Number of children: Not on file   Years of education: Not on file   Highest education level: Not on file  Occupational History   Not on file  Tobacco Use   Smoking status: Never   Smokeless tobacco: Never  Vaping Use   Vaping Use: Never used  Substance and Sexual Activity   Alcohol use: Not Currently   Drug use: Never   Sexual activity: Not on file  Other Topics Concern   Not on file  Social History Narrative   Not on file   Social Determinants of Health   Financial Resource Strain: Not on file  Food Insecurity: Not on file  Transportation Needs: Not on file  Physical Activity: Not on file  Stress: Not on file  Social Connections: Not on file   Past Surgical History:  Procedure Laterality Date   APPENDECTOMY  1964   BOTOX INJECTION N/A 02/28/2020   Procedure: CYSTOSCOPY BOTOX 100 UNITS INJECTION;  Surgeon: Bjorn Loser, MD;  Location: [place];  Service: Urology;  Laterality: N/A;   CERVICAL FUSION  2000   c 5 to c 7    EYE SURGERY  Bilateral 1962   eye muscle sx   INSERTION OF SUPRAPUBIC CATHETER N/A 02/28/2020   Procedure: INSERTION OF SUPRAPUBIC CATHETER;  Surgeon: Bjorn Loser, MD;  Location: [place];  Service: Urology;  Laterality: N/A;   KNEE SURGERY Right    LUMBAR SPINE SURGERY  10/16/2019   L1 burst fracture with epidural hematoma and canal stenosis   OPEN REDUCTION INTERNAL FIXATION (ORIF) TIBIA/FIBULA FRACTURE Left 10/19/2019   Procedure: OPEN REDUCTION INTERNAL FIXATION (ORIF) TIBIA/FIBULA FRACTURE;  Surgeon: Shona Needles, MD;  Location: [place];  Service: Orthopedics;  Laterality: Left;   PROSTATE SURGERY N/A 06/24/2021   Dr. Thomasene Mohair at [place] Left 10/19/2019   Procedure: SHOULDER ARTHROSCOPY WITH BANKART REPAIR, EXTENSIVE DEBRIDEMENT, LOOSE BODY REMOVAL;  Surgeon: Hiram Gash, MD;  Location: [place];  Service: Orthopedics;  Laterality: Left;   STRABISMUS SURGERY     TONSILLECTOMY     Past Surgical History:  Procedure Laterality Date   APPENDECTOMY  1964   BOTOX INJECTION N/A 02/28/2020   Procedure: CYSTOSCOPY BOTOX 100 UNITS INJECTION;  Surgeon: Bjorn Loser, MD;  Location: [place];  Service: Urology;  Laterality: N/A;   CERVICAL FUSION  2000   c 5 to c 7    EYE SURGERY Bilateral 1962   eye muscle sx   INSERTION OF SUPRAPUBIC CATHETER N/A 02/28/2020  Procedure: INSERTION OF SUPRAPUBIC CATHETER;  Surgeon: Bjorn Loser, MD;  Location: [facility];  Service: Urology;  Laterality: N/A;   KNEE SURGERY Right    LUMBAR SPINE SURGERY  10/16/2019   L1 burst fracture with epidural hematoma and canal stenosis   OPEN REDUCTION INTERNAL FIXATION (ORIF) TIBIA/FIBULA FRACTURE Left 10/19/2019   Procedure: OPEN REDUCTION INTERNAL FIXATION (ORIF) TIBIA/FIBULA FRACTURE;  Surgeon: Shona Needles, MD;  Location: [place];  Service: Orthopedics;  Laterality: Left;   PROSTATE SURGERY N/A 06/24/2021   Dr. Thomasene Mohair  at [place] Left 10/19/2019   Procedure: SHOULDER ARTHROSCOPY WITH BANKART REPAIR, EXTENSIVE DEBRIDEMENT, LOOSE BODY REMOVAL;  Surgeon: Hiram Gash, MD;  Location: [place];  Service: Orthopedics;  Laterality: Left;   STRABISMUS SURGERY     TONSILLECTOMY     Past Medical History:  Diagnosis Date   Anemia    Arthritis    Bundle branch block, right    Closed left ankle fracture    Complication of anesthesia    Compression fracture of lumbar vertebrae, non-traumatic (HCC)    Foley catheter in place    last changed 2 weeks ago   GERD (gastroesophageal reflux disease)    Hand pain    mild OA bilateral  hands   Left knee injury 1976   requiring surgery for repair of ligaments/cartilage damage with nerve injury that took 20 years to resolve   MVP (mitral valve prolapse)    mild no cardiologist   Neurogenic bladder    Neurogenic bowel    PONV (postoperative nausea and vomiting)    Spinal cord injury at T7-T12 level ([place]) 10/15/2019   due to accident 10-15-2019 fell 18 feet on concrete   There were no vitals taken for this visit.  Opioid Risk Score:   Fall Risk Score:  `1  Depression screen [facility] 2/9     12/17/2022   11:32 AM 09/08/2022    1:59 PM 05/16/2022    9:40 AM 11/25/2021   10:36 AM 08/26/2021   10:45 AM 05/29/2021   10:49 AM 09/10/2020   11:43 AM  Depression screen PHQ 2/9  Decreased Interest 0 0 0 0 1 1 1   Down, Depressed, Hopeless 0 0  0 1 1 1   PHQ - 2 Score 0 0 0 0 2 2 2      Review of Systems     Objective:   Physical Exam        Assessment & Plan:

## 2023-03-18 NOTE — Progress Notes (Signed)
Subjective:    Patient ID: Louis Jordan, male    DOB: 30-Jun-1956, 67 y.o.   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 Also impaired balance due to foot atrophy  Also has hx of C5/-7 plate and screws.      Drove here- drives regularly.   Working on feet- went to podiatrist- put in contact with someone to make soles for feet.  Custom soles- made clay/like impressions to make soles for shoes.   Pain starts wearing when been through for a few years Has to get out of bed immediately to get mind off pain-   Different plans every day to stay out of pain cycle.   Nutrition is doing better- doing 2100 calories/day-  Drinks 1 supplements Oatmeal and fruit and eggs; then lunch and then another supplement for extra calories if need be between lunch and dinner.    Got Cysto- everything good- gets one every 8 weeks.  Checking for scar material that grows back.   CT showed no change in lung nodule size. And had of "whole body" and showed nothing.     Has thrush now- asking for diflucan  Started on Valium QHS- 1 pill not quite enough, 2 pills too much- to relax sphincter- when takes it, can empty ~ 1/2 bladder.   Balance is SO bad-  Pain Inventory Average Pain 8 Pain Right Now 8 My pain is constant, sharp, burning, dull, tingling, and aching  LOCATION OF PAIN  Back, buttocks, groin  BOWEL Number of stools per week: 12 Oral laxative use Yes  Type of laxative oral  BLADDER Suprapubic  Frequent urination Yes   Incomplete bladder emptying Yes    Mobility walk without assistance ability to climb steps?  yes do you drive?  yes  Function retired  Neuro/Psych bladder control problems numbness tingling  Prior Studies Any changes since  last visit?  yes CT/MRI in the [facility]  Physicians involved in your care Any changes since last visit?  no   Family History  Problem Relation Age of Onset   Heart disease Mother    Alzheimer's disease Mother    Pancreatic cancer Father    High blood pressure Brother    Social History   Socioeconomic History   Marital status: Married    Spouse name: Not on file   Number of children: Not on file   Years of education: Not on file   Highest education level: Not on file  Occupational History   Not on file  Tobacco Use   Smoking status: Never   Smokeless tobacco: Never  Vaping Use   Vaping Use: Never used  Substance and Sexual Activity   Alcohol use: Not Currently   Drug use: Never   Sexual activity: Not on file  Other Topics Concern   Not on file  Social History Narrative   Not on file   Social Determinants of Health   Financial Resource Strain: Not on file  Food Insecurity: Not on file  Transportation Needs: Not on file  Physical Activity: Not on file  Stress: Not on file  Social Connections: Not on file   Past Surgical History:  Procedure Laterality Date   [place]  INJECTION N/A 02/28/2020   Procedure: CYSTOSCOPY BOTOX 100 UNITS INJECTION;  Surgeon: Bjorn Loser, MD;  Location: [facility];  Service: Urology;  Laterality: N/A;   CERVICAL FUSION  2000   c 5 to c 7    EYE SURGERY Bilateral 1962   eye muscle sx   INSERTION OF SUPRAPUBIC CATHETER N/A 02/28/2020   Procedure: INSERTION OF SUPRAPUBIC CATHETER;  Surgeon: Bjorn Loser, MD;  Location: [place];  Service: Urology;  Laterality: N/A;   KNEE SURGERY Right    LUMBAR SPINE SURGERY  10/16/2019   L1 burst fracture with epidural hematoma and canal stenosis   OPEN REDUCTION INTERNAL FIXATION (ORIF) TIBIA/FIBULA FRACTURE Left 10/19/2019   Procedure: OPEN REDUCTION INTERNAL FIXATION (ORIF) TIBIA/FIBULA FRACTURE;  Surgeon: Shona Needles, MD;   Location: [place];  Service: Orthopedics;  Laterality: Left;   PROSTATE SURGERY N/A 06/24/2021   Dr. Thomasene Mohair at [place] Left 10/19/2019   Procedure: SHOULDER ARTHROSCOPY WITH BANKART REPAIR, EXTENSIVE DEBRIDEMENT, LOOSE BODY REMOVAL;  Surgeon: Hiram Gash, MD;  Location: [place];  Service: Orthopedics;  Laterality: Left;   STRABISMUS SURGERY     TONSILLECTOMY     Past Medical History:  Diagnosis Date   Anemia    Arthritis    Bundle branch block, right    Closed left ankle fracture    Complication of anesthesia    Compression fracture of lumbar vertebrae, non-traumatic    Foley catheter in place    last changed 2 weeks ago   GERD (gastroesophageal reflux disease)    Hand pain    mild OA bilateral  hands   Left knee injury 1976   requiring surgery for repair of ligaments/cartilage damage with nerve injury that took 20 years to resolve   MVP (mitral valve prolapse)    mild no cardiologist   Neurogenic bladder    Neurogenic bowel    PONV (postoperative nausea and vomiting)    Spinal cord injury at T7-T12 level 10/15/2019   due to accident 10-15-2019 fell 18 feet on concrete   There were no vitals taken for this visit.  Opioid Risk Score:   Fall Risk Score:  `1  Depression screen [facility] 2/9     12/17/2022   11:32 AM 09/08/2022    1:59 PM 05/16/2022    9:40 AM 11/25/2021   10:36 AM 08/26/2021   10:45 AM 05/29/2021   10:49 AM 09/10/2020   11:43 AM  Depression screen PHQ 2/9  Decreased Interest 0 0 0 0 1 1 1   Down, Depressed, Hopeless 0 0  0 1 1 1   PHQ - 2 Score 0 0 0 0 2 2 2     Review of Systems  Genitourinary:        Suprapubic   Neurological:  Positive for numbness.       Tingling  All other systems reviewed and are negative.      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 Also impaired balance due to foot atrophy  Also  has hx of C5/-7 plate and screws.    Con't Methadone 5 mg 3x/day- #90- is due for pain meds on 4/9 so will refill 2. Diflucan 200 mg x 1 then 100 mg daily x 6 days- sent in with 1 refill.  Has been 2.5 months since last treated- want him to have a refill so as soon as Sx's- to improve Tx.   3.  On ABX for bladder infection- whoever prescribes antibiotics, needs to give Rx for Diflucan.  4. Taking cranberry pills-   5. Also has refills on Nystatin at last visit- has 5 refills, so can also use for thrush.    6. F/U in 3 months double visit- on chronic pain and SCI   7. Getting Valium from Urology for sphincter relaxation      I spent a total of  26  minutes on total care today- >50% coordination of care- due to discussing thrush, pain and no new pain med options.

## 2023-03-18 NOTE — Patient Instructions (Signed)
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 Also impaired balance due to foot atrophy  Also has hx of C5/-7 plate and screws.    Con't Methadone 5 mg 3x/day- #90- is due for pain meds on 4/9 so will refill 2. Diflucan 200 mg x 1 then 100 mg daily x 6 days- sent in with 1 refill.  Has been 2.5 months since last treated- want him to have a refill so as soon as Sx's- to improve Tx.   3.  On ABX for bladder infection- whoever prescribes antibiotics, needs to give Rx for Diflucan.  4. Taking cranberry pills-   5. Also has refills on Nystatin at last visit- has 5 refills, so can also use for thrush.    6. F/U in 3 months double visit- on chronic pain and SCI

## 2023-04-01 ENCOUNTER — Other Ambulatory Visit: Payer: Self-pay | Admitting: Physical Medicine and Rehabilitation

## 2023-04-01 ENCOUNTER — Ambulatory Visit: Payer: Self-pay | Admitting: Podiatry

## 2023-04-21 ENCOUNTER — Encounter: Payer: Self-pay | Admitting: Physical Medicine and Rehabilitation

## 2023-04-22 MED ORDER — METHADONE HCL 5 MG PO TABS: 5.0000 mg | ORAL_TABLET | Freq: Three times a day (TID) | ORAL | 0 refills | Status: DC

## 2023-05-24 ENCOUNTER — Encounter: Payer: Self-pay | Admitting: Physical Medicine and Rehabilitation

## 2023-05-24 ENCOUNTER — Other Ambulatory Visit: Payer: Self-pay | Admitting: Physical Medicine and Rehabilitation

## 2023-05-25 ENCOUNTER — Other Ambulatory Visit: Payer: Self-pay | Admitting: Physical Medicine and Rehabilitation

## 2023-05-25 ENCOUNTER — Other Ambulatory Visit: Payer: Self-pay

## 2023-05-25 MED ORDER — METHADONE HCL 5 MG PO TABS: 5.0000 mg | ORAL_TABLET | Freq: Three times a day (TID) | ORAL | 0 refills | Status: DC | PRN

## 2023-05-25 NOTE — Telephone Encounter (Signed)
Filled  Written  ID  Drug  QTY  Days  Prescriber  RX #  Dispenser  Refill  Daily Dose*  Pymt Type  PMP  05/20/2023 03/09/2023 4  Diazepam 5 Mg Tablet 60.00 30 Pa Cou [id number] [facility] (4116) 0/1 1.00 LME Medicare [place] 04/22/2023 04/22/2023 4  Methadone Hcl 5 Mg Tablet 90.00 30 Me Lov [id number] [facility] (4116) 0/0 45.00 MME Medicare [place] 03/23/2023 03/18/2023 4  Methadone Hcl 5 Mg Tablet 90.00 30 Me Lov [id number] [facility] (4116) 0/0 45.00 MME Medicare [place]  Methadone refill request. Please review Dr. Berline Chough is out of the office this week.

## 2023-05-25 NOTE — Telephone Encounter (Signed)
PMP was Reviewed.  Methadone e-scribed today.  Call placed to Mr. Donofrio, no answer. Left message to return the call.

## 2023-06-17 ENCOUNTER — Encounter
Payer: Medicare Other | Attending: Physical Medicine and Rehabilitation | Admitting: Physical Medicine and Rehabilitation

## 2023-06-17 ENCOUNTER — Encounter: Payer: Self-pay | Admitting: Physical Medicine and Rehabilitation

## 2023-06-17 VITALS — BP 124/70 | HR 57 | Ht 72.0 in | Wt 143.0 lb

## 2023-06-17 DIAGNOSIS — F329 Major depressive disorder, single episode, unspecified: Secondary | ICD-10-CM | POA: Diagnosis present

## 2023-06-17 DIAGNOSIS — S343XXD Injury of cauda equina, subsequent encounter: Secondary | ICD-10-CM | POA: Diagnosis not present

## 2023-06-17 DIAGNOSIS — S343XXS Injury of cauda equina, sequela: Secondary | ICD-10-CM | POA: Diagnosis present

## 2023-06-17 DIAGNOSIS — E871 Hypo-osmolality and hyponatremia: Secondary | ICD-10-CM | POA: Diagnosis present

## 2023-06-17 DIAGNOSIS — G894 Chronic pain syndrome: Secondary | ICD-10-CM | POA: Insufficient documentation

## 2023-06-17 MED ORDER — METHADONE HCL 5 MG PO TABS: 5.0000 mg | ORAL_TABLET | Freq: Three times a day (TID) | ORAL | 0 refills | Status: DC | PRN

## 2023-06-17 MED ORDER — OXCARBAZEPINE 300 MG PO TABS: 300.0000 mg | ORAL_TABLET | Freq: Every day | ORAL | 5 refills | Status: DC

## 2023-06-17 NOTE — Patient Instructions (Addendum)
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 Also impaired balance due to foot atrophy  Also has hx of C5/-7 plate and screws.     Managing pain- still in so much pain.   - tried- Keppra but didn't try Trileptal-  so will try that. Tried Cymbalta, Gabapentin, and Lyrica- on gabapentin.   2. If needs referral- to see Dr Yetta Barre, let me know- but hopefully can see without a referral.    3. Suggest moving guns to place not easily accessible. Excess of caution.   4.  Can increase Zoloft to 150 mg daily- for mood. Rx written for 150 mg daily.   5. Not taking Trazodone anymore.   6. Trileptal/Oxcarbamazepine- 300 mg nightly x 1 week, then 600 mg nightly x 1 week, then 900 mg nightly-    7. Check sodium level in 3-4 weeks to make sure doing OK.  HAVE to go to [facility] to get done.   8. Can take Benadryl with meds- just know can cause urinary retention for a few hours.  Can take for sleep and for allergic Sx's.   9. F/U in 3 months- double appt- SCI/chronic pain
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
## 2023-06-19 MED ORDER — METHADONE HCL 5 MG PO TABS: 5.0000 mg | ORAL_TABLET | Freq: Three times a day (TID) | ORAL | 0 refills | Status: DC | PRN

## 2023-07-07 ENCOUNTER — Encounter: Payer: Self-pay | Admitting: *Deleted

## 2023-07-07 ENCOUNTER — Other Ambulatory Visit: Payer: Self-pay | Admitting: Physical Medicine and Rehabilitation

## 2023-07-07 NOTE — Telephone Encounter (Signed)
Opened in error

## 2023-07-08 LAB — BASIC METABOLIC PANEL
BUN/Creatinine Ratio: 24 (ref 10–24)
BUN: 20 mg/dL (ref 8–27)
CO2: 29 mmol/L (ref 20–29)
Calcium: 9.4 mg/dL (ref 8.6–10.2)
Chloride: 98 mmol/L (ref 96–106)
Glucose: 87 mg/dL (ref 70–99)
Potassium: 4.5 mmol/L (ref 3.5–5.2)
eGFR: 95 mL/min/{1.73_m2} (ref >59)

## 2023-09-07 ENCOUNTER — Other Ambulatory Visit: Payer: Self-pay | Admitting: Physical Medicine and Rehabilitation

## 2023-09-08 ENCOUNTER — Encounter: Payer: Self-pay | Admitting: Physical Medicine and Rehabilitation

## 2023-09-08 MED ORDER — METHADONE HCL 5 MG PO TABS: 5.0000 mg | ORAL_TABLET | Freq: Three times a day (TID) | ORAL | 0 refills | Status: DC | PRN

## 2023-09-18 ENCOUNTER — Encounter: Payer: Self-pay | Admitting: Physical Medicine and Rehabilitation

## 2023-09-18 ENCOUNTER — Ambulatory Visit: Payer: Medicare Other | Admitting: Podiatry

## 2023-09-18 ENCOUNTER — Encounter
Payer: Medicare Other | Attending: Physical Medicine and Rehabilitation | Admitting: Physical Medicine and Rehabilitation

## 2023-09-18 VITALS — BP 121/77 | HR 55 | Ht 72.0 in | Wt 136.0 lb

## 2023-09-18 DIAGNOSIS — S134XXA Sprain of ligaments of cervical spine, initial encounter: Secondary | ICD-10-CM

## 2023-09-18 DIAGNOSIS — S134XXD Sprain of ligaments of cervical spine, subsequent encounter: Secondary | ICD-10-CM | POA: Insufficient documentation

## 2023-09-18 DIAGNOSIS — S343XXD Injury of cauda equina, subsequent encounter: Secondary | ICD-10-CM | POA: Diagnosis not present

## 2023-09-18 DIAGNOSIS — M7918 Myalgia, other site: Secondary | ICD-10-CM | POA: Insufficient documentation

## 2023-09-18 DIAGNOSIS — S343XXS Injury of cauda equina, sequela: Secondary | ICD-10-CM | POA: Insufficient documentation

## 2023-09-18 DIAGNOSIS — G894 Chronic pain syndrome: Secondary | ICD-10-CM | POA: Diagnosis not present

## 2023-09-18 DIAGNOSIS — M792 Neuralgia and neuritis, unspecified: Secondary | ICD-10-CM

## 2023-09-18 MED ORDER — METHADONE HCL 10 MG PO TABS: 5.0000 mg | ORAL_TABLET | Freq: Three times a day (TID) | ORAL | 0 refills | Status: DC

## 2023-09-18 MED ORDER — GABAPENTIN 600 MG PO TABS: 1200.0000 mg | ORAL_TABLET | Freq: Three times a day (TID) | ORAL | 1 refills | Status: DC
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
## 2023-09-25 ENCOUNTER — Ambulatory Visit: Payer: Medicare Other | Admitting: Podiatry

## 2023-09-25 ENCOUNTER — Encounter: Payer: Self-pay | Admitting: Physical Medicine and Rehabilitation

## 2023-09-25 ENCOUNTER — Encounter: Payer: Medicare Other | Admitting: Physical Medicine and Rehabilitation

## 2023-09-25 VITALS — BP 123/71 | HR 53 | Ht 72.0 in | Wt 138.0 lb

## 2023-09-25 DIAGNOSIS — S343XXS Injury of cauda equina, sequela: Secondary | ICD-10-CM | POA: Diagnosis not present

## 2023-09-25 DIAGNOSIS — S134XXD Sprain of ligaments of cervical spine, subsequent encounter: Secondary | ICD-10-CM

## 2023-09-25 DIAGNOSIS — M7918 Myalgia, other site: Secondary | ICD-10-CM | POA: Diagnosis not present

## 2023-09-25 MED ORDER — LIDOCAINE HCL 1 % IJ SOLN
6.0000 mL | Freq: Once | INTRAMUSCULAR | Status: AC
Start: 2023-09-25 — End: 2023-09-25
  Administered 2023-09-25: 6 mL

## 2023-09-25 NOTE — Patient Instructions (Signed)
Plan: Patient here for trigger point injections for whiplash  Consent done and on chart.  Cleaned areas with alcohol and injected using a 27 gauge 1.5 inch needle  Injected  6cc- none wasted Using 1% Lidocaine with no EPI  Upper traps B/L x3 Levators- B/L  Posterior scalenes Middle scalenes- B/L  Splenius Capitus  B/L  Pectoralis Major B/L  Rhomboids Infraspinatus Teres Major/minor Thoracic paraspinals Lumbar paraspinals Other injections-  SCM's B/L   Patient's level of pain prior was 6-7/10 Current level of pain after injections is  improved ROM- down to 2/10- whole lot better  There was no bleeding or complications.  Patient was advised to drink a lot of water on day after injections to flush system Will have increased soreness for 12-48 hours after injections.  Can use Lidocaine patches the day AFTER injections Can use theracane on day of injections in places didn't inject Can use heating pad  or ice 4-6 hours AFTER injections - no weights today- can start them back tomorrow  2.  F/U as scheduled
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
## 2023-09-30 ENCOUNTER — Ambulatory Visit: Payer: Medicare Other | Admitting: Podiatry

## 2023-10-01 ENCOUNTER — Ambulatory Visit: Payer: Medicare Other | Admitting: Podiatry

## 2023-10-01 ENCOUNTER — Ambulatory Visit: Payer: Medicare Other

## 2023-10-01 ENCOUNTER — Ambulatory Visit
Admission: RE | Admit: 2023-10-01 | Discharge: 2023-10-01 | Disposition: A | Discharge status: Home / Self Care | Payer: Medicare Other | Source: Ambulatory Visit | Attending: Physical Medicine and Rehabilitation | Admitting: Physical Medicine and Rehabilitation

## 2023-10-01 ENCOUNTER — Encounter: Payer: Self-pay | Admitting: Podiatry

## 2023-10-01 DIAGNOSIS — M2042 Other hammer toe(s) (acquired), left foot: Secondary | ICD-10-CM

## 2023-10-01 DIAGNOSIS — M2041 Other hammer toe(s) (acquired), right foot: Secondary | ICD-10-CM | POA: Diagnosis not present

## 2023-10-02 ENCOUNTER — Telehealth: Payer: Self-pay | Admitting: Podiatry

## 2023-10-02 NOTE — Telephone Encounter (Addendum)
DOS- 11/03/2023  HAMMERTOE 2ND - 5TH LT - [id number] HALLUX IPJ [id number]   [employer] EFFECTIVE DATE- 12/15/2022  DEDUCTIBLE- $0.00 WITH REMAINING $0.00 OOP- $3600.00 WITH REMAINING $2957.88 COINSURANCE- 0%  PER THE [employer]HC WEBSITE PORTAL, PRIOR AUTH IS NOT REQUIRED FOR CPT CODE 01027 GOOD FROM 11/03/2023 - 12/15/2023  Decision ID #: [id number]   PER THE [employer] WEBSITE PORTAL, PRIOR AUTH IS NOT REQUIRED FOR CPT CODE 74259, GOOD FROM 11/03/2023 - 12/15/2023   AUTH Decision ID #: [id number]

## 2023-10-05 MED ORDER — METHADONE HCL 10 MG PO TABS: 10.0000 mg | ORAL_TABLET | Freq: Three times a day (TID) | ORAL | 0 refills | Status: DC

## 2023-10-05 NOTE — Progress Notes (Signed)
Subjective:   Patient ID: Louis Jordan, male   DOB: 67 y.o.   MRN: [id number]   HPI Patient presents stating that he had a spinal cord injury 4 years ago and he has developed instability in his lesser digits and difficulty with ambulation of which some may be due to the more proximal injury but the curled toes of left foot over right seem to be a big issue.  Patient does not smoke tries to stay active and normal weight   Review of Systems  All other systems reviewed and are negative.       Objective:  Physical Exam Vitals and nursing note reviewed.  Constitutional:      Appearance: He is well-developed.  Pulmonary:     Effort: Pulmonary effort is normal.  Musculoskeletal:        General: Normal range of motion.  Skin:    General: Skin is warm.  Neurological:     Mental Status: He is alert.     Neurovascular status intact muscle strength adequate range of motion adequate with rigid contracture lesser digits left over right that are irritative and do not seem to provide good function.  He does appear to have reasonable strength but he is not functioning properly.  Good digital perfusion well-oriented x 3     Assessment:  Severe previous injury with leg and back issues from falling off a ladder with digital deformities which have formed since then left over right     Plan:  H&P reviewed at great length and discussed at great length.  I discussed digital fusions and that this may give him more stability but I absolutely cannot make any guarantees and I will make a difference given his history and he understands this completely and wants to do these procedures due to the pain he is experiencing and the instability.  I have recommended digital fusion digits 234 and arthroplasty digit 5 and I allowed him to read consent form going over alternative treatments complications and absolutely the fact this may not improve his motion and stability.  Patient signed consent form scheduled  outpatient surgery understands he will have pins in his toe for approximate 5 weeks all questions answered today and encouraged to call any questions prior to procedure  X-rays indicate significant contracture lesser digits left over right

## 2023-10-14 ENCOUNTER — Encounter: Payer: Self-pay | Admitting: Podiatry

## 2023-10-29 NOTE — Progress Notes (Unsigned)
Subjective:    Patient ID: Louis Jordan, male    DOB: 01/13/1956, 67 y.o.   MRN: [id number]  HPI: [name] is a 67 y.o. male who returns for follow up appointment for chronic pain and medication refill. states *** pain is located in  ***. rates pain ***. current exercise regime is walking and performing stretching exercises.  Mr. Niece Morphine equivalent is *** MME.   Oral Swab was Order today.      Pain Inventory Average Pain 7 Pain Right Now 8 My pain is burning, tingling, and aching  In the last 24 hours, has pain interfered with the following? General activity 7 Relation with others 7 Enjoyment of life 10 What TIME of day is your pain at its worst? morning  Sleep (in general) Fair  Pain is worse with: bending, sitting, and some activites Pain improves with: medication Relief from Meds: 5  Family History  Problem Relation Age of Onset   Heart disease Mother    Alzheimer's disease Mother    Pancreatic cancer Father    High blood pressure Brother    Social History   Socioeconomic History   Marital status: Married    Spouse name: Not on file   Number of children: Not on file   Years of education: Not on file   Highest education level: Not on file  Occupational History   Not on file  Tobacco Use   Smoking status: Never   Smokeless tobacco: Never  Vaping Use   Vaping status: Never Used  Substance and Sexual Activity   Alcohol use: Not Currently   Drug use: Never   Sexual activity: Not on file  Other Topics Concern   Not on file  Social History Narrative   Not on file   Social Determinants of Health   Financial Resource Strain: Low Risk  (03/28/2020)   Received from [facility] visits prior to 02/14/2023., [facility] visits prior to 02/14/2023.   Overall Financial Resource Strain (CARDIA)    Difficulty of Paying Living Expenses: Not hard at all  Food Insecurity: Low Risk  (09/15/2023)   [facility] from  [facility]   Hunger Vital Sign    Worried About Running Out of Food in the Last Year: Never true    Ran Out of Food in the Last Year: Never true  Transportation Needs: No Transportation Needs (09/15/2023)   Received from [facility]    In the past 12 months, has lack of reliable transportation kept you from medical appointments, meetings, work or from getting things needed for daily living? : No  Physical Activity: Sufficiently Active (12/31/2021)   [facility] from [facility] visits prior to 02/14/2023., [facility] visits prior to 02/14/2023.   Exercise Vital Sign    Days of Exercise per Week: 3 days    Minutes of Exercise per Session: 50 min  Stress: No Stress Concern Present (03/28/2020)   [facility] from [facility] visits prior to 02/14/2023., [facility] visits prior to 02/14/2023.   [name]    Feeling of Stress : Not at all  Social Connections: Unknown (04/22/2022)   Received from [facility], [facility]   Social Network    Social Network: Not on file   Past Surgical History:  Procedure Laterality Date   APPENDECTOMY  1964  BOTOX INJECTION N/A 02/28/2020   Procedure: CYSTOSCOPY BOTOX 100 UNITS INJECTION;  Surgeon: Alfredo Martinez, MD;  Location: [facility];  Service: Urology;  Laterality: N/A;   CERVICAL FUSION  2000   c 5 to c 7    EYE SURGERY Bilateral 1962   eye muscle sx   INSERTION OF SUPRAPUBIC CATHETER N/A 02/28/2020   Procedure: INSERTION OF SUPRAPUBIC CATHETER;  Surgeon: Alfredo Martinez, MD;  Location: [facility] [place];  Service: Urology;  Laterality: N/A;   KNEE SURGERY Right    LUMBAR SPINE SURGERY  10/16/2019   L1 burst fracture with epidural hematoma and canal stenosis   OPEN REDUCTION INTERNAL FIXATION (ORIF) TIBIA/FIBULA FRACTURE Left 10/19/2019   Procedure: OPEN  REDUCTION INTERNAL FIXATION (ORIF) TIBIA/FIBULA FRACTURE;  Surgeon: Roby Lofts, MD;  Location: [facility] OR;  Service: Orthopedics;  Laterality: Left;   PROSTATE SURGERY N/A 06/24/2021   Dr. Cleatrice Burke at [facility] ARTHROSCOPY WITH BANKART REPAIR Left 10/19/2019   Procedure: SHOULDER ARTHROSCOPY WITH BANKART REPAIR, EXTENSIVE DEBRIDEMENT, LOOSE BODY REMOVAL;  Surgeon: Bjorn Pippin, MD;  Location: [facility] OR;  Service: Orthopedics;  Laterality: Left;   STRABISMUS SURGERY     TONSILLECTOMY     Past Surgical History:  Procedure Laterality Date   APPENDECTOMY  1964   BOTOX INJECTION N/A 02/28/2020   Procedure: CYSTOSCOPY BOTOX 100 UNITS INJECTION;  Surgeon: Alfredo Martinez, MD;  Location: [facility] [place];  Service: Urology;  Laterality: N/A;   CERVICAL FUSION  2000   c 5 to c 7    EYE SURGERY Bilateral 1962   eye muscle sx   INSERTION OF SUPRAPUBIC CATHETER N/A 02/28/2020   Procedure: INSERTION OF SUPRAPUBIC CATHETER;  Surgeon: Alfredo Martinez, MD;  Location: [facility] [place];  Service: Urology;  Laterality: N/A;   KNEE SURGERY Right    LUMBAR SPINE SURGERY  10/16/2019   L1 burst fracture with epidural hematoma and canal stenosis   OPEN REDUCTION INTERNAL FIXATION (ORIF) TIBIA/FIBULA FRACTURE Left 10/19/2019   Procedure: OPEN REDUCTION INTERNAL FIXATION (ORIF) TIBIA/FIBULA FRACTURE;  Surgeon: Roby Lofts, MD;  Location: [facility];  Service: Orthopedics;  Laterality: Left;   PROSTATE SURGERY N/A 06/24/2021   [facility] Cleatrice Burke at [facility] ARTHROSCOPY WITH BANKART REPAIR Left 10/19/2019   Procedure: SHOULDER ARTHROSCOPY WITH BANKART REPAIR, EXTENSIVE DEBRIDEMENT, LOOSE BODY REMOVAL;  Surgeon: Bjorn Pippin, MD;  Location: [facility] OR;  Service: Orthopedics;  Laterality: Left;   STRABISMUS SURGERY     TONSILLECTOMY     Past Medical History:  Diagnosis Date   Anemia    Arthritis    Bundle branch block, right    Closed left ankle fracture    Complication of anesthesia     Compression fracture of lumbar vertebrae, non-traumatic (HCC)    Foley catheter in place    last changed 2 weeks ago   GERD (gastroesophageal reflux disease)    Hand pain    mild OA bilateral  hands   Left knee injury 1976   requiring surgery for repair of ligaments/cartilage damage with nerve injury that took 20 years to resolve   MVP (mitral valve prolapse)    mild no cardiologist   Neurogenic bladder    Neurogenic bowel    PONV (postoperative nausea and vomiting)    Spinal cord injury at T7-T12 level (HCC) 10/15/2019   due to accident 10-15-2019 fell 18 feet on concrete   There were no vitals taken for this visit.  Opioid  Risk Score:   Fall Risk Score:  `1  Depression screen [facility] 2/9     09/18/2023   11:18 AM 03/18/2023   11:30 AM 12/17/2022   11:32 AM 09/08/2022    1:59 PM 05/16/2022    9:40 AM 11/25/2021   10:36 AM 08/26/2021   10:45 AM  Depression screen PHQ 2/9  Decreased Interest 1 0 0 0 0 0 1  Down, Depressed, Hopeless 1 0 0 0  0 1  PHQ - 2 Score 2 0 0 0 0 0 2    Review of Systems  Constitutional: Negative.   HENT: Negative.    Eyes: Negative.   Respiratory: Negative.    Cardiovascular: Negative.   Gastrointestinal: Negative.   Endocrine: Negative.   Genitourinary: Negative.   Musculoskeletal:  Positive for back pain.  Skin: Negative.   Allergic/Immunologic: Negative.   Neurological: Negative.        Tingling  Hematological: Negative.   Psychiatric/Behavioral: Negative.    All other systems reviewed and are negative.      Objective:   Physical Exam        Assessment & Plan:

## 2023-10-30 ENCOUNTER — Encounter: Payer: Self-pay | Admitting: Registered Nurse

## 2023-10-30 ENCOUNTER — Encounter: Payer: Medicare Other | Attending: Physical Medicine and Rehabilitation | Admitting: Registered Nurse

## 2023-10-30 VITALS — BP 132/83 | HR 58 | Ht 72.0 in | Wt 138.0 lb

## 2023-10-30 DIAGNOSIS — S343XXS Injury of cauda equina, sequela: Secondary | ICD-10-CM | POA: Insufficient documentation

## 2023-10-30 DIAGNOSIS — Z79891 Long term (current) use of opiate analgesic: Secondary | ICD-10-CM | POA: Diagnosis present

## 2023-10-30 DIAGNOSIS — Z5181 Encounter for therapeutic drug level monitoring: Secondary | ICD-10-CM | POA: Diagnosis not present

## 2023-10-30 DIAGNOSIS — N319 Neuromuscular dysfunction of bladder, unspecified: Secondary | ICD-10-CM | POA: Insufficient documentation

## 2023-10-30 DIAGNOSIS — K592 Neurogenic bowel, not elsewhere classified: Secondary | ICD-10-CM | POA: Diagnosis present

## 2023-10-30 DIAGNOSIS — R001 Bradycardia, unspecified: Secondary | ICD-10-CM | POA: Diagnosis present

## 2023-10-30 DIAGNOSIS — S343XXD Injury of cauda equina, subsequent encounter: Secondary | ICD-10-CM | POA: Diagnosis not present

## 2023-10-30 DIAGNOSIS — G894 Chronic pain syndrome: Secondary | ICD-10-CM | POA: Diagnosis present

## 2023-10-30 DIAGNOSIS — S134XXD Sprain of ligaments of cervical spine, subsequent encounter: Secondary | ICD-10-CM

## 2023-10-30 DIAGNOSIS — M7918 Myalgia, other site: Secondary | ICD-10-CM | POA: Insufficient documentation

## 2023-10-30 MED ORDER — METHADONE HCL 10 MG PO TABS: 10.0000 mg | ORAL_TABLET | Freq: Three times a day (TID) | ORAL | 0 refills | Status: DC

## 2023-11-04 LAB — DRUG TOX MONITOR 1 W/CONF, ORAL FLD
Alprazolam: NEGATIVE ng/mL (ref <0.50)
Amphetamines: NEGATIVE ng/mL (ref <10)
Barbiturates: NEGATIVE ng/mL (ref <10)
Benzodiazepines: POSITIVE ng/mL — AB (ref <0.50)
Buprenorphine: NEGATIVE ng/mL (ref <0.10)
Chlordiazepoxide: NEGATIVE ng/mL (ref <0.50)
Clonazepam: NEGATIVE ng/mL (ref <0.50)
Cocaine: NEGATIVE ng/mL (ref <5.0)
Diazepam: 0.53 ng/mL — ABNORMAL HIGH (ref <0.50)
EDDP: NEGATIVE ng/mL (ref <5.0)
Fentanyl: NEGATIVE ng/mL (ref <0.10)
Flunitrazepam: NEGATIVE ng/mL (ref <0.50)
Flurazepam: NEGATIVE ng/mL (ref <0.50)
Heroin Metabolite: NEGATIVE ng/mL (ref <1.0)
Lorazepam: NEGATIVE ng/mL (ref <0.50)
MARIJUANA: NEGATIVE ng/mL (ref <2.5)
MDMA: NEGATIVE ng/mL (ref <10)
Meprobamate: NEGATIVE ng/mL (ref <2.5)
Methadone: 135.4 ng/mL — ABNORMAL HIGH (ref <5.0)
Methadone: POSITIVE ng/mL — AB (ref <5.0)
Midazolam: NEGATIVE ng/mL (ref <0.50)
Nicotine Metabolite: NEGATIVE ng/mL (ref <5.0)
Nordiazepam: 1.22 ng/mL — ABNORMAL HIGH (ref <0.50)
Opiates: NEGATIVE ng/mL (ref <2.5)
Oxazepam: NEGATIVE ng/mL (ref <0.50)
Phencyclidine: NEGATIVE ng/mL (ref <10)
Tapentadol: NEGATIVE ng/mL (ref <5.0)
Temazepam: NEGATIVE ng/mL (ref <0.50)
Tramadol: NEGATIVE ng/mL (ref <5.0)
Triazolam: NEGATIVE ng/mL (ref <0.50)
Zolpidem: NEGATIVE ng/mL (ref <5.0)

## 2023-11-04 LAB — DRUG TOX ALC METAB W/CON, ORAL FLD: Alcohol Metabolite: NEGATIVE ng/mL (ref <25)

## 2023-11-07 ENCOUNTER — Other Ambulatory Visit: Payer: Self-pay | Admitting: Physical Medicine and Rehabilitation

## 2023-11-08 DIAGNOSIS — M2041 Other hammer toe(s) (acquired), right foot: Secondary | ICD-10-CM | POA: Diagnosis not present

## 2023-11-09 ENCOUNTER — Encounter: Payer: Medicare Other | Admitting: Podiatry

## 2023-11-09 ENCOUNTER — Telehealth: Payer: Self-pay

## 2023-11-09 MED ORDER — HYDROCODONE-ACETAMINOPHEN 10-325 MG PO TABS: 1.0000 | ORAL_TABLET | Freq: Three times a day (TID) | ORAL | 0 refills | Status: AC | PRN

## 2023-11-09 NOTE — Telephone Encounter (Signed)
Patient called - needs to know if he can take the following meds in the morning before surgery: Methadone Gabapentin Acid reflux med Promethazine  Please call to advise

## 2023-11-09 NOTE — Telephone Encounter (Signed)
Probably better to bring them and take after procedure

## 2023-11-09 NOTE — Addendum Note (Signed)
Addended by: Lenn Sink on: 11/09/2023 01:58 PM   Modules accepted: Orders

## 2023-11-10 DIAGNOSIS — M2041 Other hammer toe(s) (acquired), right foot: Secondary | ICD-10-CM | POA: Diagnosis not present

## 2023-11-11 ENCOUNTER — Encounter: Payer: Self-pay | Admitting: Physical Medicine and Rehabilitation

## 2023-11-11 MED ORDER — OXYCODONE HCL 5 MG PO TABS: 5.0000 mg | ORAL_TABLET | ORAL | 0 refills | Status: DC | PRN

## 2023-11-16 ENCOUNTER — Ambulatory Visit (INDEPENDENT_AMBULATORY_CARE_PROVIDER_SITE_OTHER): Payer: Medicare Other

## 2023-11-16 ENCOUNTER — Encounter: Payer: Self-pay | Admitting: Podiatry

## 2023-11-16 ENCOUNTER — Ambulatory Visit (INDEPENDENT_AMBULATORY_CARE_PROVIDER_SITE_OTHER): Payer: Medicare Other | Admitting: Podiatry

## 2023-11-16 DIAGNOSIS — M2042 Other hammer toe(s) (acquired), left foot: Secondary | ICD-10-CM | POA: Diagnosis not present

## 2023-11-16 DIAGNOSIS — Z9889 Other specified postprocedural states: Secondary | ICD-10-CM | POA: Diagnosis not present

## 2023-11-17 NOTE — Progress Notes (Signed)
Subjective:   Patient ID: Louis Jordan, male   DOB: 67 y.o.   MRN: [id number]   HPI Patient states he is doing well and pleased so far with results   ROS      Objective:  Physical Exam  Neurovascular status intact negative Denna Haggard' sign was noted pins are intact digits 234 left slight small blistering plantar aspect digit to left but localized with no proximal edema erythema drainage noted with all incision sites healing well     Assessment:  Doing well post digital fusion of digits plus hallux fusion plus arthroplasty     Plan:  H&P reviewed all conditions and recommended the continuation of compression dressing which was reapplied today elevation immobilization and patient will be seen back again 2 weeks suture removal earlier if needed  X-rays indicate pins are in position screw in position first metatarsal good alignment

## 2023-11-23 ENCOUNTER — Encounter: Payer: Medicare Other | Admitting: Podiatry

## 2023-11-25 ENCOUNTER — Other Ambulatory Visit: Payer: Self-pay | Admitting: Physical Medicine and Rehabilitation

## 2023-11-25 ENCOUNTER — Encounter: Payer: Self-pay | Admitting: Podiatry

## 2023-11-30 ENCOUNTER — Encounter: Payer: Self-pay | Admitting: Podiatry

## 2023-11-30 ENCOUNTER — Ambulatory Visit (INDEPENDENT_AMBULATORY_CARE_PROVIDER_SITE_OTHER): Payer: Medicare Other

## 2023-11-30 ENCOUNTER — Ambulatory Visit (INDEPENDENT_AMBULATORY_CARE_PROVIDER_SITE_OTHER): Payer: Medicare Other | Admitting: Podiatry

## 2023-11-30 DIAGNOSIS — Z9889 Other specified postprocedural states: Secondary | ICD-10-CM

## 2023-11-30 DIAGNOSIS — M2042 Other hammer toe(s) (acquired), left foot: Secondary | ICD-10-CM | POA: Diagnosis not present

## 2023-12-02 NOTE — Progress Notes (Signed)
Subjective:   Patient ID: Louis Jordan, male   DOB: 67 y.o.   MRN: [id number]   HPI Patient states he is doing very well very pleased the pin of the second toe has moved out and probably was traumatized    ROS      Objective:  Physical Exam  Neurovascular status was found to be intact negative Denna Haggard' sign was noted with the patient found to have the pin in the second toe that has moved out by about 1 inch with the other pins intact all stitches intact good alignment of everything noted     Assessment:  Overall doing very well postoperatively with the pin second digit which is gotten loose sore was traumatized everything else in excellent alignment     Plan:  H&P x-ray taken pins removed second digit left sterile dressing applied stitches removed at this time continue with shoe usage and of a surgical nature and patient will be seen back again in 2 weeks for remainder pins to be removed but overall doing well  X-rays indicate that the structure looks good the fixation good in the toes in good alignment screw in place first metatarsal hallux

## 2023-12-14 ENCOUNTER — Ambulatory Visit (INDEPENDENT_AMBULATORY_CARE_PROVIDER_SITE_OTHER): Payer: Medicare Other

## 2023-12-14 ENCOUNTER — Encounter: Payer: Self-pay | Admitting: Podiatry

## 2023-12-14 ENCOUNTER — Ambulatory Visit (INDEPENDENT_AMBULATORY_CARE_PROVIDER_SITE_OTHER): Payer: Medicare Other | Admitting: Podiatry

## 2023-12-14 DIAGNOSIS — Z9889 Other specified postprocedural states: Secondary | ICD-10-CM

## 2023-12-14 DIAGNOSIS — M2042 Other hammer toe(s) (acquired), left foot: Secondary | ICD-10-CM

## 2023-12-14 NOTE — Progress Notes (Signed)
Subjective:   Patient ID: Louis Jordan, male   DOB: 67 y.o.   MRN: [id number]   HPI Patient states doing very well so far and would like to get the other foot organ to wait a little while neuro   ROS      Objective:  Physical Exam  Ocular status intact negative Denna Haggard' sign noted wound edges coapted well pins intact digits 234 left     Assessment:  Doing well post digital fusion digits 234 left     Plan:  H&P reviewed pins removed sterile dressings applied to the toes and remaining stitches removed.  At this point x-rays taken indicating excellent alignment screw in place left hallux patient will be seen back to recheck all questions answered very pleased so far

## 2023-12-18 ENCOUNTER — Ambulatory Visit: Payer: Medicare Other | Admitting: Physical Medicine and Rehabilitation

## 2023-12-20 ENCOUNTER — Encounter: Payer: Self-pay | Admitting: Podiatry

## 2023-12-29 ENCOUNTER — Encounter: Payer: Self-pay | Admitting: Physical Medicine and Rehabilitation

## 2023-12-30 NOTE — Telephone Encounter (Signed)
  Having problems with sitting up blood- in AM.  Got CT- of chest- has stage 1 lung CA.  Going in 01/21/24- for removal of cancer-  Will be in hospital 4-5 days- will see me 2/24-  I explained can do by virtual visit if need be, but would rather see in person.    Let him know I'm thinking about him and call me if he needs me.

## 2024-01-08 ENCOUNTER — Ambulatory Visit: Payer: Medicare Other | Admitting: Physical Medicine and Rehabilitation

## 2024-01-11 ENCOUNTER — Ambulatory Visit (INDEPENDENT_AMBULATORY_CARE_PROVIDER_SITE_OTHER): Payer: Medicare Other | Admitting: Podiatry

## 2024-01-11 ENCOUNTER — Ambulatory Visit (INDEPENDENT_AMBULATORY_CARE_PROVIDER_SITE_OTHER): Payer: Medicare Other

## 2024-01-11 DIAGNOSIS — Z9889 Other specified postprocedural states: Secondary | ICD-10-CM

## 2024-01-13 NOTE — Progress Notes (Signed)
Subjective:   Patient ID: Louis Jordan, male   DOB: 68 y.o.   MRN: [id number]   HPI Patient states left foot doing great and needs to have the right one done but has a spot on his lung and until that is taken care of beginning of February he cannot do it   ROS      Objective:  Physical Exam  Neuro vascular status intact wound edges coapted well with good alignment of digits left foot     Assessment:  Doing well digital fusion left digits     Plan:  Xray reviewed and resue normal walking on the left foot. Surgery on right when he is recovered from lung surgery. Xray shows excellent healing digits of left foot

## 2024-01-18 ENCOUNTER — Telehealth: Payer: Self-pay | Admitting: Physical Medicine and Rehabilitation

## 2024-01-18 NOTE — Telephone Encounter (Signed)
Patient going in for surgery on Thursday in [facility] for Lung Cancer.

## 2024-02-05 ENCOUNTER — Encounter: Payer: Self-pay | Admitting: Physical Medicine and Rehabilitation

## 2024-02-08 ENCOUNTER — Encounter: Payer: Medicare Other | Admitting: Physical Medicine and Rehabilitation

## 2024-02-17 ENCOUNTER — Encounter: Payer: Self-pay | Admitting: Physical Medicine and Rehabilitation

## 2024-02-18 ENCOUNTER — Telehealth: Payer: Self-pay | Admitting: Physical Medicine and Rehabilitation

## 2024-02-18 NOTE — Telephone Encounter (Signed)
 Pts wife called and stated pt is still in hospital and she would like to speak to dr lovorn about rehab and records.

## 2024-02-19 ENCOUNTER — Encounter: Payer: Self-pay | Admitting: Physical Medicine and Rehabilitation

## 2024-02-26 ENCOUNTER — Encounter: Payer: Medicare Other | Admitting: Registered Nurse

## 2024-03-08 ENCOUNTER — Encounter: Payer: Self-pay | Admitting: Physical Medicine and Rehabilitation

## 2024-03-16 ENCOUNTER — Encounter: Payer: Self-pay | Admitting: Physical Medicine and Rehabilitation

## 2024-03-16 ENCOUNTER — Encounter: Attending: Physical Medicine and Rehabilitation | Admitting: Physical Medicine and Rehabilitation

## 2024-03-16 VITALS — BP 104/69 | HR 62 | Ht 72.0 in | Wt 128.0 lb

## 2024-03-16 DIAGNOSIS — R634 Abnormal weight loss: Secondary | ICD-10-CM | POA: Diagnosis present

## 2024-03-16 DIAGNOSIS — S343XXS Injury of cauda equina, sequela: Secondary | ICD-10-CM | POA: Diagnosis present

## 2024-03-16 DIAGNOSIS — G834 Cauda equina syndrome: Secondary | ICD-10-CM

## 2024-03-16 DIAGNOSIS — R112 Nausea with vomiting, unspecified: Secondary | ICD-10-CM

## 2024-03-16 DIAGNOSIS — M7918 Myalgia, other site: Secondary | ICD-10-CM

## 2024-03-16 DIAGNOSIS — G894 Chronic pain syndrome: Secondary | ICD-10-CM | POA: Diagnosis present

## 2024-03-16 MED ORDER — METHADONE HCL 10 MG PO TABS: 10.0000 mg | ORAL_TABLET | Freq: Three times a day (TID) | ORAL | 0 refills | Status: DC

## 2024-03-16 MED ORDER — LIDOCAINE HCL 1 % IJ SOLN: 3.0000 mL | Freq: Once | INTRAMUSCULAR | Status: AC

## 2024-03-16 MED ORDER — GABAPENTIN 600 MG PO TABS: 1200.0000 mg | ORAL_TABLET | Freq: Three times a day (TID) | ORAL | 1 refills | Status: DC

## 2024-03-16 NOTE — Progress Notes (Signed)
 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 Also impaired balance due to foot atrophy  Also has hx of C5/-7 plate and screws.    Here for f/u on chronic pain and Cauda equina syndrome    Was in hospital for lung collapse- 5 weeks of hospital - got out 02/25/24.   Lost a lot of weight- was 142-144 lbs- and now 128 lbs. Was 119 lbs when left hospital. And even got dehydrated last few days of hospital.   Thought was going to die- had resp failure and septic shock and double pneumonia.   Today is 5th good day in a row.  Can eat and gain weight.   Had a fever last week 99.6 but then good days since then.   Woke up this AM was 99.6 this AM and now gone- but was sick to stomach.   Gave him Oxy in hospital for breakthrough pain- hasn't taken in 7 days- got off as quick as could.   Still on Methadone- 10 mg TID but taking BID since it sounds like I somehow sent in prior to increase.    Neck is bothering him- where traps are- so wants trigger point injections.   Been taking Phenergan- 1x/day-  Saw GI doc last week-  said not emptying the entirely- taking meds for GERD- nausea was likely due to  food not digested - since has gastric emptying- put on Reglan-  liquid diarrhea- so many accidents. Couldn't keep stool controlled.  Did stool samples- dropped off yesterday- and a CT scan f abdomen with contrast- esp due to Pancretic enzymes he takes. Might do Colonoscopy - would be due early.  (Hx of dad with pancreatic CA).   Couldn't tolerate Reglan.    They did endoscopy in hospital- everything was OK.    Got back in PT- didn't go today-  2x/week- Audra in [facility].     Exam: Awake,alert; accompanied by wife, NAD L foot has STRAIGHT toes compared to R foot- where toes are  still curled significantly- no increased tone.   Trigger points in upper traps, levators and scalenes.   Plan: Will continue Methadone 10 mg 3x/day- send in another Rx- just got it filled-    2. Go back to Dr Charlsie Merles- for R foot to have surgery to help with straightening toes.    3. Patient here for trigger point injections for  Consent done and on chart.  Cleaned areas with alcohol and injected using a 27 gauge 1.5 inch needle  Injected 3cc- none wasted Using 1% Lidocaine with no EPI  Upper traps  B/L  Levators- b/L  Posterior scalenes Middle scalenes- B/L  Splenius Capitus Pectoralis Major Rhomboids Infraspinatus Teres Major/minor Thoracic paraspinals Lumbar paraspinals Other injections-     There was no bleeding or complications.  Patient was advised to drink a lot of water on day after injections to flush system Will have increased soreness for 12-48 hours after injections.  Can use Lidocaine patches the day AFTER injections Can use theracane on day of injections in places didn't inject Can use heating pad 4-6 hours AFTER injections  4. If has another low grade temp, and still feels bad, have PCP check WBC/CBC and do ID work up.    5. Has refills on  Sertraline- con't regimen  6. Con't Gabapentin 1200 mg TID- for nerve pain-  6 months supply sent   7. F/U in with Riley Lam in 2 months and me in 4 months-     I spent a total of 36   minutes on total care today- >50% coordination of care- due to 6 minute son injections- rest talking about almost death experience per pt with 5 week sof hospitalization.

## 2024-03-16 NOTE — Patient Instructions (Signed)
 Plan: Will continue Methadone 10 mg 3x/day- send in another Rx- just got it filled-    2. Go back to Dr Charlsie Merles- for R foot to have surgery to help with straightening toes.    3. Patient here for trigger point injections for  Consent done and on chart.  Cleaned areas with alcohol and injected using a 27 gauge 1.5 inch needle  Injected 3cc- none wasted Using 1% Lidocaine with no EPI  Upper traps  B/L  Levators- b/L  Posterior scalenes Middle scalenes- B/L  Splenius Capitus Pectoralis Major Rhomboids Infraspinatus Teres Major/minor Thoracic paraspinals Lumbar paraspinals Other injections-     There was no bleeding or complications.  Patient was advised to drink a lot of water on day after injections to flush system Will have increased soreness for 12-48 hours after injections.  Can use Lidocaine patches the day AFTER injections Can use theracane on day of injections in places didn't inject Can use heating pad 4-6 hours AFTER injections  4. If has another low grade temp, and still feels bad, have PCP check WBC/CBC and do ID work up.    5. Has refills on Sertraline- con't regimen  6. Con't Gabapentin 1200 mg TID- for nerve pain-  6 months supply sent   7. F/U in with Riley Lam in 2 months and me in 4 months-

## 2024-03-25 ENCOUNTER — Ambulatory Visit: Payer: Medicare Other | Admitting: Physical Medicine and Rehabilitation

## 2024-04-15 ENCOUNTER — Ambulatory Visit: Payer: Medicare Other | Admitting: Physical Medicine and Rehabilitation

## 2024-04-27 ENCOUNTER — Ambulatory Visit: Payer: Medicare Other | Admitting: Registered Nurse

## 2024-05-16 ENCOUNTER — Encounter: Attending: Physical Medicine and Rehabilitation | Admitting: Registered Nurse

## 2024-05-16 ENCOUNTER — Encounter: Payer: Self-pay | Admitting: Registered Nurse

## 2024-05-16 VITALS — BP 114/75 | HR 59 | Ht 72.0 in | Wt 134.6 lb

## 2024-05-16 DIAGNOSIS — Z5181 Encounter for therapeutic drug level monitoring: Secondary | ICD-10-CM

## 2024-05-16 DIAGNOSIS — S343XXS Injury of cauda equina, sequela: Secondary | ICD-10-CM | POA: Diagnosis present

## 2024-05-16 DIAGNOSIS — N319 Neuromuscular dysfunction of bladder, unspecified: Secondary | ICD-10-CM | POA: Diagnosis not present

## 2024-05-16 DIAGNOSIS — G894 Chronic pain syndrome: Secondary | ICD-10-CM

## 2024-05-16 DIAGNOSIS — S343XXD Injury of cauda equina, subsequent encounter: Secondary | ICD-10-CM

## 2024-05-16 DIAGNOSIS — Z79891 Long term (current) use of opiate analgesic: Secondary | ICD-10-CM | POA: Diagnosis not present

## 2024-05-16 MED ORDER — METHADONE HCL 10 MG PO TABS: 10.0000 mg | ORAL_TABLET | Freq: Three times a day (TID) | ORAL | 0 refills | Status: DC

## 2024-05-16 NOTE — Progress Notes (Addendum)
 Subjective:    Patient ID: Louis Jordan, male    DOB: 1956/03/27, 68 y.o.   MRN: [id number]  HPI: Louis Jordan is a 68 y.o. male who returns for follow up appointment for chronic pain and medication refill. He states his pain is located in his lower back radiating into his bilateral lower extremities and bilateral feet with tingling and burning. He rates his pain 7. His current exercise regime is attending physical therapy weekly, walking and performing stretching exercises.  Mr. Warnell Morphine  equivalent is 141.00 MME.  He is also prescribed Diazepam  by Acie Acosta  .We have discussed the black box warning of using opioids and benzodiazepines. I highlighted the dangers of using these drugs together and discussed the adverse events including respiratory suppression, overdose, cognitive impairment and importance of compliance with current regimen. We will continue to monitor and adjust as indicated.     Oral Swab was Performed today.    Pain Inventory Average Pain 7 Pain Right Now 7 My pain is burning, dull, and aching  In the last 24 hours, has pain interfered with the following? General activity 5 Relation with others 5 Enjoyment of life 9 What TIME of day is your pain at its worst? morning  Sleep (in general) Good  Pain is worse with: bending, sitting, and standing Pain improves with: medication Relief from Meds: 5  Family History  Problem Relation Age of Onset   Heart disease Mother    Alzheimer's disease Mother    Pancreatic cancer Father    High blood pressure Brother    Social History   Socioeconomic History   Marital status: Married    Spouse name: Not on file   Number of children: Not on file   Years of education: Not on file   Highest education level: Not on file  Occupational History   Not on file  Tobacco Use   Smoking status: Never   Smokeless tobacco: Never  Vaping Use   Vaping status: Never Used  Substance and Sexual Activity   Alcohol use:  Not Currently   Drug use: Never   Sexual activity: Not on file  Other Topics Concern   Not on file  Social History Narrative   Not on file   Social Drivers of Health   Financial Resource Strain: Low Risk  (03/28/2020)   [facility] visits prior to 02/14/2023., [facility] visits prior to 02/14/2023.   Overall Financial Resource Strain (CARDIA)    Difficulty of Paying Living Expenses: Not hard at all  Food Insecurity: Low Risk  (04/22/2024)   [facility]   Hunger Vital Sign    Worried About Running Out of Food in the Last Year: Never true    Ran Out of Food in the Last Year: Never true  Transportation Needs: No Transportation Needs (04/22/2024)   Received from [employer]    In the past 12 months, has lack of reliable transportation kept you from medical appointments, meetings, work or from getting things needed for daily living? : No  Physical Activity: Sufficiently Active (12/31/2021)   [facility] visits prior to 02/14/2023., [facility] visits prior to 02/14/2023.   Exercise Vital Sign    Days of Exercise per Week: 3 days    Minutes of Exercise per Session: 50 min  Stress: No Stress Concern Present (03/28/2020)   [facility] visits prior to 02/14/2023., [facility] visits prior to 02/14/2023.   [name]    Feeling of Stress : Not at all  Social Connections: Unknown (04/22/2022)   Received from [facility]   Social Network    Social Network: Not on file   Past Surgical History:  Procedure Laterality Date   APPENDECTOMY  1964   BOTOX  INJECTION N/A 02/28/2020   Procedure: CYSTOSCOPY BOTOX  100 UNITS INJECTION;  Surgeon: Erman Hayward, MD;  Location: [facility];  Service: Urology;   Laterality: N/A;   CERVICAL FUSION  2000   c 5 to c 7    EYE SURGERY Bilateral 1962   eye muscle sx   INSERTION OF SUPRAPUBIC CATHETER N/A 02/28/2020   Procedure: INSERTION OF SUPRAPUBIC CATHETER;  Surgeon: Erman Hayward, MD;  Location: [facility];  Service: Urology;  Laterality: N/A;   KNEE SURGERY Right    LUMBAR SPINE SURGERY  10/16/2019   L1 burst fracture with epidural hematoma and canal stenosis   OPEN REDUCTION INTERNAL FIXATION (ORIF) TIBIA/FIBULA FRACTURE Left 10/19/2019   Procedure: OPEN REDUCTION INTERNAL FIXATION (ORIF) TIBIA/FIBULA FRACTURE;  Surgeon: Laneta Pintos, MD;  Location: [facility] OR;  Service: Orthopedics;  Laterality: Left;   PROSTATE SURGERY N/A 06/24/2021   [facility] Lem Pyle at [facility] ARTHROSCOPY WITH BANKART REPAIR Left 10/19/2019   Procedure: SHOULDER ARTHROSCOPY WITH BANKART REPAIR, EXTENSIVE DEBRIDEMENT, LOOSE BODY REMOVAL;  Surgeon: Micheline Ahr, MD;  Location: [facility] OR;  Service: Orthopedics;  Laterality: Left;   STRABISMUS SURGERY     TONSILLECTOMY     Past Surgical History:  Procedure Laterality Date   APPENDECTOMY  1964   BOTOX  INJECTION N/A 02/28/2020   Procedure: CYSTOSCOPY BOTOX  100 UNITS INJECTION;  Surgeon: Erman Hayward, MD;  Location: [facility];  Service: Urology;  Laterality: N/A;   CERVICAL FUSION  2000   c 5 to c 7    EYE SURGERY Bilateral 1962   eye muscle sx   INSERTION OF SUPRAPUBIC CATHETER N/A 02/28/2020   Procedure: INSERTION OF SUPRAPUBIC CATHETER;  Surgeon: Erman Hayward, MD;  Location: [facility] ;  Service: Urology;  Laterality: N/A;   KNEE SURGERY Right    LUMBAR SPINE SURGERY  10/16/2019   L1 burst fracture with epidural hematoma and canal stenosis   OPEN REDUCTION INTERNAL FIXATION (ORIF) TIBIA/FIBULA FRACTURE Left 10/19/2019   Procedure: OPEN REDUCTION INTERNAL FIXATION (ORIF) TIBIA/FIBULA FRACTURE;  Surgeon: Laneta Pintos, MD;  Location: [facility];  Service:  Orthopedics;  Laterality: Left;   PROSTATE SURGERY N/A 06/24/2021   Dr. Lem Pyle at [facility] ARTHROSCOPY WITH BANKART REPAIR Left 10/19/2019   Procedure: SHOULDER ARTHROSCOPY WITH BANKART REPAIR, EXTENSIVE DEBRIDEMENT, LOOSE BODY REMOVAL;  Surgeon: Micheline Ahr, MD;  Location: [facility] OR;  Service: Orthopedics;  Laterality: Left;   STRABISMUS SURGERY     TONSILLECTOMY     Past Medical History:  Diagnosis Date   Anemia    Arthritis    Bundle branch block, right    Closed left ankle fracture    Complication of anesthesia    Compression fracture of lumbar vertebrae, non-traumatic (HCC)    Foley catheter in place    last changed 2 weeks ago   GERD (gastroesophageal reflux disease)    Hand pain    mild OA bilateral  hands   Left knee injury 1976  requiring surgery for repair of ligaments/cartilage damage with nerve injury that took 20 years to resolve   MVP (mitral valve prolapse)    mild no cardiologist   Neurogenic bladder    Neurogenic bowel    PONV (postoperative nausea and vomiting)    Spinal cord injury at T7-T12 level (HCC) 10/15/2019   due to accident 10-15-2019 fell 18 feet on concrete   BP 114/75 (BP Location: Left Arm, Patient Position: Sitting, Cuff Size: Normal)   Pulse (!) 59   Ht 6' (1.829 m)   Wt 134 lb 9.6 oz (61.1 kg)   SpO2 95%   BMI 18.26 kg/m   Opioid Risk Score:   Fall Risk Score:  `1  Depression screen [facility] 2/9     05/16/2024   11:49 AM 10/30/2023   11:11 AM 09/18/2023   11:18 AM 03/18/2023   11:30 AM 12/17/2022   11:32 AM 09/08/2022    1:59 PM 05/16/2022    9:40 AM  Depression screen PHQ 2/9  Decreased Interest 1 0 1 0 0 0 0  Down, Depressed, Hopeless 0 0 1 0 0 0   PHQ - 2 Score 1 0 2 0 0 0 0      Review of Systems  Musculoskeletal:  Positive for myalgias.       Pain is from waist down to feet  All other systems reviewed and are negative.      Objective:   Physical Exam Vitals and nursing note reviewed.  Constitutional:       Appearance: Normal appearance.  Cardiovascular:     Rate and Rhythm: Bradycardia present.     Pulses: Normal pulses.     Heart sounds: Normal heart sounds.  Pulmonary:     Effort: Pulmonary effort is normal.     Breath sounds: Normal breath sounds.  Genitourinary:    Comments: Supra Pubic Catheter: Capped Musculoskeletal:     Comments: Normal Muscle Bulk and Muscle Testing Reveals:  Upper Extremities: Full ROM and Muscle Strength 5/5  Lower Extremities: Full ROM and Muscle Strength 5/5 Arises from Table slowly  Narrow based Gait     Skin:    General: Skin is warm and dry.  Neurological:     Mental Status: He is alert and oriented to person, place, and time.  Psychiatric:        Mood and Affect: Mood normal.        Behavior: Behavior normal.           Assessment & Plan:  Cauda Equina Spinal Cord Injury: Continue current medication regimen. Continue to Monitor, 05/16/2024 2. Neurogenic Bowel and Neurogenic Bladder: Continue Bowel and Bladder Program. Continue to Monitor. 05/16/2024 3.. Myofascial Pain : Continue Baclofen  as ordered. He is scheduled with Dr Lovorn for Trigger Point Injection. He verbalizes understanding. 05/16/2024 4. Bradycardia: Mr. Evitts will F/U with PCP. He verbalizes understanding. 05/16/2024 5. Chronic Pain Syndrome: Continue Gabapentin . Refilled. Methadone  10 mg one table every 8 hours as needed for pain #90. Second script sent to accommodate scheduled appointment. We will continue the opioid monitoring program, this consists of regular clinic visits, examinations, urine drug screen, pill counts as well as use of [place]  Controlled Substance Reporting system. A 12 month History has been reviewed on the [place]  Controlled Substance Reporting System on 05/16/2024   F/U in 2 months

## 2024-05-20 LAB — DRUG TOX MONITOR 1 W/CONF, ORAL FLD
Alprazolam: NEGATIVE ng/mL (ref <0.50)
Aminoclonazepam: NEGATIVE ng/mL (ref <0.50)
Amphetamines: NEGATIVE ng/mL (ref <10)
Barbiturates: NEGATIVE ng/mL (ref <10)
Benzodiazepines: POSITIVE ng/mL — AB (ref <0.50)
Buprenorphine: NEGATIVE ng/mL (ref <0.10)
Chlordiazepoxide: NEGATIVE ng/mL (ref <0.50)
Clonazepam: NEGATIVE ng/mL (ref <0.50)
Cocaine: NEGATIVE ng/mL (ref <5.0)
Diazepam: NEGATIVE ng/mL (ref <0.50)
EDDP: NEGATIVE ng/mL (ref <5.0)
Fentanyl: NEGATIVE ng/mL (ref <0.10)
Flunitrazepam: NEGATIVE ng/mL (ref <0.50)
Flurazepam: NEGATIVE ng/mL (ref <0.50)
Heroin Metabolite: NEGATIVE ng/mL (ref <1.0)
Lorazepam: NEGATIVE ng/mL (ref <0.50)
MARIJUANA: NEGATIVE ng/mL (ref <2.5)
MDMA: NEGATIVE ng/mL (ref <10)
Meprobamate: NEGATIVE ng/mL (ref <2.5)
Methadone: 47.3 ng/mL — ABNORMAL HIGH (ref <5.0)
Methadone: POSITIVE ng/mL — AB (ref <5.0)
Midazolam: NEGATIVE ng/mL (ref <0.50)
Nicotine Metabolite: NEGATIVE ng/mL (ref <5.0)
Nordiazepam: 1 ng/mL — ABNORMAL HIGH (ref <0.50)
Opiates: NEGATIVE ng/mL (ref <2.5)
Oxazepam: NEGATIVE ng/mL (ref <0.50)
Phencyclidine: NEGATIVE ng/mL (ref <10)
Tapentadol: NEGATIVE ng/mL (ref <5.0)
Temazepam: NEGATIVE ng/mL (ref <0.50)
Tramadol: NEGATIVE ng/mL (ref <5.0)
Triazolam: NEGATIVE ng/mL (ref <0.50)
Zolpidem: NEGATIVE ng/mL (ref <5.0)

## 2024-05-20 LAB — DRUG TOX ALC METAB W/CON, ORAL FLD: Alcohol Metabolite: NEGATIVE ng/mL (ref <25)

## 2024-06-08 ENCOUNTER — Ambulatory Visit (INDEPENDENT_AMBULATORY_CARE_PROVIDER_SITE_OTHER)

## 2024-06-08 ENCOUNTER — Encounter: Payer: Self-pay | Admitting: Podiatry

## 2024-06-08 ENCOUNTER — Ambulatory Visit: Admitting: Podiatry

## 2024-06-08 VITALS — Ht 72.0 in | Wt 135.0 lb

## 2024-06-08 DIAGNOSIS — M2042 Other hammer toe(s) (acquired), left foot: Secondary | ICD-10-CM

## 2024-06-08 DIAGNOSIS — M2041 Other hammer toe(s) (acquired), right foot: Secondary | ICD-10-CM

## 2024-06-09 NOTE — Progress Notes (Signed)
 Subjective:   Patient ID: Louis Jordan, male   DOB: 68 y.o.   MRN: [id number]   HPI Patient states he is doing great with his left foot and he is ready to have his right foot fixed   ROS      Objective:  Physical Exam  Neurovascular status intact muscle strength found to be adequate range of motion adequate with the patient found to have excellent position of the hallux second third fourth fifth toes left and it made a big difference in stability and on the right he has got severe hallux interphalange ES along with hammertoe deformity digits 2345.  He did have pneumonia but has recovered well from that and has had no other changes in health history with good digital perfusion     Assessment:  Excellent recovery from surgery left with severe digital deformities of the right      Plan:  H&P and I at this time allow him to read the consent form for correction of the right foot with fusion of the right big toe with screw and digital fusion digits 234 arthroplasty digit 5.  Patient wants surgery and I explained the recovery to him and at this point he is scheduled for outpatient surgery with all questions answered and he will do this in September understanding risk recovery and just because 1 foot did so well there is no guarantee that the other 1 will  X-rays indicate that there is excellent healing left good alignment noted screw in place digits in very good alignment

## 2024-06-13 ENCOUNTER — Ambulatory Visit: Admitting: Podiatry

## 2024-06-15 ENCOUNTER — Telehealth: Payer: Self-pay | Admitting: Podiatry

## 2024-06-15 NOTE — Telephone Encounter (Signed)
 Received surgical consent for for surgery in September   Left message for pt to call to get his surgery scheduled.

## 2024-06-27 ENCOUNTER — Ambulatory Visit: Payer: Medicare Other | Admitting: Registered Nurse

## 2024-07-20 ENCOUNTER — Encounter: Payer: Self-pay | Admitting: Physical Medicine and Rehabilitation

## 2024-07-20 ENCOUNTER — Encounter: Attending: Physical Medicine and Rehabilitation | Admitting: Physical Medicine and Rehabilitation

## 2024-07-20 VITALS — BP 125/76 | HR 53 | Ht 72.0 in | Wt 139.0 lb

## 2024-07-20 DIAGNOSIS — M7918 Myalgia, other site: Secondary | ICD-10-CM | POA: Diagnosis not present

## 2024-07-20 DIAGNOSIS — S343XXD Injury of cauda equina, subsequent encounter: Secondary | ICD-10-CM | POA: Diagnosis not present

## 2024-07-20 DIAGNOSIS — M792 Neuralgia and neuritis, unspecified: Secondary | ICD-10-CM | POA: Diagnosis not present

## 2024-07-20 DIAGNOSIS — G894 Chronic pain syndrome: Secondary | ICD-10-CM

## 2024-07-20 DIAGNOSIS — N319 Neuromuscular dysfunction of bladder, unspecified: Secondary | ICD-10-CM | POA: Diagnosis not present

## 2024-07-20 DIAGNOSIS — S343XXS Injury of cauda equina, sequela: Secondary | ICD-10-CM | POA: Diagnosis present

## 2024-07-20 MED ORDER — PROMETHAZINE HCL 12.5 MG PO TABS: 12.5000 mg | ORAL_TABLET | Freq: Four times a day (QID) | ORAL | 5 refills | Status: AC | PRN

## 2024-07-20 MED ORDER — SERTRALINE HCL 100 MG PO TABS: 150.0000 mg | ORAL_TABLET | Freq: Every day | ORAL | 5 refills | Status: AC

## 2024-07-20 MED ORDER — LIDOCAINE HCL 1 % IJ SOLN
3.0000 mL | Freq: Once | INTRAMUSCULAR | Status: AC
  Administered 2024-07-20: 3 mL

## 2024-07-20 MED ORDER — METHADONE HCL 10 MG PO TABS: 10.0000 mg | ORAL_TABLET | Freq: Three times a day (TID) | ORAL | 0 refills | Status: DC

## 2024-07-20 NOTE — Progress Notes (Signed)
 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 Also impaired balance due to foot atrophy  Also has hx of C5/-7 plate and screws.    Here for f/u on chronic pain and Cauda equina syndrome       Last got Trp Injections in April- lasted for a long time Just noticed the last couple of days has noticed more tightness..   Having to take Phenergan  less often - used to be multiple times per day- now every few weeks.    Was going to do colonoscopy- at the hospital- scheduled 09/19/24.  Was supposed to do in June but only did 1 day prep.   Dr Magdalen- is going to do other foot -seen for Pre-op- end of September-   9/2- penile implant- keep overnight.   Not taking baclofen  - was for feet- but didn't help.   Has Narcan  nasal spray at home- just in case.   Doing 10 mg BID right now- Methadone -   Plan: Con't Methadone  10 mg 3x/day- for chronic pain due to trauma. Wrote 2 Rx's one for now and one for next month- to make until next appt.   Needs refill of Phenergan  -will send in for 90 with 5 refills.    3. Take Methadone  10 mg 3x/day- first dose early in AM- at least 5-6 hours apart.   4. Zoloft  150 mg daily- TAKE IT DAILY!!! Can tell you haven't since it's been 8 months since last refill by me.  Refilled -   5. Con't Gabapentin  - 1200 mg TID- has refills.    6. Patient here for trigger point injections for  Consent done and on chart.  Cleaned areas with alcohol and injected using a 27 gauge 1.5 inch needle  Injected  3cc- none wasted Using 1% Lidocaine  with no EPI  Upper traps B/L x2 Levators- B/L  Posterior scalenes Middle scalenes-B/L  Splenius Capitus Pectoralis Major- b/L  Rhomboids Infraspinatus Teres Major/minor Thoracic paraspinals Lumbar paraspinals Other  injections-    Patient's level of pain prior was 5/10 Current level of pain after injections is- improved ROM- down to 1-2/10  There was no bleeding or complications.  Patient was advised to drink a lot of water  on day after injections to flush system Will have increased soreness for 12-48 hours after injections.  Can use Lidocaine  patches the day AFTER injections Can use theracane on day of injections in places didn't inject Can use heating pad 4-6 hours AFTER injections  7. F/U on chronic pain and SCI- 2 months with Fidela and 4 months with me and trp injections.   I spent a total of  37  minutes on total care today- >50% coordination of care- due to  6 minutes on trp injections- and 31 minutes on d/w pt about pain and how to manage- and refills of meds- also d/w him about what's going on surgically.

## 2024-07-20 NOTE — Patient Instructions (Signed)
 Plan: Con't Methadone  10 mg 3x/day- for chronic pain due to trauma. Wrote 2 Rx's one for now and one for next month- to make until next appt.   Needs refill of Phenergan  -will send in for 90 with 5 refills.    3. Take Methadone  10 mg 3x/day- first dose early in AM- at least 5-6 hours apart.   4. Zoloft  150 mg daily- TAKE IT DAILY!!! Can tell you haven't since it's been 8 months since last refill by me.  Refilled -   5. Con't Gabapentin  - 1200 mg TID- has refills.    6. Patient here for trigger point injections for  Consent done and on chart.  Cleaned areas with alcohol and injected using a 27 gauge 1.5 inch needle  Injected  3cc- none wasted Using 1% Lidocaine  with no EPI  Upper traps B/L x2 Levators- B/L  Posterior scalenes Middle scalenes-B/L  Splenius Capitus Pectoralis Major- b/L  Rhomboids Infraspinatus Teres Major/minor Thoracic paraspinals Lumbar paraspinals Other injections-    Patient's level of pain prior was 5/10 Current level of pain after injections is- improved ROM- down to 1-2/10  There was no bleeding or complications.  Patient was advised to drink a lot of water  on day after injections to flush system Will have increased soreness for 12-48 hours after injections.  Can use Lidocaine  patches the day AFTER injections Can use theracane on day of injections in places didn't inject Can use heating pad 4-6 hours AFTER injections  7. F/U on chronic pain and SCI- 2 months with Fidela and 4 months with me and trp injections.

## 2024-08-11 ENCOUNTER — Telehealth: Payer: Self-pay | Admitting: Podiatry

## 2024-08-11 NOTE — Telephone Encounter (Signed)
 DOS- 12/16/2023  2,3,4+5 HAMMERTOE REPAIR RT- 28285 1ST HALLUX IPJ FUSION RT-  28760  [facility] EFFECTIVE DATE- 12/16/2023  DEDUCTIBLE- $0 REMAINING- $0 OOP- $3900 REMAINING- $0 COINSURANCE- 0%/$190 COPAY  PER [employer] WEBSITE, NO PR

## 2024-08-11 NOTE — Telephone Encounter (Signed)
 DOS- 12/16/2023   2,3,4+5 HAMMERTOE REPAIR RT- 28285 1ST HALLUX IPJ FUSION RT-  28760   [facility] EFFECTIVE DATE- 12/16/2023   DEDUCTIBLE- $0 REMAINING- $0 OOP- $3900 REMAINING- $0 COINSURANCE- 0%/$190 COPAY   PER [employer] WEBSITE, NO PRIOR AUTHS ARE REQUIRED FOR CPT CODES 71714 (4 UNITS) AND 71239. DECISION ID# [id number]

## 2024-08-31 ENCOUNTER — Encounter: Payer: Self-pay | Admitting: Physical Medicine and Rehabilitation

## 2024-09-06 DIAGNOSIS — M2042 Other hammer toe(s) (acquired), left foot: Secondary | ICD-10-CM | POA: Diagnosis not present

## 2024-09-06 DIAGNOSIS — M2011 Hallux valgus (acquired), right foot: Secondary | ICD-10-CM | POA: Diagnosis not present

## 2024-09-06 DIAGNOSIS — M2041 Other hammer toe(s) (acquired), right foot: Secondary | ICD-10-CM | POA: Diagnosis not present

## 2024-09-06 MED ORDER — HYDROCODONE-ACETAMINOPHEN 10-325 MG PO TABS: 1.0000 | ORAL_TABLET | Freq: Three times a day (TID) | ORAL | 0 refills | Status: AC | PRN

## 2024-09-06 NOTE — Addendum Note (Signed)
 Addended by: MAGDALEN PASCO RAMAN on: 09/06/2024 06:54 AM   Modules accepted: Orders

## 2024-09-12 ENCOUNTER — Encounter: Payer: Self-pay | Admitting: Podiatry

## 2024-09-12 ENCOUNTER — Ambulatory Visit

## 2024-09-12 ENCOUNTER — Ambulatory Visit (INDEPENDENT_AMBULATORY_CARE_PROVIDER_SITE_OTHER): Admitting: Podiatry

## 2024-09-12 VITALS — Ht 70.0 in | Wt 139.0 lb

## 2024-09-12 DIAGNOSIS — M2041 Other hammer toe(s) (acquired), right foot: Secondary | ICD-10-CM

## 2024-09-12 DIAGNOSIS — M2042 Other hammer toe(s) (acquired), left foot: Secondary | ICD-10-CM | POA: Diagnosis not present

## 2024-09-12 DIAGNOSIS — Z9889 Other specified postprocedural states: Secondary | ICD-10-CM

## 2024-09-12 MED ORDER — DOXYCYCLINE HYCLATE 100 MG PO TABS: 100.0000 mg | ORAL_TABLET | Freq: Two times a day (BID) | ORAL | 0 refills | Status: DC

## 2024-09-12 NOTE — Progress Notes (Addendum)
 Subjective:   Patient ID: Louis Jordan, male   DOB: 68 y.o.   MRN: [id number]   HPI Patient states doing well with surgery mild discomfort noted   ROS      Objective:  Physical Exam  Neurovascular status intact good alignment digits 12345 right incisions intact wound edges well coapted negative Homans' sign noted with slight redness noted around the right hallux with no drainage     Assessment:  Doing well post digital fusion 1234 right and arthroplasty digit 5 right with slight irritation around the right hallux     Plan:  H&P x-ray reviewed sterile dressing reapplied and instructed on continued surgical shoe usage elevation and reappoint 2 weeks suture removal and new x-ray and all questions answered today.  Due to his relative poor health as precautionary make major I put him on doxycycline for 10 days gave him strict instructions of any changes were to occur any systemic signs of infection to contact us  immediately and to go to the emergency room  X-rays indicate fixation in place toes are in good alignment screw in good alignment first metatarsal interphalangeal joint

## 2024-09-15 NOTE — Telephone Encounter (Signed)
 Please send in antibiotic for this patient. Doxy number 20

## 2024-09-16 ENCOUNTER — Telehealth: Payer: Self-pay | Admitting: Podiatry

## 2024-09-16 MED ORDER — DOXYCYCLINE HYCLATE 100 MG PO TABS: 100.0000 mg | ORAL_TABLET | Freq: Two times a day (BID) | ORAL | 0 refills | Status: DC

## 2024-09-16 NOTE — Telephone Encounter (Signed)
 Called and left message for patient to contact office to reschedule post-op # 2 as Dr.Regal is not in the office that day. Patient will need to be scheduled on nurse #2 schedule as it currently is but at 30 mins not 15 and on Dr. Theron assigned post-op day.

## 2024-09-18 ENCOUNTER — Other Ambulatory Visit: Payer: Self-pay | Admitting: Physical Medicine and Rehabilitation

## 2024-09-19 ENCOUNTER — Encounter

## 2024-09-19 ENCOUNTER — Ambulatory Visit: Admitting: Registered Nurse

## 2024-09-26 ENCOUNTER — Ambulatory Visit (INDEPENDENT_AMBULATORY_CARE_PROVIDER_SITE_OTHER): Payer: Self-pay | Admitting: Podiatry

## 2024-09-26 ENCOUNTER — Ambulatory Visit (INDEPENDENT_AMBULATORY_CARE_PROVIDER_SITE_OTHER)

## 2024-09-26 ENCOUNTER — Encounter: Attending: Physical Medicine and Rehabilitation | Admitting: Registered Nurse

## 2024-09-26 VITALS — BP 94/63 | HR 60 | Ht 72.0 in | Wt 134.0 lb

## 2024-09-26 VITALS — BP 128/69 | HR 60 | Temp 99.0°F

## 2024-09-26 DIAGNOSIS — M545 Low back pain, unspecified: Secondary | ICD-10-CM | POA: Insufficient documentation

## 2024-09-26 DIAGNOSIS — Z5181 Encounter for therapeutic drug level monitoring: Secondary | ICD-10-CM | POA: Insufficient documentation

## 2024-09-26 DIAGNOSIS — G894 Chronic pain syndrome: Secondary | ICD-10-CM | POA: Insufficient documentation

## 2024-09-26 DIAGNOSIS — Z9889 Other specified postprocedural states: Secondary | ICD-10-CM

## 2024-09-26 DIAGNOSIS — N319 Neuromuscular dysfunction of bladder, unspecified: Secondary | ICD-10-CM | POA: Insufficient documentation

## 2024-09-26 DIAGNOSIS — G8929 Other chronic pain: Secondary | ICD-10-CM | POA: Insufficient documentation

## 2024-09-26 DIAGNOSIS — K592 Neurogenic bowel, not elsewhere classified: Secondary | ICD-10-CM | POA: Insufficient documentation

## 2024-09-26 DIAGNOSIS — S343XXS Injury of cauda equina, sequela: Secondary | ICD-10-CM | POA: Diagnosis present

## 2024-09-26 DIAGNOSIS — Z79891 Long term (current) use of opiate analgesic: Secondary | ICD-10-CM | POA: Diagnosis present

## 2024-09-26 MED ORDER — METHADONE HCL 10 MG PO TABS: 10.0000 mg | ORAL_TABLET | Freq: Three times a day (TID) | ORAL | 0 refills | Status: DC

## 2024-09-26 NOTE — Patient Instructions (Signed)

## 2024-09-26 NOTE — Progress Notes (Signed)
 Subjective:   Patient ID: Louis Jordan, male   DOB: 68 y.o.   MRN: [id number]   HPI Patient presents stating doing very well very pleased did develop some sores on the outside of my foot from wearing the surgical shoe   ROS      Objective:  Physical Exam  Patient seen by nurse digits in good alignment stitches intact pins in place     Assessment:  Doing well slight irritation lateral     Plan:  Stitches removed x-rays indicate good alignment pins in place and for the outside of the right dressings applied to cushion the area and will be seen back 2 weeks pin removal earlier if needed at that at that point that should be the last visit for him

## 2024-09-26 NOTE — Progress Notes (Signed)
 Patient presents for post-op visit today, POV # 2 DOS 09/06/24 RT 1ST HALLUX FUSION W SCREW, RT 2,3,4 FUSION W/ PIN, RT 5TH ARTROPLASTY  Doing okay. Has developed two sores on the side of his foot. I do not know how I did it and woke up with them just there..  RN Notes: Patient reports two sores on lateral side of foot over bony prominences. Has been covering these areas with antibiotic ointment and gauze.  Vital Signs: Today's Vitals   09/26/24 1346  BP: 128/69  Pulse: 60  Temp: 99 F (37.2 C)  TempSrc: Oral  PainSc: 2   PainLoc: Foot    Radiographs: [x]  Taken []  Not taken  Surgical Site Assessment:  - Dressing:  [x]  Minimal dry blood, intact []  Reinforced   []  Changed     -RN Notes: n/a  - Incision:  [x]  CDI (clean, dry, intact)  [x]  Mild erythema  []  Drainage noted   -RN Notes: n/a  - Swelling:  []  None  [x]  Mild  []  Moderate   []  Significant    - Bruising:  [x]  None  []  Present: n/a  - Sutures/staples:  [x]  Intact  [x]  Removed Today  []  Plan to remove at next visit    -Cast/Splint/Pins: []  None [x]  Intact  []  Removed [x]  Plan to remove per provider instruction []  Replaced  -Signs of infection:  [x]  None  []  Present - Describe: n/a  -DME:    []  AFW [x]  Surgical shoe []  Cast  []  Splint  -Walking status:  []  Full WB  [x]  Partial WB  []  NWB  -Utilizing device:  []  None []  Knee Scooter [x]  Crutches []  Wheelchair    DVT assessment:  []  Denies symptoms []  Chest pain/SOB []  Pain in calf/redness/warmth   Redressed DSD and ace wrap. Educated on signs of infection, proper dressing care, pain management, and weight bearing status. Patient will contact provider with any new or worsening symptoms. The provider assessed the patient today and reviewed instructions regarding plan of care.
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
## 2024-09-27 ENCOUNTER — Encounter

## 2024-09-27 ENCOUNTER — Encounter: Admitting: Registered Nurse

## 2024-09-27 ENCOUNTER — Encounter: Payer: Self-pay | Admitting: Registered Nurse

## 2024-09-30 ENCOUNTER — Telehealth: Payer: Self-pay | Admitting: Registered Nurse

## 2024-09-30 LAB — DRUG TOX MONITOR 1 W/CONF, ORAL FLD
AMINOCLONAZEPAM: NEGATIVE ng/mL (ref <0.50)
Alprazolam: NEGATIVE ng/mL (ref <0.50)
Amphetamines: NEGATIVE ng/mL (ref <10)
Barbiturates: NEGATIVE ng/mL (ref <10)
Benzodiazepines: POSITIVE ng/mL — AB (ref <0.50)
Buprenorphine: NEGATIVE ng/mL (ref <0.10)
Chlordiazepoxide: NEGATIVE ng/mL (ref <0.50)
Clonazepam: NEGATIVE ng/mL (ref <0.50)
Cocaine: NEGATIVE ng/mL (ref <5.0)
Diazepam: 1.28 ng/mL — ABNORMAL HIGH (ref <0.50)
EDDP: NEGATIVE ng/mL (ref <5.0)
Fentanyl: NEGATIVE ng/mL (ref <0.10)
Flunitrazepam: NEGATIVE ng/mL (ref <0.50)
Flurazepam: NEGATIVE ng/mL (ref <0.50)
Heroin Metabolite: NEGATIVE ng/mL (ref <1.0)
Lorazepam: NEGATIVE ng/mL (ref <0.50)
MARIJUANA: NEGATIVE ng/mL (ref <2.5)
MDMA: NEGATIVE ng/mL (ref <10)
Meprobamate: NEGATIVE ng/mL (ref <2.5)
Methadone: 143.6 ng/mL — ABNORMAL HIGH (ref <5.0)
Methadone: POSITIVE ng/mL — AB (ref <5.0)
Midazolam: NEGATIVE ng/mL (ref <0.50)
Nicotine Metabolite: NEGATIVE ng/mL (ref <5.0)
Nordiazepam: 3.69 ng/mL — ABNORMAL HIGH (ref <0.50)
Opiates: NEGATIVE ng/mL (ref <2.5)
Oxazepam: NEGATIVE ng/mL (ref <0.50)
Phencyclidine: NEGATIVE ng/mL (ref <10)
Tapentadol: NEGATIVE ng/mL (ref <5.0)
Temazepam: NEGATIVE ng/mL (ref <0.50)
Tramadol: NEGATIVE ng/mL (ref <5.0)
Triazolam: NEGATIVE ng/mL (ref <0.50)
Zolpidem: NEGATIVE ng/mL (ref <5.0)

## 2024-09-30 LAB — DRUG TOX ALC METAB W/CON, ORAL FLD: Alcohol Metabolite: NEGATIVE ng/mL (ref <25)

## 2024-10-10 ENCOUNTER — Ambulatory Visit (INDEPENDENT_AMBULATORY_CARE_PROVIDER_SITE_OTHER): Admitting: Podiatry

## 2024-10-10 ENCOUNTER — Ambulatory Visit (INDEPENDENT_AMBULATORY_CARE_PROVIDER_SITE_OTHER)

## 2024-10-10 DIAGNOSIS — M2042 Other hammer toe(s) (acquired), left foot: Secondary | ICD-10-CM

## 2024-10-10 DIAGNOSIS — M2041 Other hammer toe(s) (acquired), right foot: Secondary | ICD-10-CM

## 2024-10-10 NOTE — Progress Notes (Addendum)
 Patient presents for post-op visit today, DOS 09/06/24 RT 1ST HALLUX FUSION W SCREW, RT 2,3,4 FUSION W/ PIN, RT 5TH ARTHROPLASTY  Doing good, no problems..  RN Notes: n/a  Vital Signs: Today's Vitals   10/10/24 1534  PainSc: 0-No pain      Radiographs: [x]  Taken []  Not taken  Surgical Site Assessment:  - Dressing:  []  Minimal dry blood, intact []  Reinforced   []  Changed     -RN Notes: stockinet is the only dressing in place  - Incision:  []  CDI (clean, dry, intact)  []  Mild erythema  []  Drainage noted   -RN Notes: expected healing.  - Swelling:  []  None  [x]  Mild  []  Moderate   []  Significant     -RN Notes: n/a  - Bruising:  [x]  None  []  Present: n/a   - Sutures/Staples:  [x]  None []  Intact  []  Removed Today  []  Plan to remove at next visit   -Cast/Splint/Pins: []  None [x]  Intact [x]  Removed Today []  Plan to remove at next visit []  Replaced  -Signs of infection:  [x]  None  []  Present - Describe: n/a  -DME:    []  None []  AFW [x]  Surgical shoe []  Cast  []  Splint  -Walking status:  [x]  Full WB  []  Partial WB  []  NWB  -Utilizing device:  [x]  Cane []  Knee Scooter []  Crutches []  Wheelchair    DVT assessment:  [x]  Denies symptoms []  Chest pain/SOB []  Pain in calf/redness/warmth   Redressed DSD and ace wrap. Educated on signs of infection, proper dressing care, pain management, and weight bearing status. Patient will contact provider with any new or worsening symptoms. The provider assessed the patient today and reviewed instructions regarding plan of care.

## 2024-10-24 ENCOUNTER — Ambulatory Visit: Admitting: Podiatry

## 2024-10-24 ENCOUNTER — Encounter: Payer: Self-pay | Admitting: Podiatry

## 2024-10-24 ENCOUNTER — Ambulatory Visit (INDEPENDENT_AMBULATORY_CARE_PROVIDER_SITE_OTHER)

## 2024-10-24 DIAGNOSIS — M2041 Other hammer toe(s) (acquired), right foot: Secondary | ICD-10-CM

## 2024-10-24 DIAGNOSIS — M2042 Other hammer toe(s) (acquired), left foot: Secondary | ICD-10-CM

## 2024-10-27 NOTE — Progress Notes (Signed)
 Subjective:   Patient ID: Louis Jordan, male   DOB: 68 y.o.   MRN: [id number]   HPI Patient states overall doing well still have mild swelling   ROS      Objective:  Physical Exam  Neurovascular status intact negative Toula' sign noted wound edges coapted well toes in good alignment     Assessment:  Doing well post digital fusions lesser digits and hallux right and left     Plan:  H&P x-ray left indicated good position good healing screw in place first metatarsal and at this point patient may return to normal activities

## 2024-11-21 ENCOUNTER — Encounter: Payer: Self-pay | Admitting: Physical Medicine and Rehabilitation

## 2024-11-21 ENCOUNTER — Encounter: Attending: Physical Medicine and Rehabilitation | Admitting: Physical Medicine and Rehabilitation

## 2024-11-21 VITALS — BP 136/82 | HR 56 | Ht 72.0 in | Wt 140.0 lb

## 2024-11-21 DIAGNOSIS — N521 Erectile dysfunction due to diseases classified elsewhere: Secondary | ICD-10-CM | POA: Insufficient documentation

## 2024-11-21 DIAGNOSIS — N319 Neuromuscular dysfunction of bladder, unspecified: Secondary | ICD-10-CM | POA: Insufficient documentation

## 2024-11-21 DIAGNOSIS — M7918 Myalgia, other site: Secondary | ICD-10-CM | POA: Insufficient documentation

## 2024-11-21 DIAGNOSIS — S343XXS Injury of cauda equina, sequela: Secondary | ICD-10-CM | POA: Diagnosis present

## 2024-11-21 DIAGNOSIS — S343XXD Injury of cauda equina, subsequent encounter: Secondary | ICD-10-CM | POA: Diagnosis not present

## 2024-11-21 MED ORDER — METHADONE HCL 10 MG PO TABS: 10.0000 mg | ORAL_TABLET | Freq: Three times a day (TID) | ORAL | 0 refills | Status: AC

## 2024-11-21 MED ORDER — LIDOCAINE HCL 1 % IJ SOLN
6.0000 mL | Freq: Once | INTRAMUSCULAR | Status: AC
  Administered 2024-11-21: 6 mL
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
## 2024-11-21 NOTE — Patient Instructions (Signed)
 Plan: Can take Methadone - 3x/day to reduce your pain in AM.   Esp because if you take at 430-5pm, then goes 16-17 hours without meds - so let's try 3x/day.  If it makes no difference, go back to 2x/day.      2. Methadone  10 mg 3x/day- will give 2 Rx's.   3. Try to keep on lowest dose for gabapentin  to keep pain adequately controlled- and reduce chance of  dementia Didn't respond well to Cymbalta  and Lyrica .   4.   I agree with Testosterone- although I don't prescribe. But we also discussed   5.   Discussed intimacy- and  that cauda equina syndrome- especially complete (he has partial) has more chance of not being able to have ejaculation- has sensation- though, but not quite the same-  Discussion of practice.    6. Patient here for trigger point injections for  Consent done and on chart.  Cleaned areas with alcohol and injected using a 27 gauge 1.5 inch needle  Injected 5cc- wasted 1cc-  Using 1% Lidocaine  with no EPI  Upper traps B/L x2 Levators B/L  Posterior scalenes Middle scalenes B/L x2 Splenius Capitus B/L  Pectoralis Major Rhomboids b/L x2 Infraspinatus Teres Major/minor Thoracic paraspinals Lumbar paraspinals Other injections-    Patient's level of pain prior was 6-7/10 Current level of pain after injections is feels more relaxed-   There was no bleeding or complications.  Patient was advised to drink a lot of water  on day after injections to flush system Will have increased soreness for 12-48 hours after injections.  Can use Lidocaine  patches the day AFTER injections Can use theracane on day of injections in places didn't inject Can use heating pad 4-6 hours AFTER injections  7. Con't Zoloft - last refill 07/20/24  8. Con't Phenergan  last refill 07/20/24  9. F/U q 2 months with me- for trp injections- and f/u on SCI/cauda equina syndrome  10.  Suggest theracane- 2-4 minutes on each pressure point- no massage- youtube has great videos on how to use.

## 2024-12-06 ENCOUNTER — Emergency Department (HOSPITAL_BASED_OUTPATIENT_CLINIC_OR_DEPARTMENT_OTHER)
Admission: EM | Admit: 2024-12-06 | Discharge: 2024-12-06 | Disposition: A | Discharge status: Home / Self Care | Attending: Emergency Medicine | Admitting: Emergency Medicine

## 2024-12-06 ENCOUNTER — Emergency Department (HOSPITAL_BASED_OUTPATIENT_CLINIC_OR_DEPARTMENT_OTHER)

## 2024-12-06 ENCOUNTER — Other Ambulatory Visit: Payer: Self-pay

## 2024-12-06 ENCOUNTER — Encounter (HOSPITAL_BASED_OUTPATIENT_CLINIC_OR_DEPARTMENT_OTHER): Payer: Self-pay

## 2024-12-06 DIAGNOSIS — J181 Lobar pneumonia, unspecified organism: Secondary | ICD-10-CM | POA: Diagnosis not present

## 2024-12-06 DIAGNOSIS — R0789 Other chest pain: Secondary | ICD-10-CM | POA: Diagnosis present

## 2024-12-06 DIAGNOSIS — J189 Pneumonia, unspecified organism: Secondary | ICD-10-CM

## 2024-12-06 LAB — TROPONIN T, HIGH SENSITIVITY
Troponin T High Sensitivity: 26 ng/L — ABNORMAL HIGH (ref 0–19)
Troponin T High Sensitivity: 25 ng/L — ABNORMAL HIGH (ref 0–19)

## 2024-12-06 LAB — BASIC METABOLIC PANEL WITH GFR
Anion gap: 9 (ref 5–15)
BUN: 14 mg/dL (ref 8–23)
CO2: 32 mmol/L (ref 22–32)
Calcium: 9.3 mg/dL (ref 8.9–10.3)
Chloride: 101 mmol/L (ref 98–111)
Creatinine, Ser: 0.82 mg/dL (ref 0.61–1.24)
GFR, Estimated: >60 mL/min
Glucose, Bld: 128 mg/dL — ABNORMAL HIGH (ref 70–99)
Potassium: 3.6 mmol/L (ref 3.5–5.1)
Sodium: 142 mmol/L (ref 135–145)

## 2024-12-06 LAB — HEPATIC FUNCTION PANEL
ALT: 13 U/L (ref 0–44)
AST: 15 U/L (ref 15–41)
Albumin: 3.7 g/dL (ref 3.5–5.0)
Alkaline Phosphatase: 71 U/L (ref 38–126)
Bilirubin, Direct: 0.1 mg/dL (ref 0.0–0.2)
Indirect Bilirubin: 0.2 mg/dL — ABNORMAL LOW (ref 0.3–0.9)
Total Bilirubin: 0.3 mg/dL (ref 0.0–1.2)
Total Protein: 5.8 g/dL — ABNORMAL LOW (ref 6.5–8.1)

## 2024-12-06 LAB — CBC
HCT: 39.5 % (ref 39.0–52.0)
Hemoglobin: 13.1 g/dL (ref 13.0–17.0)
MCH: 30.4 pg (ref 26.0–34.0)
MCHC: 33.2 g/dL (ref 30.0–36.0)
MCV: 91.6 fL (ref 80.0–100.0)
Platelets: 415 K/uL — ABNORMAL HIGH (ref 150–400)
RBC: 4.31 MIL/uL (ref 4.22–5.81)
RDW: 12.8 % (ref 11.5–15.5)
WBC: 6 K/uL (ref 4.0–10.5)
nRBC: 0 % (ref 0.0–0.2)

## 2024-12-06 LAB — MAGNESIUM: Magnesium: 2.3 mg/dL (ref 1.7–2.4)

## 2024-12-06 LAB — URINALYSIS, ROUTINE W REFLEX MICROSCOPIC
Bilirubin Urine: NEGATIVE
Glucose, UA: NEGATIVE mg/dL
Ketones, ur: NEGATIVE mg/dL
Leukocytes,Ua: NEGATIVE
Nitrite: NEGATIVE
Protein, ur: NEGATIVE mg/dL
Specific Gravity, Urine: 1.015 (ref 1.005–1.030)
pH: 7 (ref 5.0–8.0)

## 2024-12-06 LAB — PHOSPHORUS: Phosphorus: 3.5 mg/dL (ref 2.5–4.6)

## 2024-12-06 LAB — RESP PANEL BY RT-PCR (RSV, FLU A&B, COVID)  RVPGX2
Influenza A by PCR: NEGATIVE
Influenza B by PCR: NEGATIVE
Resp Syncytial Virus by PCR: NEGATIVE
SARS Coronavirus 2 by RT PCR: NEGATIVE

## 2024-12-06 LAB — URINALYSIS, MICROSCOPIC (REFLEX): WBC, UA: NONE SEEN WBC/hpf (ref 0–5)

## 2024-12-06 LAB — LIPASE, BLOOD: Lipase: <10 U/L — ABNORMAL LOW (ref 11–51)

## 2024-12-06 MED ORDER — SODIUM CHLORIDE 0.9 % IV SOLN
12.5000 mg | Freq: Once | INTRAVENOUS | Status: AC
  Administered 2024-12-06: 12.5 mg via INTRAVENOUS
  Filled 2024-12-06: qty 0.5

## 2024-12-06 MED ORDER — IOHEXOL 350 MG/ML SOLN
100.0000 mL | Freq: Once | INTRAVENOUS | Status: AC | PRN
  Administered 2024-12-06: 80 mL via INTRAVENOUS

## 2024-12-06 MED ORDER — AZITHROMYCIN 250 MG PO TABS: 500.0000 mg | ORAL_TABLET | Freq: Once | ORAL | Status: DC

## 2024-12-06 MED ORDER — AMOXICILLIN-POT CLAVULANATE 875-125 MG PO TABS
1.0000 | ORAL_TABLET | Freq: Once | ORAL | Status: AC
  Administered 2024-12-06: 1 via ORAL
  Filled 2024-12-06: qty 1

## 2024-12-06 MED ORDER — LACTATED RINGERS IV SOLN: INTRAVENOUS | Status: DC

## 2024-12-06 MED ORDER — PROMETHAZINE HCL 25 MG/ML IJ SOLN
INTRAMUSCULAR | Status: AC
  Filled 2024-12-06: qty 1

## 2024-12-06 MED ORDER — AMOXICILLIN-POT CLAVULANATE 875-125 MG PO TABS: 1.0000 | ORAL_TABLET | Freq: Two times a day (BID) | ORAL | 0 refills | Status: AC

## 2024-12-06 NOTE — ED Notes (Signed)

## 2024-12-06 NOTE — ED Provider Notes (Signed)
 " [facility] Provider Note   CSN: [id number] Arrival date & time: 12/06/24  1737     Patient presents with: Chest Pain   Louis Jordan Louis Jordan is a 68 y.o. male.   HPI Patient woke up this morning feeling nauseated.  He reports that he went to the kitchen to make himself something to eat and coughed once or twice and noticed that he coughed up a little bit of blood.  Patient reports he felt more nauseated and then start getting some pressure in his chest.  He reports he started to feel kind of hard to take a deep breath.  Patient reports that he has a history of a partial lung resection that turned out to be nonmalignant.  So, he had no cancer diagnosis or chemotherapy.  Patient reports however after the resection he went on to have a pneumothorax and bilateral pneumonia with prolonged hospitalization.  He reports since then he has been concerned about developing pneumonia and problems with his lungs.  No fevers.  Patient has a suprapubic catheter.  He reports that he was treated for a UTI last week and just finished doxycycline  2 days ago.    Prior to Admission medications  Medication Sig Start Date End Date Taking? Authorizing Provider  amoxicillin -clavulanate (AUGMENTIN ) 875-125 MG tablet Take 1 tablet by mouth every 12 (twelve) hours. 12/06/24  Yes Armenta Canning, MD  bisacodyl  (DULCOLAX) 10 MG suppository Place 1 suppository (10 mg total) rectally daily after supper. 11/08/19   Love, Sharlet RAMAN, PA-C  dicyclomine (BENTYL) 20 MG tablet Take by mouth. 01/06/22   [provider]  docusate sodium  (COLACE) 100 MG capsule Take 1 capsule (100 mg total) by mouth 2 (two) times daily. 11/08/19   Love, Sharlet RAMAN, PA-C  fluconazole  (DIFLUCAN ) 100 MG tablet Take 200 mg x 1 day then 100 mg daily- x 6 days- 03/18/23   Lovorn, Megan, MD  gabapentin  (NEURONTIN ) 600 MG tablet TAKE 2 TABLETS(1200 MG) BY MOUTH THREE TIMES DAILY 09/19/24   Lovorn, Duwaine, MD   hydrocortisone  (ANUSOL -HC) 2.5 % rectal cream Place rectally 3 (three) times daily. 11/08/19   Love, Sharlet RAMAN, PA-C  methadone  (DOLOPHINE ) 10 MG tablet Take 1 tablet (10 mg total) by mouth every 8 (eight) hours. 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 For nerve pain- chronic pain.  28 days after last Rx can fill 11/21/24   Lovorn, Megan, MD  naloxone  (NARCAN ) nasal spray 4 mg/0.1 mL Use if needed if gets too much pain meds/ confused, too sedated. 07/30/20   Lovorn, Megan, MD  nystatin  (MYCOSTATIN ) 100000 UNIT/ML suspension Take 5 mLs (500,000 Units total) by mouth 4 (four) times daily. 4x/day til gone and then 1x/day- if needed 12/17/22   Lovorn, Megan, MD  Pancrelipase, Lip-Prot-Amyl, (CREON) 24000-76000 units CPEP Take 1 capsule by mouth 3 (three) times daily with meals. 08/06/22   [provider]  polyethylene glycol powder (MIRALAX ) 17 GM/SCOOP powder Please take 6 capfuls of MiraLAX  in a 32 oz bottle of Gatorade over 2-4 hour period. The following day take 3 capfuls. On day 3 start taking 1 capful 3 times a day. Slowly cut back as needed until you have normal bowel movements. Patient taking differently: Take 1 Container by mouth in the morning and at bedtime. 03/03/20  Trine Raynell Moder, MD  promethazine  (PHENERGAN ) 12.5 MG tablet Take 1 tablet (12.5 mg total) by mouth every 6 (six) hours as needed for nausea or vomiting. 07/20/24   Lovorn, Megan, MD  senna (SENOKOT) 8.6 MG TABS tablet Take 1 tablet (8.6 mg total) by mouth 2 (two) times daily. 11/08/19   Love, Sharlet RAMAN, PA-C  sertraline  (ZOLOFT ) 100 MG tablet Take 1.5 tablets (150 mg total) by mouth daily. TAKE 1 AND 1/2 TABLETS(150 MG) BY MOUTH DAILY 07/20/24   Lovorn, Megan, MD  sildenafil  (VIAGRA ) 100 MG tablet Take 1 tablet (100 mg total) by mouth as needed for erectile dysfunction.  11/25/21   Lovorn, Megan, MD  silver sulfADIAZINE (SILVADENE) 1 % cream Apply topically 2 (two) times daily. 07/01/22   [provider]  tadalafil (CIALIS) 20 MG tablet Take by mouth. 02/04/22   [provider]  tamsulosin  (FLOMAX ) 0.4 MG CAPS capsule TAKE 1 CAPSULE(0.4 MG) BY MOUTH TWICE DAILY AS NEEDED 12/16/22   [provider]  testosterone cypionate (DEPOTESTOSTERONE CYPIONATE) 200 MG/ML injection SMARTSIG:Milliliter(s) IM 06/30/23   [provider]  triamcinolone cream (KENALOG) 0.1 % Apply topically. 07/01/22   [provider]    Allergies: Fentanyl , Levetiracetam , Penicillins, and Sulfamethoxazole-trimethoprim    Review of Systems  Updated Vital Signs BP (!) 146/72   Pulse (!) 53   Temp 98.3 F (36.8 C) (Oral)   Resp 11   Ht 6' (1.829 m)   Wt 63.5 kg   SpO2 93%   BMI 18.99 kg/m   Physical Exam Constitutional:      Comments: Alert.  No respiratory distress.  Thin and deconditioned appearance.  Slightly pale.  HENT:     Head: Normocephalic and atraumatic.     Mouth/Throat:     Pharynx: Oropharynx is clear.  Eyes:     Extraocular Movements: Extraocular movements intact.  Cardiovascular:     Rate and Rhythm: Normal rate and regular rhythm.  Pulmonary:     Effort: Pulmonary effort is normal.     Comments: Lungs are grossly clear.  No notable deficit of breath sounds. Abdominal:     General: There is no distension.     Palpations: Abdomen is soft.     Tenderness: There is no abdominal tenderness. There is no guarding.  Musculoskeletal:        General: No swelling or tenderness. Normal range of motion.     Right lower leg: No edema.     Left lower leg: No edema.  Skin:    General: Skin is warm and dry.  Neurological:     Mental Status: He is oriented to person, place, and time.     (all labs ordered are listed, but only abnormal results are displayed) Labs Reviewed  BASIC METABOLIC PANEL WITH GFR - Abnormal; Notable for  the following components:      Result Value   Glucose, Bld 128 (*)    All other components within normal limits  CBC - Abnormal; Notable for the following components:   Platelets 415 (*)    All other components within normal limits  URINALYSIS, ROUTINE W REFLEX MICROSCOPIC - Abnormal; Notable for the following components:   Hgb urine dipstick LARGE (*)    All other components within normal limits  HEPATIC FUNCTION PANEL - Abnormal; Notable for the following components:   Total Protein 5.8 (*)    Indirect Bilirubin 0.2 (*)    All other components within normal limits  LIPASE, BLOOD - Abnormal; Notable  for the following components:   Lipase <10 (*)    All other components within normal limits  URINALYSIS, MICROSCOPIC (REFLEX) - Abnormal; Notable for the following components:   Bacteria, UA RARE (*)    All other components within normal limits  TROPONIN T, HIGH SENSITIVITY - Abnormal; Notable for the following components:   Troponin T High Sensitivity 26 (*)    All other components within normal limits  TROPONIN T, HIGH SENSITIVITY - Abnormal; Notable for the following components:   Troponin T High Sensitivity 25 (*)    All other components within normal limits  RESP PANEL BY RT-PCR (RSV, FLU A&B, COVID)  [id number]  MAGNESIUM  PHOSPHORUS    EKG: EKG Interpretation Date/Time:  Tuesday December 06 2024 17:46:44 EST Ventricular Rate:  65 PR Interval:  139 QRS Duration:  145 QT Interval:  456 QTC Calculation: 475 R Axis:   91  Text Interpretation: Sinus rhythm Multiple ventricular premature complexes 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 Multiplanar reformatted images are provided for review.  MIP images are provided for review. Automated exposure control, iterative reconstruction, and/or weight based adjustment of the mA/kV was utilized to reduce the radiation dose to as low as reasonably achievable. COMPARISON: Same day x-ray and study dated 02/04/2024. CLINICAL HISTORY: Pulmonary embolism (PE) suspected, high prob. FINDINGS: PULMONARY ARTERIES: Pulmonary arteries are adequately opacified for evaluation. Negative for pulmonary embolism. Main pulmonary artery is normal in caliber. MEDIASTINUM: Cardiomegaly. Coronary artery atherosclerotic calcifications. Aortic atherosclerotic calcifications. 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 There is no acute abnormality of the thoracic aorta. The pericardium demonstrates no acute abnormality. LYMPH NODES: No mediastinal, hilar or axillary lymphadenopathy. LUNGS AND PLEURA: Postsurgical changes in the right upper lobe with associated scarring and bronchiolectasis. There are patchy ground-glass opacities along the scar suspected to represent pneumonia. No evidence of pleural effusion or pneumothorax. UPPER ABDOMEN: Limited images of the upper abdomen are unremarkable. SOFT TISSUES AND BONES: Posterior fusion lower thoracic spine and anterior fusion lower cervical spine. No acute bone or soft tissue abnormality. IMPRESSION: 1. No pulmonary embolism. 2. Patchy ground-glass opacities along the right upper lobe scar suspicious for pneumonia. Follow up after treatment is recommended to ensure resolution. 3. 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 Recommend annual imaging followup by CTA or MRA. This recommendation follows 2010 ACCF/AHA/AATS/ACR/ASA/SCA/SCAI/SIR/STS/SVM Guidelines for the Diagnosis and Management of Patients with Thoracic Aortic Disease. Circulation. 2010; 121: Z733-z630. Aortic aneurysm NOS (ICD10-I71.9) Electronically signed by: Norman Gatlin MD 12/06/2024 10:27 PM EST RP Workstation: [id number]   DG Chest 2 View Result Date:  12/06/2024 EXAM: 2 VIEW(S) XRAY OF THE CHEST 12/06/2024 07:48:00 PM COMPARISON: PA and lateral chest 03/28/2024. CLINICAL HISTORY: Chest pain and shortness of breath. FINDINGS: LUNGS AND PLEURA: Postsurgical changes and volume loss are present chronically in the right upper lobe. Subtle increased hazy opacity is noted in the right upper lobe just above the horizontal fissure concerning for pneumonia. The remaining lungs are clear. The lungs are slightly emphysematous. No pleural effusion. No pneumothorax. HEART AND MEDIASTINUM: The cardiac size is normal. Stable mediastinum with aortic atherosclerosis and tortuosity. BONES AND SOFT TISSUES: Fusion hardware is again noted in the lower cervical, and lower thoracic/lumbar spine both only  partially visualized. Osteopenia and degenerative change. IMPRESSION: 1. Subtle increased hazy opacity in the right upper lobe just above the horizontal fissure, concerning for pneumonia. Follow-up study recommended to ensure clearance. 2. Postsurgical changes and chronic volume loss in the right upper lobe. Otherwise stable COPD Chest. Electronically signed by: Francis Quam MD 12/06/2024 08:50 PM EST RP Workstation: [id number]     Procedures   Medications Ordered in the ED  promethazine  (PHENERGAN ) 12.5 mg in sodium chloride  0.9 % 50 mL IVPB (0 mg Intravenous Stopped 12/06/24 2237)  iohexol  (OMNIPAQUE ) 350 MG/ML injection 100 mL (80 mLs Intravenous Contrast Given 12/06/24 2207)  amoxicillin -clavulanate (AUGMENTIN ) 875-125 MG per tablet 1 tablet (1 tablet Oral Given 12/06/24 2343)                                    Medical Decision Making Amount and/or Complexity of Data Reviewed Labs: ordered. Radiology: ordered.  Risk Prescription drug management.   Patient presents as outlined.  He does have a complex medical history of prolonged hospitalization from a double pneumonia and apparent post procedural pneumothorax.  Will proceed with broad diagnostic  evaluation.  At this time patient is nontoxic.  Blood pressures are normal, heart rate is regular and in the 60s, patient is afebrile.  At this time I do not see indication to start sepsis protocol.  Will initiate IV fluids and continue with diagnostic evaluation.  Chest x-ray shows some questionable area of increased infiltrate area scarring in the right upper lobe.  Labs otherwise within normal limits.  Blood pressure is remaining stable.  Patient is afebrile.  With combination of patient's symptoms of chest pain, report of an episode of hemoptysis and equivocal finding of chest x-ray will proceed with CT chest with contrast to rule out PE or occult pneumonia.  CT chest interpreted by radiology small amount of possible early pneumonia around some scarring in the right upper lobe.  No PE present.  Patient has remained stable.  Troponins are negative.  No signs of ACS.  PE is ruled out.  Patient reports he has had pneumonia started insidiously without typical symptoms and then ended up extremely ill.  At this time we will opt to initiate antibiotics.  We discussed the use of Augmentin .  Patient reports his allergy to amoxicillin  was nausea.  He is agreeable to trying a course of Augmentin .  Follow-up plan and return precautions reviewed.     Final diagnoses:  Pneumonia of right upper lobe due to infectious organism    ED Discharge Orders          Ordered    amoxicillin -clavulanate (AUGMENTIN ) 875-125 MG tablet  Every 12 hours        12/06/24 2318               Armenta Canning, MD 12/07/24 1543  "

## 2024-12-06 NOTE — ED Notes (Signed)
Pt. Gone to CT 

## 2024-12-06 NOTE — Discharge Instructions (Addendum)
 1.  Start taking amoxicillin  as prescribed. 2.  Follow-up with your doctor for recheck as soon as possible 3.  Return to the emergency department if you have new or worsening symptoms.

## 2024-12-06 NOTE — ED Triage Notes (Addendum)
 Pt reports he woke up this morning feeling nauseous and coughed up blood. He also reports feeling pressure in the middle of his chest that started today around 11am, associated with shortness of breath. He presents with a suprapubic catheter due to a previous spinal cord injury 5 years ago. Hx of lung cancer and resection in February.

## 2024-12-06 NOTE — ED Notes (Signed)
 Pt. Is complaining of nausea

## 2024-12-06 NOTE — ED Notes (Signed)
 Pt. Reports he felt nausea around 8am and then he felt he could not get a good deep breath.  Pt. States he then felt pressure on his chest.  Pt. Speaks clear sentences.  Pt. Has a supra pubic cath.  Pt. Is able to ambulate but has a spinal cord injury.  Pt. Is A&O with no distress noted and has chronic pain.

## 2025-01-09 ENCOUNTER — Ambulatory Visit: Admitting: Podiatry

## 2025-01-11 ENCOUNTER — Ambulatory Visit: Admitting: Podiatry

## 2025-01-11 ENCOUNTER — Telehealth: Payer: Self-pay

## 2025-01-11 NOTE — Telephone Encounter (Signed)
 PA request for Methadone  HCL 10 MG received. Per pharmacy PA note needed. [employer] paid for the script. ([employer] is [employer])

## 2025-01-11 NOTE — Telephone Encounter (Signed)
 error

## 2025-01-23 ENCOUNTER — Ambulatory Visit: Admitting: Physical Medicine and Rehabilitation

## 2025-03-10 ENCOUNTER — Encounter: Admitting: Physical Medicine and Rehabilitation

## 2025-03-20 ENCOUNTER — Encounter: Admitting: Physical Medicine and Rehabilitation

## 2025-04-24 ENCOUNTER — Encounter: Attending: Physical Medicine and Rehabilitation | Admitting: Physical Medicine and Rehabilitation
